# Patient Record
Sex: Female | Born: 1940 | Race: White | Hispanic: No | Smoking: Former smoker
Health system: Southern US, Community
[De-identification: ages and names within clinical notes are randomized; demographics above are authoritative.]

## PROBLEM LIST (undated history)

## (undated) DIAGNOSIS — E039 Hypothyroidism, unspecified: Secondary | ICD-10-CM

## (undated) DIAGNOSIS — C069 Malignant neoplasm of mouth, unspecified: Secondary | ICD-10-CM

## (undated) DIAGNOSIS — J449 Chronic obstructive pulmonary disease, unspecified: Secondary | ICD-10-CM

## (undated) DIAGNOSIS — L409 Psoriasis, unspecified: Secondary | ICD-10-CM

## (undated) DIAGNOSIS — E049 Nontoxic goiter, unspecified: Secondary | ICD-10-CM

## (undated) DIAGNOSIS — I10 Essential (primary) hypertension: Secondary | ICD-10-CM

## (undated) DIAGNOSIS — I350 Nonrheumatic aortic (valve) stenosis: Secondary | ICD-10-CM

## (undated) DIAGNOSIS — C801 Malignant (primary) neoplasm, unspecified: Secondary | ICD-10-CM

## (undated) DIAGNOSIS — M419 Scoliosis, unspecified: Secondary | ICD-10-CM

## (undated) DIAGNOSIS — Z973 Presence of spectacles and contact lenses: Secondary | ICD-10-CM

## (undated) DIAGNOSIS — R413 Other amnesia: Secondary | ICD-10-CM

## (undated) DIAGNOSIS — E079 Disorder of thyroid, unspecified: Secondary | ICD-10-CM

## (undated) HISTORY — DX: Psoriasis, unspecified: L40.9

## (undated) HISTORY — DX: Nontoxic goiter, unspecified: E04.9

## (undated) HISTORY — DX: Chronic obstructive pulmonary disease, unspecified: J44.9

## (undated) HISTORY — DX: Other amnesia: R41.3

## (undated) HISTORY — DX: Hypothyroidism, unspecified: E03.9

## (undated) HISTORY — DX: Disorder of thyroid, unspecified: E07.9

## (undated) HISTORY — DX: Scoliosis, unspecified: M41.9

## (undated) HISTORY — DX: Presence of spectacles and contact lenses: Z97.3

## (undated) HISTORY — PX: OTHER SURGICAL HISTORY: SHX169

## (undated) HISTORY — DX: Nonrheumatic aortic (valve) stenosis: I35.0

## (undated) HISTORY — DX: Malignant (primary) neoplasm, unspecified: C80.1

## (undated) HISTORY — DX: Essential (primary) hypertension: I10

## (undated) HISTORY — DX: Malignant neoplasm of mouth, unspecified: C06.9

---

## 1994-07-31 HISTORY — PX: MOUTH SURGERY: SHX715

## 1997-11-09 ENCOUNTER — Ambulatory Visit (HOSPITAL_COMMUNITY): Admission: RE | Admit: 1997-11-09 | Discharge: 1997-11-09 | Payer: Self-pay | Admitting: Otolaryngology

## 1999-05-18 ENCOUNTER — Other Ambulatory Visit: Admission: RE | Admit: 1999-05-18 | Discharge: 1999-05-18 | Payer: Self-pay | Admitting: Internal Medicine

## 2001-02-25 ENCOUNTER — Encounter (INDEPENDENT_AMBULATORY_CARE_PROVIDER_SITE_OTHER): Payer: Self-pay | Admitting: *Deleted

## 2001-02-25 ENCOUNTER — Ambulatory Visit (HOSPITAL_BASED_OUTPATIENT_CLINIC_OR_DEPARTMENT_OTHER): Admission: RE | Admit: 2001-02-25 | Discharge: 2001-02-25 | Payer: Self-pay | Admitting: Otolaryngology

## 2003-09-28 ENCOUNTER — Other Ambulatory Visit: Admission: RE | Admit: 2003-09-28 | Discharge: 2003-09-28 | Payer: Self-pay | Admitting: Internal Medicine

## 2003-11-30 ENCOUNTER — Ambulatory Visit (HOSPITAL_COMMUNITY): Admission: RE | Admit: 2003-11-30 | Discharge: 2003-11-30 | Payer: Self-pay | Admitting: *Deleted

## 2004-12-28 ENCOUNTER — Emergency Department (HOSPITAL_COMMUNITY): Admission: EM | Admit: 2004-12-28 | Discharge: 2004-12-29 | Payer: Self-pay | Admitting: Emergency Medicine

## 2005-01-01 ENCOUNTER — Inpatient Hospital Stay (HOSPITAL_COMMUNITY): Admission: AD | Admit: 2005-01-01 | Discharge: 2005-01-03 | Payer: Self-pay | Admitting: Gastroenterology

## 2006-11-08 ENCOUNTER — Other Ambulatory Visit: Admission: RE | Admit: 2006-11-08 | Discharge: 2006-11-08 | Payer: Self-pay | Admitting: Internal Medicine

## 2008-04-10 ENCOUNTER — Encounter: Admission: RE | Admit: 2008-04-10 | Discharge: 2008-04-10 | Payer: Self-pay | Admitting: Internal Medicine

## 2008-11-06 ENCOUNTER — Encounter: Admission: RE | Admit: 2008-11-06 | Discharge: 2008-11-06 | Payer: Self-pay | Admitting: Internal Medicine

## 2009-11-03 ENCOUNTER — Encounter: Admission: RE | Admit: 2009-11-03 | Discharge: 2009-11-03 | Payer: Self-pay | Admitting: Internal Medicine

## 2009-12-23 ENCOUNTER — Encounter: Admission: RE | Admit: 2009-12-23 | Discharge: 2009-12-23 | Payer: Self-pay | Admitting: Endocrinology

## 2009-12-23 ENCOUNTER — Other Ambulatory Visit: Admission: RE | Admit: 2009-12-23 | Discharge: 2009-12-23 | Payer: Self-pay | Admitting: Interventional Radiology

## 2010-06-02 ENCOUNTER — Other Ambulatory Visit: Admission: RE | Admit: 2010-06-02 | Discharge: 2010-06-02 | Payer: Self-pay | Admitting: Internal Medicine

## 2010-08-17 ENCOUNTER — Ambulatory Visit
Admission: RE | Admit: 2010-08-17 | Discharge: 2010-08-17 | Payer: Self-pay | Source: Home / Self Care | Attending: Specialist | Admitting: Specialist

## 2010-08-22 LAB — POCT I-STAT 4, (NA,K, GLUC, HGB,HCT)
Glucose, Bld: 105 mg/dL — ABNORMAL HIGH (ref 70–99)
Sodium: 142 mEq/L (ref 135–145)

## 2010-09-22 ENCOUNTER — Other Ambulatory Visit: Payer: Self-pay | Admitting: General Surgery

## 2010-09-22 DIAGNOSIS — E041 Nontoxic single thyroid nodule: Secondary | ICD-10-CM

## 2010-11-16 ENCOUNTER — Ambulatory Visit (HOSPITAL_BASED_OUTPATIENT_CLINIC_OR_DEPARTMENT_OTHER)
Admission: RE | Admit: 2010-11-16 | Discharge: 2010-11-16 | Disposition: A | Discharge status: Home / Self Care | Payer: 59 | Source: Ambulatory Visit | Attending: Specialist | Admitting: Specialist

## 2010-11-16 DIAGNOSIS — H52209 Unspecified astigmatism, unspecified eye: Secondary | ICD-10-CM | POA: Insufficient documentation

## 2010-11-16 DIAGNOSIS — H269 Unspecified cataract: Secondary | ICD-10-CM | POA: Insufficient documentation

## 2010-11-30 ENCOUNTER — Ambulatory Visit
Admission: RE | Admit: 2010-11-30 | Discharge: 2010-11-30 | Disposition: A | Discharge status: Home / Self Care | Payer: 59 | Source: Ambulatory Visit | Attending: General Surgery | Admitting: General Surgery

## 2010-11-30 DIAGNOSIS — E041 Nontoxic single thyroid nodule: Secondary | ICD-10-CM

## 2010-12-16 NOTE — Op Note (Signed)
[place]  Patient:    [name], [name]                          MRN: [id number] Proc. Date: 02/25/01 Attending:  Kristine Garbe. Ezzard Standing, M.D. CC:         Soyla Murphy. Renne Crigler, M.D.   Operative Report  PREOPERATIVE DIAGNOSIS:  Midline neck node.  POSTOPERATIVE DIAGNOSIS:  Midline neck node.  OPERATION PERFORMED:  Excision of lower midline neck node.  SURGEON:  Kristine Garbe. Ezzard Standing, M.D.  ANESTHESIA:  Local 1% Xylocaine with 1:200,000 epinephrine.  COMPLICATIONS:  None.  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  She has recently developed a nodule in the midline neck consistent with probable thyroid lymph node as it was inferior to the thyroid isthmus.  She is taken to the operating room at this time for excisional biopsy of the lower neck node. She has no other significant palpable adenopathy in the neck although she does have a small nodule at the thyroid isthmus.  DESCRIPTION OF PROCEDURE:  The neck was prepped with Betadine solution and the area of incision was injected with 3 cc of 1% Xylocaine with 1:200,000 epinephrine.  A 2 cm incision was made and the lymph node was deep to the platysmus muscle which was transected.  The node was then dissected out.  The blood supply was clamped, ligated with a 4-0 chromic suture.  The lymph node was incised and sent to pathology in saline.  The wound was then closed with 4-0 chromic suture subcutaneously and Steri-Strips on the skin.  The patient tolerated the procedure well.  DISPOSITION:  The patient will be discharged home later this morning and will follow up in my office on Friday to review pathology and recheck wound. DD:  02/25/01 TD:  02/25/01 Job: [id number]

## 2010-12-16 NOTE — Discharge Summary (Signed)
NAMEMINDA, FAAS NO.:  [id number]   MEDICAL RECORD NO.:  [id number]          PATIENT TYPE:  INP   LOCATION:  3008                         FACILITY:  [facility]   PHYSICIAN:  Georgiana Spinner, M.D.    DATE OF BIRTH:  [birth year]   DATE OF ADMISSION:  01/01/2005  DATE OF DISCHARGE:  01/03/2005                                 DISCHARGE SUMMARY   Ms. Sculley is a 70 year old lady admitted January 01, 2005, being discharged January 03, 2005.  Ms. Cocco is a very pleasant 70 year old lady who was admitted  with diarrhea.  The patient had diarrhea for approximately one week prior to  admission.  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  The diarrhea started  after she had eaten a meal out at a [employer].  She developed large-  volume diarrhea without hematochezia or pain.  She went to Urgent Care and  found to be orthostatic.  She was moved to the emergency room at [facility],  hydrated and discharged, and symptoms persisted and then she became  dehydrated once again and was admitted.   FAMILY HISTORY:  Noncontributory.   She no longer uses tobacco.   REVIEW OF SYSTEMS:  Unremarkable.   MEDICATIONS ON ADMISSION:  Synthroid, Azopt, multivitamins and fish oil.   She has no known drug allergies.   Her examination was unremarkable.  Abdomen was flat and soft and nontender  and nondistended.  She was treated with IV hydration.  She had been given  Imodium as an outpatient but not in the hospital but had no further diarrhea  after being admitted and no fever, chills or night sweats were noted, and  she was able to tolerate a regular diet without further diarrhea.  She had  flatus.  Abdomen remained soft, nontender, nondistended, and laboratory  studies showed at the time of discharge that CMP showed potassium was at  3.3, which was only slightly low given all her diarrhea.  SGOT was 51,  normal being up to  37.  Albumin was 2.9.  CBC showed a hematocrit of 37.5,  hemoglobin 12.7, white count 3500, with essentially a normal differential.  Platelet count was normal.  No radiologic studies were done at this time.  The patient is discharged in improved condition to be followed up on a  p.r.n. basis.      GMO/MEDQ  D:  01/03/2005  T:  01/03/2005  Job:  [id number]

## 2010-12-16 NOTE — Op Note (Signed)
NAMECARMELLE, BAMBERG                           ACCOUNT NO.:  [id number]   MEDICAL RECORD NO.:  [id number]                   PATIENT TYPE:  AMB   LOCATION:  ENDO                                 FACILITY:  [facility]   PHYSICIAN:  Georgiana Spinner, M.D.                 DATE OF BIRTH:  April 24, 1941   DATE OF PROCEDURE:  11/30/2003  DATE OF DISCHARGE:                                 OPERATIVE REPORT   PROCEDURE:  Colonoscopy.   INDICATION:  Colon cancer screening.   ANESTHESIA:  Demerol 70, Versed 7 mg.   PROCEDURE:  With the patient mildly sedated in the left lateral decubitus  position, an Olympus video colonoscope was inserted in the rectum, passed  under direct vision through a tortuous colon to the cecum, identified by  ileocecal valve and appendiceal orifice, both of which were photographed.  From this point, the colonoscope was slowly withdrawn, taking  circumferential views of the colonic mucosa, stopping only in the rectum  which appeared normal.  On direct, it showed internal hemorrhoids on  retroflexed view.  The endoscope was straightened and withdrawn.  The  patient's vital signs and pulse oximeter remained stable.  The patient  tolerated the procedure well with no apparent complications.   FINDINGS:  1. Some diverticulosis of sigmoid colon.  2. Internal hemorrhoids.  3. Otherwise unremarkable exam.   PLAN:  Repeat examination in 5-10 years.                                               Georgiana Spinner, M.D.    GMO/MEDQ  D:  11/30/2003  T:  11/30/2003  Job:  [id number]

## 2010-12-16 NOTE — H&P (Signed)
[name], [name]                 ACCOUNT NO.:  [id number]   MEDICAL RECORD NO.:  [id number]          PATIENT TYPE:  INP   LOCATION:  3008                         FACILITY:  [facility]   PHYSICIAN:  Jordan Hawks. Elnoria Howard, MD    DATE OF BIRTH:  03/03/1941   DATE OF ADMISSION:  01/01/2005  DATE OF DISCHARGE:                                HISTORY & PHYSICAL   REASON FOR ADMISSION:  Diarrhea, nausea, vomiting, and dehydration.   HISTORY OF PRESENT ILLNESS:  This is a 70 year old white female with a past  medical h o squamous cell carcinoma status post partial glossal resection,  hypothyroidism, and glaucoma, who was admitted for persistent diarrhea.  The  patient states that the diarrhea started acutely on Tuesday evening after  eating at [employer] with her son.  Incidentally, her son was not sick after  eating with his mother.  Subsequently the patient developed large-volume  diarrhea without hematochezia or pain, although the diarrhea persists even  if she fasts and will awaken her from sleep.  She was evaluated at [facility] on Wednesday and found to be extremely orthostatic with heart  rate of 122.  She was subsequently moved to the emergency room at [facility] and  hydrated.  After hydration, the patient was subsequently discharged home;  however, her symptoms never abated. She was also prescribed Reglan for  unknown reason at that brief visit.   The patient called today and was advised to be admitted, but she was  initially resistant with recommendation; however, despite her symptomatic  relief from the recommended Imodium, she felt weak and tired and felt to  require admission.  She denies any fevers, chills, foreign travel, sick  contacts, or recent antibiotic use.  Her prior colonoscopy by Dr. Virginia Rochester in  2005 was essentially negative per the patient.   PAST MEDICAL HISTORY AND PAST SURGICAL HISTORY:  As stated above.   FAMILY HISTORY:  Noncontributory.   SOCIAL HISTORY:  Prior tobacco  use.   REVIEW OF SYSTEMS:  As per the History of Present Illness.   MEDICATIONS:  Synthroid, Azoptic, multivitamins, and fish oil.   ALLERGIES:  No known drug allergies.   PHYSICAL EXAMINATION:  VITAL SIGNS:  Supine 120/80 with heart rate of 88;  standing after 2 minutes duration, 115/80 with heart rate at 106.  GENERAL:  The patient is in no acute distress, fatigued appearing.  HEENT:  Normocephalic and atraumatic.  Extraocular muscles intact.  Pupils  equal, round, and reactive to light.  NECK:  Supple, no lymphadenopathy.  LUNGS: Clear to auscultation bilaterally.  CARDIOVASCULAR:  Regular rate and rhythm, tachycardic.  Positive for 2/6  systolic ejection murmur.  ABDOMEN:  Flat, soft, nontender, nondistended.  No hepatosplenomegaly.  EXTREMITIES:  No clubbing, cyanosis, or edema.  RECTAL:  Heme negative and no palpable abnormality.   LABORATORY DATA:  Laboratory values are pending at this time   IMPRESSION:  Diarrhea of questionable etiology.  Per the patient's clinical  history, this could possibly be secretory in nature. She needs further  evaluation for possible infectious etiology.  PLAN:  1.  Aggressive IV hydration.  2.  Hold Imodium until stool studies are collected.  3.  Check for C. difficile, fecal leukocytes, stool potassium, stool sodium,      and stool osmolarity.  4.  Flexible sigmoidoscopy versus colonoscopy per Dr. Virginia Rochester for evaluation of      possible microscopic colitis.      PDH/MEDQ  D:  01/01/2005  T:  01/01/2005  Job:  [id number]   cc:   Georgiana Spinner, M.D.  [address]  [place]  [place] [address]  Fax: [phone]   Soyla Murphy. Renne Crigler, M.D.  [address]  [place]  [place] [address]  Fax: [phone]

## 2010-12-30 ENCOUNTER — Encounter (INDEPENDENT_AMBULATORY_CARE_PROVIDER_SITE_OTHER): Payer: Self-pay | Admitting: General Surgery

## 2011-02-24 NOTE — Op Note (Signed)
  NAMEKEYIRA, [name]                 ACCOUNT NO.:  [id number]  MEDICAL RECORD NO.:  [id number]          PATIENT TYPE:  LOCATION:                                 FACILITY:  PHYSICIAN:  Chucky May, M.D.  DATE OF BIRTH:  07-22-1941  DATE OF PROCEDURE:  11/16/2010 DATE OF DISCHARGE:                              OPERATIVE REPORT   PREOPERATIVE DIAGNOSES: 1. Cataract, left eye. 2. Astigmatism, left eye.  POSTOPERATIVE DIAGNOSES: 1. Cataract, left eye. 2. Astigmatism, left eye.  OPERATION PERFORMED:  Cataract extraction with intraocular lens implant with toric intraocular lens and limbal relaxing incision by LenSx laser, left eye.  SURGEON:  Chucky May, M.D.  ANESTHESIA:  MAC.  The outpatient setting is the appropriate setting for the surgical procedure.  DESCRIPTION OF PROCEDURE:  The patient was taken from the preoperative area and moved into position onto the LenSx laser.  Previously the 9 o'clock, 3 o'clock and 6 o'clock positions had been marked with a sterile marking pen from the cornea.  The patient was placed in the supine position.  There was no cyclotorsion noted.  The patient interface was placed over the eye and appropriate applanation obtained. A limbal relaxing incision of the 28 degrees arc was placed at axis 117 degrees.  Side port and primary incisions were additionally made by the laser.  A capsulorrhexis and emulsification of the nucleus of the lens was also performed by laser.  The patient was then taken to the main operating room where she was placed in the supine position.  Anesthesia was obtained by means of topical 4% lidocaine drops of tetracaine.  She was then prepped and draped in the usual manner.  A lid speculum was inserted and the cornea was entered through the previously made side port incision and viscoelastic was instilled to the anterior chamber. The primary incision was then opened and a capsulorrhexis flap made by the laser was  gently removed from the.  The nucleus was mobilized by hydrodissection followed by phacoemulsification of the nucleus and residual cortical material was removed by irrigation-aspiration.  The posterior capsule was polished.  Once the posterior capsule was polished, a posterior chamber lens implant model [id number] of 23.5 diopters was placed in the bag without difficulty.  The lens was then rotated into position so that its axis was aligned at 117 degrees.  Viscoelastic was then removed by irrigation and aspiration and replaced with Balanced Salt Solution.  The wounds were then hydrated with Balanced Salt Solution and checked for fluid leaks and none were noted.  The eye was dressed with topical Vigamox and Pred Forte as well as a Fox shield and the patient was taken to the recovery room in excellent condition where she received written and verbal instructions for postoperative care and was scheduled for followup in 24 hours.          ______________________________ Chucky May, M.D.     DJD/MEDQ  D:  11/16/2010  T:  11/17/2010  Job:  [id number]  Electronically Signed by Nelson Chimes M.D. on 02/24/2011 09:26:49 AM

## 2011-05-02 IMAGING — US US BIOPSY
1 series · 5 of 5 positions shown · non-contrast
Comparison: none

CLINICAL DATA: Patient with history of multiple thyroid nodules and
thyroid ultrasound at [HOSPITAL] on 11/03/2009 which
revealed a dominant nodule in the medial left mid pole measuring
1.6 x 0.6 x 0.7 cm.  A second dominant nodule in the right aspect
of the isthmus measures 1.4 x 0.5 x 1.0 cm. Request is now made for
needle aspirate biopsy of the above-mentioned dominant  thyroid
nodules.

ULTRASOUND-GUIDED NEEDLE ASPIRATE BIOPSY, DOMINANT RIGHT
THYROID/ISTHMUS NODULE
The above procedure was discussed with the patient and written
informed consent was obtained.

[Series 1: us biopsy · 5 acquisitions, 5 frames shown]
[im 1/5]
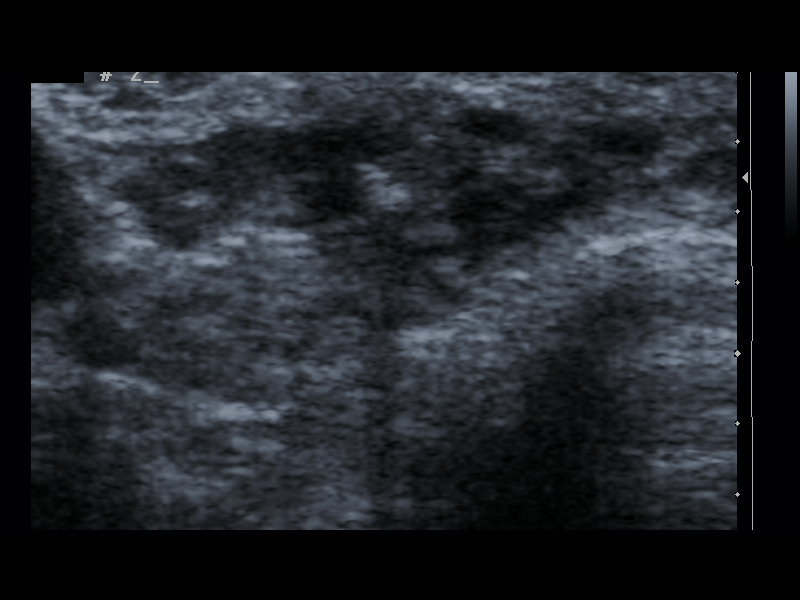
[im 2/5]
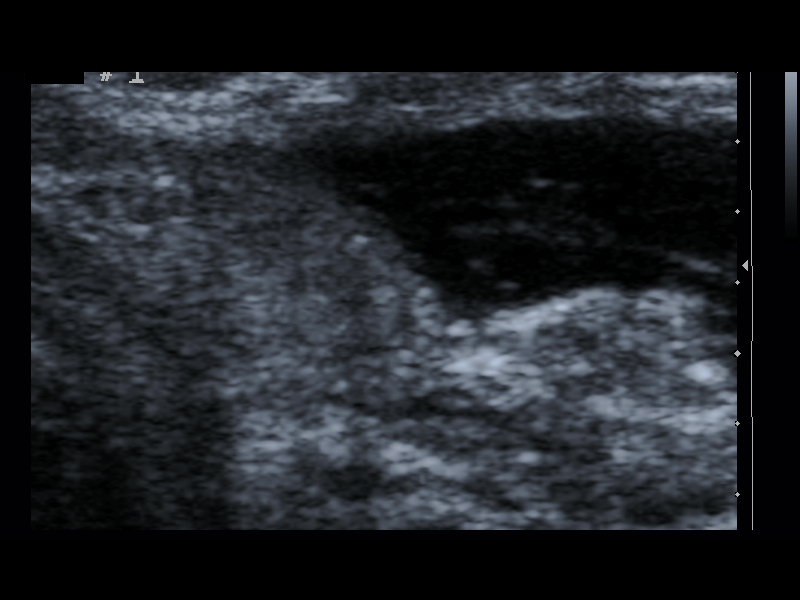
[im 3/5]
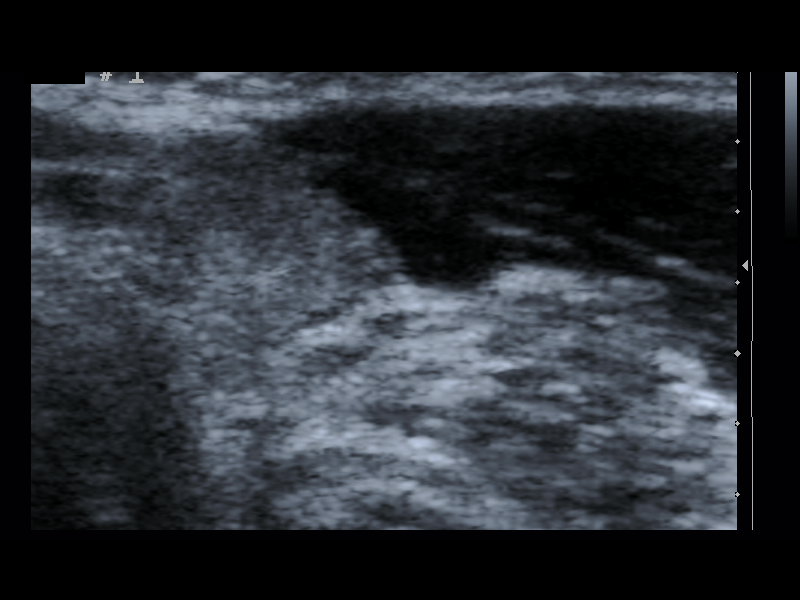
[im 4/5]
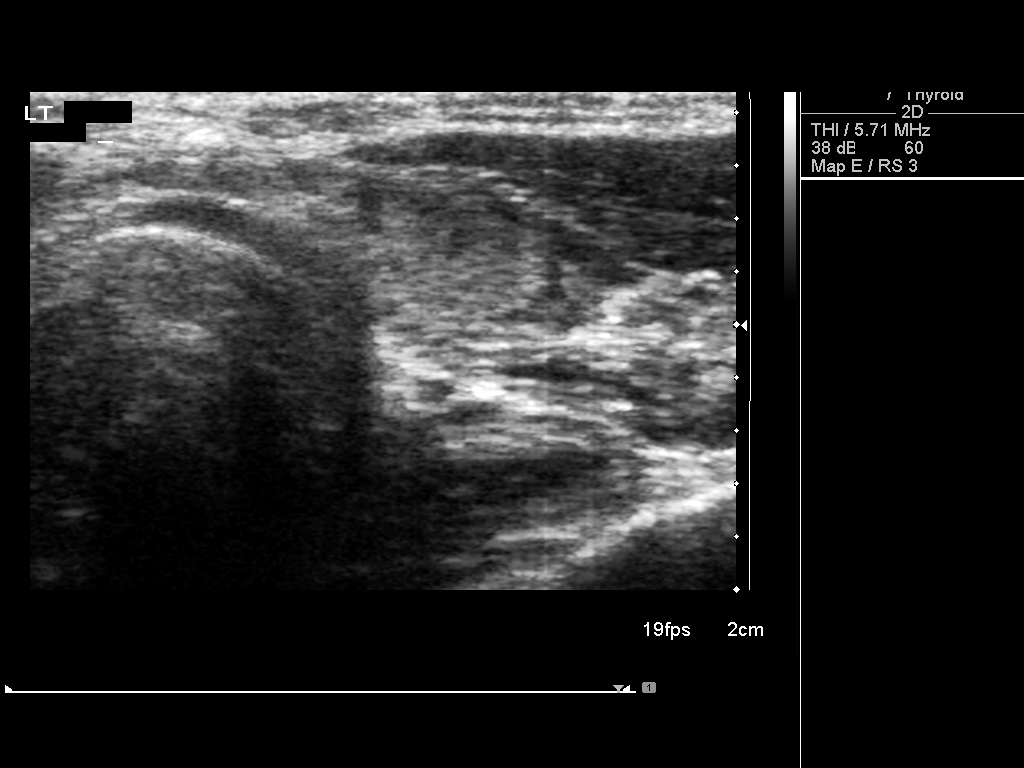
[im 5/5]
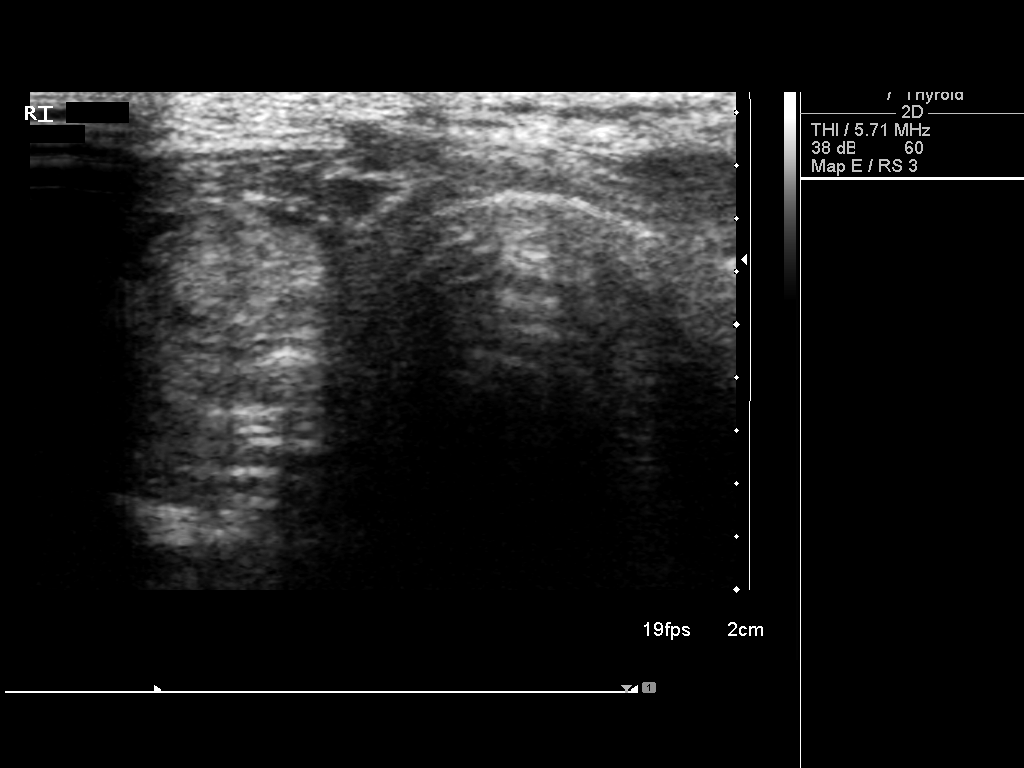

[5 of 5 positions shown; findings below may reference images not displayed]

FINDINGS: Ultrasound was performed to localize and mark an adequate
site for the biopsy.  The patient was then prepped and draped in a
normal sterile fashion.  Local anesthesia was provided with 1%
lidocaine.  Under direct ultrasound guidance, 3 passes were made
using 25 gauge needles into the dominant nodule within the right
lobe of the thyroid.  Ultrasound was used to confirm needle
placements on all occasions.  Specimens were sent to Pathology for
analysis.  Post procedural imaging demonstrated no hematoma or
immediate complication.  The patient tolerated the procedure well.

ULTRASOUND GUIDED NEEDLE ASPIRATE BIOPSY, DOMINANT LEFT MID THYROID
NODULE
FINDINGS: Ultrasound was performed to localize and mark an
adequate site for the biopsy.  The patient was then prepped and
draped in a normal sterile fashion.  Local anesthesia was provided
with 1% lidocaine.  Under direct ultrasound guidance three passes
were made using 25 gauge needles into the dominant nodule within
the left mid pole of the thyroid.  Ultrasound was used to confirm
needle placements on all occasions.  Specimens were sent to
pathology for analysis.  Postprocedural imaging demonstrated no
hematoma or immediate complication.  The patient tolerated
procedure well.
IMPRESSION: Successful ultrasound guided needle aspirate biopsies
of  dominant right and left thyroid nodules.  Final pathology
pending.

Read by: Aujla, Lesya.-DJELJAN

## 2011-11-08 ENCOUNTER — Encounter (INDEPENDENT_AMBULATORY_CARE_PROVIDER_SITE_OTHER): Payer: Self-pay | Admitting: General Surgery

## 2011-12-19 ENCOUNTER — Other Ambulatory Visit: Payer: Self-pay | Admitting: Dermatology

## 2012-01-01 ENCOUNTER — Encounter (INDEPENDENT_AMBULATORY_CARE_PROVIDER_SITE_OTHER): Payer: Self-pay | Admitting: General Surgery

## 2012-01-01 ENCOUNTER — Ambulatory Visit (INDEPENDENT_AMBULATORY_CARE_PROVIDER_SITE_OTHER): Payer: 59 | Admitting: General Surgery

## 2012-01-01 VITALS — BP 150/86 | HR 77 | Temp 99.7°F | Resp 14 | Ht 64.5 in | Wt 115.4 lb

## 2012-01-01 DIAGNOSIS — E042 Nontoxic multinodular goiter: Secondary | ICD-10-CM | POA: Insufficient documentation

## 2012-01-01 NOTE — Progress Notes (Signed)
Patient ID: [name], female   DOB: [birth year], 71 y.o.   MRN: [id number]  Chief Complaint  Patient presents with  . Follow-up    Thyroid needs repeat BX and u/s    HPI [name] is a 71 y.o. female.   HPI She is here for followup of her multiple thyroid nodules. She had biopsy of a nodule 2 years ago which did not show any follicular cells are suspicious lesions. There was some concern for possible thyroiditis. She had ultrasound 13 months ago that demonstrated no change in the size of her multiple thyroid nodules. She has no dysphagia or compressive symptoms  Past Medical History  Diagnosis Date  . Hypertension   . Thyroid disease   . Osteoporosis   . Glaucoma   . Wears glasses   . Cataract   . Cancer     scc of oral cavity    Past Surgical History  Procedure Date  . Mouth surgery 1996  . Cesarean section 1978    TWINS, ONE WAS STILLBORN  . Nodule removed     BENIGN, LOWER NECK    History reviewed. No pertinent family history.  Social History History  Substance Use Topics  . Smoking status: Never Smoker   . Smokeless tobacco: Never Used  . Alcohol Use: No    Allergies  Allergen Reactions  . Naphazoline-Polyethyl Glycol     THAT YOU USE TO DILATE YOUR EYES.Marland KitchenMAKES BP GO UP, CAN ONLY USE HALF STRENGTH    Current Outpatient Prescriptions  Medication Sig Dispense Refill  . Bimatoprost (LUMIGAN) 0.01 % SOLN Apply to eye.        . Calcium Carbonate-Vitamin D (CALTRATE 600+D PO) Take by mouth.        . Cholecalciferol (VITAMIN D-3 PO) Take 800 mg by mouth.        . dorzolamide-timolol (COSOPT) 22.3-6.8 MG/ML ophthalmic solution Place 1 drop into both eyes 2 (two) times daily.        Marland Kitchen levothyroxine (SYNTHROID, LEVOTHROID) 75 MCG tablet Take 75 mcg by mouth daily.        . metoprolol (LOPRESSOR) 50 MG tablet Take 50 mg by mouth 2 (two) times daily.        . metoprolol succinate (TOPROL-XL) 50 MG 24 hr tablet       . Multiple Vitamins-Minerals (CENTRUM SILVER  PO) Take by mouth.          Review of Systems Review of Systems  HENT: Negative for voice change.   Respiratory: Positive for cough.   Cardiovascular: Negative.   Gastrointestinal: Negative.     Blood pressure 150/86, pulse 77, temperature 99.7 F (37.6 C), temperature source Temporal, resp. rate 14, height 5' 4.5" (1.638 m), weight 115 lb 6.4 oz (52.345 kg).  Physical Exam Physical Exam  Constitutional: She appears well-developed and well-nourished. No distress.  HENT:  Head: Normocephalic and atraumatic.  Neck: Neck supple. No thyromegaly present.  Lymphadenopathy:    She has no cervical adenopathy.    Data Reviewed Old notes.  Assessment    Multiple thyroid nodules that are asymptomatic.    Plan    Repeat thyroid ultrasound.  If any of the nodules had changed, will arrange for ultrasound guided biopsy.  If there are no changes, will see her back in 12 months.      Eder Macek J 01/01/2012, 5:18 PM

## 2012-01-01 NOTE — Patient Instructions (Signed)
If you have not heard from [place] a week after your ultrasound, call and ask for [facility].

## 2012-01-02 ENCOUNTER — Other Ambulatory Visit (INDEPENDENT_AMBULATORY_CARE_PROVIDER_SITE_OTHER): Payer: Self-pay | Admitting: General Surgery

## 2012-01-02 ENCOUNTER — Telehealth (INDEPENDENT_AMBULATORY_CARE_PROVIDER_SITE_OTHER): Payer: Self-pay

## 2012-01-02 DIAGNOSIS — E042 Nontoxic multinodular goiter: Secondary | ICD-10-CM

## 2012-01-02 NOTE — Telephone Encounter (Signed)
LMOV pt's [place] scheduled for 01/11/12 at 1:00 pm at [facility].

## 2012-01-11 ENCOUNTER — Telehealth (INDEPENDENT_AMBULATORY_CARE_PROVIDER_SITE_OTHER): Payer: Self-pay

## 2012-01-11 ENCOUNTER — Ambulatory Visit
Admission: RE | Admit: 2012-01-11 | Discharge: 2012-01-11 | Disposition: A | Discharge status: Home / Self Care | Payer: 59 | Source: Ambulatory Visit | Attending: General Surgery | Admitting: General Surgery

## 2012-01-11 DIAGNOSIS — E042 Nontoxic multinodular goiter: Secondary | ICD-10-CM

## 2012-01-11 NOTE — Telephone Encounter (Signed)
LMOV for patient to call our office for her [place] results.

## 2012-01-25 ENCOUNTER — Telehealth (INDEPENDENT_AMBULATORY_CARE_PROVIDER_SITE_OTHER): Payer: Self-pay

## 2012-01-25 NOTE — Telephone Encounter (Signed)
Pt returned Dr. Maris Berger call from yesterday.  I gave her the results from her thyroid US, and said we would contact her in 1 year for follow up.

## 2012-04-24 DIAGNOSIS — M899 Disorder of bone, unspecified: Secondary | ICD-10-CM | POA: Diagnosis not present

## 2012-04-24 DIAGNOSIS — M949 Disorder of cartilage, unspecified: Secondary | ICD-10-CM | POA: Diagnosis not present

## 2012-04-30 DIAGNOSIS — R059 Cough, unspecified: Secondary | ICD-10-CM | POA: Diagnosis not present

## 2012-04-30 DIAGNOSIS — K219 Gastro-esophageal reflux disease without esophagitis: Secondary | ICD-10-CM | POA: Diagnosis not present

## 2012-04-30 DIAGNOSIS — J40 Bronchitis, not specified as acute or chronic: Secondary | ICD-10-CM | POA: Diagnosis not present

## 2012-04-30 DIAGNOSIS — R05 Cough: Secondary | ICD-10-CM | POA: Diagnosis not present

## 2012-04-30 DIAGNOSIS — I10 Essential (primary) hypertension: Secondary | ICD-10-CM | POA: Diagnosis not present

## 2012-05-08 DIAGNOSIS — M810 Age-related osteoporosis without current pathological fracture: Secondary | ICD-10-CM | POA: Diagnosis not present

## 2012-05-15 DIAGNOSIS — R05 Cough: Secondary | ICD-10-CM | POA: Diagnosis not present

## 2012-05-15 DIAGNOSIS — R059 Cough, unspecified: Secondary | ICD-10-CM | POA: Diagnosis not present

## 2012-05-15 DIAGNOSIS — E039 Hypothyroidism, unspecified: Secondary | ICD-10-CM | POA: Diagnosis not present

## 2012-05-15 DIAGNOSIS — Z23 Encounter for immunization: Secondary | ICD-10-CM | POA: Diagnosis not present

## 2012-05-15 DIAGNOSIS — M810 Age-related osteoporosis without current pathological fracture: Secondary | ICD-10-CM | POA: Diagnosis not present

## 2012-05-20 DIAGNOSIS — H4011X0 Primary open-angle glaucoma, stage unspecified: Secondary | ICD-10-CM | POA: Diagnosis not present

## 2012-05-20 DIAGNOSIS — H409 Unspecified glaucoma: Secondary | ICD-10-CM | POA: Diagnosis not present

## 2012-06-10 ENCOUNTER — Encounter: Payer: Self-pay | Admitting: Pulmonary Disease

## 2012-06-10 DIAGNOSIS — H409 Unspecified glaucoma: Secondary | ICD-10-CM | POA: Diagnosis not present

## 2012-06-10 DIAGNOSIS — H4011X0 Primary open-angle glaucoma, stage unspecified: Secondary | ICD-10-CM | POA: Diagnosis not present

## 2012-06-11 ENCOUNTER — Ambulatory Visit (INDEPENDENT_AMBULATORY_CARE_PROVIDER_SITE_OTHER): Payer: 59 | Admitting: Pulmonary Disease

## 2012-06-11 ENCOUNTER — Encounter: Payer: Self-pay | Admitting: Pulmonary Disease

## 2012-06-11 VITALS — BP 142/72 | HR 69 | Temp 98.1°F | Ht 66.0 in | Wt 115.4 lb

## 2012-06-11 DIAGNOSIS — R059 Cough, unspecified: Secondary | ICD-10-CM | POA: Diagnosis not present

## 2012-06-11 DIAGNOSIS — R053 Chronic cough: Secondary | ICD-10-CM

## 2012-06-11 DIAGNOSIS — R05 Cough: Secondary | ICD-10-CM | POA: Diagnosis not present

## 2012-06-11 NOTE — Assessment & Plan Note (Signed)
The patient has a chronic cough since December of last year, and it is difficult to tell, just coming from the upper versus lower airway.  She is at risk for significant reflux disease, and does have a lot of throat clearing.  However, she does produce purulent mucus at times, and has hyperinflation on chest x-ray.  Although she has never smoked, she has worked in an environment with strong solvents for many years, and has a body habitus that may be consistent with bronchiectasis.  At this point, I would like to treat her for postnasal drip and reflux disease, and see her back in 2 weeks with full pulmonary function studies.  I have also asked her to discontinue fish oil and Fosamax until we can evaluate her again.

## 2012-06-11 NOTE — Patient Instructions (Addendum)
Stop fish oil and fosamax until next visit. Would take omeprazole 40mg  one each am for next few weeks. Take chlorpheniramine 4mg   2 at bedtime until next visit. Will see you back in 2 weeks where we will do breathing studies.

## 2012-06-11 NOTE — Progress Notes (Signed)
  Subjective:    Patient ID: [name], female    DOB: Oct 27, 1940, 71 y.o.   MRN: [id number]  HPI The patient is a 71 year old female who had been asked to see for a chronic cough.  This started in December of last year, and has had "episodes" since that time.  These episodes are associated with thick and sometimes discolored mucus off and on, and she has been treated with an antibiotic once that did help.  The patient has a hard time distinguishing whether the mucus is coming from her throat or chest, but denies chest congestion or any shortness of breath issues.  She has been treated for reflux and thought the cough did improve, but even during this time she produced discolored mucus.  She denies any significant postnasal drip or history of recurrent sinus disease.  She does have frequent throat clearing.  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  She has never had pulmonary function studies.  The patient thinks that she is eating well and has not had a significant change in her weight.   Review of Systems  Constitutional: Negative for fever and unexpected weight change.  HENT: Positive for rhinorrhea. Negative for ear pain, nosebleeds, congestion, sore throat, sneezing, trouble swallowing, dental problem, postnasal drip and sinus pressure.   Eyes: Positive for redness and itching.  Respiratory: Positive for cough (  greenish in color ). Negative for chest tightness, shortness of breath and wheezing.   Cardiovascular: Negative for palpitations and leg swelling.  Gastrointestinal: Negative for nausea and vomiting.  Genitourinary: Negative for dysuria.  Musculoskeletal: Negative for joint swelling.  Skin: Negative for rash.  Neurological: Negative for headaches.  Hematological: Does not bruise/bleed easily.  Psychiatric/Behavioral: Negative for dysphoric mood. The patient  is not nervous/anxious.        Objective:   Physical Exam Constitutional:  Thin female, no acute distress  HENT:  Nares patent without discharge  Oropharynx without exudate, palate and uvula are normal  Eyes:  Perrla, eomi, no scleral icterus  Neck:  No JVD, no TMG  Cardiovascular:  Normal rate, regular rhythm, no rubs or gallops.  No murmurs        Intact distal pulses  Pulmonary :  Normal breath sounds, no stridor or respiratory distress   No rales, rhonchi, or wheezing  Abdominal:  Soft, nondistended, bowel sounds present.  No tenderness noted.   Musculoskeletal:  No lower extremity edema noted.  Lymph Nodes:  No cervical lymphadenopathy noted  Skin:  No cyanosis noted  Neurologic:  Alert, appropriate, moves all 4 extremities without obvious deficit.         Assessment & Plan:

## 2012-06-13 DIAGNOSIS — I10 Essential (primary) hypertension: Secondary | ICD-10-CM | POA: Diagnosis not present

## 2012-06-13 DIAGNOSIS — M899 Disorder of bone, unspecified: Secondary | ICD-10-CM | POA: Diagnosis not present

## 2012-06-13 DIAGNOSIS — E039 Hypothyroidism, unspecified: Secondary | ICD-10-CM | POA: Diagnosis not present

## 2012-06-13 DIAGNOSIS — M949 Disorder of cartilage, unspecified: Secondary | ICD-10-CM | POA: Diagnosis not present

## 2012-06-18 DIAGNOSIS — M810 Age-related osteoporosis without current pathological fracture: Secondary | ICD-10-CM | POA: Diagnosis not present

## 2012-06-18 DIAGNOSIS — Z1212 Encounter for screening for malignant neoplasm of rectum: Secondary | ICD-10-CM | POA: Diagnosis not present

## 2012-06-18 DIAGNOSIS — E039 Hypothyroidism, unspecified: Secondary | ICD-10-CM | POA: Diagnosis not present

## 2012-06-18 DIAGNOSIS — I10 Essential (primary) hypertension: Secondary | ICD-10-CM | POA: Diagnosis not present

## 2012-06-18 DIAGNOSIS — Z0000 Encounter for general adult medical examination without abnormal findings: Secondary | ICD-10-CM | POA: Diagnosis not present

## 2012-06-24 DIAGNOSIS — H4011X0 Primary open-angle glaucoma, stage unspecified: Secondary | ICD-10-CM | POA: Diagnosis not present

## 2012-07-05 ENCOUNTER — Other Ambulatory Visit: Payer: 59

## 2012-07-05 ENCOUNTER — Ambulatory Visit (INDEPENDENT_AMBULATORY_CARE_PROVIDER_SITE_OTHER): Payer: Medicare Other | Admitting: Pulmonary Disease

## 2012-07-05 ENCOUNTER — Encounter: Payer: Self-pay | Admitting: Pulmonary Disease

## 2012-07-05 VITALS — BP 122/78 | HR 66 | Temp 98.0°F | Ht 66.0 in | Wt 116.0 lb

## 2012-07-05 DIAGNOSIS — J449 Chronic obstructive pulmonary disease, unspecified: Secondary | ICD-10-CM

## 2012-07-05 DIAGNOSIS — R053 Chronic cough: Secondary | ICD-10-CM

## 2012-07-05 DIAGNOSIS — R059 Cough, unspecified: Secondary | ICD-10-CM | POA: Diagnosis not present

## 2012-07-05 DIAGNOSIS — R05 Cough: Secondary | ICD-10-CM | POA: Diagnosis not present

## 2012-07-05 LAB — PULMONARY FUNCTION TEST

## 2012-07-05 NOTE — Assessment & Plan Note (Addendum)
The patient is been found to have moderate airflow obstruction on her PFTs.  It is unclear if this is the cause of her cough, but we'll treat her for the COPD and see if things improve.  She has very little smoking history, nor does she have a history that would suggest asthma.  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  We'll also check an alpha one antitrypsin for completeness.  If her cough does not improve on an inhaler regimen, we'll check a CT chest to evaluate for bronchiectasis.

## 2012-07-05 NOTE — Progress Notes (Signed)
  Subjective:    Patient ID: [name], female    DOB: [birth year], 71 y.o.   MRN: [id number]  HPI Patient comes in today for followup after her recent PFTs.  At the last visit, she was having a chronic cough, and it was unclear how much was 2 to an upper airway issues versus lower.  She was started on a proton pump inhibitor and antihistamine, and set up for pulmonary function studies.  She was found to have moderate airflow obstruction, with an FEV1 percent of 61.  She had no restriction and a normal diffusion capacity.   Review of Systems  Constitutional: Negative for fever and unexpected weight change.  HENT: Positive for rhinorrhea. Negative for ear pain, nosebleeds, congestion, sore throat, sneezing, trouble swallowing, dental problem, postnasal drip and sinus pressure.   Eyes: Negative for redness and itching.  Respiratory: Positive for cough. Negative for chest tightness, shortness of breath and wheezing.   Cardiovascular: Negative for palpitations and leg swelling.  Gastrointestinal: Negative for nausea and vomiting.  Genitourinary: Negative for dysuria.  Musculoskeletal: Negative for joint swelling.  Skin: Negative for rash.  Neurological: Negative for headaches.  Hematological: Does not bruise/bleed easily.  Psychiatric/Behavioral: Positive for dysphoric mood. The patient is not nervous/anxious.        Objective:   Physical Exam Thin female in no acute distress Nose without purulence or discharge noted Oropharynx clear Chest with mild decrease in breath sounds, no wheezing Cardiac exam is regular rate and rhythm Lower extremities without edema, cyanosis Alert and oriented, moves all 4 extremities.       Assessment & Plan:

## 2012-07-05 NOTE — Patient Instructions (Addendum)
Ok to restart fish oil and fosamax, but let me know if cough worsens. Can take omeprazole and chlorpheniramine as needed at this point. Will start on symbicort 160/4.5  2 inhalations each am and pm everyday.  Rinse mouth very well after using. Will check a blood test today to evaluate for possible inherited form of emphysema.  Will call you with results. If your cough continues, would do scan of chest to evaluate for bronchiectasis Please call me in 4 weeks to give me update on your cough and symptoms.

## 2012-07-05 NOTE — Progress Notes (Signed)
PFT Done. Angelmarie Ponzo, CMA  

## 2012-07-05 NOTE — Assessment & Plan Note (Signed)
The patient's cough is mildly improved with avoidance of upper airway irritants, and also treating empirically with a proton pump inhibitor and antihistamine.  Because she has not seen a significant change, and she was found to have airflow obstruction on PFTs, I will discontinue these medications for now and see how she responds to a bronchodilator regimen.

## 2012-07-10 ENCOUNTER — Encounter: Payer: Self-pay | Admitting: Pulmonary Disease

## 2012-07-15 ENCOUNTER — Telehealth: Payer: Self-pay | Admitting: Pulmonary Disease

## 2012-07-15 NOTE — Telephone Encounter (Signed)
ATC x2.  Line busy.  WCB. 

## 2012-07-15 NOTE — Telephone Encounter (Signed)
KC pls advise on Alpha 1 results from 07/05/12.

## 2012-07-15 NOTE — Telephone Encounter (Signed)
Let her know is totally normal.

## 2012-07-15 NOTE — Telephone Encounter (Signed)
Pt is aware of results and verbalized understanding.

## 2012-07-25 ENCOUNTER — Encounter: Payer: Self-pay | Admitting: Pulmonary Disease

## 2012-07-30 DIAGNOSIS — E039 Hypothyroidism, unspecified: Secondary | ICD-10-CM | POA: Diagnosis not present

## 2012-08-12 ENCOUNTER — Telehealth: Payer: Self-pay | Admitting: Pulmonary Disease

## 2012-08-12 MED ORDER — BUDESONIDE-FORMOTEROL FUMARATE 160-4.5 MCG/ACT IN AERO: 2.0000 | INHALATION_SPRAY | Freq: Two times a day (BID) | RESPIRATORY_TRACT | Status: DC

## 2012-08-12 NOTE — Telephone Encounter (Signed)
Let pt know that I am glad she is better.  Stay on symbicort as well as the other recommendations, and would like to see her in 3-4 mos to check on progress.

## 2012-08-12 NOTE — Telephone Encounter (Signed)
I spoke with pt. She was calling to give update on symbicort. She stated it has helped with the cough (about 50-60% better). She stated she is still having to clear her throat but is not like this all the time. Breathing is doing fine. Taking good deep breathes. Please advise KC thanks  --pt is fine with a call back tomorrow.

## 2012-08-12 NOTE — Telephone Encounter (Signed)
Pt aware. She needed nothing further

## 2012-08-12 NOTE — Telephone Encounter (Signed)
lmomtcb x1 for pt 

## 2012-08-16 DIAGNOSIS — E039 Hypothyroidism, unspecified: Secondary | ICD-10-CM | POA: Diagnosis not present

## 2012-08-16 DIAGNOSIS — E049 Nontoxic goiter, unspecified: Secondary | ICD-10-CM | POA: Diagnosis not present

## 2012-08-19 ENCOUNTER — Other Ambulatory Visit: Payer: Self-pay | Admitting: Endocrinology

## 2012-08-19 DIAGNOSIS — E049 Nontoxic goiter, unspecified: Secondary | ICD-10-CM

## 2012-08-20 ENCOUNTER — Ambulatory Visit
Admission: RE | Admit: 2012-08-20 | Discharge: 2012-08-20 | Disposition: A | Discharge status: Home / Self Care | Payer: Medicare Other | Source: Ambulatory Visit | Attending: Endocrinology | Admitting: Endocrinology

## 2012-08-20 DIAGNOSIS — E049 Nontoxic goiter, unspecified: Secondary | ICD-10-CM

## 2012-08-20 DIAGNOSIS — E042 Nontoxic multinodular goiter: Secondary | ICD-10-CM | POA: Diagnosis not present

## 2012-09-09 ENCOUNTER — Other Ambulatory Visit: Payer: Self-pay | Admitting: Internal Medicine

## 2012-09-09 DIAGNOSIS — E049 Nontoxic goiter, unspecified: Secondary | ICD-10-CM

## 2012-09-19 DIAGNOSIS — H4011X0 Primary open-angle glaucoma, stage unspecified: Secondary | ICD-10-CM | POA: Diagnosis not present

## 2012-09-19 DIAGNOSIS — H409 Unspecified glaucoma: Secondary | ICD-10-CM | POA: Diagnosis not present

## 2012-10-31 ENCOUNTER — Ambulatory Visit (INDEPENDENT_AMBULATORY_CARE_PROVIDER_SITE_OTHER): Payer: 59 | Admitting: Pulmonary Disease

## 2012-10-31 ENCOUNTER — Encounter: Payer: Self-pay | Admitting: Pulmonary Disease

## 2012-10-31 VITALS — BP 132/80 | HR 59 | Temp 97.4°F | Ht 66.0 in | Wt 116.8 lb

## 2012-10-31 DIAGNOSIS — J449 Chronic obstructive pulmonary disease, unspecified: Secondary | ICD-10-CM | POA: Diagnosis not present

## 2012-10-31 NOTE — Assessment & Plan Note (Signed)
The patient is doing much better since being on symbicort, and denies any significant airway symptoms and has seen improvement in her breathing.  She has not had any significant cough since being on the inhaler consistently.  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  I've also asked her to stay as active as possible.

## 2012-10-31 NOTE — Progress Notes (Signed)
  Subjective:    Patient ID: [name], female    DOB: Sep 22, 1940, 72 y.o.   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  She was started on symbicort recently, and has seen significant improvement in her airway symptoms and cough.  She is also seen some improvement in her breathing.  She has had some hoarseness and dysphonia, but believes this is secondary to postnasal drip.  I have reviewed with her again how to rinse after using the symbicort.   Review of Systems  Constitutional: Negative for fever and unexpected weight change.  HENT: Positive for rhinorrhea and voice change. Negative for ear pain, nosebleeds, congestion, sore throat, sneezing, trouble swallowing, dental problem, postnasal drip and sinus pressure.   Eyes: Negative for redness and itching.  Respiratory: Positive for cough. Negative for chest tightness, shortness of breath and wheezing.   Cardiovascular: Negative for palpitations and leg swelling.  Gastrointestinal: Negative for nausea and vomiting.  Genitourinary: Negative for dysuria.  Musculoskeletal: Negative for joint swelling.  Skin: Negative for rash.  Neurological: Negative for headaches.  Hematological: Does not bruise/bleed easily.  Psychiatric/Behavioral: Negative for dysphoric mood. The patient is not nervous/anxious.        Objective:   Physical Exam Thin female in no acute distress Nose without purulent discharge noted Neck without lymphadenopathy or thyromegaly Chest totally clear to auscultation, no wheezing Cardiac exam with regular rate and rhythm Lower extremities without edema, no cyanosis Alert and oriented, moves all 4 extremities.       Assessment & Plan:

## 2012-10-31 NOTE — Patient Instructions (Addendum)
Stay on symbicort twice a day, and rinse well after each use. Can take zyrtec 10mg  each day for postnasal drip/allergy symptoms if having. followup with me one more time in 6mos, then we will spread out to yearly .

## 2012-11-01 ENCOUNTER — Telehealth (INDEPENDENT_AMBULATORY_CARE_PROVIDER_SITE_OTHER): Payer: Self-pay

## 2012-11-01 DIAGNOSIS — Z961 Presence of intraocular lens: Secondary | ICD-10-CM | POA: Diagnosis not present

## 2012-11-01 DIAGNOSIS — H4011X0 Primary open-angle glaucoma, stage unspecified: Secondary | ICD-10-CM | POA: Diagnosis not present

## 2012-11-01 DIAGNOSIS — H40009 Preglaucoma, unspecified, unspecified eye: Secondary | ICD-10-CM | POA: Diagnosis not present

## 2012-11-01 DIAGNOSIS — H409 Unspecified glaucoma: Secondary | ICD-10-CM | POA: Diagnosis not present

## 2012-11-01 NOTE — Telephone Encounter (Signed)
Pt was contacted to schedule her LTFU with Dr. Abbey Chatters.  She is being followed by her PCP, Dr. Renne Crigler.

## 2012-11-25 DIAGNOSIS — Z1231 Encounter for screening mammogram for malignant neoplasm of breast: Secondary | ICD-10-CM | POA: Diagnosis not present

## 2012-11-29 DIAGNOSIS — H402290 Chronic angle-closure glaucoma, unspecified eye, stage unspecified: Secondary | ICD-10-CM | POA: Diagnosis not present

## 2012-11-29 DIAGNOSIS — H40009 Preglaucoma, unspecified, unspecified eye: Secondary | ICD-10-CM | POA: Diagnosis not present

## 2012-11-29 DIAGNOSIS — H4050X0 Glaucoma secondary to other eye disorders, unspecified eye, stage unspecified: Secondary | ICD-10-CM | POA: Insufficient documentation

## 2012-11-29 DIAGNOSIS — H409 Unspecified glaucoma: Secondary | ICD-10-CM | POA: Diagnosis not present

## 2012-11-29 DIAGNOSIS — H21509 Unspecified adhesions of iris and ciliary body, unspecified eye: Secondary | ICD-10-CM | POA: Insufficient documentation

## 2013-02-26 DIAGNOSIS — H4011X0 Primary open-angle glaucoma, stage unspecified: Secondary | ICD-10-CM | POA: Diagnosis not present

## 2013-02-26 DIAGNOSIS — Z961 Presence of intraocular lens: Secondary | ICD-10-CM | POA: Diagnosis not present

## 2013-02-26 DIAGNOSIS — H40009 Preglaucoma, unspecified, unspecified eye: Secondary | ICD-10-CM | POA: Diagnosis not present

## 2013-02-26 DIAGNOSIS — H402290 Chronic angle-closure glaucoma, unspecified eye, stage unspecified: Secondary | ICD-10-CM | POA: Diagnosis not present

## 2013-03-10 ENCOUNTER — Ambulatory Visit
Admission: RE | Admit: 2013-03-10 | Discharge: 2013-03-10 | Disposition: A | Discharge status: Home / Self Care | Payer: Medicare Other | Source: Ambulatory Visit | Attending: Internal Medicine | Admitting: Internal Medicine

## 2013-03-10 DIAGNOSIS — E049 Nontoxic goiter, unspecified: Secondary | ICD-10-CM

## 2013-03-10 DIAGNOSIS — E042 Nontoxic multinodular goiter: Secondary | ICD-10-CM | POA: Diagnosis not present

## 2013-05-02 ENCOUNTER — Encounter: Payer: Self-pay | Admitting: Pulmonary Disease

## 2013-05-02 ENCOUNTER — Ambulatory Visit (INDEPENDENT_AMBULATORY_CARE_PROVIDER_SITE_OTHER): Payer: Medicare Other | Admitting: Pulmonary Disease

## 2013-05-02 VITALS — BP 144/78 | HR 76 | Temp 97.8°F

## 2013-05-02 DIAGNOSIS — J449 Chronic obstructive pulmonary disease, unspecified: Secondary | ICD-10-CM

## 2013-05-02 NOTE — Progress Notes (Signed)
  Subjective:    Patient ID: [name], female    DOB: [birth year], 72 y.o.   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  She has done well on this symbicort since the last visit, and has not had any acute symptoms or flare.  She denies any significant cough, but does have some early in the morning from postnasal drip.  she feels that her breathing has been excellent.   Review of Systems  Constitutional: Negative for fever and unexpected weight change.  HENT: Positive for rhinorrhea and postnasal drip. Negative for ear pain, nosebleeds, congestion, sore throat, sneezing, trouble swallowing, dental problem and sinus pressure.   Eyes: Negative for redness and itching.  Respiratory: Positive for cough. Negative for chest tightness, shortness of breath and wheezing.   Cardiovascular: Negative for palpitations and leg swelling.  Gastrointestinal: Negative for nausea and vomiting.  Genitourinary: Negative for dysuria.  Musculoskeletal: Negative for joint swelling.  Skin: Negative for rash.  Neurological: Negative for headaches.  Hematological: Does not bruise/bleed easily.  Psychiatric/Behavioral: Negative for dysphoric mood. The patient is not nervous/anxious.        Objective:   Physical Exam Thin female in no Acute distress Neck without lymphadenopathy or thyromegaly Nose without purulence or discharge noted Chest totally clear to auscultation, no wheezing Cardiac exam with regular rate and rhythm Lower extremities without edema, no cyanosis Alert and oriented, moves all 4 extremities.       Assessment & Plan:

## 2013-05-02 NOTE — Assessment & Plan Note (Signed)
The patient feels that she is doing well from a breathing standpoint on her current regimen, and I have asked her to continue this.  I have also stressed the importance of an aggressive exercise program to maintain her functional status.  If she is doing well, I will see her back in one year.

## 2013-05-02 NOTE — Patient Instructions (Addendum)
Continue on symbicort, and will give you a rescue inhaler to use in the event of emergencies.  Proair 2 inhalations every 6 hrs if needed for rescue Stay as active as possible. Make sure you get your flu shot with Dr. Renne Crigler. followup with me in one year, but call if doing well.

## 2013-06-17 DIAGNOSIS — I10 Essential (primary) hypertension: Secondary | ICD-10-CM | POA: Diagnosis not present

## 2013-06-17 DIAGNOSIS — E039 Hypothyroidism, unspecified: Secondary | ICD-10-CM | POA: Diagnosis not present

## 2013-06-17 DIAGNOSIS — E049 Nontoxic goiter, unspecified: Secondary | ICD-10-CM | POA: Diagnosis not present

## 2013-06-17 DIAGNOSIS — M810 Age-related osteoporosis without current pathological fracture: Secondary | ICD-10-CM | POA: Diagnosis not present

## 2013-06-17 DIAGNOSIS — Z0000 Encounter for general adult medical examination without abnormal findings: Secondary | ICD-10-CM | POA: Diagnosis not present

## 2013-06-19 ENCOUNTER — Telehealth: Payer: Self-pay | Admitting: Pulmonary Disease

## 2013-06-19 NOTE — Telephone Encounter (Signed)
I spoke with pt. She reports she received a letter from [facility] and was advised the symbicort will not be covered as of 07/31/2013. The alternative is advair. Please advise KC if this is appropriate to change as of January? thanks

## 2013-06-20 NOTE — Telephone Encounter (Signed)
The 100/5 strength, 2 puffs bid

## 2013-06-20 NOTE — Telephone Encounter (Signed)
lmomtcb x1 for pt 

## 2013-06-20 NOTE — Telephone Encounter (Signed)
advair is fine if that is the only option, but also check to see if dulera may be offered at the same tier level.  If it is, would rather her be on this.

## 2013-06-20 NOTE — Telephone Encounter (Signed)
Pt called back and she stated that the dulera was the other alternative.  So the pt will call back before the first of the year so we may send this to her pharmacy.  Will forward back to [facility] to make him aware and to find out which dulera you would like to start her on?  Thanks  Allergies  Allergen Reactions  . Naphazoline-Polyethyl Glycol     THAT YOU USE TO DILATE YOUR EYES.Marland KitchenMAKES BP GO UP, CAN ONLY USE HALF STRENGTH

## 2013-06-23 NOTE — Telephone Encounter (Signed)
LMTCBx2. Randie Bloodgood, CMA  

## 2013-06-23 NOTE — Telephone Encounter (Signed)
I spoke with the pt and she states she does not need any medication at this time. She has some symbicort left that she wants to use up. She states it will be after the new year when she will need an rx for dulera. I advised I will place this med on her med list so when it comes time to send in a rx it will be on her list. I advised her to call when she needs a refill. Carron Curie, CMA

## 2013-06-24 DIAGNOSIS — L570 Actinic keratosis: Secondary | ICD-10-CM | POA: Diagnosis not present

## 2013-06-24 DIAGNOSIS — Z1212 Encounter for screening for malignant neoplasm of rectum: Secondary | ICD-10-CM | POA: Diagnosis not present

## 2013-06-24 DIAGNOSIS — I10 Essential (primary) hypertension: Secondary | ICD-10-CM | POA: Diagnosis not present

## 2013-06-24 DIAGNOSIS — M810 Age-related osteoporosis without current pathological fracture: Secondary | ICD-10-CM | POA: Diagnosis not present

## 2013-06-24 DIAGNOSIS — E039 Hypothyroidism, unspecified: Secondary | ICD-10-CM | POA: Diagnosis not present

## 2013-06-24 DIAGNOSIS — C4492 Squamous cell carcinoma of skin, unspecified: Secondary | ICD-10-CM | POA: Diagnosis not present

## 2013-08-18 DIAGNOSIS — E039 Hypothyroidism, unspecified: Secondary | ICD-10-CM | POA: Diagnosis not present

## 2013-08-18 DIAGNOSIS — E049 Nontoxic goiter, unspecified: Secondary | ICD-10-CM | POA: Diagnosis not present

## 2013-08-19 ENCOUNTER — Other Ambulatory Visit: Payer: Self-pay | Admitting: Endocrinology

## 2013-08-19 DIAGNOSIS — E049 Nontoxic goiter, unspecified: Secondary | ICD-10-CM

## 2013-08-29 DIAGNOSIS — H409 Unspecified glaucoma: Secondary | ICD-10-CM | POA: Diagnosis not present

## 2013-08-29 DIAGNOSIS — H4010X0 Unspecified open-angle glaucoma, stage unspecified: Secondary | ICD-10-CM | POA: Diagnosis not present

## 2013-09-02 DIAGNOSIS — H4010X0 Unspecified open-angle glaucoma, stage unspecified: Secondary | ICD-10-CM | POA: Insufficient documentation

## 2013-09-10 DIAGNOSIS — H4011X0 Primary open-angle glaucoma, stage unspecified: Secondary | ICD-10-CM | POA: Diagnosis not present

## 2013-09-10 DIAGNOSIS — H4010X0 Unspecified open-angle glaucoma, stage unspecified: Secondary | ICD-10-CM | POA: Diagnosis not present

## 2013-09-10 DIAGNOSIS — Z961 Presence of intraocular lens: Secondary | ICD-10-CM | POA: Diagnosis not present

## 2013-09-10 DIAGNOSIS — H40009 Preglaucoma, unspecified, unspecified eye: Secondary | ICD-10-CM | POA: Diagnosis not present

## 2013-10-09 DIAGNOSIS — H4010X0 Unspecified open-angle glaucoma, stage unspecified: Secondary | ICD-10-CM | POA: Diagnosis not present

## 2013-10-09 DIAGNOSIS — H409 Unspecified glaucoma: Secondary | ICD-10-CM | POA: Diagnosis not present

## 2013-11-27 DIAGNOSIS — Z1231 Encounter for screening mammogram for malignant neoplasm of breast: Secondary | ICD-10-CM | POA: Diagnosis not present

## 2013-12-24 DIAGNOSIS — L538 Other specified erythematous conditions: Secondary | ICD-10-CM | POA: Diagnosis not present

## 2013-12-24 DIAGNOSIS — I10 Essential (primary) hypertension: Secondary | ICD-10-CM | POA: Diagnosis not present

## 2013-12-28 IMAGING — US US SOFT TISSUE HEAD/NECK
1 series · 13 of 25 positions shown · non-contrast
Comparison: Ultrasound of the thyroid of 01/11/2012

CLINICAL DATA: Follow up thyroid nodules, the patient is on thyroid
medication

THYROID ULTRASOUND
TECHNIQUE: Ultrasound examination of the thyroid gland and adjacent
soft tissues was performed.

[Series 1: us soft tissue head/neck · 0.06mm/px · 13 of 82 slices shown]
[im 1/82]
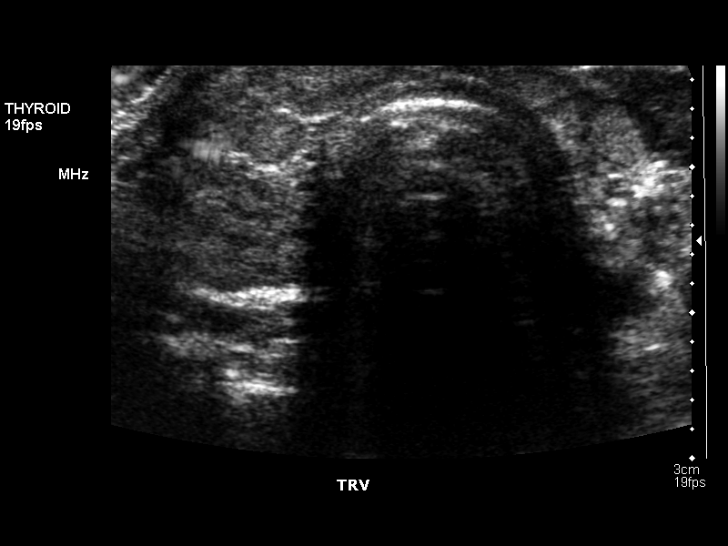
[im 7/82]
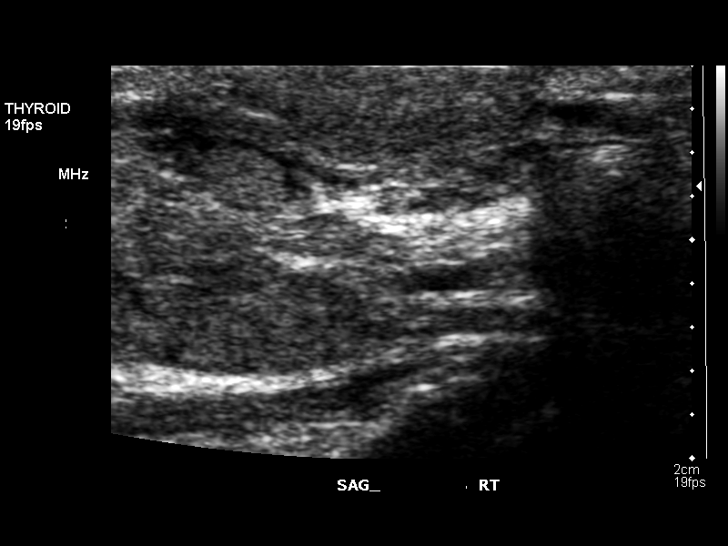
[im 14/82]
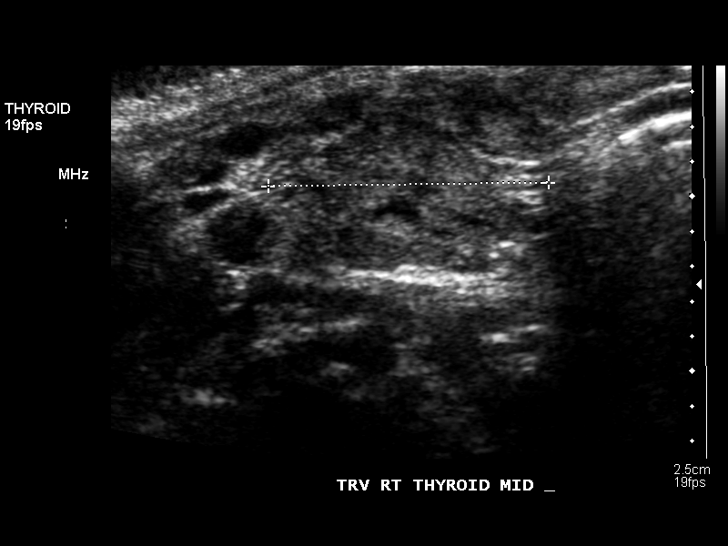
[im 21/82]
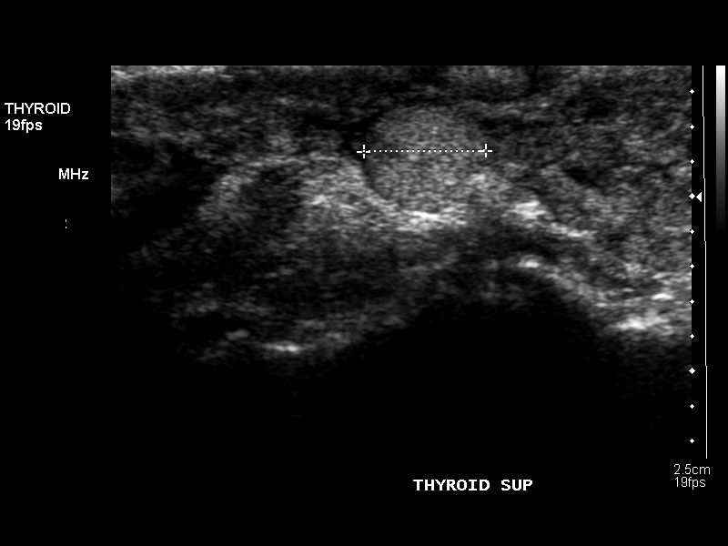
[im 28/82]
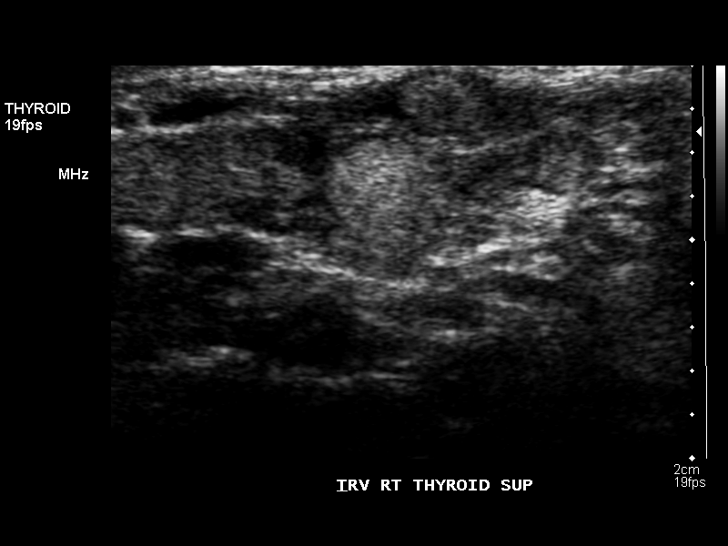
[im 34/82]
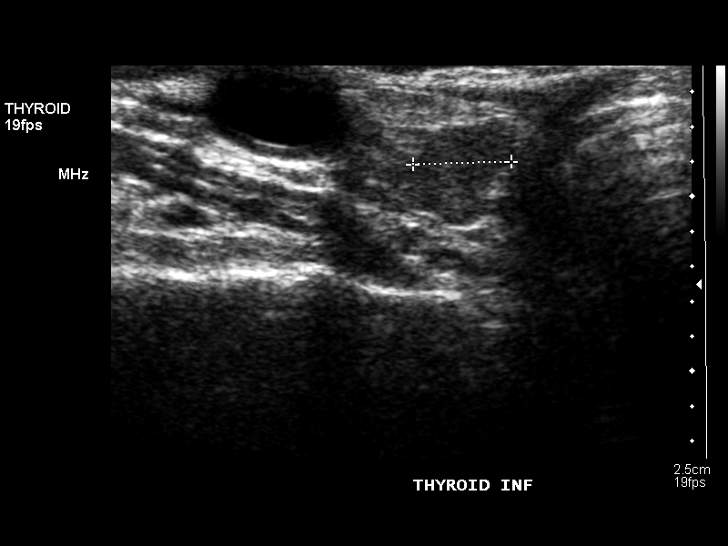
[im 41/82]
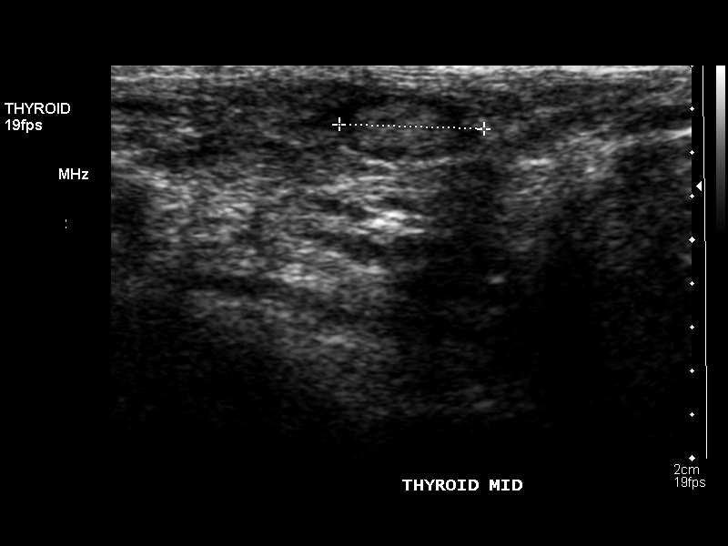
[im 48/82]
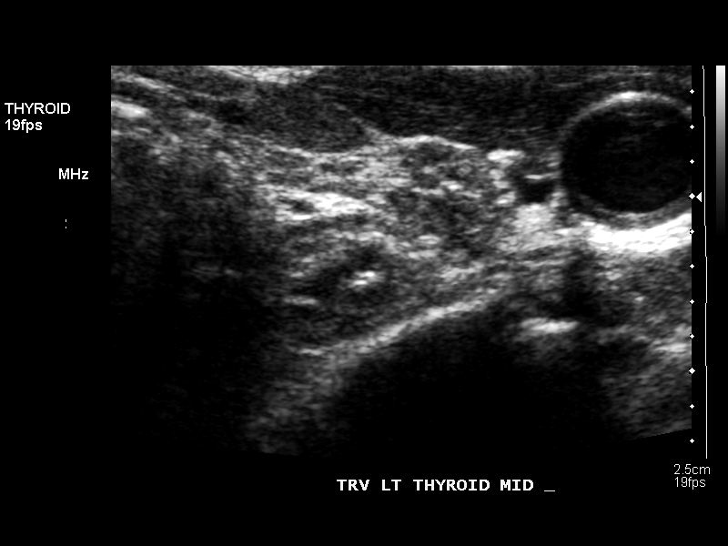
[im 55/82]
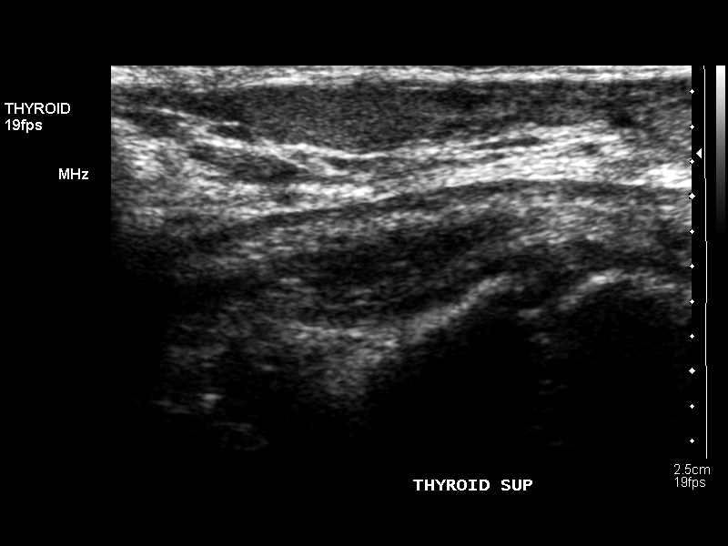
[im 61/82]
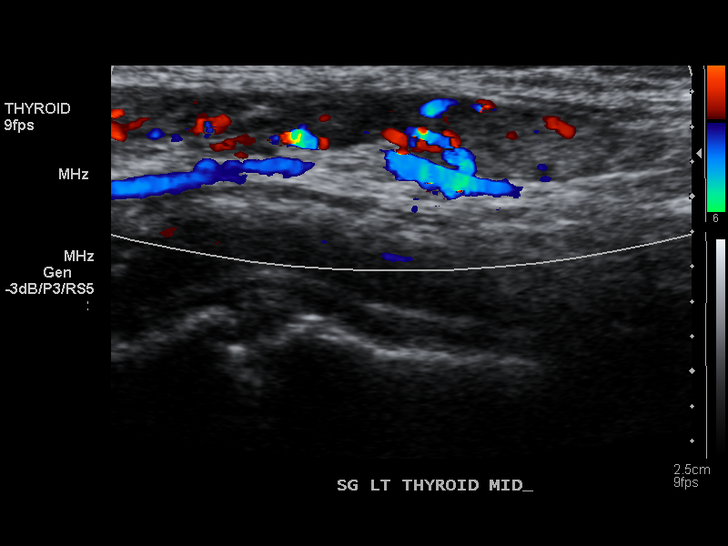
[im 68/82]
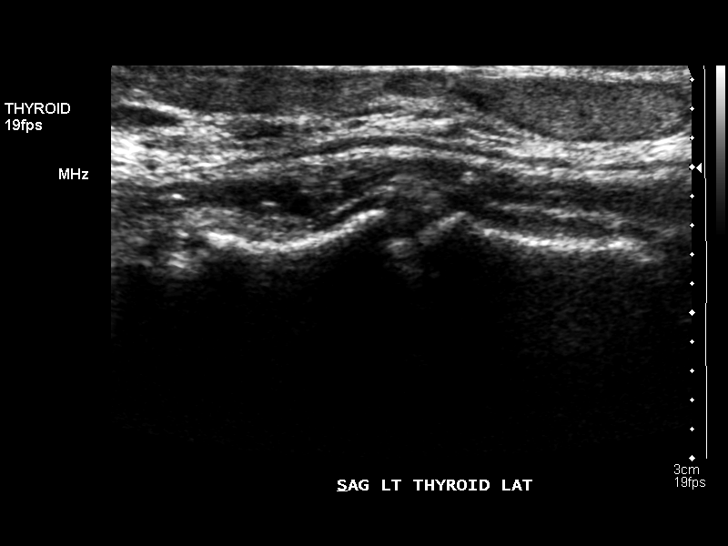
[im 75/82]
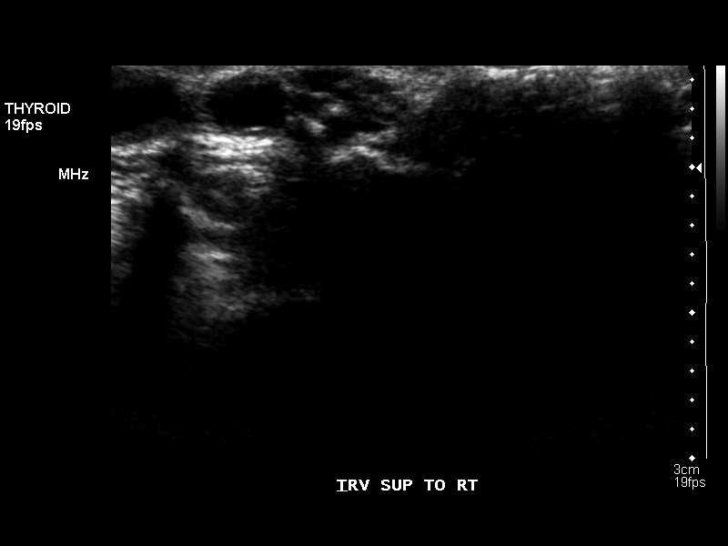
[im 82/82]
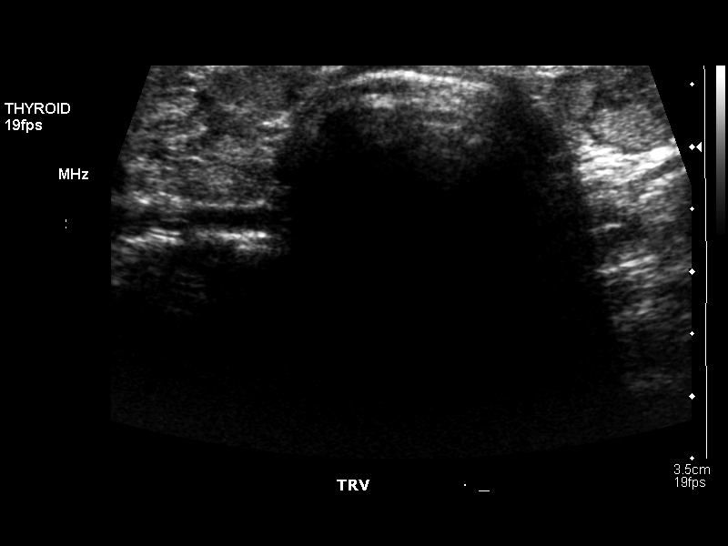

[13 of 25 positions shown; findings below may reference images not displayed]

FINDINGS: Right thyroid lobe:  6.2 x 1.3 x 1.6 cm.  (Previously 5.1 x 1.5 x
1.6 cm).
Left thyroid lobe:  It 2.8 x 0.7 x 1.3 cm.  (Previously 3.6 x 0.8 x
1.0 cm).
Isthmus:  8 mm in thickness.

Focal nodules:   The echogenicity of the thyroid parenchyma is
inhomogeneous.  Multiple nodules are present diffusely.  The
largest nodule emanates from the lower pole of the right lobe with
measurements of 1.7 x 0.6 x 0.6 cm.  This may also represent caudal
extension of lobular thyroid tissue rather than a true nodule,
being somewhat indistinct, and not definitely seen previously.  A
nodule in the right isthmus measures 1.4 x 0.5 x 0.8 cm, with a
nodule at that site previously measuring 1.7 x 0.5 x 1.0 cm.  A
nodule in the medial isthmus to the left of midline measures 1.4 x
0.4 x 0.9 cm measuring 1.0 x 0.3 x 0.6 cm previously.  The
remainder of nodules primarily throughout the left lobe measure no
more than 1.3 cm in diameter.  No definite enlarging nodule is
seen.  The entire gland is rather lobular and inhomogeneous.

Lymphadenopathy:  None visualized.
IMPRESSION: Lobular inhomogeneous thyroid with multiple nodules.  A nodule
described emanating from the lower pole of the right lobe may
represent extension of inhomogeneous thyroid tissue rather than a
true nodule as noted above.  Continued follow-up is recommended.

## 2014-01-14 ENCOUNTER — Other Ambulatory Visit: Payer: Self-pay

## 2014-01-14 DIAGNOSIS — L538 Other specified erythematous conditions: Secondary | ICD-10-CM | POA: Diagnosis not present

## 2014-01-14 DIAGNOSIS — L570 Actinic keratosis: Secondary | ICD-10-CM | POA: Diagnosis not present

## 2014-01-28 DIAGNOSIS — Z961 Presence of intraocular lens: Secondary | ICD-10-CM | POA: Diagnosis not present

## 2014-01-28 DIAGNOSIS — H35379 Puckering of macula, unspecified eye: Secondary | ICD-10-CM | POA: Diagnosis not present

## 2014-01-28 DIAGNOSIS — H02059 Trichiasis without entropian unspecified eye, unspecified eyelid: Secondary | ICD-10-CM | POA: Insufficient documentation

## 2014-01-28 DIAGNOSIS — H4010X0 Unspecified open-angle glaucoma, stage unspecified: Secondary | ICD-10-CM | POA: Diagnosis not present

## 2014-01-28 DIAGNOSIS — H40009 Preglaucoma, unspecified, unspecified eye: Secondary | ICD-10-CM | POA: Diagnosis not present

## 2014-01-28 DIAGNOSIS — H409 Unspecified glaucoma: Secondary | ICD-10-CM | POA: Diagnosis not present

## 2014-05-05 ENCOUNTER — Encounter: Payer: Self-pay | Admitting: Pulmonary Disease

## 2014-05-05 ENCOUNTER — Ambulatory Visit (INDEPENDENT_AMBULATORY_CARE_PROVIDER_SITE_OTHER): Payer: Medicare Other | Admitting: Pulmonary Disease

## 2014-05-05 VITALS — BP 146/82 | HR 74 | Temp 97.2°F | Ht 65.0 in | Wt 125.0 lb

## 2014-05-05 DIAGNOSIS — J449 Chronic obstructive pulmonary disease, unspecified: Secondary | ICD-10-CM | POA: Diagnosis not present

## 2014-05-05 NOTE — Progress Notes (Signed)
   Subjective:    Patient ID: [name], female    DOB: [birth year], 73 y.o.   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 She has not been using her maintenance inhaler, and feels that her breathing is much improved since she has left that work environment. She has not had any acute exacerbations, and almost never uses her rescue inhaler. She feels her exertional tolerance is at baseline.   Review of Systems  Constitutional: Negative for fever and unexpected weight change.  HENT: Positive for postnasal drip. Negative for congestion, dental problem, ear pain, nosebleeds, rhinorrhea, sinus pressure, sneezing, sore throat and trouble swallowing.   Eyes: Negative for redness and itching.  Respiratory: Negative for cough, chest tightness, shortness of breath and wheezing.   Cardiovascular: Negative for palpitations and leg swelling.  Gastrointestinal: Negative for nausea and vomiting.  Genitourinary: Negative for dysuria.  Musculoskeletal: Negative for joint swelling.  Skin: Negative for rash.  Neurological: Negative for headaches.  Hematological: Does not bruise/bleed easily.  Psychiatric/Behavioral: Negative for dysphoric mood. The patient is not nervous/anxious.        Objective:   Physical Exam Thin female in no acute distress Nose without purulence or discharge noted Neck without lymphadenopathy or thyromegaly Chest totally clear to auscultation, no wheezing Cardiac exam with regular rate and rhythm Lower extremities without edema, no cyanosis Alert and oriented, moves all 4 extremities.       Assessment & Plan:

## 2014-05-05 NOTE — Patient Instructions (Signed)
Continue on albuterol as needed, and we will send in prescription to hold in the event you need refills. If increased breathing issues, or if you have a flareup of your breathing, will need to get you back on a daily inhaler. followup with me again in one year.

## 2014-05-05 NOTE — Assessment & Plan Note (Signed)
The patient is doing well on as needed albuterol only, and feels that her exertional tolerance has actually improved since leaving her work environment. I am satisfied with her being off a maintenance regimen as long as she is not having acute exacerbations nor is she having to use her rescue inhaler frequently. I will see her back in one more year for followup, and if she is continuing to do well, I will turn her back over to her primary physician to follow her underlying COPD.

## 2014-07-10 DIAGNOSIS — Z0000 Encounter for general adult medical examination without abnormal findings: Secondary | ICD-10-CM | POA: Diagnosis not present

## 2014-07-10 DIAGNOSIS — I10 Essential (primary) hypertension: Secondary | ICD-10-CM | POA: Diagnosis not present

## 2014-07-10 DIAGNOSIS — M810 Age-related osteoporosis without current pathological fracture: Secondary | ICD-10-CM | POA: Diagnosis not present

## 2014-07-10 DIAGNOSIS — Z23 Encounter for immunization: Secondary | ICD-10-CM | POA: Diagnosis not present

## 2014-07-10 DIAGNOSIS — E039 Hypothyroidism, unspecified: Secondary | ICD-10-CM | POA: Diagnosis not present

## 2014-07-15 DIAGNOSIS — M25512 Pain in left shoulder: Secondary | ICD-10-CM | POA: Diagnosis not present

## 2014-07-15 DIAGNOSIS — I10 Essential (primary) hypertension: Secondary | ICD-10-CM | POA: Diagnosis not present

## 2014-07-15 DIAGNOSIS — Z1212 Encounter for screening for malignant neoplasm of rectum: Secondary | ICD-10-CM | POA: Diagnosis not present

## 2014-07-15 DIAGNOSIS — E049 Nontoxic goiter, unspecified: Secondary | ICD-10-CM | POA: Diagnosis not present

## 2014-07-15 DIAGNOSIS — E039 Hypothyroidism, unspecified: Secondary | ICD-10-CM | POA: Diagnosis not present

## 2014-07-18 IMAGING — US US SOFT TISSUE HEAD/NECK
1 series · 13 of 13 positions shown · non-contrast
Comparison: 08/20/2012

CLINICAL DATA: Goiter, on Synthroid

THYROID ULTRASOUND
TECHNIQUE: Ultrasound examination of the thyroid gland and adjacent
soft tissues was performed.

[Series 1: us soft tissue head/neck · 0.04mm/px · 13 of 13 slices shown]
[im 1/13]
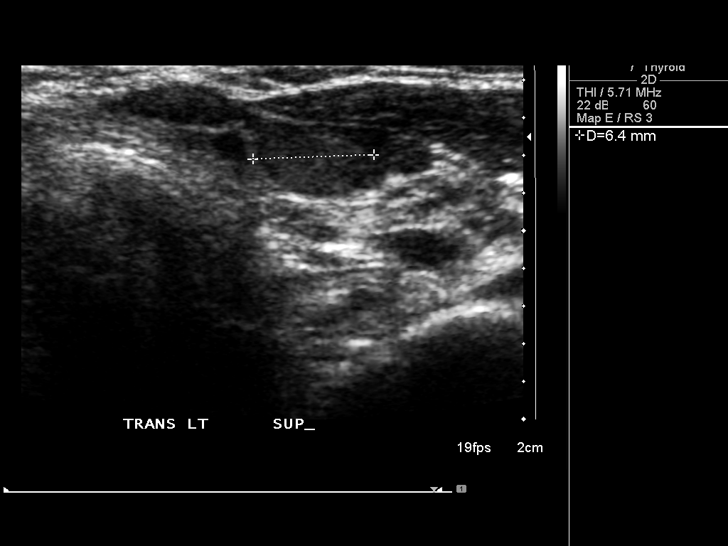
[im 2/13]
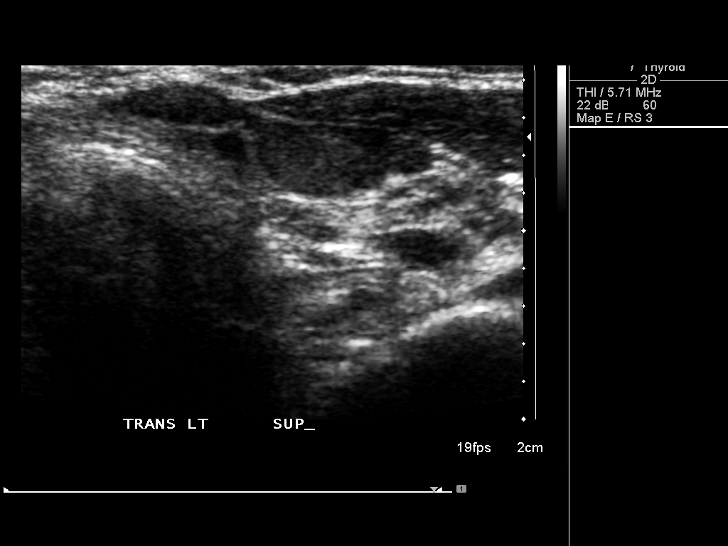
[im 3/13]
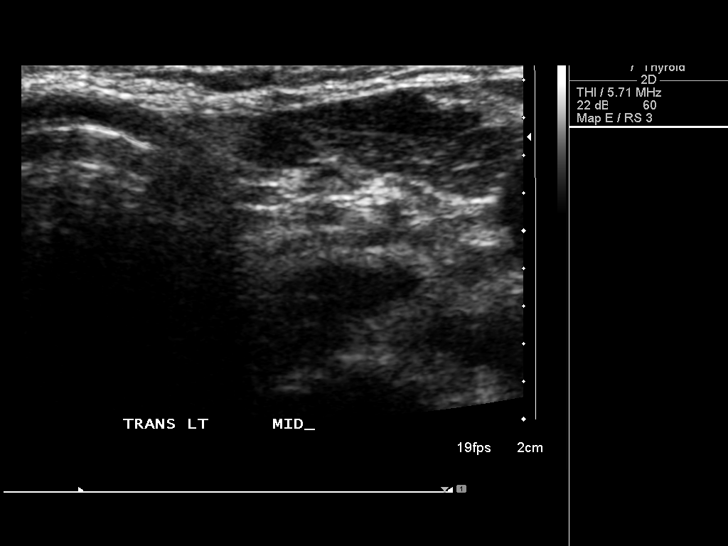
[im 4/13]
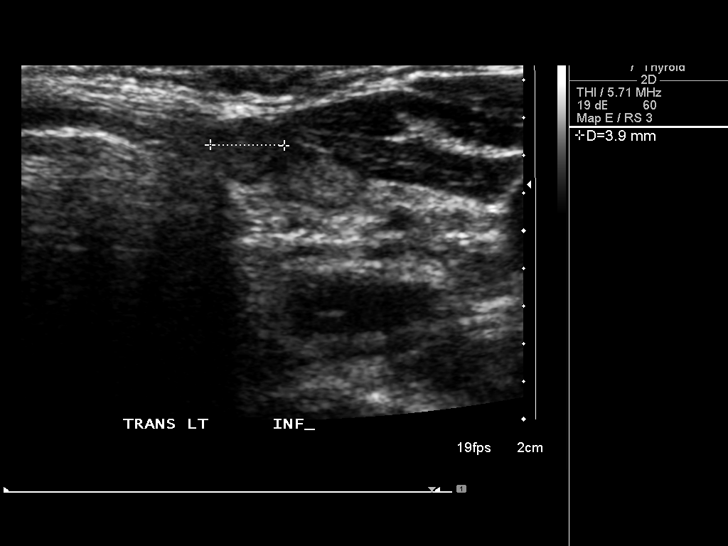
[im 5/13]
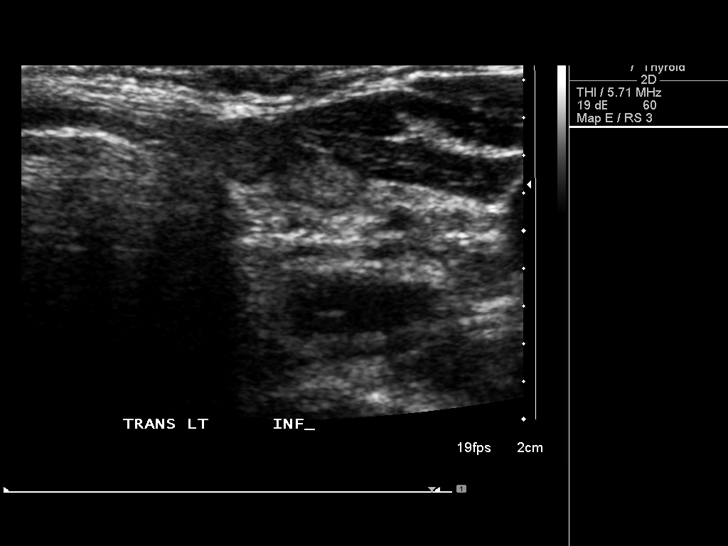
[im 6/13]
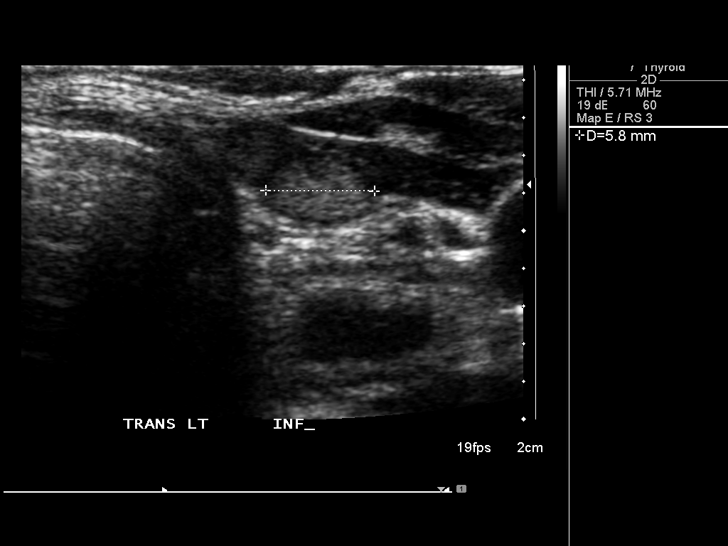
[im 7/13]
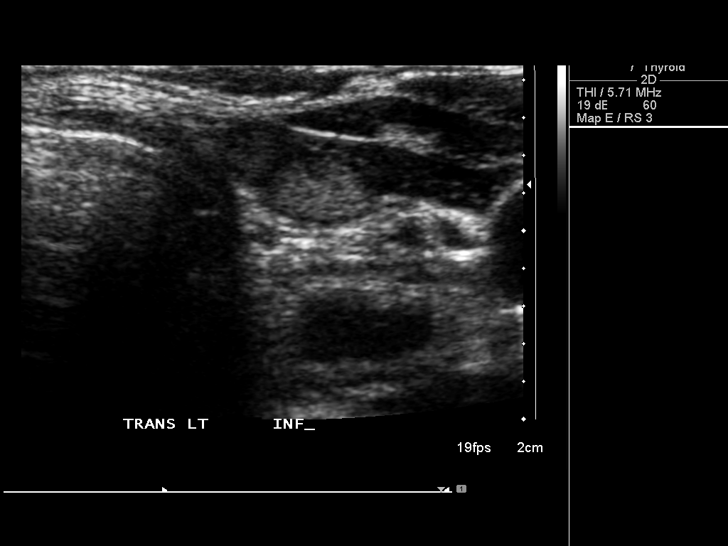
[im 8/13]
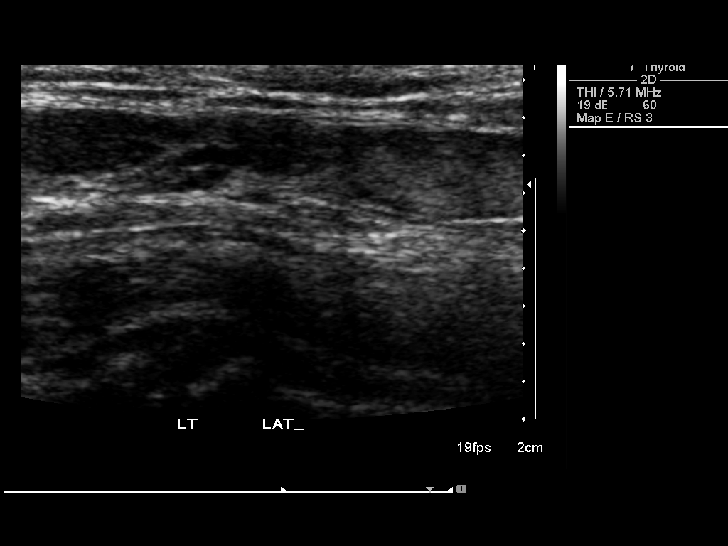
[im 9/13]
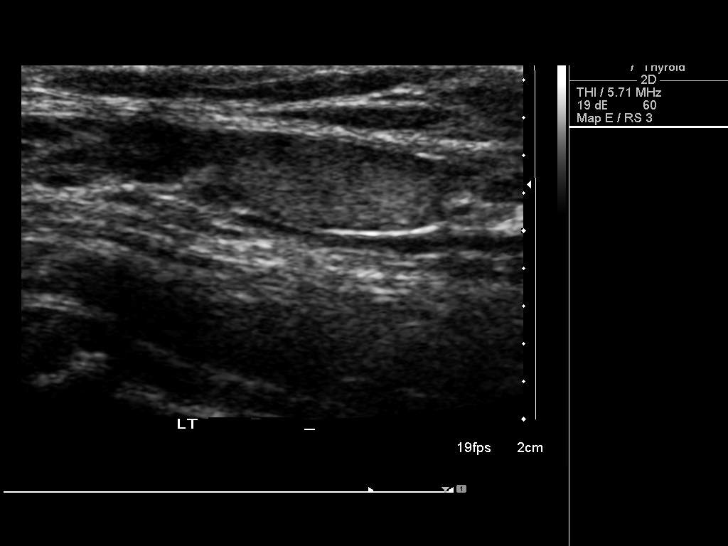
[im 10/13]
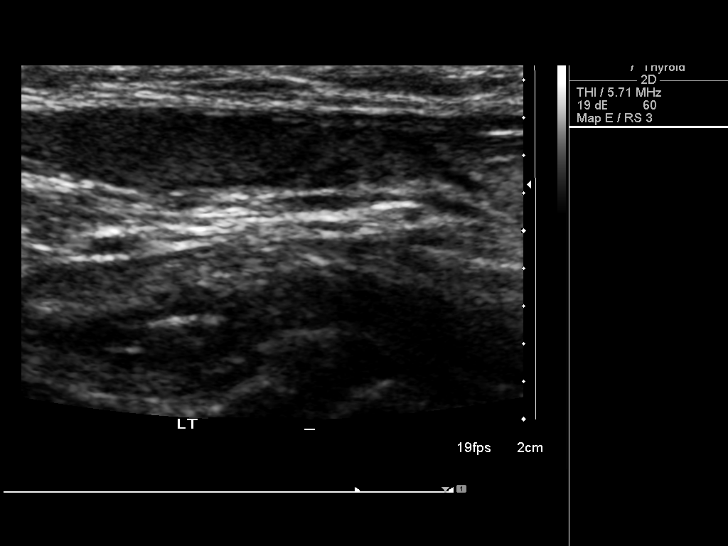
[im 11/13]
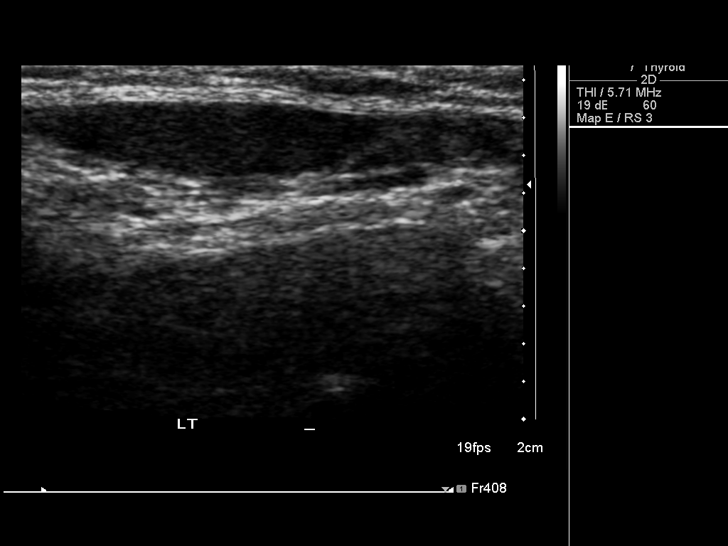
[im 12/13]
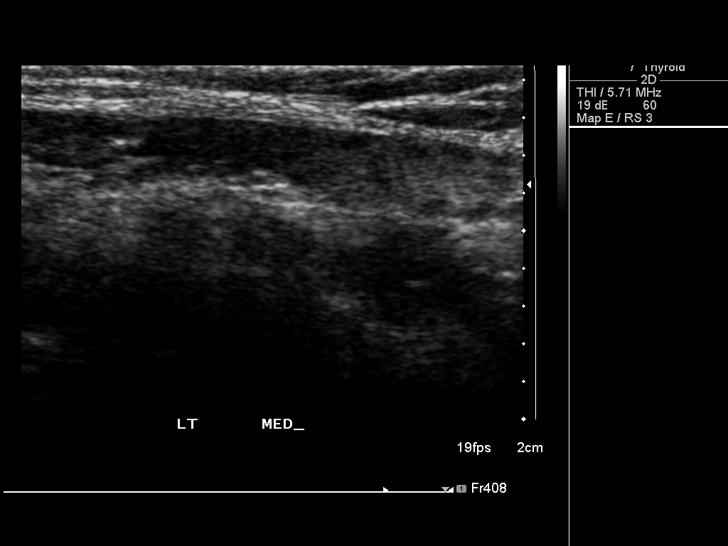
[im 13/13]
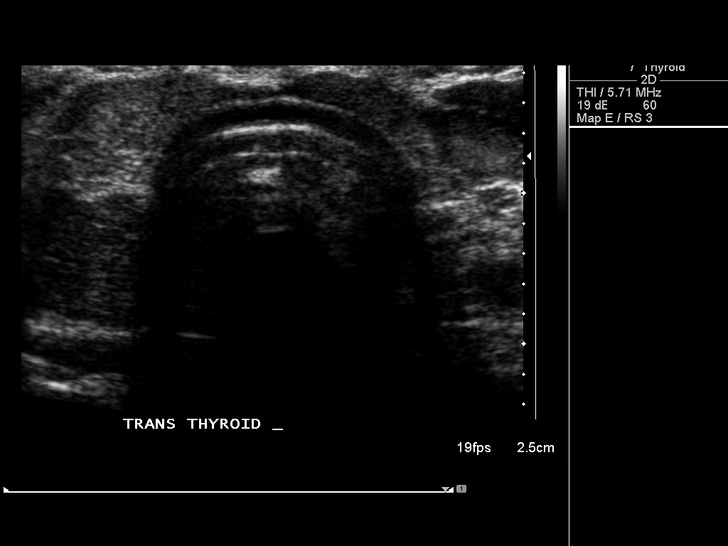

[13 of 13 positions shown; findings below may reference images not displayed]

FINDINGS: Right thyroid lobe:  Measures 6.3 x 1.4 x 1.5 cm, previously 6.2 x
1.3 x 1.5 cm.
Left thyroid lobe:  Measures 4.3 x 0.7 x 0.7 cm, previously 2.8 x
0.7 x 1.3 cm.
Isthmus:  Measures 2 mm in thickness, previously 8 mm.

Focal nodules:  Multiple small bilateral thyroid nodules,
including:
--6 x 8 x 7 mm hyperechoic solid nodule in the right upper gland
--4 mm solid nodule in the right upper gland
--12 x 5 x 12 mm solid nodule in the right isthmus
--8 x 5 x 9 mm solid nodule in the left isthmus
--6 mm solid nodule in the left upper gland
--6 mm hyperechoic solid nodule in the left lower gland

Lymphadenopathy:  None visualized.
IMPRESSION: Multiple bilateral thyroid nodules, as described above, compatible
with multinodular goiter.

## 2014-07-20 DIAGNOSIS — K625 Hemorrhage of anus and rectum: Secondary | ICD-10-CM | POA: Diagnosis not present

## 2014-07-20 DIAGNOSIS — Z1211 Encounter for screening for malignant neoplasm of colon: Secondary | ICD-10-CM | POA: Diagnosis not present

## 2014-07-20 DIAGNOSIS — K219 Gastro-esophageal reflux disease without esophagitis: Secondary | ICD-10-CM | POA: Diagnosis not present

## 2014-08-10 ENCOUNTER — Ambulatory Visit
Admission: RE | Admit: 2014-08-10 | Discharge: 2014-08-10 | Disposition: A | Discharge status: Home / Self Care | Payer: Medicare HMO | Source: Ambulatory Visit | Attending: Endocrinology | Admitting: Endocrinology

## 2014-08-10 DIAGNOSIS — E049 Nontoxic goiter, unspecified: Secondary | ICD-10-CM

## 2015-05-03 DIAGNOSIS — R221 Localized swelling, mass and lump, neck: Secondary | ICD-10-CM | POA: Diagnosis not present

## 2015-05-05 ENCOUNTER — Ambulatory Visit: Payer: Medicare Other | Admitting: Pulmonary Disease

## 2015-05-12 ENCOUNTER — Other Ambulatory Visit: Payer: Self-pay | Admitting: Otolaryngology

## 2015-05-12 DIAGNOSIS — R221 Localized swelling, mass and lump, neck: Secondary | ICD-10-CM

## 2015-05-21 ENCOUNTER — Ambulatory Visit
Admission: RE | Admit: 2015-05-21 | Discharge: 2015-05-21 | Disposition: A | Discharge status: Home / Self Care | Payer: Medicare HMO | Source: Ambulatory Visit | Attending: Otolaryngology | Admitting: Otolaryngology

## 2015-05-21 DIAGNOSIS — Z85810 Personal history of malignant neoplasm of tongue: Secondary | ICD-10-CM | POA: Diagnosis not present

## 2015-05-21 DIAGNOSIS — R221 Localized swelling, mass and lump, neck: Secondary | ICD-10-CM

## 2015-05-21 MED ORDER — IOPAMIDOL (ISOVUE-300) INJECTION 61%
75.0000 mL | Freq: Once | INTRAVENOUS | Status: AC | PRN
  Administered 2015-05-21: 75 mL via INTRAVENOUS

## 2015-05-24 DIAGNOSIS — Z23 Encounter for immunization: Secondary | ICD-10-CM | POA: Diagnosis not present

## 2015-05-28 DIAGNOSIS — H401131 Primary open-angle glaucoma, bilateral, mild stage: Secondary | ICD-10-CM | POA: Diagnosis not present

## 2015-05-28 DIAGNOSIS — Z961 Presence of intraocular lens: Secondary | ICD-10-CM | POA: Diagnosis not present

## 2015-10-04 DIAGNOSIS — H401131 Primary open-angle glaucoma, bilateral, mild stage: Secondary | ICD-10-CM | POA: Diagnosis not present

## 2015-10-04 DIAGNOSIS — H26492 Other secondary cataract, left eye: Secondary | ICD-10-CM | POA: Diagnosis not present

## 2015-10-04 DIAGNOSIS — Z961 Presence of intraocular lens: Secondary | ICD-10-CM | POA: Diagnosis not present

## 2015-10-07 DIAGNOSIS — Z0000 Encounter for general adult medical examination without abnormal findings: Secondary | ICD-10-CM | POA: Diagnosis not present

## 2015-10-07 DIAGNOSIS — E041 Nontoxic single thyroid nodule: Secondary | ICD-10-CM | POA: Diagnosis not present

## 2015-10-07 DIAGNOSIS — M810 Age-related osteoporosis without current pathological fracture: Secondary | ICD-10-CM | POA: Diagnosis not present

## 2015-10-07 DIAGNOSIS — I10 Essential (primary) hypertension: Secondary | ICD-10-CM | POA: Diagnosis not present

## 2015-10-14 DIAGNOSIS — E041 Nontoxic single thyroid nodule: Secondary | ICD-10-CM | POA: Diagnosis not present

## 2015-10-14 DIAGNOSIS — Z1212 Encounter for screening for malignant neoplasm of rectum: Secondary | ICD-10-CM | POA: Diagnosis not present

## 2015-10-14 DIAGNOSIS — I10 Essential (primary) hypertension: Secondary | ICD-10-CM | POA: Diagnosis not present

## 2015-10-14 DIAGNOSIS — M810 Age-related osteoporosis without current pathological fracture: Secondary | ICD-10-CM | POA: Diagnosis not present

## 2015-10-14 DIAGNOSIS — Z0000 Encounter for general adult medical examination without abnormal findings: Secondary | ICD-10-CM | POA: Diagnosis not present

## 2015-12-06 DIAGNOSIS — Z1231 Encounter for screening mammogram for malignant neoplasm of breast: Secondary | ICD-10-CM | POA: Diagnosis not present

## 2015-12-18 IMAGING — US US SOFT TISSUE HEAD/NECK
1 series · 13 of 25 positions shown · non-contrast
Comparison: Prior thyroid ultrasounds 03/10/2013; 08/20/2012 ;
prior thyroid nodule biopsy 12/23/2009

CLINICAL DATA: 73-year-old female with multinodular thyroid gland.
She underwent ultrasound-guided biopsy of the dominant lesions
bilaterally in Saturday November, 2009.

EXAM:
THYROID ULTRASOUND
TECHNIQUE: Ultrasound examination of the thyroid gland and adjacent soft
tissues was performed.

[Series 1: us soft tissue head/neck · 0.05mm/px · 13 of 62 slices shown]
[im 1/62]
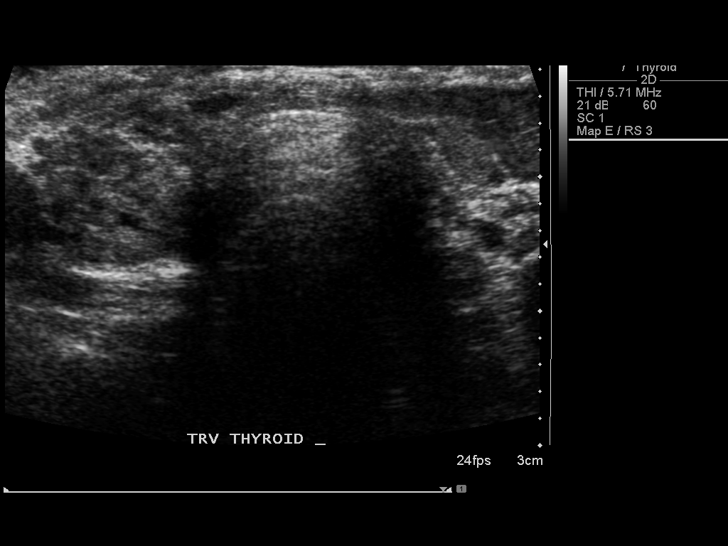
[im 6/62]
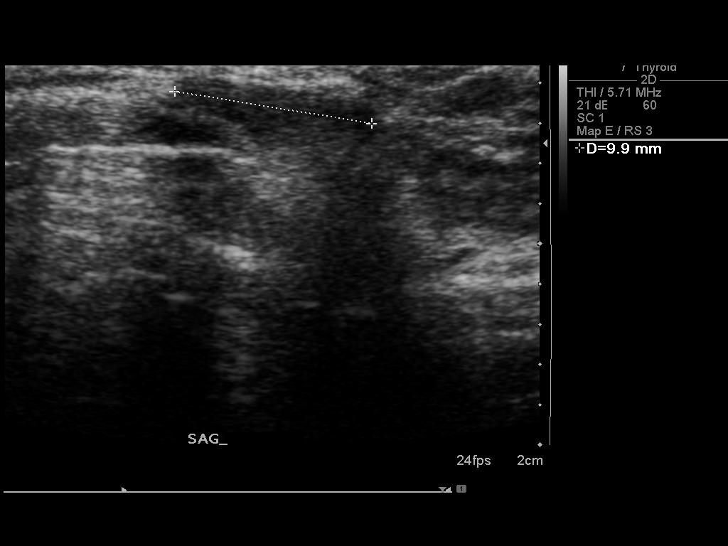
[im 11/62]
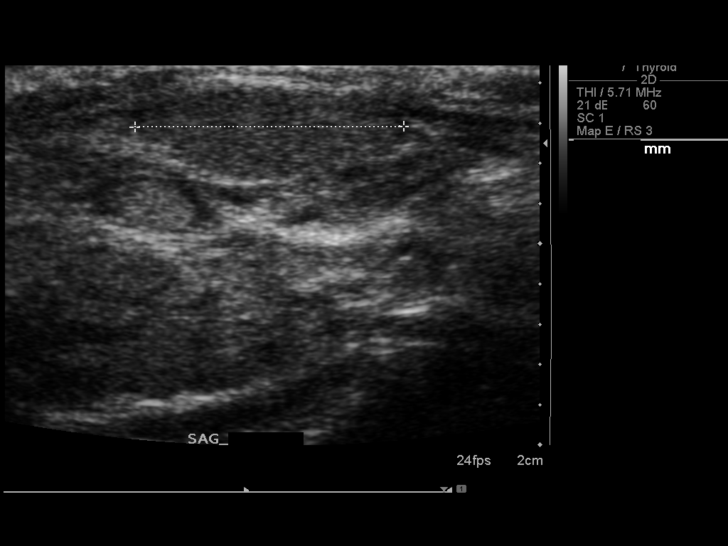
[im 16/62]
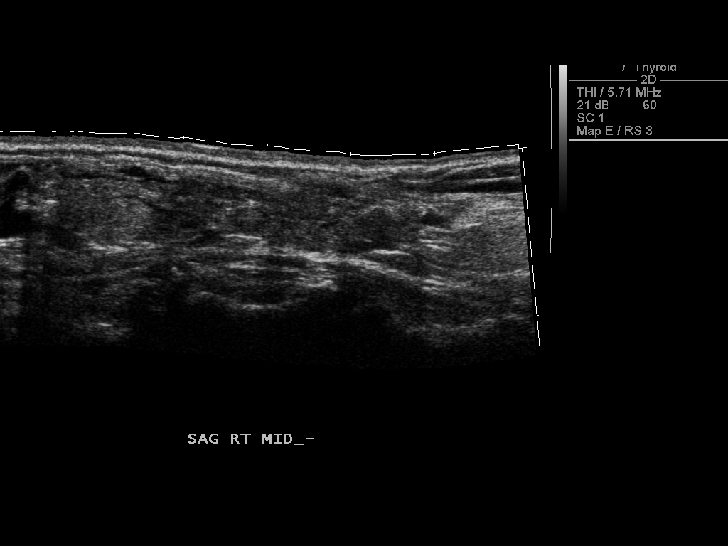
[im 21/62]
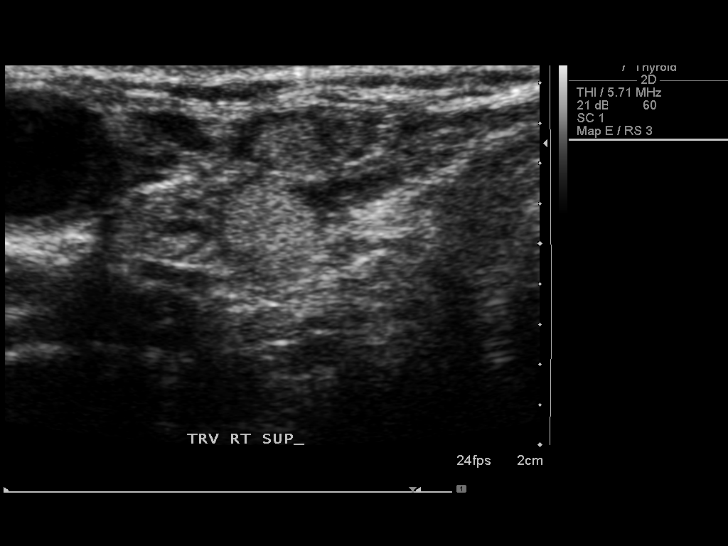
[im 26/62]
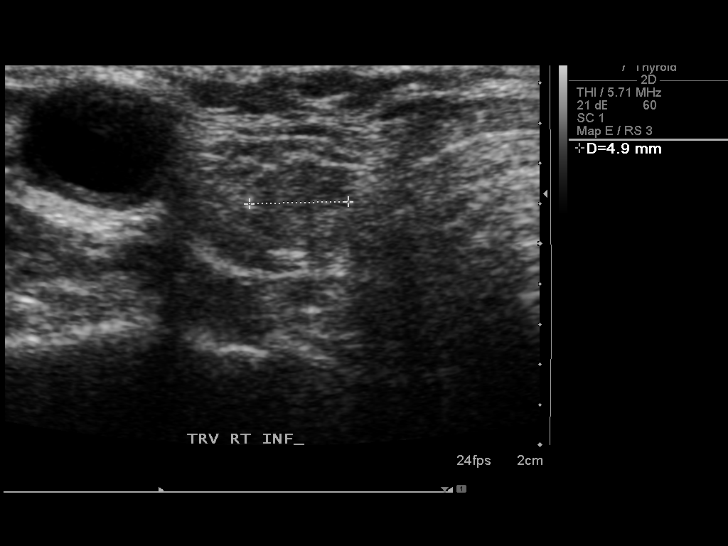
[im 31/62]
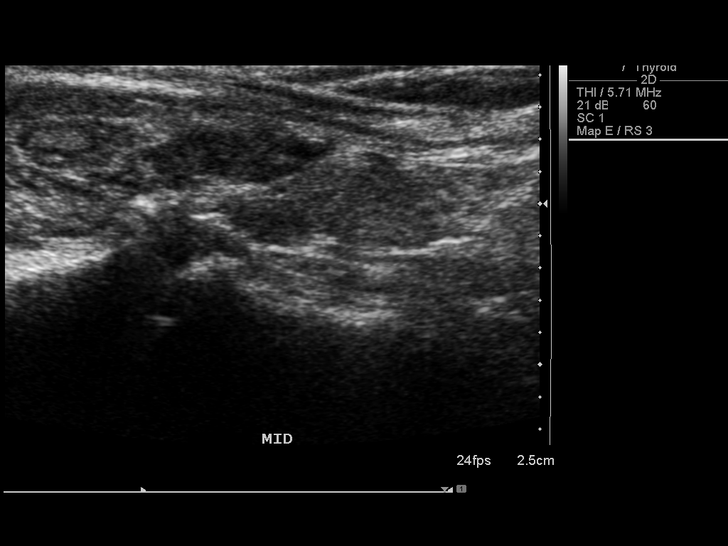
[im 36/62]
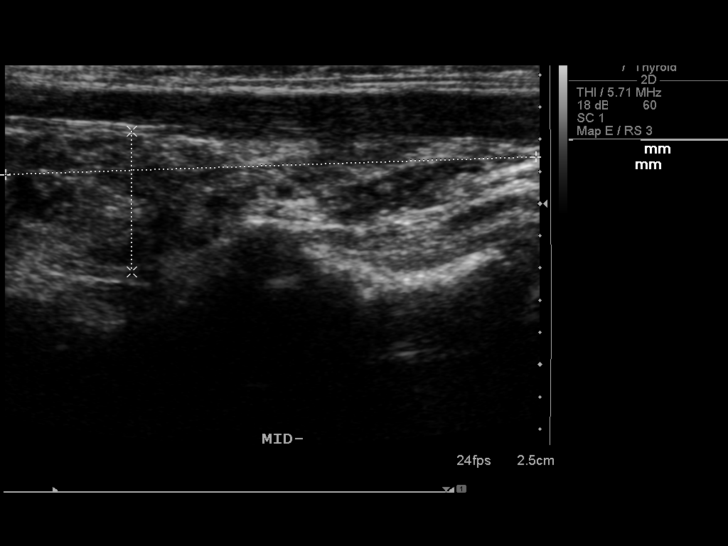
[im 41/62]
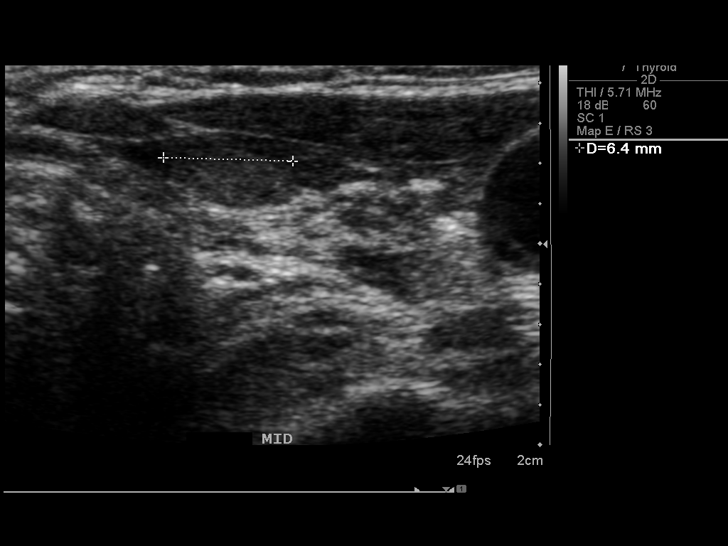
[im 46/62]
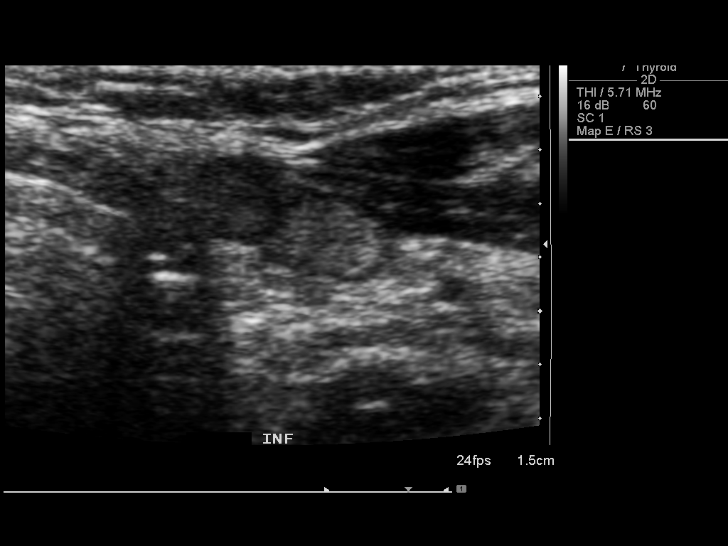
[im 51/62]
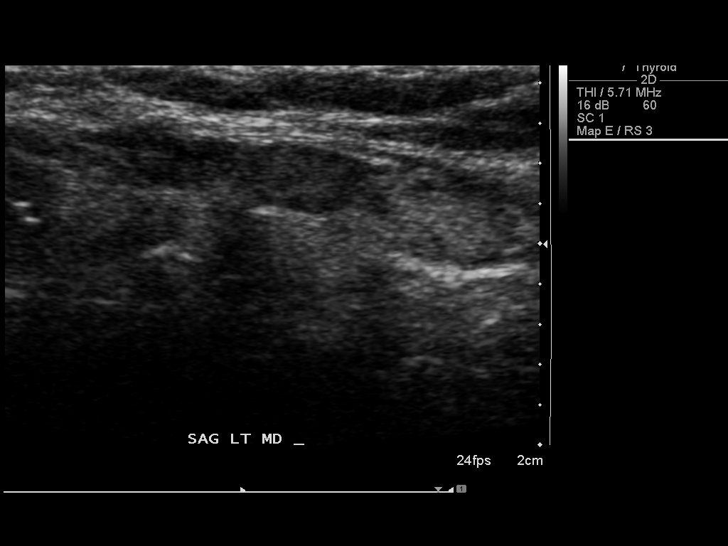
[im 56/62]
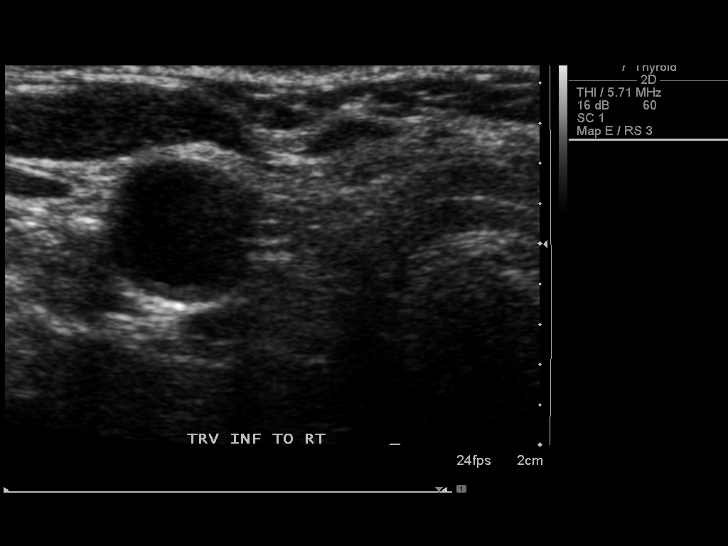
[im 62/62]
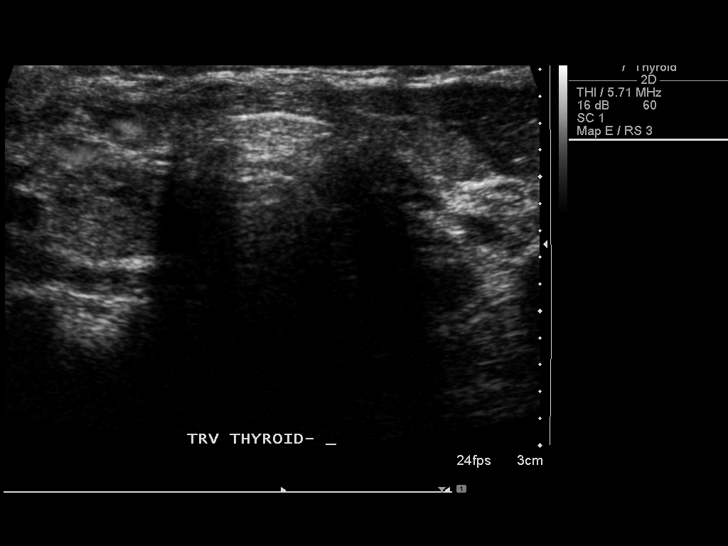

[13 of 25 positions shown; findings below may reference images not displayed]

FINDINGS: Right thyroid lobe

Measurements: 5.9 x 0.9 x 1.5 cm. Diffusely heterogeneous thyroid
parenchyma. Numerous heterogeneous solid nodules ranging from
hypoechoic to echogenic throughout the right thyroid gland. Numerous
nodules measure between 3 and 5 mm. The largest solid nodule at the
lower pole measures 5 x 13 x 5 mm. This nodule was not discretely
imaged previously and may in fact represent a pseudo nodule created
by a lobulation of the underlying thyroid parenchyma.

Left thyroid lobe

Measurements: 3.3 x 0.9 x 1.3 cm. Diffusely heterogeneous thyroid
parenchyma. Numerous nodules ranging from hypoechoic to echogenic
but measuring less than 1 cm throughout the gland. The largest
nodule is ovoid and noted in the lower pole at 5 x 13 x 4 mm. It has
not significantly changed compared to prior imaging.

Isthmus

Thickness: 0.1 cm. Diffusely heterogeneous. Stable 13 mm hypoechoic
ovoid nodule in the right aspect of the isthmus.

Lymphadenopathy

None visualized.
IMPRESSION: Numerous heterogeneous solid thyroid nodules bilaterally with the
largest nodules measuring 13 mm in the isthmus, and both the right
and left lower poles.

Overall, there has been no significant interval change compared to
03/10/2013. Findings do not meet current SRU consensus criteria for
biopsy. Follow-up by clinical exam is recommended. If patient has
known risk factors for thyroid carcinoma, consider follow-up
ultrasound in 12 months. If patient is clinically hyperthyroid,
consider nuclear medicine thyroid uptake and scan.Reference:
Management of Thyroid Nodules Detected at US: Society of
Radiologists in Ultrasound Consensus Conference Statement. Radiology

## 2015-12-28 DIAGNOSIS — Z0000 Encounter for general adult medical examination without abnormal findings: Secondary | ICD-10-CM | POA: Diagnosis not present

## 2016-01-27 DIAGNOSIS — M810 Age-related osteoporosis without current pathological fracture: Secondary | ICD-10-CM | POA: Diagnosis not present

## 2016-02-15 DIAGNOSIS — M810 Age-related osteoporosis without current pathological fracture: Secondary | ICD-10-CM | POA: Diagnosis not present

## 2016-03-06 DIAGNOSIS — H26492 Other secondary cataract, left eye: Secondary | ICD-10-CM | POA: Diagnosis not present

## 2016-03-06 DIAGNOSIS — H401131 Primary open-angle glaucoma, bilateral, mild stage: Secondary | ICD-10-CM | POA: Diagnosis not present

## 2016-03-06 DIAGNOSIS — Z961 Presence of intraocular lens: Secondary | ICD-10-CM | POA: Diagnosis not present

## 2016-05-22 DIAGNOSIS — Z23 Encounter for immunization: Secondary | ICD-10-CM | POA: Diagnosis not present

## 2016-08-21 DIAGNOSIS — H26492 Other secondary cataract, left eye: Secondary | ICD-10-CM | POA: Diagnosis not present

## 2016-08-21 DIAGNOSIS — Z961 Presence of intraocular lens: Secondary | ICD-10-CM | POA: Diagnosis not present

## 2016-08-21 DIAGNOSIS — H401131 Primary open-angle glaucoma, bilateral, mild stage: Secondary | ICD-10-CM | POA: Diagnosis not present

## 2016-09-07 DIAGNOSIS — H401131 Primary open-angle glaucoma, bilateral, mild stage: Secondary | ICD-10-CM | POA: Diagnosis not present

## 2016-10-23 DIAGNOSIS — Z961 Presence of intraocular lens: Secondary | ICD-10-CM | POA: Diagnosis not present

## 2016-10-23 DIAGNOSIS — H401131 Primary open-angle glaucoma, bilateral, mild stage: Secondary | ICD-10-CM | POA: Diagnosis not present

## 2016-10-23 DIAGNOSIS — H26492 Other secondary cataract, left eye: Secondary | ICD-10-CM | POA: Diagnosis not present

## 2016-10-26 DIAGNOSIS — N390 Urinary tract infection, site not specified: Secondary | ICD-10-CM | POA: Diagnosis not present

## 2016-10-26 DIAGNOSIS — Z0000 Encounter for general adult medical examination without abnormal findings: Secondary | ICD-10-CM | POA: Diagnosis not present

## 2016-10-26 DIAGNOSIS — E559 Vitamin D deficiency, unspecified: Secondary | ICD-10-CM | POA: Diagnosis not present

## 2016-10-26 DIAGNOSIS — M810 Age-related osteoporosis without current pathological fracture: Secondary | ICD-10-CM | POA: Diagnosis not present

## 2016-10-26 DIAGNOSIS — E039 Hypothyroidism, unspecified: Secondary | ICD-10-CM | POA: Diagnosis not present

## 2016-10-26 DIAGNOSIS — I10 Essential (primary) hypertension: Secondary | ICD-10-CM | POA: Diagnosis not present

## 2016-11-02 ENCOUNTER — Other Ambulatory Visit: Payer: Self-pay | Admitting: Internal Medicine

## 2016-11-02 DIAGNOSIS — Z0001 Encounter for general adult medical examination with abnormal findings: Secondary | ICD-10-CM | POA: Diagnosis not present

## 2016-11-02 DIAGNOSIS — E041 Nontoxic single thyroid nodule: Secondary | ICD-10-CM

## 2016-11-02 DIAGNOSIS — Z1212 Encounter for screening for malignant neoplasm of rectum: Secondary | ICD-10-CM | POA: Diagnosis not present

## 2016-11-02 DIAGNOSIS — L920 Granuloma annulare: Secondary | ICD-10-CM | POA: Diagnosis not present

## 2016-11-02 DIAGNOSIS — L400 Psoriasis vulgaris: Secondary | ICD-10-CM | POA: Diagnosis not present

## 2016-11-02 DIAGNOSIS — I7090 Unspecified atherosclerosis: Secondary | ICD-10-CM

## 2016-11-02 DIAGNOSIS — E039 Hypothyroidism, unspecified: Secondary | ICD-10-CM | POA: Diagnosis not present

## 2016-11-10 ENCOUNTER — Ambulatory Visit
Admission: RE | Admit: 2016-11-10 | Discharge: 2016-11-10 | Disposition: A | Discharge status: Home / Self Care | Payer: Medicare HMO | Source: Ambulatory Visit | Attending: Internal Medicine | Admitting: Internal Medicine

## 2016-11-10 DIAGNOSIS — I7090 Unspecified atherosclerosis: Secondary | ICD-10-CM

## 2016-11-10 DIAGNOSIS — E041 Nontoxic single thyroid nodule: Secondary | ICD-10-CM

## 2016-11-10 DIAGNOSIS — I6523 Occlusion and stenosis of bilateral carotid arteries: Secondary | ICD-10-CM | POA: Diagnosis not present

## 2016-11-10 DIAGNOSIS — E042 Nontoxic multinodular goiter: Secondary | ICD-10-CM | POA: Diagnosis not present

## 2016-12-05 DIAGNOSIS — Z85810 Personal history of malignant neoplasm of tongue: Secondary | ICD-10-CM | POA: Diagnosis not present

## 2016-12-06 DIAGNOSIS — Z1231 Encounter for screening mammogram for malignant neoplasm of breast: Secondary | ICD-10-CM | POA: Diagnosis not present

## 2017-03-15 DIAGNOSIS — K111 Hypertrophy of salivary gland: Secondary | ICD-10-CM | POA: Diagnosis not present

## 2017-03-15 DIAGNOSIS — I10 Essential (primary) hypertension: Secondary | ICD-10-CM | POA: Diagnosis not present

## 2017-03-21 DIAGNOSIS — K1120 Sialoadenitis, unspecified: Secondary | ICD-10-CM | POA: Diagnosis not present

## 2017-03-22 DIAGNOSIS — I10 Essential (primary) hypertension: Secondary | ICD-10-CM | POA: Diagnosis not present

## 2017-04-05 DIAGNOSIS — K1120 Sialoadenitis, unspecified: Secondary | ICD-10-CM | POA: Diagnosis not present

## 2017-04-09 DIAGNOSIS — H401114 Primary open-angle glaucoma, right eye, indeterminate stage: Secondary | ICD-10-CM | POA: Diagnosis not present

## 2017-04-09 DIAGNOSIS — H401124 Primary open-angle glaucoma, left eye, indeterminate stage: Secondary | ICD-10-CM | POA: Diagnosis not present

## 2017-04-19 DIAGNOSIS — Z23 Encounter for immunization: Secondary | ICD-10-CM | POA: Diagnosis not present

## 2017-04-19 DIAGNOSIS — I6529 Occlusion and stenosis of unspecified carotid artery: Secondary | ICD-10-CM | POA: Diagnosis not present

## 2017-04-19 DIAGNOSIS — I10 Essential (primary) hypertension: Secondary | ICD-10-CM | POA: Diagnosis not present

## 2017-05-04 DIAGNOSIS — H401121 Primary open-angle glaucoma, left eye, mild stage: Secondary | ICD-10-CM | POA: Diagnosis not present

## 2017-05-04 DIAGNOSIS — H401111 Primary open-angle glaucoma, right eye, mild stage: Secondary | ICD-10-CM | POA: Diagnosis not present

## 2017-05-17 DIAGNOSIS — Z0389 Encounter for observation for other suspected diseases and conditions ruled out: Secondary | ICD-10-CM | POA: Diagnosis not present

## 2017-05-17 DIAGNOSIS — I6529 Occlusion and stenosis of unspecified carotid artery: Secondary | ICD-10-CM | POA: Diagnosis not present

## 2017-06-19 DIAGNOSIS — H401114 Primary open-angle glaucoma, right eye, indeterminate stage: Secondary | ICD-10-CM | POA: Diagnosis not present

## 2017-06-19 DIAGNOSIS — H401124 Primary open-angle glaucoma, left eye, indeterminate stage: Secondary | ICD-10-CM | POA: Diagnosis not present

## 2017-07-16 DIAGNOSIS — H401111 Primary open-angle glaucoma, right eye, mild stage: Secondary | ICD-10-CM | POA: Diagnosis not present

## 2017-07-16 DIAGNOSIS — H401121 Primary open-angle glaucoma, left eye, mild stage: Secondary | ICD-10-CM | POA: Diagnosis not present

## 2017-07-16 DIAGNOSIS — H35351 Cystoid macular degeneration, right eye: Secondary | ICD-10-CM | POA: Diagnosis not present

## 2017-08-09 DIAGNOSIS — H35351 Cystoid macular degeneration, right eye: Secondary | ICD-10-CM | POA: Diagnosis not present

## 2017-08-09 DIAGNOSIS — Z961 Presence of intraocular lens: Secondary | ICD-10-CM | POA: Diagnosis not present

## 2017-08-09 DIAGNOSIS — H401111 Primary open-angle glaucoma, right eye, mild stage: Secondary | ICD-10-CM | POA: Diagnosis not present

## 2017-08-09 DIAGNOSIS — H401121 Primary open-angle glaucoma, left eye, mild stage: Secondary | ICD-10-CM | POA: Diagnosis not present

## 2017-09-17 DIAGNOSIS — H401121 Primary open-angle glaucoma, left eye, mild stage: Secondary | ICD-10-CM | POA: Diagnosis not present

## 2017-09-17 DIAGNOSIS — H401111 Primary open-angle glaucoma, right eye, mild stage: Secondary | ICD-10-CM | POA: Diagnosis not present

## 2017-09-20 DIAGNOSIS — H35351 Cystoid macular degeneration, right eye: Secondary | ICD-10-CM | POA: Diagnosis not present

## 2017-09-20 DIAGNOSIS — H401111 Primary open-angle glaucoma, right eye, mild stage: Secondary | ICD-10-CM | POA: Diagnosis not present

## 2017-09-20 DIAGNOSIS — H401121 Primary open-angle glaucoma, left eye, mild stage: Secondary | ICD-10-CM | POA: Diagnosis not present

## 2017-09-20 DIAGNOSIS — Z961 Presence of intraocular lens: Secondary | ICD-10-CM | POA: Diagnosis not present

## 2017-10-18 DIAGNOSIS — Z961 Presence of intraocular lens: Secondary | ICD-10-CM | POA: Diagnosis not present

## 2017-10-18 DIAGNOSIS — H401121 Primary open-angle glaucoma, left eye, mild stage: Secondary | ICD-10-CM | POA: Diagnosis not present

## 2017-10-18 DIAGNOSIS — H35351 Cystoid macular degeneration, right eye: Secondary | ICD-10-CM | POA: Diagnosis not present

## 2017-10-18 DIAGNOSIS — H401111 Primary open-angle glaucoma, right eye, mild stage: Secondary | ICD-10-CM | POA: Diagnosis not present

## 2017-11-12 DIAGNOSIS — E559 Vitamin D deficiency, unspecified: Secondary | ICD-10-CM | POA: Diagnosis not present

## 2017-11-12 DIAGNOSIS — M810 Age-related osteoporosis without current pathological fracture: Secondary | ICD-10-CM | POA: Diagnosis not present

## 2017-11-12 DIAGNOSIS — E039 Hypothyroidism, unspecified: Secondary | ICD-10-CM | POA: Diagnosis not present

## 2017-11-12 DIAGNOSIS — I10 Essential (primary) hypertension: Secondary | ICD-10-CM | POA: Diagnosis not present

## 2017-11-15 DIAGNOSIS — E049 Nontoxic goiter, unspecified: Secondary | ICD-10-CM | POA: Diagnosis not present

## 2017-11-15 DIAGNOSIS — M810 Age-related osteoporosis without current pathological fracture: Secondary | ICD-10-CM | POA: Diagnosis not present

## 2017-11-15 DIAGNOSIS — H35351 Cystoid macular degeneration, right eye: Secondary | ICD-10-CM | POA: Diagnosis not present

## 2017-11-15 DIAGNOSIS — I10 Essential (primary) hypertension: Secondary | ICD-10-CM | POA: Diagnosis not present

## 2017-11-15 DIAGNOSIS — H401111 Primary open-angle glaucoma, right eye, mild stage: Secondary | ICD-10-CM | POA: Diagnosis not present

## 2017-11-15 DIAGNOSIS — Z0001 Encounter for general adult medical examination with abnormal findings: Secondary | ICD-10-CM | POA: Diagnosis not present

## 2017-11-15 DIAGNOSIS — M1990 Unspecified osteoarthritis, unspecified site: Secondary | ICD-10-CM | POA: Diagnosis not present

## 2017-11-15 DIAGNOSIS — I6522 Occlusion and stenosis of left carotid artery: Secondary | ICD-10-CM | POA: Diagnosis not present

## 2017-11-15 DIAGNOSIS — L920 Granuloma annulare: Secondary | ICD-10-CM | POA: Diagnosis not present

## 2017-11-15 DIAGNOSIS — H401121 Primary open-angle glaucoma, left eye, mild stage: Secondary | ICD-10-CM | POA: Diagnosis not present

## 2017-11-15 DIAGNOSIS — J449 Chronic obstructive pulmonary disease, unspecified: Secondary | ICD-10-CM | POA: Diagnosis not present

## 2017-11-15 DIAGNOSIS — E039 Hypothyroidism, unspecified: Secondary | ICD-10-CM | POA: Diagnosis not present

## 2017-11-15 DIAGNOSIS — Z961 Presence of intraocular lens: Secondary | ICD-10-CM | POA: Diagnosis not present

## 2017-11-15 DIAGNOSIS — H409 Unspecified glaucoma: Secondary | ICD-10-CM | POA: Diagnosis not present

## 2017-11-21 DIAGNOSIS — H401121 Primary open-angle glaucoma, left eye, mild stage: Secondary | ICD-10-CM | POA: Diagnosis not present

## 2017-11-21 DIAGNOSIS — H401111 Primary open-angle glaucoma, right eye, mild stage: Secondary | ICD-10-CM | POA: Diagnosis not present

## 2017-12-10 DIAGNOSIS — Z1231 Encounter for screening mammogram for malignant neoplasm of breast: Secondary | ICD-10-CM | POA: Diagnosis not present

## 2018-01-15 DIAGNOSIS — E039 Hypothyroidism, unspecified: Secondary | ICD-10-CM | POA: Diagnosis not present

## 2018-01-17 DIAGNOSIS — H401111 Primary open-angle glaucoma, right eye, mild stage: Secondary | ICD-10-CM | POA: Diagnosis not present

## 2018-01-17 DIAGNOSIS — H35351 Cystoid macular degeneration, right eye: Secondary | ICD-10-CM | POA: Diagnosis not present

## 2018-01-17 DIAGNOSIS — H401121 Primary open-angle glaucoma, left eye, mild stage: Secondary | ICD-10-CM | POA: Diagnosis not present

## 2018-01-17 DIAGNOSIS — Z961 Presence of intraocular lens: Secondary | ICD-10-CM | POA: Diagnosis not present

## 2018-02-07 DIAGNOSIS — M810 Age-related osteoporosis without current pathological fracture: Secondary | ICD-10-CM | POA: Diagnosis not present

## 2018-02-11 DIAGNOSIS — H401121 Primary open-angle glaucoma, left eye, mild stage: Secondary | ICD-10-CM | POA: Diagnosis not present

## 2018-02-11 DIAGNOSIS — H401111 Primary open-angle glaucoma, right eye, mild stage: Secondary | ICD-10-CM | POA: Diagnosis not present

## 2018-02-21 DIAGNOSIS — I10 Essential (primary) hypertension: Secondary | ICD-10-CM

## 2018-02-21 DIAGNOSIS — E039 Hypothyroidism, unspecified: Secondary | ICD-10-CM

## 2018-02-21 HISTORY — DX: Essential (primary) hypertension: I10

## 2018-03-05 DIAGNOSIS — Z79899 Other long term (current) drug therapy: Secondary | ICD-10-CM | POA: Diagnosis not present

## 2018-03-05 DIAGNOSIS — J449 Chronic obstructive pulmonary disease, unspecified: Secondary | ICD-10-CM | POA: Diagnosis not present

## 2018-03-05 DIAGNOSIS — Z87891 Personal history of nicotine dependence: Secondary | ICD-10-CM | POA: Diagnosis not present

## 2018-03-05 DIAGNOSIS — E039 Hypothyroidism, unspecified: Secondary | ICD-10-CM | POA: Diagnosis not present

## 2018-03-05 DIAGNOSIS — E042 Nontoxic multinodular goiter: Secondary | ICD-10-CM | POA: Diagnosis not present

## 2018-03-05 DIAGNOSIS — Z961 Presence of intraocular lens: Secondary | ICD-10-CM | POA: Diagnosis not present

## 2018-03-05 DIAGNOSIS — H401112 Primary open-angle glaucoma, right eye, moderate stage: Secondary | ICD-10-CM | POA: Diagnosis not present

## 2018-03-05 DIAGNOSIS — R0989 Other specified symptoms and signs involving the circulatory and respiratory systems: Secondary | ICD-10-CM | POA: Diagnosis not present

## 2018-03-05 DIAGNOSIS — Z85810 Personal history of malignant neoplasm of tongue: Secondary | ICD-10-CM | POA: Diagnosis not present

## 2018-03-05 DIAGNOSIS — I10 Essential (primary) hypertension: Secondary | ICD-10-CM | POA: Diagnosis not present

## 2018-03-06 DIAGNOSIS — H401131 Primary open-angle glaucoma, bilateral, mild stage: Secondary | ICD-10-CM | POA: Diagnosis not present

## 2018-03-06 DIAGNOSIS — Z48810 Encounter for surgical aftercare following surgery on the sense organs: Secondary | ICD-10-CM | POA: Diagnosis not present

## 2018-04-04 DIAGNOSIS — H401113 Primary open-angle glaucoma, right eye, severe stage: Secondary | ICD-10-CM | POA: Diagnosis not present

## 2018-04-04 DIAGNOSIS — H35351 Cystoid macular degeneration, right eye: Secondary | ICD-10-CM | POA: Diagnosis not present

## 2018-04-04 DIAGNOSIS — H401124 Primary open-angle glaucoma, left eye, indeterminate stage: Secondary | ICD-10-CM | POA: Diagnosis not present

## 2018-04-04 DIAGNOSIS — Z961 Presence of intraocular lens: Secondary | ICD-10-CM | POA: Diagnosis not present

## 2018-05-03 DIAGNOSIS — Z23 Encounter for immunization: Secondary | ICD-10-CM | POA: Diagnosis not present

## 2018-05-15 IMAGING — US US THYROID
1 series · 12 of 25 positions shown · non-contrast
Comparison: 08/10/2014

CLINICAL DATA: Prior ultrasound follow-up.

EXAM:
THYROID ULTRASOUND
TECHNIQUE: Ultrasound examination of the thyroid gland and adjacent soft
tissues was performed.

[Series 1: us thyroid · 0.06mm/px · 12 of 46 slices shown]
[im 2/46]
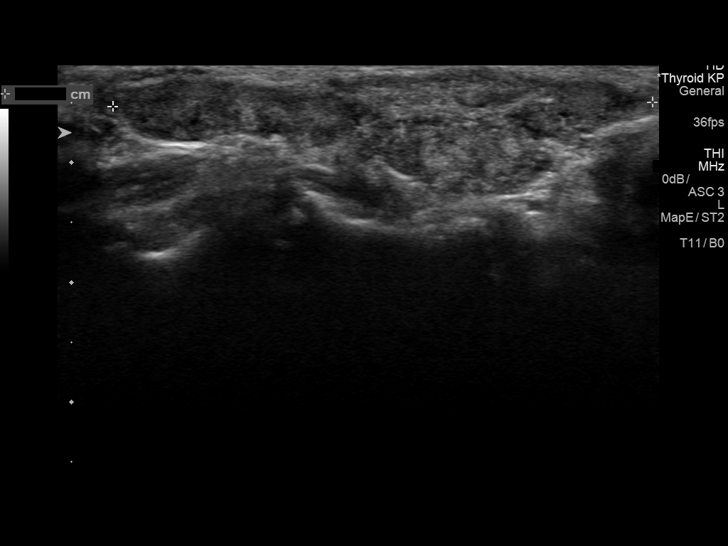
[im 6/46]
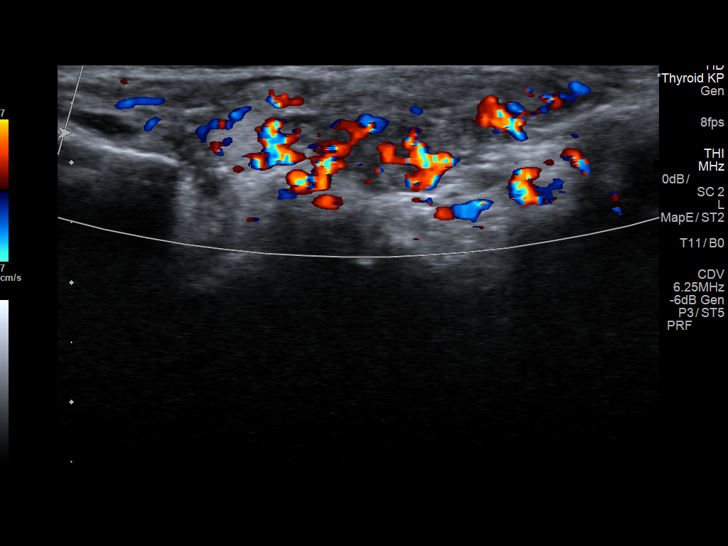
[im 10/46]
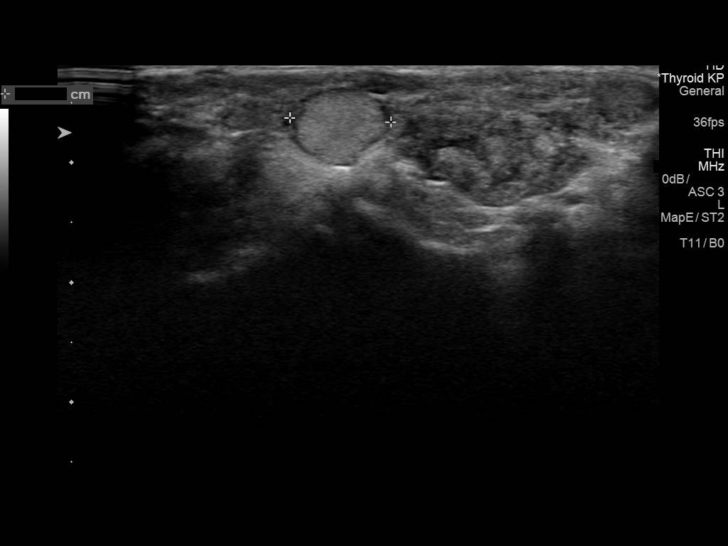
[im 14/46]
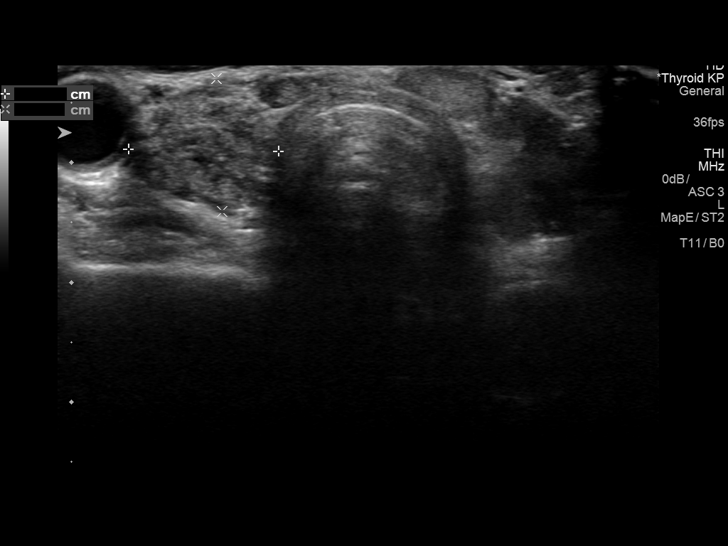
[im 17/46]
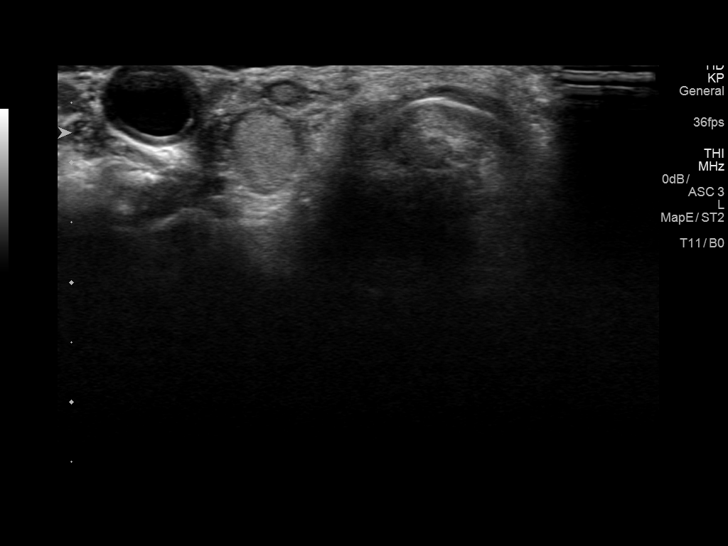
[im 21/46]
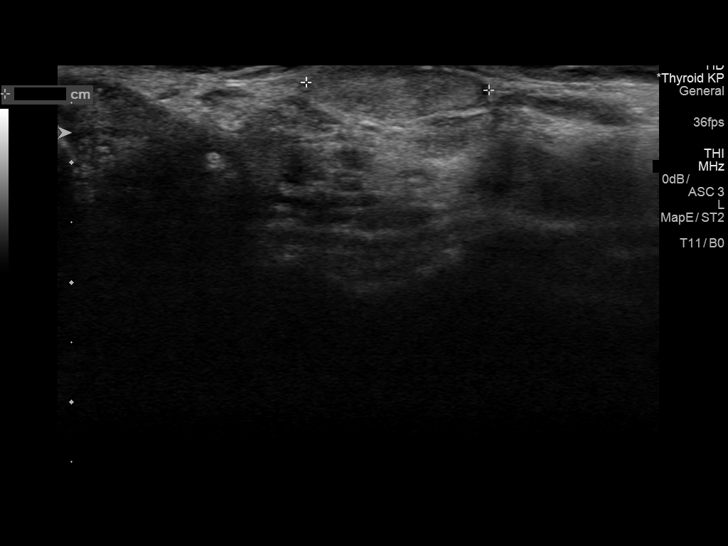
[im 25/46]
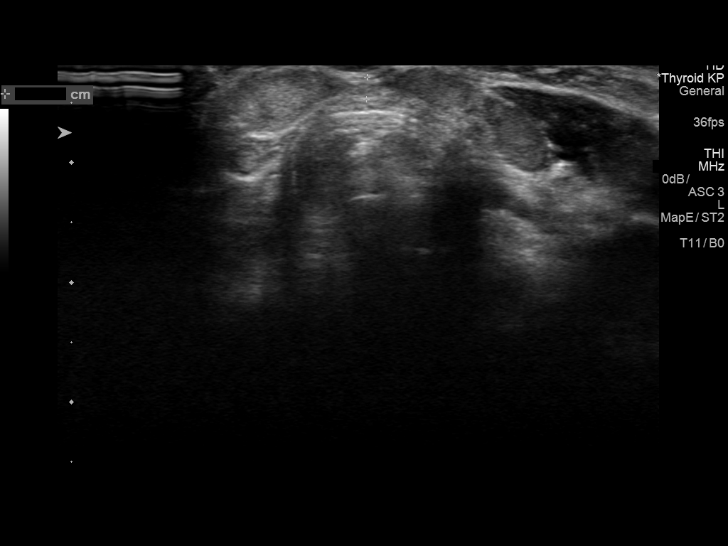
[im 29/46]
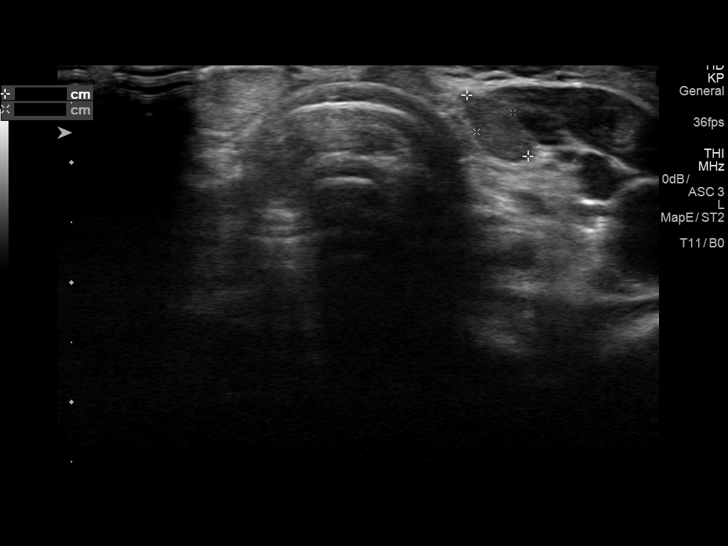
[im 32/46]
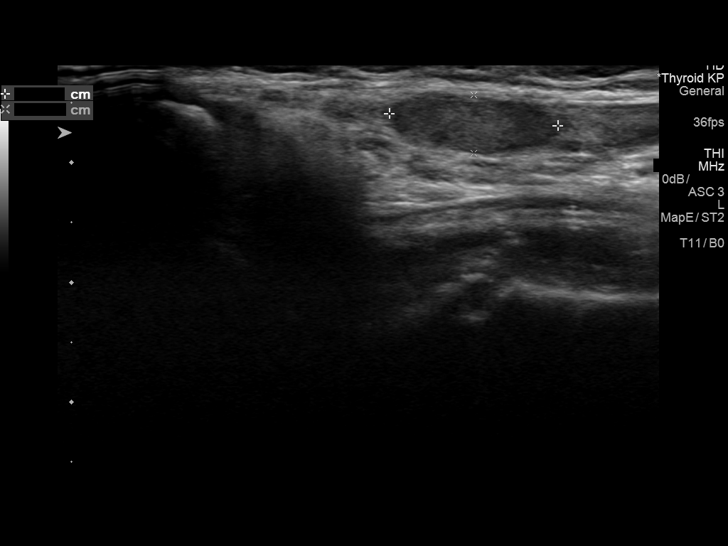
[im 36/46]
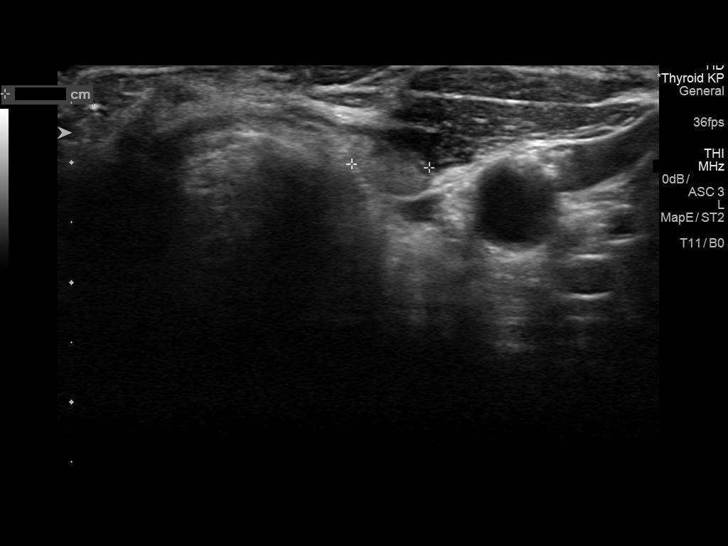
[im 40/46]
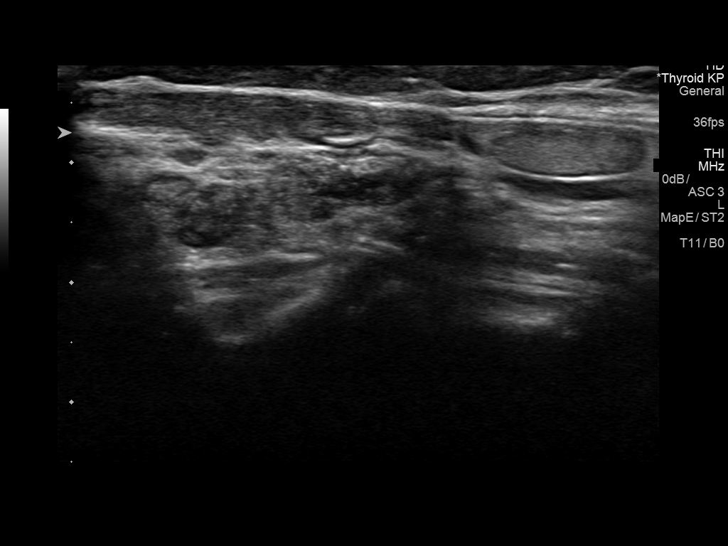
[im 44/46]
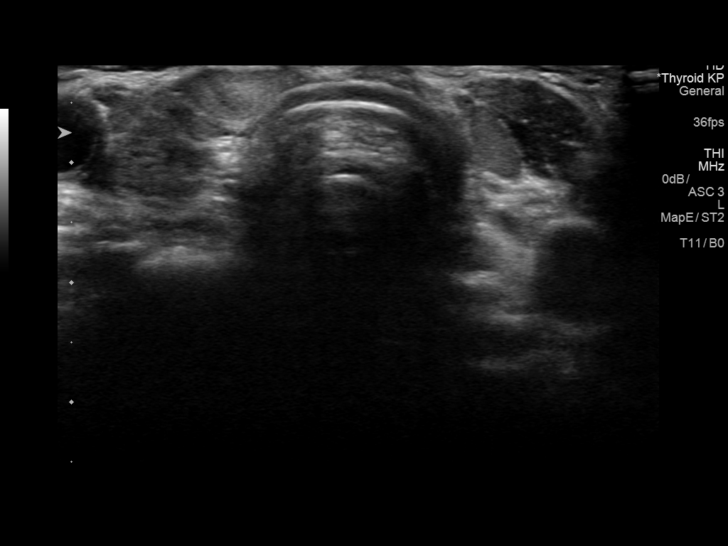

[12 of 25 positions shown; findings below may reference images not displayed]

FINDINGS: Parenchymal Echotexture: Markedly heterogenous

Isthmus: 0.2 cm, previously 0.1 cm

Right lobe: 4.5 x 1.1 x 1.3 cm, previously 5.9 x 0.9 x 1.5 cm

Left lobe: 3.3 x 0.9 x 1.1 cm, previously 3.3 x 0.9 x 1.3 cm

_________________________________________________________

Estimated total number of nodules >/= 1 cm: 3

Number of spongiform nodules >/=  2 cm not described below (TR1): 0

Number of mixed cystic and solid nodules >/= 1.5 cm not described
below (TR2): 0

_________________________________________________________

Nodule # 1:

Location: Right; Mid

Maximum size: 0.8 cm; Other 2 dimensions: 0.7 x 0.6 cm

Composition: solid/almost completely solid (2)

Echogenicity: hyperechoic (1)

Shape: taller-than-wide (3)

Margins: smooth (0)

Echogenic foci: none (0)

ACR TI-RADS total points: 6.

ACR TI-RADS risk category: TR4 (4-6 points).

ACR TI-RADS recommendations:

Given size (<0.9 cm) and appearance, this nodule does NOT meet
TI-RADS criteria for biopsy or dedicated follow-up.

_________________________________________________________

Nodule # 2:

Prior biopsy: No

Location: Isthmus; Mid

Maximum size: 1.5 cm; Other 2 dimensions: 0.4 x 1.0 cm, previously,
1.3 cm

Composition: solid/almost completely solid (2)

Echogenicity: hyperechoic (1)

Shape: not taller-than-wide (0)

Margins: smooth (0)

Echogenic foci: none (0)

ACR TI-RADS total points: 3.

ACR TI-RADS risk category:  TR3 (3 points).

Significant change in size (>/= 20% in two dimensions and minimal
increase of 2 mm): No

Change in features: No

Change in ACR TI-RADS risk category: No

ACR TI-RADS recommendations:

*Given size (>/= 1.5 - 2.4 cm) and appearance, a follow-up
ultrasound in 1 year should be considered based on TI-RADS criteria.
This nodule has not significantly changed.

_________________________________________________________

Nodule # 3:

Location: Isthmus; Mid

Maximum size: 1.4 cm; Other 2 dimensions: 0.5 x 0.7 cm

Composition: solid/almost completely solid (2)

Echogenicity: hypoechoic (2)

Shape: not taller-than-wide (0)

Margins: smooth (0)

Echogenic foci: none (0)

ACR TI-RADS total points: 4.

ACR TI-RADS risk category: TR4 (4-6 points).

ACR TI-RADS recommendations:

*Given size (>/= 1 - 1.4 cm) and appearance, a follow-up ultrasound
in 1 year should be considered based on TI-RADS criteria.

_________________________________________________________

Other nodules are small and have a benign appearance.

_________________________________________________________
IMPRESSION: Nodule 1 is stable.

Nodule 2 is stable and meets criteria for annual follow-up.

Nodule 3 is new and meets criteria for annual follow-up.

Other nodules are small and have a benign appearance.

The above is in keeping with the ACR TI-RADS recommendations - [HOSPITAL] 3773;[DATE].

## 2018-05-16 DIAGNOSIS — H18231 Secondary corneal edema, right eye: Secondary | ICD-10-CM | POA: Diagnosis not present

## 2018-05-16 DIAGNOSIS — Z961 Presence of intraocular lens: Secondary | ICD-10-CM | POA: Diagnosis not present

## 2018-05-16 DIAGNOSIS — H401113 Primary open-angle glaucoma, right eye, severe stage: Secondary | ICD-10-CM | POA: Diagnosis not present

## 2018-05-16 DIAGNOSIS — H401124 Primary open-angle glaucoma, left eye, indeterminate stage: Secondary | ICD-10-CM | POA: Diagnosis not present

## 2018-05-16 DIAGNOSIS — H35351 Cystoid macular degeneration, right eye: Secondary | ICD-10-CM | POA: Diagnosis not present

## 2018-06-12 DIAGNOSIS — H1820 Unspecified corneal edema: Secondary | ICD-10-CM | POA: Diagnosis not present

## 2018-06-12 DIAGNOSIS — Z961 Presence of intraocular lens: Secondary | ICD-10-CM | POA: Diagnosis not present

## 2018-06-12 DIAGNOSIS — H35351 Cystoid macular degeneration, right eye: Secondary | ICD-10-CM | POA: Diagnosis not present

## 2018-06-12 DIAGNOSIS — H18231 Secondary corneal edema, right eye: Secondary | ICD-10-CM | POA: Diagnosis not present

## 2018-06-12 DIAGNOSIS — H401113 Primary open-angle glaucoma, right eye, severe stage: Secondary | ICD-10-CM | POA: Diagnosis not present

## 2018-06-12 DIAGNOSIS — H401124 Primary open-angle glaucoma, left eye, indeterminate stage: Secondary | ICD-10-CM | POA: Diagnosis not present

## 2018-06-17 DIAGNOSIS — H401113 Primary open-angle glaucoma, right eye, severe stage: Secondary | ICD-10-CM | POA: Diagnosis not present

## 2018-06-17 DIAGNOSIS — H401124 Primary open-angle glaucoma, left eye, indeterminate stage: Secondary | ICD-10-CM | POA: Diagnosis not present

## 2018-07-04 DIAGNOSIS — H1820 Unspecified corneal edema: Secondary | ICD-10-CM | POA: Diagnosis not present

## 2018-07-04 DIAGNOSIS — H401113 Primary open-angle glaucoma, right eye, severe stage: Secondary | ICD-10-CM | POA: Diagnosis not present

## 2018-07-04 DIAGNOSIS — H401124 Primary open-angle glaucoma, left eye, indeterminate stage: Secondary | ICD-10-CM | POA: Diagnosis not present

## 2018-07-04 DIAGNOSIS — H35351 Cystoid macular degeneration, right eye: Secondary | ICD-10-CM | POA: Diagnosis not present

## 2018-07-04 DIAGNOSIS — Z961 Presence of intraocular lens: Secondary | ICD-10-CM | POA: Diagnosis not present

## 2018-09-04 DIAGNOSIS — H401124 Primary open-angle glaucoma, left eye, indeterminate stage: Secondary | ICD-10-CM | POA: Diagnosis not present

## 2018-09-04 DIAGNOSIS — H35351 Cystoid macular degeneration, right eye: Secondary | ICD-10-CM | POA: Diagnosis not present

## 2018-09-04 DIAGNOSIS — H1820 Unspecified corneal edema: Secondary | ICD-10-CM | POA: Diagnosis not present

## 2018-09-04 DIAGNOSIS — Z961 Presence of intraocular lens: Secondary | ICD-10-CM | POA: Diagnosis not present

## 2018-09-04 DIAGNOSIS — H401113 Primary open-angle glaucoma, right eye, severe stage: Secondary | ICD-10-CM | POA: Diagnosis not present

## 2018-09-25 DIAGNOSIS — H401124 Primary open-angle glaucoma, left eye, indeterminate stage: Secondary | ICD-10-CM | POA: Diagnosis not present

## 2018-09-25 DIAGNOSIS — H401113 Primary open-angle glaucoma, right eye, severe stage: Secondary | ICD-10-CM | POA: Diagnosis not present

## 2018-10-02 DIAGNOSIS — Z961 Presence of intraocular lens: Secondary | ICD-10-CM | POA: Diagnosis not present

## 2018-10-02 DIAGNOSIS — H35351 Cystoid macular degeneration, right eye: Secondary | ICD-10-CM | POA: Diagnosis not present

## 2018-10-02 DIAGNOSIS — H401124 Primary open-angle glaucoma, left eye, indeterminate stage: Secondary | ICD-10-CM | POA: Diagnosis not present

## 2018-10-02 DIAGNOSIS — H1820 Unspecified corneal edema: Secondary | ICD-10-CM | POA: Diagnosis not present

## 2018-10-02 DIAGNOSIS — H401113 Primary open-angle glaucoma, right eye, severe stage: Secondary | ICD-10-CM | POA: Diagnosis not present

## 2018-10-10 DIAGNOSIS — H35351 Cystoid macular degeneration, right eye: Secondary | ICD-10-CM | POA: Diagnosis not present

## 2018-10-10 DIAGNOSIS — Z961 Presence of intraocular lens: Secondary | ICD-10-CM | POA: Diagnosis not present

## 2018-10-10 DIAGNOSIS — H401124 Primary open-angle glaucoma, left eye, indeterminate stage: Secondary | ICD-10-CM | POA: Diagnosis not present

## 2018-10-10 DIAGNOSIS — H1820 Unspecified corneal edema: Secondary | ICD-10-CM | POA: Diagnosis not present

## 2018-10-10 DIAGNOSIS — H401113 Primary open-angle glaucoma, right eye, severe stage: Secondary | ICD-10-CM | POA: Diagnosis not present

## 2018-11-14 DIAGNOSIS — E041 Nontoxic single thyroid nodule: Secondary | ICD-10-CM | POA: Diagnosis not present

## 2018-11-14 DIAGNOSIS — M810 Age-related osteoporosis without current pathological fracture: Secondary | ICD-10-CM | POA: Diagnosis not present

## 2018-11-14 DIAGNOSIS — E039 Hypothyroidism, unspecified: Secondary | ICD-10-CM | POA: Diagnosis not present

## 2018-11-14 DIAGNOSIS — I10 Essential (primary) hypertension: Secondary | ICD-10-CM | POA: Diagnosis not present

## 2018-11-19 DIAGNOSIS — M810 Age-related osteoporosis without current pathological fracture: Secondary | ICD-10-CM | POA: Diagnosis not present

## 2018-11-19 DIAGNOSIS — Z85810 Personal history of malignant neoplasm of tongue: Secondary | ICD-10-CM | POA: Diagnosis not present

## 2018-11-19 DIAGNOSIS — E039 Hypothyroidism, unspecified: Secondary | ICD-10-CM | POA: Diagnosis not present

## 2018-11-19 DIAGNOSIS — R69 Illness, unspecified: Secondary | ICD-10-CM | POA: Diagnosis not present

## 2018-11-19 DIAGNOSIS — J449 Chronic obstructive pulmonary disease, unspecified: Secondary | ICD-10-CM | POA: Diagnosis not present

## 2018-11-19 DIAGNOSIS — H5702 Anisocoria: Secondary | ICD-10-CM | POA: Diagnosis not present

## 2018-11-19 DIAGNOSIS — I10 Essential (primary) hypertension: Secondary | ICD-10-CM | POA: Diagnosis not present

## 2018-11-19 DIAGNOSIS — E049 Nontoxic goiter, unspecified: Secondary | ICD-10-CM | POA: Diagnosis not present

## 2018-11-19 DIAGNOSIS — Z0000 Encounter for general adult medical examination without abnormal findings: Secondary | ICD-10-CM | POA: Diagnosis not present

## 2018-12-16 DIAGNOSIS — H401113 Primary open-angle glaucoma, right eye, severe stage: Secondary | ICD-10-CM | POA: Diagnosis not present

## 2018-12-16 DIAGNOSIS — H18231 Secondary corneal edema, right eye: Secondary | ICD-10-CM | POA: Diagnosis not present

## 2018-12-16 DIAGNOSIS — H401124 Primary open-angle glaucoma, left eye, indeterminate stage: Secondary | ICD-10-CM | POA: Diagnosis not present

## 2018-12-24 DIAGNOSIS — Z01419 Encounter for gynecological examination (general) (routine) without abnormal findings: Secondary | ICD-10-CM | POA: Diagnosis not present

## 2018-12-24 DIAGNOSIS — Z1212 Encounter for screening for malignant neoplasm of rectum: Secondary | ICD-10-CM | POA: Diagnosis not present

## 2018-12-24 DIAGNOSIS — R69 Illness, unspecified: Secondary | ICD-10-CM | POA: Diagnosis not present

## 2019-01-06 DIAGNOSIS — H524 Presbyopia: Secondary | ICD-10-CM | POA: Diagnosis not present

## 2019-01-16 DIAGNOSIS — H18231 Secondary corneal edema, right eye: Secondary | ICD-10-CM | POA: Diagnosis not present

## 2019-01-16 DIAGNOSIS — H401113 Primary open-angle glaucoma, right eye, severe stage: Secondary | ICD-10-CM | POA: Diagnosis not present

## 2019-01-16 DIAGNOSIS — H401124 Primary open-angle glaucoma, left eye, indeterminate stage: Secondary | ICD-10-CM | POA: Diagnosis not present

## 2019-01-16 DIAGNOSIS — H35351 Cystoid macular degeneration, right eye: Secondary | ICD-10-CM | POA: Diagnosis not present

## 2019-01-16 DIAGNOSIS — Z961 Presence of intraocular lens: Secondary | ICD-10-CM | POA: Diagnosis not present

## 2019-01-22 DIAGNOSIS — I10 Essential (primary) hypertension: Secondary | ICD-10-CM | POA: Diagnosis not present

## 2019-01-22 DIAGNOSIS — R69 Illness, unspecified: Secondary | ICD-10-CM | POA: Diagnosis not present

## 2019-02-05 DIAGNOSIS — H35351 Cystoid macular degeneration, right eye: Secondary | ICD-10-CM | POA: Diagnosis not present

## 2019-02-05 DIAGNOSIS — H401124 Primary open-angle glaucoma, left eye, indeterminate stage: Secondary | ICD-10-CM | POA: Diagnosis not present

## 2019-02-05 DIAGNOSIS — Z961 Presence of intraocular lens: Secondary | ICD-10-CM | POA: Diagnosis not present

## 2019-02-05 DIAGNOSIS — H401113 Primary open-angle glaucoma, right eye, severe stage: Secondary | ICD-10-CM | POA: Diagnosis not present

## 2019-02-05 DIAGNOSIS — H18231 Secondary corneal edema, right eye: Secondary | ICD-10-CM | POA: Diagnosis not present

## 2019-02-18 DIAGNOSIS — N368 Other specified disorders of urethra: Secondary | ICD-10-CM | POA: Diagnosis not present

## 2019-02-18 DIAGNOSIS — I10 Essential (primary) hypertension: Secondary | ICD-10-CM | POA: Diagnosis not present

## 2019-02-18 DIAGNOSIS — R69 Illness, unspecified: Secondary | ICD-10-CM | POA: Diagnosis not present

## 2019-02-24 DIAGNOSIS — N362 Urethral caruncle: Secondary | ICD-10-CM | POA: Diagnosis not present

## 2019-03-03 DIAGNOSIS — I10 Essential (primary) hypertension: Secondary | ICD-10-CM | POA: Diagnosis not present

## 2019-04-01 DIAGNOSIS — I10 Essential (primary) hypertension: Secondary | ICD-10-CM | POA: Diagnosis not present

## 2019-04-01 DIAGNOSIS — Z23 Encounter for immunization: Secondary | ICD-10-CM | POA: Diagnosis not present

## 2019-04-21 DIAGNOSIS — H401124 Primary open-angle glaucoma, left eye, indeterminate stage: Secondary | ICD-10-CM | POA: Diagnosis not present

## 2019-04-21 DIAGNOSIS — H401113 Primary open-angle glaucoma, right eye, severe stage: Secondary | ICD-10-CM | POA: Diagnosis not present

## 2019-04-22 DIAGNOSIS — I10 Essential (primary) hypertension: Secondary | ICD-10-CM | POA: Diagnosis not present

## 2019-04-22 DIAGNOSIS — E039 Hypothyroidism, unspecified: Secondary | ICD-10-CM | POA: Diagnosis not present

## 2019-04-22 DIAGNOSIS — R69 Illness, unspecified: Secondary | ICD-10-CM | POA: Diagnosis not present

## 2019-04-28 DIAGNOSIS — Z961 Presence of intraocular lens: Secondary | ICD-10-CM | POA: Diagnosis not present

## 2019-04-28 DIAGNOSIS — H401124 Primary open-angle glaucoma, left eye, indeterminate stage: Secondary | ICD-10-CM | POA: Diagnosis not present

## 2019-04-28 DIAGNOSIS — H35351 Cystoid macular degeneration, right eye: Secondary | ICD-10-CM | POA: Diagnosis not present

## 2019-04-28 DIAGNOSIS — H1820 Unspecified corneal edema: Secondary | ICD-10-CM | POA: Diagnosis not present

## 2019-04-28 DIAGNOSIS — H401113 Primary open-angle glaucoma, right eye, severe stage: Secondary | ICD-10-CM | POA: Diagnosis not present

## 2019-05-20 ENCOUNTER — Other Ambulatory Visit: Payer: Self-pay

## 2019-05-20 DIAGNOSIS — Z20822 Contact with and (suspected) exposure to covid-19: Secondary | ICD-10-CM

## 2019-05-20 DIAGNOSIS — R69 Illness, unspecified: Secondary | ICD-10-CM | POA: Diagnosis not present

## 2019-05-20 DIAGNOSIS — I10 Essential (primary) hypertension: Secondary | ICD-10-CM | POA: Diagnosis not present

## 2019-05-21 LAB — NOVEL CORONAVIRUS, NAA: SARS-CoV-2, NAA: NOT DETECTED

## 2019-06-30 DIAGNOSIS — N362 Urethral caruncle: Secondary | ICD-10-CM | POA: Diagnosis not present

## 2019-07-11 DIAGNOSIS — H26492 Other secondary cataract, left eye: Secondary | ICD-10-CM | POA: Diagnosis not present

## 2019-07-17 DIAGNOSIS — Z961 Presence of intraocular lens: Secondary | ICD-10-CM | POA: Diagnosis not present

## 2019-07-17 DIAGNOSIS — H401121 Primary open-angle glaucoma, left eye, mild stage: Secondary | ICD-10-CM | POA: Diagnosis not present

## 2019-07-17 DIAGNOSIS — H401113 Primary open-angle glaucoma, right eye, severe stage: Secondary | ICD-10-CM | POA: Diagnosis not present

## 2019-07-17 DIAGNOSIS — H35351 Cystoid macular degeneration, right eye: Secondary | ICD-10-CM | POA: Diagnosis not present

## 2020-05-25 ENCOUNTER — Emergency Department (HOSPITAL_COMMUNITY): Payer: Medicare HMO

## 2020-05-25 ENCOUNTER — Encounter (HOSPITAL_COMMUNITY): Payer: Self-pay | Admitting: Emergency Medicine

## 2020-05-25 ENCOUNTER — Other Ambulatory Visit: Payer: Self-pay

## 2020-05-25 ENCOUNTER — Inpatient Hospital Stay (HOSPITAL_COMMUNITY)
Admission: EM | Admit: 2020-05-25 | Discharge: 2020-05-28 | DRG: 177 | Disposition: A | Discharge status: Home Health | Payer: Medicare HMO | Attending: Internal Medicine | Admitting: Internal Medicine

## 2020-05-25 DIAGNOSIS — Z7983 Long term (current) use of bisphosphonates: Secondary | ICD-10-CM

## 2020-05-25 DIAGNOSIS — G3184 Mild cognitive impairment, so stated: Secondary | ICD-10-CM | POA: Diagnosis present

## 2020-05-25 DIAGNOSIS — U071 COVID-19: Principal | ICD-10-CM | POA: Diagnosis present

## 2020-05-25 DIAGNOSIS — J449 Chronic obstructive pulmonary disease, unspecified: Secondary | ICD-10-CM | POA: Diagnosis present

## 2020-05-25 DIAGNOSIS — R4189 Other symptoms and signs involving cognitive functions and awareness: Secondary | ICD-10-CM | POA: Diagnosis present

## 2020-05-25 DIAGNOSIS — J1282 Pneumonia due to coronavirus disease 2019: Secondary | ICD-10-CM | POA: Diagnosis present

## 2020-05-25 DIAGNOSIS — Z79899 Other long term (current) drug therapy: Secondary | ICD-10-CM

## 2020-05-25 DIAGNOSIS — E785 Hyperlipidemia, unspecified: Secondary | ICD-10-CM | POA: Diagnosis present

## 2020-05-25 DIAGNOSIS — Z79890 Hormone replacement therapy (postmenopausal): Secondary | ICD-10-CM

## 2020-05-25 DIAGNOSIS — I10 Essential (primary) hypertension: Secondary | ICD-10-CM | POA: Diagnosis present

## 2020-05-25 DIAGNOSIS — Z973 Presence of spectacles and contact lenses: Secondary | ICD-10-CM

## 2020-05-25 DIAGNOSIS — M810 Age-related osteoporosis without current pathological fracture: Secondary | ICD-10-CM | POA: Diagnosis present

## 2020-05-25 DIAGNOSIS — H4010X0 Unspecified open-angle glaucoma, stage unspecified: Secondary | ICD-10-CM | POA: Diagnosis present

## 2020-05-25 DIAGNOSIS — Z85819 Personal history of malignant neoplasm of unspecified site of lip, oral cavity, and pharynx: Secondary | ICD-10-CM

## 2020-05-25 DIAGNOSIS — E039 Hypothyroidism, unspecified: Secondary | ICD-10-CM | POA: Diagnosis present

## 2020-05-25 DIAGNOSIS — F0390 Unspecified dementia without behavioral disturbance: Secondary | ICD-10-CM | POA: Diagnosis present

## 2020-05-25 DIAGNOSIS — Z8349 Family history of other endocrine, nutritional and metabolic diseases: Secondary | ICD-10-CM

## 2020-05-25 DIAGNOSIS — Z87891 Personal history of nicotine dependence: Secondary | ICD-10-CM

## 2020-05-25 DIAGNOSIS — Z888 Allergy status to other drugs, medicaments and biological substances status: Secondary | ICD-10-CM

## 2020-05-25 LAB — CBC WITH DIFFERENTIAL/PLATELET
Abs Immature Granulocytes: 0.01 10*3/uL (ref 0.00–0.07)
Basophils Absolute: 0 10*3/uL (ref 0.0–0.1)
Basophils Relative: 0 %
Eosinophils Absolute: 0 10*3/uL (ref 0.0–0.5)
Eosinophils Relative: 0 %
HCT: 39.1 % (ref 36.0–46.0)
Hemoglobin: 12.7 g/dL (ref 12.0–15.0)
Immature Granulocytes: 0 %
Lymphocytes Relative: 11 %
Lymphs Abs: 0.8 10*3/uL (ref 0.7–4.0)
MCH: 29.7 pg (ref 26.0–34.0)
MCHC: 32.5 g/dL (ref 30.0–36.0)
MCV: 91.6 fL (ref 80.0–100.0)
Monocytes Absolute: 1.1 10*3/uL — ABNORMAL HIGH (ref 0.1–1.0)
Monocytes Relative: 17 %
Neutro Abs: 4.9 10*3/uL (ref 1.7–7.7)
Neutrophils Relative %: 72 %
Platelets: 266 10*3/uL (ref 150–400)
RBC: 4.27 MIL/uL (ref 3.87–5.11)
RDW: 13.5 % (ref 11.5–15.5)
WBC: 6.8 10*3/uL (ref 4.0–10.5)
nRBC: 0 % (ref 0.0–0.2)

## 2020-05-25 LAB — COMPREHENSIVE METABOLIC PANEL
ALT: 44 U/L (ref 0–44)
AST: 64 U/L — ABNORMAL HIGH (ref 15–41)
Albumin: 3.9 g/dL (ref 3.5–5.0)
Alkaline Phosphatase: 72 U/L (ref 38–126)
Anion gap: 11 (ref 5–15)
BUN: 18 mg/dL (ref 8–23)
CO2: 23 mmol/L (ref 22–32)
Calcium: 9 mg/dL (ref 8.9–10.3)
Chloride: 102 mmol/L (ref 98–111)
Creatinine, Ser: 1.05 mg/dL — ABNORMAL HIGH (ref 0.44–1.00)
GFR, Estimated: 54 mL/min — ABNORMAL LOW (ref >60)
Glucose, Bld: 101 mg/dL — ABNORMAL HIGH (ref 70–99)
Potassium: 3.6 mmol/L (ref 3.5–5.1)
Sodium: 136 mmol/L (ref 135–145)
Total Bilirubin: 0.5 mg/dL (ref 0.3–1.2)
Total Protein: 7.5 g/dL (ref 6.5–8.1)

## 2020-05-25 LAB — RESPIRATORY PANEL BY RT PCR (FLU A&B, COVID)
Influenza A by PCR: NEGATIVE
Influenza B by PCR: NEGATIVE
SARS Coronavirus 2 by RT PCR: POSITIVE — AB

## 2020-05-25 LAB — FIBRINOGEN: Fibrinogen: 436 mg/dL (ref 210–475)

## 2020-05-25 LAB — D-DIMER, QUANTITATIVE: D-Dimer, Quant: 0.98 ug/mL-FEU — ABNORMAL HIGH (ref 0.00–0.50)

## 2020-05-25 LAB — FERRITIN: Ferritin: 80 ng/mL (ref 11–307)

## 2020-05-25 LAB — PROCALCITONIN: Procalcitonin: <0.1 ng/mL

## 2020-05-25 LAB — LACTATE DEHYDROGENASE: LDH: 164 U/L (ref 98–192)

## 2020-05-25 LAB — LACTIC ACID, PLASMA: Lactic Acid, Venous: 2.3 mmol/L (ref 0.5–1.9)

## 2020-05-25 LAB — C-REACTIVE PROTEIN: CRP: 0.7 mg/dL (ref <1.0)

## 2020-05-25 LAB — TRIGLYCERIDES: Triglycerides: 53 mg/dL (ref <150)

## 2020-05-25 MED ORDER — METHYLPREDNISOLONE SODIUM SUCC 125 MG IJ SOLR: 125.0000 mg | Freq: Once | INTRAMUSCULAR | Status: DC | PRN

## 2020-05-25 MED ORDER — DIPHENHYDRAMINE HCL 50 MG/ML IJ SOLN: 50.0000 mg | Freq: Once | INTRAMUSCULAR | Status: DC | PRN

## 2020-05-25 MED ORDER — SODIUM CHLORIDE 0.9 % IV SOLN
Freq: Once | INTRAVENOUS | Status: AC
  Filled 2020-05-25: qty 5

## 2020-05-25 MED ORDER — ALBUTEROL SULFATE HFA 108 (90 BASE) MCG/ACT IN AERS: 2.0000 | INHALATION_SPRAY | Freq: Once | RESPIRATORY_TRACT | Status: DC | PRN

## 2020-05-25 MED ORDER — SODIUM CHLORIDE 0.9 % IV BOLUS
500.0000 mL | Freq: Once | INTRAVENOUS | Status: AC
  Administered 2020-05-25: 500 mL via INTRAVENOUS

## 2020-05-25 MED ORDER — FAMOTIDINE IN NACL 20-0.9 MG/50ML-% IV SOLN: 20.0000 mg | Freq: Once | INTRAVENOUS | Status: DC | PRN

## 2020-05-25 MED ORDER — SODIUM CHLORIDE 0.9 % IV SOLN: INTRAVENOUS | Status: DC | PRN

## 2020-05-25 MED ORDER — EPINEPHRINE 0.3 MG/0.3ML IJ SOAJ
0.3000 mg | Freq: Once | INTRAMUSCULAR | Status: DC | PRN
  Filled 2020-05-25: qty 0.6

## 2020-05-25 NOTE — Consult Note (Addendum)
Medical Consultation   AARADHYA Grant  QVZ:[id number]  DOB: 1941-02-22  DOA: 05/25/2020  PCP: Deland Pretty, MD    Requesting physician: Dr. Jeanell Sparrow  Reason for consultation: COVID-19   History of Present Illness: [name] is an 79 y.o. female with h/o MCI, COPD.  Lives at home with son who is care giver most of the time.  Son is out of state at this time, daughter had come this weekend to visit patient.  Unfortunately daughter was diagnosed with COVID today.  Patient began having some coughing and fever today.  Family friend came over and checked her temperature and it was 101.  EMS called.  EMS noted fever of 10.9.  O2 sat of 94% on RA.  Currently pt has no complaints in the ED satting mid 90s on RA.  Denies headache, dyspnea, chest pain, abd pain, N/V/D, UTI symptoms.  Work up in the ED: COVID positive, CXR showing few subtle patchy opacities in R and L infrahilar lung.  Procalcitonin neg, WBC nl, Lactate 2.3, CRP 0.7.  Pt given 500cc bolus.  EDP is concerned because pt currently has no one at home to help care for her or watch her if her COVID illness were to become worse.    Review of Systems:  ROS As per HPI otherwise 10 point review of systems negative.     Past Medical History: Past Medical History:  Diagnosis Date  . Cancer (HCC)    scc of oral cavity  . Cataract   . Glaucoma   . Hypertension   . Osteoporosis   . Thyroid disease   . Wears glasses     Past Surgical History: Past Surgical History:  Procedure Laterality Date  . CESAREAN SECTION  1978   TWINS, ONE WAS STILLBORN  . MOUTH SURGERY  1996  . NODULE REMOVED     BENIGN, LOWER NECK     Allergies:   Allergies  Allergen Reactions  . Brimonidine Itching    Red eyes  . Naphazoline-Polyethyl Glycol     THAT YOU USE TO DILATE YOUR EYES.Marland KitchenMAKES BP GO UP, CAN ONLY USE HALF STRENGTH  . Tropicamide Other (See Comments)    Elevates IOP OK to use 0.5%     Social History:   reports that she quit smoking about 25 years ago. Her smoking use included cigarettes. She has a 0.40 pack-year smoking history. She has never used smokeless tobacco. She reports that she does not drink alcohol and does not use drugs.   Family History: Family History  Problem Relation Age of Onset  . Thyroid disease Mother   . Thyroid disease Sister      Physical Exam: Vitals:   05/25/20 2045 05/25/20 2100 05/25/20 2227 05/25/20 2230  BP: (!) 145/83 (!) 154/79 140/89 138/63  Pulse: 98 91 91 87  Resp: 13 (!) 29 (!) 22 (!) 24  Temp:      TempSrc:      SpO2: 93% 98% 96% 91%  Weight:      Height:        Constitutional:  Alert and awake, not in any acute distress. Eyes: PERLA, EOMI, irises appear normal, anicteric sclera,  ENMT: external ears and nose appear normal,            Lips appears normal, oropharynx mucosa, tongue, posterior pharynx appear normal  Neck: neck appears normal, no masses, normal ROM, no thyromegaly, no  JVD  CVS: S1-S2 clear, no murmur rubs or gallops, no LE edema, normal pedal pulses  Respiratory:  clear to auscultation bilaterally, no wheezing, rales or rhonchi. Respiratory effort normal. No accessory muscle use.  Abdomen: soft nontender, nondistended, normal bowel sounds, no hepatosplenomegaly, no hernias  Musculoskeletal: : no cyanosis, clubbing or edema noted bilaterally Neuro: Cranial nerves II-XII intact, strength, sensation, reflexes Psych: Oriented to self, situation, not time. Skin: no rashes or lesions or ulcers, no induration or nodules   Data reviewed:  I have personally reviewed following labs and imaging studies Labs:  CBC: Recent Labs  Lab 05/25/20 1953  WBC 6.8  NEUTROABS 4.9  HGB 12.7  HCT 39.1  MCV 91.6  PLT 024    Basic Metabolic Panel: Recent Labs  Lab 05/25/20 1953  NA 136  K 3.6  CL 102  CO2 23  GLUCOSE 101*  BUN 18  CREATININE 1.05*  CALCIUM 9.0   GFR Estimated Creatinine Clearance: 36.7 mL/min (A) (by C-G  formula based on SCr of 1.05 mg/dL (H)). Liver Function Tests: Recent Labs  Lab 05/25/20 1953  AST 64*  ALT 44  ALKPHOS 72  BILITOT 0.5  PROT 7.5  ALBUMIN 3.9   No results for input(s): LIPASE, AMYLASE in the last 168 hours. No results for input(s): AMMONIA in the last 168 hours. Coagulation profile No results for input(s): INR, PROTIME in the last 168 hours.  Cardiac Enzymes: No results for input(s): CKTOTAL, CKMB, CKMBINDEX, TROPONINI in the last 168 hours. BNP: Invalid input(s): POCBNP CBG: No results for input(s): GLUCAP in the last 168 hours. D-Dimer Recent Labs    05/25/20 1953  DDIMER 0.98*   Hgb A1c No results for input(s): HGBA1C in the last 72 hours. Lipid Profile Recent Labs    05/25/20 1953  TRIG 53   Thyroid function studies No results for input(s): TSH, T4TOTAL, T3FREE, THYROIDAB in the last 72 hours.  Invalid input(s): FREET3 Anemia work up [employer]    05/25/20 1953  FERRITIN 80   Urinalysis No results found for: COLORURINE, APPEARANCEUR, LABSPEC, [place], [place], [place], [place], KETONESUR, [place], UROBILINOGEN, NITRITE, [place]   Microbiology Recent Results (from the past 240 hour(s))  Respiratory Panel by RT PCR (Flu A&B, Covid) - Nasopharyngeal Swab     Status: Abnormal   Collection Time: 05/25/20  7:53 PM   Specimen: Nasopharyngeal Swab  Result Value Ref Range Status   SARS Coronavirus 2 by RT PCR POSITIVE (A) NEGATIVE Final    Comment: RESULT CALLED TO, READ BACK BY AND VERIFIED WITH: RAY D. 10.26.21 @ 0973 BY MECIAL J. (NOTE) SARS-CoV-2 target nucleic acids are DETECTED.  SARS-CoV-2 RNA is generally detectable in upper respiratory specimens  during the acute phase of infection. Positive results are indicative of the presence of the identified virus, but do not rule out bacterial infection or co-infection with other pathogens not detected by the test. Clinical correlation with patient history and other diagnostic  information is necessary to determine patient infection status. The expected result is Negative.  Fact Sheet for Patients:  PinkCheek.be  Fact Sheet for Healthcare Providers: GravelBags.it  This test is not yet approved or cleared by the [place] FDA and  has been authorized for detection and/or diagnosis of SARS-CoV-2 by FDA under an Emergency Use Authorization (EUA).  This EUA will remain in effect (meaning this test can be  used) for the duration of  the COVID-19 declaration under Section 564(b)(1) of the Act, 21 U.S.C. section 360bbb-3(b)(1), unless the  authorization is terminated or revoked sooner.      Influenza A by PCR NEGATIVE NEGATIVE Final   Influenza B by PCR NEGATIVE NEGATIVE Final    Comment: (NOTE) The Xpert Xpress SARS-CoV-2/FLU/RSV assay is intended as an aid in  the diagnosis of influenza from Nasopharyngeal swab specimens and  should not be used as a sole basis for treatment. Nasal washings and  aspirates are unacceptable for Xpert Xpress SARS-CoV-2/FLU/RSV  testing.  Fact Sheet for Patients: PinkCheek.be  Fact Sheet for Healthcare Providers: GravelBags.it  This test is not yet approved or cleared by the [place] FDA and  has been authorized for detection and/or diagnosis of SARS-CoV-2 by  FDA under an Emergency Use Authorization (EUA). This EUA will remain  in effect (meaning this test can be used) for the duration of the  Covid-19 declaration under Section 564(b)(1) of the Act, 21  U.S.C. section 360bbb-3(b)(1), unless the authorization is  terminated or revoked. Performed at [facility], [place] [address] [place], [place] [address]        Inpatient Medications:   Scheduled Meds: Continuous Infusions: . sodium chloride    . [START ON 05/26/2020] casirivimab-imdevimab IV    . famotidine (PEPCID) IV        Radiological Exams on Admission: DG Chest Port 1 View  Result Date: 05/25/2020 CLINICAL DATA:  Known COVID exposure, increasing lethargy EXAM: PORTABLE CHEST 1 VIEW COMPARISON:  None. FINDINGS: Hyperinflation of the lungs. Some coarsened interstitial features are present as well. 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 No pneumothorax or visible effusion. The aorta is calcified. The remaining cardiomediastinal contours are unremarkable. No acute osseous or soft tissue abnormality. Telemetry leads overlie the chest. IMPRESSION: Few subtle patchy opacities in the right and left infrahilar lung the left lung opacity may be accentuated by overlying breast tissue. Appearance suggesting prior right mastectomy, correlate with surgical history. Aortic Atherosclerosis (ICD10-I70.0). Electronically Signed   By: Lovena Le M.D.   On: 05/25/2020 19:28    Impression/Recommendations Principal Problem:   COVID-19 virus infection Active Problems:   COPD (chronic obstructive pulmonary disease) (HCC)   Open-angle glaucoma   MCI (mild cognitive impairment)  1. COVID-19 - 1. Pt with mild COVID-19 at this time. 2. Feel she would normally be a candidate for MAB therapy and discharge except for home social issues (Son just happens to be out of state right now), I do NOT want to change therapy recommendations or withhold potentially life saving therapy based solely on social issues. 3. Cant get a-hold of son or daughter despite multiple attempts. 4. Discussed MAB therapy with patient. 5. Therefore treating with MAB therapy now. 6. EDP trying to get a-hold of family to make sure she has someone at home to take care of her given risk for worsening COVID, acute illness, etc. 7. If unable to get a-hold of anyone at home, may need to keep  patient for the moment as observation status. 2. Glaucoma - 1. Cont home eye drops as per optho notes in Care everywhere. 3. HTN - 1. Cont home BP meds when med rec completed 4. MCI - 1. Chronic, stable  Time spent: 70 min.   Quintavia Rogstad M. D.O. [facility] 05/25/2020, 11:50 PM

## 2020-05-25 NOTE — ED Notes (Signed)
Date and time results received: 05/25/20 2105 (use smartphrase ".now" to insert current time)  Test: Lactic Acid  Critical Value: 2.3  Name of Provider Notified: Ray

## 2020-05-25 NOTE — ED Provider Notes (Signed)
[facility] DEPT Provider Note   CSN: [id number] Arrival date & time: 05/25/20  1826     History Chief Complaint  Patient presents with  . Fatigue    [name] is a 79 y.o. female.  HPI    79 year old female history of hypertension, early dementia, Covid exposure, presents today via EMS for cough and fever.  EMS reports that patient spent for the weekend with her daughter who was diagnosed with Covid today.  Patient began having some coughing and fever.  A family friend came over and checked her temperature and it was 101.  She called EMS.  EMS noted her fever to be 100.9, O2 sats 94% and otherwise patient hemodynamically stable.  Patient currently has no complaints.  She denies headache, dyspnea, chest pain, abdominal pain, nausea, vomiting, diarrhea, or UTI symptoms. Past Medical History:  Diagnosis Date  . Cancer (HCC)    scc of oral cavity  . Cataract   . Glaucoma   . Hypertension   . Osteoporosis   . Thyroid disease   . Wears glasses     Patient Active Problem List   Diagnosis Date Noted  . 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  . 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 COPD (chronic obstructive pulmonary disease) ([place]) 07/05/2012  . Multiple thyroid nodules 01/01/2012    Past Surgical History:  Procedure Laterality Date  . CESAREAN SECTION  1978   TWINS, ONE WAS STILLBORN  . MOUTH SURGERY  1996  . NODULE REMOVED     BENIGN, LOWER NECK     OB History   No obstetric history on file.     Family History  Problem Relation Age of Onset  . Thyroid disease Mother   . Thyroid disease Sister     Social History   Tobacco Use  . Smoking status: Former Smoker    Packs/day: 0.20    Years: 2.00    Pack years: 0.40    Types: Cigarettes    Quit date: 07/31/1994    Years since quitting: 25.8  . Smokeless tobacco:  Never Used  . Tobacco comment: 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  Substance Use Topics  . Alcohol use: No  . Drug use: No    Home Medications Prior to Admission medications   Medication Sig Start Date End Date Taking? Authorizing Provider  alendronate (FOSAMAX) 70 MG tablet Take 70 mg by mouth every 7 (seven) days. Take with a full glass of water on an empty stomach.    [provider]  Calcium Carbonate-Vitamin D (CALTRATE 600+D PO) Take 600 mg by mouth daily.     [provider]  Cholecalciferol (VITAMIN D-3 PO) Take 800 mg by mouth daily.     [provider]  dorzolamide-timolol (COSOPT) 22.3-6.8 MG/ML ophthalmic solution Place 1 drop into both eyes 2 (two) times daily.      [provider]  fish oil-omega-3 fatty acids 1000 MG capsule Take 1-2 g by mouth daily.     [provider]  levothyroxine (SYNTHROID) 88 MCG tablet Take 88 mcg by mouth daily.    [provider]  metoprolol succinate (TOPROL-XL) 50 MG 24 hr tablet Take 1 tablet by mouth daily. 05/05/12   [provider]  Multiple Vitamins-Minerals (CENTRUM SILVER PO) Take by mouth.      [provider]  PILOCARPINE HCL OP Place 1 drop into the right  eye 4 (four) times daily.    [provider]  prednisoLONE acetate (PRED FORTE) 1 % ophthalmic suspension Insert one drop into the surgical eye twice a day for 7 days, then stop. 04/08/15   [provider]  TRAVATAN Z 0.004 % SOLN ophthalmic solution Place 1 drop into both eyes At bedtime. 06/24/12   [provider]    Allergies    Brimonidine, Naphazoline-polyethyl glycol, and Tropicamide  Review of Systems   Review of Systems  All other systems reviewed and are negative.   Physical Exam Updated Vital Signs There were no vitals taken for this visit.  Physical Exam Vitals and nursing note reviewed.  Constitutional:      General: She is not in acute distress.    Appearance: Normal  appearance. She is not ill-appearing.  HENT:     Head: Normocephalic.     Right Ear: External ear normal.     Left Ear: External ear normal.     Nose: Nose normal.     Mouth/Throat:     Mouth: Mucous membranes are moist.  Eyes:     Extraocular Movements: Extraocular movements intact.     Pupils: Pupils are equal, round, and reactive to light.  Cardiovascular:     Rate and Rhythm: Normal rate and regular rhythm.     Pulses: Normal pulses.     Heart sounds: Normal heart sounds.  Pulmonary:     Effort: Pulmonary effort is normal.     Breath sounds: Normal breath sounds.  Abdominal:     General: Abdomen is flat. Bowel sounds are normal.     Palpations: Abdomen is soft.  Musculoskeletal:        General: Normal range of motion.     Cervical back: Normal range of motion.  Skin:    General: Skin is dry.  Neurological:     General: No focal deficit present.     Mental Status: She is alert.  Psychiatric:        Mood and Affect: Mood normal.     ED Results / Procedures / Treatments   Labs (all labs ordered are listed, but only abnormal results are displayed) Labs Reviewed  RESPIRATORY PANEL BY RT PCR (FLU A&B, COVID)  CULTURE, BLOOD (ROUTINE X 2)  CULTURE, BLOOD (ROUTINE X 2)  LACTIC ACID, PLASMA  LACTIC ACID, PLASMA  CBC WITH DIFFERENTIAL/PLATELET  COMPREHENSIVE METABOLIC PANEL  D-DIMER, QUANTITATIVE (NOT AT [facility])  PROCALCITONIN  LACTATE DEHYDROGENASE  FERRITIN  TRIGLYCERIDES  FIBRINOGEN  C-REACTIVE PROTEIN    EKG EKG Interpretation  Date/Time:  Tuesday May 25 2020 18:55:14 EDT Ventricular Rate:  91 PR Interval:    QRS Duration: 94 QT Interval:  356 QTC Calculation: 438 R Axis:   75 Text Interpretation: Sinus rhythm Anteroseptal infarct, age indeterminate No old tracing to compare Confirmed by Pattricia Boss [phone]) on 05/25/2020 7:55:45 PM   Radiology DG Chest Port 1 View  Result Date: 05/25/2020 CLINICAL DATA:  Known COVID exposure, increasing  lethargy EXAM: PORTABLE CHEST 1 VIEW COMPARISON:  None. FINDINGS: Hyperinflation of the lungs. Some coarsened interstitial features are present as well. 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 No pneumothorax or visible effusion. The aorta is calcified. The remaining cardiomediastinal contours are unremarkable. No acute osseous or soft tissue abnormality. Telemetry leads overlie the chest. IMPRESSION: Few subtle patchy  opacities in the right and left infrahilar lung the left lung opacity may be accentuated by overlying breast tissue. Appearance suggesting prior right mastectomy, correlate with surgical history. Aortic Atherosclerosis (ICD10-I70.0). Electronically Signed   By: Lovena Le M.D.   On: 05/25/2020 19:28    Procedures Procedures (including critical care time)  Medications Ordered in ED Medications - No data to display  ED Course  I have reviewed the triage vital signs and the nursing notes.  Pertinent labs & imaging results that were available during my care of the patient were reviewed by me and considered in my medical decision making (see chart for details).    MDM Rules/Calculators/A&P                          79 yo female with early dementia, presents today with cough, dyspnea, and covid exposure. Patient with multifocal infiltrates on cxr, decreased sats, and covid positive.  Mildly elevated lactic at 2.3 without other indications of sepsis, bp remains normal and normal hr.   Discussed with Dr. Alcario Drought who will see in consult  Discussed with Dr. Alcario Drought and attempted to contact daughter Caryl Pina at [phone] no answer.  Attempted to call son Lanny Hurst [phone]. Plan discharge-discussed with Dr. Dayna Barker and he will discharge after  mab infused. Final Clinical Impression(s) / ED Diagnoses Final diagnoses:    [id number]    Rx / DC Orders ED Discharge Orders    None       Pattricia Boss, MD 05/25/20 2338

## 2020-05-25 NOTE — ED Triage Notes (Signed)
Pt bib ems from home following known exposure to covid over the weekend. Pt endorses increased lethargy. Denies N/V/D, resp. Sx. A/Ox4. Early onset dementia.   EMS vitals:  128/69 HR 102 RR CBG109 100.20f

## 2020-05-26 DIAGNOSIS — H4010X0 Unspecified open-angle glaucoma, stage unspecified: Secondary | ICD-10-CM | POA: Diagnosis not present

## 2020-05-26 DIAGNOSIS — G3184 Mild cognitive impairment, so stated: Secondary | ICD-10-CM | POA: Diagnosis not present

## 2020-05-26 DIAGNOSIS — U071 COVID-19: Secondary | ICD-10-CM | POA: Diagnosis not present

## 2020-05-26 DIAGNOSIS — J449 Chronic obstructive pulmonary disease, unspecified: Secondary | ICD-10-CM

## 2020-05-26 MED ORDER — ACETAMINOPHEN 325 MG PO TABS: 650.0000 mg | ORAL_TABLET | Freq: Four times a day (QID) | ORAL | Status: DC | PRN

## 2020-05-26 MED ORDER — PREDNISOLONE ACETATE 1 % OP SUSP: 1.0000 [drp] | Freq: Two times a day (BID) | OPHTHALMIC | Status: DC

## 2020-05-26 MED ORDER — ONDANSETRON HCL 4 MG/2ML IJ SOLN: 4.0000 mg | Freq: Four times a day (QID) | INTRAMUSCULAR | Status: DC | PRN

## 2020-05-26 MED ORDER — DORZOLAMIDE HCL-TIMOLOL MAL 2-0.5 % OP SOLN: 1.0000 [drp] | Freq: Two times a day (BID) | OPHTHALMIC | Status: DC

## 2020-05-26 MED ORDER — ENOXAPARIN SODIUM 40 MG/0.4ML ~~LOC~~ SOLN
40.0000 mg | SUBCUTANEOUS | Status: DC
  Administered 2020-05-26 – 2020-05-28 (×3): 40 mg via SUBCUTANEOUS
  Filled 2020-05-26 (×4): qty 0.4

## 2020-05-26 MED ORDER — KETOROLAC TROMETHAMINE 0.5 % OP SOLN
1.0000 [drp] | Freq: Two times a day (BID) | OPHTHALMIC | Status: DC
  Administered 2020-05-26 – 2020-05-28 (×5): 1 [drp] via OPHTHALMIC
  Filled 2020-05-26: qty 5

## 2020-05-26 MED ORDER — ONDANSETRON HCL 4 MG PO TABS: 4.0000 mg | ORAL_TABLET | Freq: Four times a day (QID) | ORAL | Status: DC | PRN

## 2020-05-26 MED ORDER — LATANOPROST 0.005 % OP SOLN
1.0000 [drp] | Freq: Every day | OPHTHALMIC | Status: DC
  Administered 2020-05-26 – 2020-05-27 (×2): 1 [drp] via OPHTHALMIC
  Filled 2020-05-26: qty 2.5

## 2020-05-26 NOTE — ED Notes (Signed)
Attempted to call report to 5W. RN unable to take report at this time.

## 2020-05-26 NOTE — ED Notes (Signed)
Awaiting verification of eyedrops from pharmacy

## 2020-05-26 NOTE — ED Notes (Signed)
Eating breakfast and s/b provider.

## 2020-05-26 NOTE — ED Notes (Signed)
Attempt to call patient son. Voicemail left.

## 2020-05-26 NOTE — Plan of Care (Signed)
  Problem: Clinical Measurements: Goal: Ability to maintain clinical measurements within normal limits will improve Outcome: Progressing   

## 2020-05-26 NOTE — ED Notes (Signed)
Some 10 am meds given a/o. Awaiting remaining 10am meds from pharmacy.

## 2020-05-26 NOTE — ED Notes (Signed)
Pharmacy stated Combigan or steroid eye drop not yet verified b/c pt hasn't had them filled recently and med hx hasn't been updated. This Probation officer was told when pharmacist verifies them we will change the start time.

## 2020-05-26 NOTE — ED Notes (Signed)
Pt asleep and resting comfortably at this time.  

## 2020-05-26 NOTE — Progress Notes (Signed)
PROGRESS NOTE  [name]  GUR:[id number] DOB: [birth year] DOA: 05/25/2020 PCP: Deland Pretty, MD   Brief Narrative: ANGENETTE Grant is a 79 y.o. female with a history of early dementia, HTN, glaucoma who presented to the ED 10/26 with cough and fever after spending some time with her daughter who was later diagnosed with covid-19. CXR demonstrated mild opacities and SARS-CoV-2 PCR was positive. CRP, WBC, PCT were negative. Lactic acid 2.3. Monoclonal antibody was given and discharge was planned. Unfortunately, the patient is not felt to be safe at home alone and no family could be reached, so she was kept in the ED and admitted.   Assessment & Plan: Principal Problem:   COVID-19 virus infection Active Problems:   COPD (chronic obstructive pulmonary disease) (HCC)   Open-angle glaucoma   MCI (mild cognitive impairment)  Covid-19 pneumonia: SARS-CoV-2 PCR positive on 10/26. At high risk of hospitalization/death so was given monoclonal antibody 10/26. - Does not currently require steroids due to no hypoxemia. Evidence for remdesivir is lacking, and patient has no requirement for other authorized therapies.   - Continue airborne, contact precautions for 10 days from positive testing. - Enoxaparin prophylactic dose while admitted.  Early dementia/MCI:  - Delirium precautions. Continue attempts to reach out to family.   Glaucoma: Stable.  - Continue gtt's  DVT prophylaxis: Lovenox Code Status: Full Family Communication: None at bedside, we were unable to contact family listed in chart again today. Disposition Plan:  Status is: Observation  The patient will require care spanning > 2 midnights and should be moved to inpatient because: Unsafe d/c plan  Dispo: The patient is from: Home              Anticipated d/c is to: Home              Anticipated d/c date is: 1 day              Patient currently is medically stable to d/c.  Consultants:   None  Procedures:    None  Antimicrobials:  None   Subjective: Still with cough and diffuse weakness but states she was able to get up to bathroom. Feels mentally foggy. No other complaints.   Objective: Vitals:   05/26/20 1430 05/26/20 1431 05/26/20 1504 05/26/20 1600  BP: 121/74  135/79 132/70  Pulse: 95  88 80  Resp:   16   Temp:   99.6 F (37.6 C)   TempSrc:   Oral   SpO2: (!) 87% 91% 94% 92%  Weight:      Height:        Intake/Output Summary (Last 24 hours) at 05/26/2020 1638 Last data filed at 05/26/2020 0308 Gross per 24 hour  Intake 600 ml  Output --  Net 600 ml   Filed Weights   05/25/20 1844  Weight: 52.6 kg    [name]: 79 y.o. female in no distress Pulm: Non-labored breathing room air, 91-05%. Clear to auscultation bilaterally.  CV: Regular rate and rhythm. No murmur, rub, or gallop. No JVD, no pedal edema. GI: Abdomen soft, non-tender, non-distended, with normoactive bowel sounds. No organomegaly or masses felt. Ext: Warm, no deformities Skin: No rashes, lesions or ulcers Neuro: Alert and incompletely oriented. No focal neurological deficits. Psych: Judgement and insight appear fair-to slightly impaired. Mood & affect appropriate.   Data Reviewed: I have personally reviewed following labs and imaging studies  CBC: Recent Labs  Lab 05/25/20 1953  WBC 6.8  NEUTROABS 4.9  HGB  12.7  HCT 39.1  MCV 91.6  PLT 749   Basic Metabolic Panel: Recent Labs  Lab 05/25/20 1953  NA 136  K 3.6  CL 102  CO2 23  GLUCOSE 101*  BUN 18  CREATININE 1.05*  CALCIUM 9.0   GFR: Estimated Creatinine Clearance: 36.7 mL/min (A) (by C-G formula based on SCr of 1.05 mg/dL (H)). Liver Function Tests: Recent Labs  Lab 05/25/20 1953  AST 64*  ALT 44  ALKPHOS 72  BILITOT 0.5  PROT 7.5  ALBUMIN 3.9   No results for input(s): LIPASE, AMYLASE in the last 168 hours. No results for input(s): AMMONIA in the last 168 hours. Coagulation Profile: No results for input(s): INR, PROTIME  in the last 168 hours. Cardiac Enzymes: No results for input(s): CKTOTAL, CKMB, CKMBINDEX, TROPONINI in the last 168 hours. BNP (last 3 results) No results for input(s): PROBNP in the last 8760 hours. HbA1C: No results for input(s): HGBA1C in the last 72 hours. CBG: No results for input(s): GLUCAP in the last 168 hours. Lipid Profile: Recent Labs    05/25/20 1953  TRIG 53   Thyroid Function Tests: No results for input(s): TSH, T4TOTAL, FREET4, T3FREE, THYROIDAB in the last 72 hours. Anemia Panel: Recent Labs    05/25/20 1953  FERRITIN 80   Urine analysis: No results found for: COLORURINE, APPEARANCEUR, LABSPEC, PHURINE, GLUCOSEU, HGBUR, BILIRUBINUR, KETONESUR, PROTEINUR, UROBILINOGEN, NITRITE, LEUKOCYTESUR Recent Results (from the past 240 hour(s))  Respiratory Panel by RT PCR (Flu A&B, Covid) - Nasopharyngeal Swab     Status: Abnormal   Collection Time: 05/25/20  7:53 PM   Specimen: Nasopharyngeal Swab  Result Value Ref Range Status   SARS Coronavirus 2 by RT PCR POSITIVE (A) NEGATIVE Final    Comment: RESULT CALLED TO, READ BACK BY AND VERIFIED WITH: RAY D. 10.26.21 @ 4496 BY MECIAL J. (NOTE) SARS-CoV-2 target nucleic acids are DETECTED.  SARS-CoV-2 RNA is generally detectable in upper respiratory specimens  during the acute phase of infection. Positive results are indicative of the presence of the identified virus, but do not rule out bacterial infection or co-infection with other pathogens not detected by the test. Clinical correlation with patient history and other diagnostic information is necessary to determine patient infection status. The expected result is Negative.  Fact Sheet for Patients:  PinkCheek.be  Fact Sheet for Healthcare Providers: GravelBags.it  This test is not yet approved or cleared by the [place] FDA and  has been authorized for detection and/or diagnosis of SARS-CoV-2 by FDA  under an Emergency Use Authorization (EUA).  This EUA will remain in effect (meaning this test can be  used) for the duration of  the COVID-19 declaration under Section 564(b)(1) of the Act, 21 U.S.C. section 360bbb-3(b)(1), unless the authorization is terminated or revoked sooner.      Influenza A by PCR NEGATIVE NEGATIVE Final   Influenza B by PCR NEGATIVE NEGATIVE Final    Comment: (NOTE) The Xpert Xpress SARS-CoV-2/FLU/RSV assay is intended as an aid in  the diagnosis of influenza from Nasopharyngeal swab specimens and  should not be used as a sole basis for treatment. Nasal washings and  aspirates are unacceptable for Xpert Xpress SARS-CoV-2/FLU/RSV  testing.  Fact Sheet for Patients: PinkCheek.be  Fact Sheet for Healthcare Providers: GravelBags.it  This test is not yet approved or cleared by the [place] FDA and  has been authorized for detection and/or diagnosis of SARS-CoV-2 by  FDA under an Emergency Use Authorization (EUA). This EUA  will remain  in effect (meaning this test can be used) for the duration of the  Covid-19 declaration under Section 564(b)(1) of the Act, 21  U.S.C. section 360bbb-3(b)(1), unless the authorization is  terminated or revoked. Performed at [facility], [place] [address] [place], [place] [address]   Blood Culture (routine x 2)     Status: None (Preliminary result)   Collection Time: 05/25/20  7:53 PM   Specimen: BLOOD  Result Value Ref Range Status   Specimen Description   Final    BLOOD LEFT WRIST Performed at [facility] [address], [place], [place] [address]    Special Requests   Final    BOTTLES DRAWN AEROBIC AND ANAEROBIC Blood Culture adequate volume Performed at [facility] [address], [place], [place] [address]    Culture   Final    NO GROWTH < 12 HOURS Performed at [facility]  [address] [place], [place] [address]    Report Status PENDING  Incomplete  Blood Culture (routine x 2)     Status: None (Preliminary result)   Collection Time: 05/25/20  7:53 PM   Specimen: BLOOD  Result Value Ref Range Status   Specimen Description   Final    BLOOD RIGHT ANTECUBITAL Performed at [facility] [address], [place], [place] [address]    Special Requests   Final    BOTTLES DRAWN AEROBIC AND ANAEROBIC Blood Culture results may not be optimal due to an excessive volume of blood received in culture bottles Performed at [facility] [address], [place], [place] [address]    Culture   Final    NO GROWTH < 12 HOURS Performed at [facility] [address] [place], [place] [address]    Report Status PENDING  Incomplete      Radiology Studies: DG Chest Port 1 View  Result Date: 05/25/2020 CLINICAL DATA:  Known COVID exposure, increasing lethargy EXAM: PORTABLE CHEST 1 VIEW COMPARISON:  None. FINDINGS: Hyperinflation of the lungs. Some coarsened interstitial features are present as well. 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 No pneumothorax or visible effusion. The aorta is calcified. The remaining cardiomediastinal contours are unremarkable. No acute osseous or soft tissue abnormality. Telemetry leads overlie the chest. IMPRESSION: Few subtle patchy opacities in the right and left infrahilar lung the left lung opacity may be accentuated by overlying breast tissue. Appearance suggesting prior right mastectomy, correlate with surgical history. Aortic Atherosclerosis (ICD10-I70.0). Electronically Signed   By: Lovena Le M.D.   On: 05/25/2020 19:28    Scheduled Meds:  dorzolamide-timolol  1 drop Left Eye BID   enoxaparin (LOVENOX) injection  40 mg Subcutaneous  Q24H   ketorolac  1 drop Right Eye BID   latanoprost  1 drop Left Eye QHS   prednisoLONE acetate  1 drop Right Eye BID   Continuous Infusions:  sodium chloride     famotidine (PEPCID) IV       LOS: 0 days   Time spent: 25 minutes.  Patrecia Pour, MD [facility] [email or web address] 05/26/2020, 4:38 PM

## 2020-05-26 NOTE — Progress Notes (Addendum)
EDP still unable to get a-hold of family members to come up with a plan to care for pt at home.  Therefore will put pt in under obs status for the moment.  Please see consult note done earlier as the patients admission H+P.

## 2020-05-27 DIAGNOSIS — Z85819 Personal history of malignant neoplasm of unspecified site of lip, oral cavity, and pharynx: Secondary | ICD-10-CM | POA: Diagnosis not present

## 2020-05-27 DIAGNOSIS — Z8349 Family history of other endocrine, nutritional and metabolic diseases: Secondary | ICD-10-CM | POA: Diagnosis not present

## 2020-05-27 DIAGNOSIS — U071 COVID-19: Secondary | ICD-10-CM | POA: Diagnosis present

## 2020-05-27 DIAGNOSIS — M810 Age-related osteoporosis without current pathological fracture: Secondary | ICD-10-CM | POA: Diagnosis present

## 2020-05-27 DIAGNOSIS — Z7983 Long term (current) use of bisphosphonates: Secondary | ICD-10-CM | POA: Diagnosis not present

## 2020-05-27 DIAGNOSIS — Z888 Allergy status to other drugs, medicaments and biological substances status: Secondary | ICD-10-CM | POA: Diagnosis not present

## 2020-05-27 DIAGNOSIS — Z973 Presence of spectacles and contact lenses: Secondary | ICD-10-CM | POA: Diagnosis not present

## 2020-05-27 DIAGNOSIS — F0390 Unspecified dementia without behavioral disturbance: Secondary | ICD-10-CM | POA: Diagnosis present

## 2020-05-27 DIAGNOSIS — Z87891 Personal history of nicotine dependence: Secondary | ICD-10-CM | POA: Diagnosis not present

## 2020-05-27 DIAGNOSIS — G3184 Mild cognitive impairment, so stated: Secondary | ICD-10-CM | POA: Diagnosis not present

## 2020-05-27 DIAGNOSIS — E039 Hypothyroidism, unspecified: Secondary | ICD-10-CM | POA: Diagnosis present

## 2020-05-27 DIAGNOSIS — Z79899 Other long term (current) drug therapy: Secondary | ICD-10-CM | POA: Diagnosis not present

## 2020-05-27 DIAGNOSIS — J449 Chronic obstructive pulmonary disease, unspecified: Secondary | ICD-10-CM | POA: Diagnosis present

## 2020-05-27 DIAGNOSIS — Z79890 Hormone replacement therapy (postmenopausal): Secondary | ICD-10-CM | POA: Diagnosis not present

## 2020-05-27 DIAGNOSIS — J1282 Pneumonia due to coronavirus disease 2019: Secondary | ICD-10-CM | POA: Diagnosis present

## 2020-05-27 DIAGNOSIS — E785 Hyperlipidemia, unspecified: Secondary | ICD-10-CM | POA: Diagnosis present

## 2020-05-27 DIAGNOSIS — I10 Essential (primary) hypertension: Secondary | ICD-10-CM | POA: Diagnosis present

## 2020-05-27 DIAGNOSIS — H4010X0 Unspecified open-angle glaucoma, stage unspecified: Secondary | ICD-10-CM | POA: Diagnosis present

## 2020-05-27 LAB — COMPREHENSIVE METABOLIC PANEL
ALT: 46 U/L — ABNORMAL HIGH (ref 0–44)
AST: 84 U/L — ABNORMAL HIGH (ref 15–41)
Albumin: 3.4 g/dL — ABNORMAL LOW (ref 3.5–5.0)
Alkaline Phosphatase: 56 U/L (ref 38–126)
Anion gap: 12 (ref 5–15)
BUN: 22 mg/dL (ref 8–23)
CO2: 22 mmol/L (ref 22–32)
Calcium: 8.6 mg/dL — ABNORMAL LOW (ref 8.9–10.3)
Chloride: 103 mmol/L (ref 98–111)
Creatinine, Ser: 0.96 mg/dL (ref 0.44–1.00)
GFR, Estimated: >60 mL/min (ref >60)
Glucose, Bld: 90 mg/dL (ref 70–99)
Potassium: 3.7 mmol/L (ref 3.5–5.1)
Sodium: 137 mmol/L (ref 135–145)
Total Bilirubin: 0.8 mg/dL (ref 0.3–1.2)
Total Protein: 6.4 g/dL — ABNORMAL LOW (ref 6.5–8.1)

## 2020-05-27 LAB — CBC WITH DIFFERENTIAL/PLATELET
Abs Immature Granulocytes: 0.01 10*3/uL (ref 0.00–0.07)
Basophils Absolute: 0 10*3/uL (ref 0.0–0.1)
Basophils Relative: 0 %
Eosinophils Absolute: 0 10*3/uL (ref 0.0–0.5)
Eosinophils Relative: 0 %
HCT: 38.2 % (ref 36.0–46.0)
Hemoglobin: 12.4 g/dL (ref 12.0–15.0)
Immature Granulocytes: 0 %
Lymphocytes Relative: 40 %
Lymphs Abs: 2.1 10*3/uL (ref 0.7–4.0)
MCH: 29.5 pg (ref 26.0–34.0)
MCHC: 32.5 g/dL (ref 30.0–36.0)
MCV: 91 fL (ref 80.0–100.0)
Monocytes Absolute: 0.7 10*3/uL (ref 0.1–1.0)
Monocytes Relative: 13 %
Neutro Abs: 2.5 10*3/uL (ref 1.7–7.7)
Neutrophils Relative %: 47 %
Platelets: 196 10*3/uL (ref 150–400)
RBC: 4.2 MIL/uL (ref 3.87–5.11)
RDW: 13.9 % (ref 11.5–15.5)
WBC: 5.3 10*3/uL (ref 4.0–10.5)
nRBC: 0 % (ref 0.0–0.2)

## 2020-05-27 LAB — D-DIMER, QUANTITATIVE: D-Dimer, Quant: 1.93 ug/mL-FEU — ABNORMAL HIGH (ref 0.00–0.50)

## 2020-05-27 MED ORDER — DORZOLAMIDE HCL-TIMOLOL MAL 2-0.5 % OP SOLN
1.0000 [drp] | Freq: Two times a day (BID) | OPHTHALMIC | Status: DC
  Administered 2020-05-27 – 2020-05-28 (×3): 1 [drp] via OPHTHALMIC
  Filled 2020-05-27: qty 10

## 2020-05-27 NOTE — Progress Notes (Signed)
PROGRESS NOTE  TERSA FOTOPOULOS  LPF:[id number] DOB: 07/18/1941 DOA: 05/25/2020 PCP: Deland Pretty, MD   Brief Narrative: [name] is a 79 y.o. female with a history of early dementia, HTN, glaucoma who presented to the ED 10/26 with cough and fever after spending some time with her daughter who was later diagnosed with covid-19. CXR demonstrated mild opacities and SARS-CoV-2 PCR was positive. CRP, WBC, PCT were negative. Lactic acid 2.3. Monoclonal antibody was given and discharge was planned. Unfortunately, the patient is not felt to be safe at home alone and she has continued to be tachypneic with altered mental status/confusion.   Assessment & Plan: Principal Problem:   COVID-19 virus infection Active Problems:   COPD (chronic obstructive pulmonary disease) (HCC)   Open-angle glaucoma   MCI (mild cognitive impairment)  Covid-19 pneumonia: SARS-CoV-2 PCR positive on 10/26. At high risk of hospitalization/death so was given monoclonal antibody 10/26. - Monitor SpO2 closely. Does not currently meet criteria for steroids - Evidence for remdesivir is lacking, and patient has no requirement for other authorized therapies.   - Continue airborne, contact precautions for 10 days from positive testing. - Enoxaparin prophylactic dose while admitted.  Early dementia/MCI: With possible acute delirium related to hospitalization and/or covid-19 infection. - Delirium precautions. Discussed with family by phone today.   Glaucoma: Stable.  - Continue gtt's - All ordered drops were recommended to be continued by the patient's ophthalmologist.   DVT prophylaxis: Lovenox Code Status: Full Family Communication: Discussed with patient's son, Elta [place], at length this morning who will keep his brother and sister updated.  Disposition Plan:  Status is: Inpatient  The patient will require care spanning > 2 midnights and should be moved to inpatient because: Unsafe d/c plan. Pt's son, Lanny Hurst, is driving from  [place] to supervise the patient's recovery and assist at home. He will be here 10/29.  Dispo: The patient is from: Home              Anticipated d/c is to: Home              Anticipated d/c date is: 1 day              Patient currently is not medically stable to d/c.  Consultants:   None  Procedures:   None  Antimicrobials:  None   Subjective: Having intermittent cough and some exertional shortness of breath. Feels very mentally foggy, having trouble remembering names and planning her return home.  Objective: Vitals:   05/26/20 2129 05/27/20 0617 05/27/20 0800 05/27/20 1313  BP: 136/70 138/80  129/79  Pulse: 75 93  98  Resp: 17 18  18   Temp: 97.9 F (36.6 C) 98.2 F (36.8 C)  98.2 F (36.8 C)  TempSrc:    Oral  SpO2: 95% 94% 95% 96%  Weight:      Height:        Intake/Output Summary (Last 24 hours) at 05/27/2020 1634 Last data filed at 05/27/2020 1334 Gross per 24 hour  Intake 590 ml  Output --  Net 590 ml   Filed Weights   05/25/20 1844  Weight: 52.6 kg   Gen: 79 y.o. female in no distress Pulm: Nonlabored but tachypneic at rest, clear bilaterally. CV: Regular rate and rhythm. No murmur, rub, or gallop. No JVD, no dependent edema. GI: Abdomen soft, non-tender, non-distended, with normoactive bowel sounds.  Ext: Warm, no deformities Skin: No rashes, lesions or ulcers on visualized skin. Neuro: Alert and oriented  to person and place and situation, follows commands and has no focal deficits. Movements are bradykinetic. Psych: Judgement and insight appear fair, though cognition is slowed. Mood euthymic & affect congruent.    Data Reviewed: I have personally reviewed following labs and imaging studies  CBC: Recent Labs  Lab 05/25/20 1953 05/27/20 0318  WBC 6.8 5.3  NEUTROABS 4.9 2.5  HGB 12.7 12.4  HCT 39.1 38.2  MCV 91.6 91.0  PLT 266 294   Basic Metabolic Panel: Recent Labs  Lab 05/25/20 1953 05/27/20 0318  NA 136 137  K 3.6 3.7  CL 102  103  CO2 23 22  GLUCOSE 101* 90  BUN 18 22  CREATININE 1.05* 0.96  CALCIUM 9.0 8.6*   GFR: Estimated Creatinine Clearance: 40.1 mL/min (by C-G formula based on SCr of 0.96 mg/dL). Liver Function Tests: Recent Labs  Lab 05/25/20 1953 05/27/20 0318  AST 64* 84*  ALT 44 46*  ALKPHOS 72 56  BILITOT 0.5 0.8  PROT 7.5 6.4*  ALBUMIN 3.9 3.4*   No results for input(s): LIPASE, AMYLASE in the last 168 hours. No results for input(s): AMMONIA in the last 168 hours. Coagulation Profile: No results for input(s): INR, PROTIME in the last 168 hours. Cardiac Enzymes: No results for input(s): CKTOTAL, CKMB, CKMBINDEX, TROPONINI in the last 168 hours. BNP (last 3 results) No results for input(s): PROBNP in the last 8760 hours. HbA1C: No results for input(s): HGBA1C in the last 72 hours. CBG: No results for input(s): GLUCAP in the last 168 hours. Lipid Profile: Recent Labs    05/25/20 1953  TRIG 53   Thyroid Function Tests: No results for input(s): TSH, T4TOTAL, FREET4, T3FREE, THYROIDAB in the last 72 hours. Anemia Panel: Recent Labs    05/25/20 1953  FERRITIN 80   Urine analysis: No results found for: COLORURINE, APPEARANCEUR, LABSPEC, PHURINE, GLUCOSEU, HGBUR, BILIRUBINUR, KETONESUR, PROTEINUR, UROBILINOGEN, NITRITE, LEUKOCYTESUR Recent Results (from the past 240 hour(s))  Respiratory Panel by RT PCR (Flu A&B, Covid) - Nasopharyngeal Swab     Status: Abnormal   Collection Time: 05/25/20  7:53 PM   Specimen: Nasopharyngeal Swab  Result Value Ref Range Status   SARS Coronavirus 2 by RT PCR POSITIVE (A) NEGATIVE Final    Comment: RESULT CALLED TO, READ BACK BY AND VERIFIED WITH: RAY D. 10.26.21 @ 7654 BY MECIAL J. (NOTE) SARS-CoV-2 target nucleic acids are DETECTED.  SARS-CoV-2 RNA is generally detectable in upper respiratory specimens  during the acute phase of infection. Positive results are indicative of the presence of the identified virus, but do not rule  out bacterial infection or co-infection with other pathogens not detected by the test. Clinical correlation with patient history and other diagnostic information is necessary to determine patient infection status. The expected result is Negative.  Fact Sheet for Patients:  PinkCheek.be  Fact Sheet for Healthcare Providers: GravelBags.it  This test is not yet approved or cleared by the [place] FDA and  has been authorized for detection and/or diagnosis of SARS-CoV-2 by FDA under an Emergency Use Authorization (EUA).  This EUA will remain in effect (meaning this test can be  used) for the duration of  the COVID-19 declaration under Section 564(b)(1) of the Act, 21 U.S.C. section 360bbb-3(b)(1), unless the authorization is terminated or revoked sooner.      Influenza A by PCR NEGATIVE NEGATIVE Final   Influenza B by PCR NEGATIVE NEGATIVE Final    Comment: (NOTE) The Xpert Xpress SARS-CoV-2/FLU/RSV assay is intended as an  aid in  the diagnosis of influenza from Nasopharyngeal swab specimens and  should not be used as a sole basis for treatment. Nasal washings and  aspirates are unacceptable for Xpert Xpress SARS-CoV-2/FLU/RSV  testing.  Fact Sheet for Patients: PinkCheek.be  Fact Sheet for Healthcare Providers: GravelBags.it  This test is not yet approved or cleared by the [place] FDA and  has been authorized for detection and/or diagnosis of SARS-CoV-2 by  FDA under an Emergency Use Authorization (EUA). This EUA will remain  in effect (meaning this test can be used) for the duration of the  Covid-19 declaration under Section 564(b)(1) of the Act, 21  U.S.C. section 360bbb-3(b)(1), unless the authorization is  terminated or revoked. Performed at [facility], [place] [address], [place], [place] [address]   Blood Culture (routine x 2)      Status: None (Preliminary result)   Collection Time: 05/25/20  7:53 PM   Specimen: BLOOD  Result Value Ref Range Status   Specimen Description   Final    BLOOD LEFT WRIST Performed at [facility] [address], [place], [place] [address]    Special Requests   Final    BOTTLES DRAWN AEROBIC AND ANAEROBIC Blood Culture adequate volume Performed at [facility] [address] [place], [place] [address]    Culture   Final    NO GROWTH 2 DAYS Performed at [facility] [address], [place], [place] [address]    Report Status PENDING  Incomplete  Blood Culture (routine x 2)     Status: None (Preliminary result)   Collection Time: 05/25/20  7:53 PM   Specimen: BLOOD  Result Value Ref Range Status   Specimen Description   Final    BLOOD RIGHT ANTECUBITAL Performed at [facility] [address], [place], [place] [address]    Special Requests   Final    BOTTLES DRAWN AEROBIC AND ANAEROBIC Blood Culture results may not be optimal due to an excessive volume of blood received in culture bottles Performed at [facility] [address], [place], [place] [address]    Culture   Final    NO GROWTH 2 DAYS Performed at [facility] [address], [place], [place] [address]    Report Status PENDING  Incomplete      Radiology Studies: DG Chest Port 1 View  Result Date: 05/25/2020 CLINICAL DATA:  Known COVID exposure, increasing lethargy EXAM: PORTABLE CHEST 1 VIEW COMPARISON:  None. FINDINGS: Hyperinflation of the lungs. Some coarsened interstitial features are present as well. 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 No pneumothorax or visible effusion. The aorta is calcified. The remaining cardiomediastinal contours are  unremarkable. No acute osseous or soft tissue abnormality. Telemetry leads overlie the chest. IMPRESSION: Few subtle patchy opacities in the right and left infrahilar lung the left lung opacity may be accentuated by overlying breast tissue. Appearance suggesting prior right mastectomy, correlate with surgical history. Aortic Atherosclerosis (ICD10-I70.0). Electronically Signed   By: Lovena Le M.D.   On: 05/25/2020 19:28    Scheduled Meds: . dorzolamide-timolol  1 drop Left Eye BID  . enoxaparin (LOVENOX) injection  40 mg Subcutaneous Q24H  . ketorolac  1 drop Right Eye BID  . latanoprost  1 drop Left Eye QHS  Continuous Infusions: . sodium chloride    . famotidine (PEPCID) IV       LOS: 0 days   Time spent: 25 minutes.  Patrecia Pour, MD [facility] [email or web address] 05/27/2020, 4:34 PM

## 2020-05-27 NOTE — Progress Notes (Signed)
This nurse spoke to [place], patient's son, this afternoon. He is on his way to pick up the patient, but his estimated time of arrival is to be determined. Son states that he is on his way but arrival could be anywhere from 10/29 in the evening to 10/30 in the evening. This nurse spoke to social worker to update regarding this.

## 2020-05-28 LAB — CBC WITH DIFFERENTIAL/PLATELET
Abs Immature Granulocytes: 0.01 10*3/uL (ref 0.00–0.07)
Basophils Absolute: 0 10*3/uL (ref 0.0–0.1)
Basophils Relative: 0 %
Eosinophils Absolute: 0 10*3/uL (ref 0.0–0.5)
Eosinophils Relative: 0 %
HCT: 37.7 % (ref 36.0–46.0)
Hemoglobin: 12.4 g/dL (ref 12.0–15.0)
Immature Granulocytes: 0 %
Lymphocytes Relative: 41 %
Lymphs Abs: 1.8 10*3/uL (ref 0.7–4.0)
MCH: 30 pg (ref 26.0–34.0)
MCHC: 32.9 g/dL (ref 30.0–36.0)
MCV: 91.1 fL (ref 80.0–100.0)
Monocytes Absolute: 0.6 10*3/uL (ref 0.1–1.0)
Monocytes Relative: 14 %
Neutro Abs: 1.9 10*3/uL (ref 1.7–7.7)
Neutrophils Relative %: 45 %
Platelets: 193 10*3/uL (ref 150–400)
RBC: 4.14 MIL/uL (ref 3.87–5.11)
RDW: 13.6 % (ref 11.5–15.5)
WBC: 4.4 10*3/uL (ref 4.0–10.5)
nRBC: 0 % (ref 0.0–0.2)

## 2020-05-28 LAB — COMPREHENSIVE METABOLIC PANEL
ALT: 38 U/L (ref 0–44)
AST: 68 U/L — ABNORMAL HIGH (ref 15–41)
Albumin: 3.1 g/dL — ABNORMAL LOW (ref 3.5–5.0)
Alkaline Phosphatase: 51 U/L (ref 38–126)
Anion gap: 9 (ref 5–15)
BUN: 18 mg/dL (ref 8–23)
CO2: 22 mmol/L (ref 22–32)
Calcium: 8.4 mg/dL — ABNORMAL LOW (ref 8.9–10.3)
Chloride: 106 mmol/L (ref 98–111)
Creatinine, Ser: 0.86 mg/dL (ref 0.44–1.00)
GFR, Estimated: >60 mL/min (ref >60)
Glucose, Bld: 72 mg/dL (ref 70–99)
Potassium: 3.6 mmol/L (ref 3.5–5.1)
Sodium: 137 mmol/L (ref 135–145)
Total Bilirubin: 0.5 mg/dL (ref 0.3–1.2)
Total Protein: 6.1 g/dL — ABNORMAL LOW (ref 6.5–8.1)

## 2020-05-28 LAB — D-DIMER, QUANTITATIVE: D-Dimer, Quant: 0.72 ug/mL-FEU — ABNORMAL HIGH (ref 0.00–0.50)

## 2020-05-28 NOTE — Discharge Summary (Addendum)
Physician Discharge Summary  ARLENNE KIMBLEY WYO:[id number] DOB: 1941-03-13 DOA: 05/25/2020  PCP: Deland Pretty, MD  Admit date: 05/25/2020 Discharge date: 05/28/2020  Admitted From: Home  Disposition:   Home   Recommendations for Outpatient Follow-up and new medication changes:  1. Follow up with Dr. Shelia Media in 2 weeks.  2. Continue self quarantine for 2 weeks, use a mask in public and maintain physical distancing. It is recommended to get COVID 19 vaccine after recovery per primary care provider.   I spoke over the phone with the patient's daughter about patient's  condition, plan of care, prognosis and all questions were addressed.  Home Health: yes   Equipment/Devices: no    Discharge Condition: stable  CODE STATUS: full  Diet recommendation:  Regular.   Brief/Interim Summary: Mrs. Mahn was admitted to the hospital with a working diagnosis of positive SARS COVID-19 infection.  79 year old female with past medical history for COPD and dementia who lives at home by herself who presented with cough and fever.  Positive sick contact at home.  A friend found her febrile and called EMS.  At the time of her initial physical examination her temperature was 100.9 F, oxygen saturation 94% on room air, blood pressure 145/83, heart rate 98, respiratory rate 29.  Her lungs are clear to auscultation bilaterally, heart S1-S2, present rhythm, soft abdomen, no lower extremity edema. Sodium 136, potassium 3.7, chloride 102, bicarb 23, glucose 101, BUN 18, creatinine 1.0, AST 64, ALT 44, white count 6.8, hemoglobin 12.7, hematocrit 39.1, platelets 266. SARS COVID-19 positive. Chest radiograph with bibasilar atelectasis. EKG 91 bpm, normal axis, normal intervals, sinus rhythm, poor R wave progression, no ST segment or T wave changes.  With clinical emergency depression, advanced age, she received monoclonal antibodies on 10/26.  She has remained hemodynamically stable, PT has recommended home health  services.  1. Acute SARS Covid 19 viral infection.  Patient remained stable, she has been afebrile, no increased oxygen requirements. Inflammatory markers trending down.  COVID-19 Labs  Recent Labs    05/25/20 1953 05/27/20 0318 05/28/20 0332  DDIMER 0.98* 1.93* 0.72*  FERRITIN 80  --   --   LDH 164  --   --   CRP 0.7  --   --     Lab Results  Component Value Date   SARSCOV2NAA POSITIVE (A) 05/25/2020   [place] Not Detected 05/20/2019   Patient will follow up within 2 weeks with Dr. Shelia Media.  2.  Mild dementia.  No confusion or agitation.  She will be discharged under the care of her family.  3.  Glaucoma.  Stable, continue her eyedrops.  4.  COPD.  No signs of acute exacerbation.  5. Dyslipidemia HTN.  Continue with lovastatin. Blood pressure control with metoprolol, valsartan and hctz.   6. Hypothyroid. Continue levothyroxine.   Discharge Diagnoses:  Principal Problem:   COVID-19 virus infection Active Problems:   COPD (chronic obstructive pulmonary disease) (HCC)   Open-angle glaucoma   MCI (mild cognitive impairment)    Discharge Instructions   Allergies as of 05/28/2020      Reactions   Brimonidine Itching   Red eyes   Naphazoline-polyethyl Glycol    THAT YOU USE TO DILATE YOUR EYES.Marland KitchenMAKES BP GO UP, CAN ONLY USE HALF STRENGTH   Tropicamide Other (See Comments)   Elevates IOP OK to use 0.5%      Medication List    TAKE these medications   Calcium Carbonate-Vitamin D 600-400 MG-UNIT tablet Take 1 tablet  by mouth daily.   CENTRUM SILVER PO Take 1 tablet by mouth daily.   Cosopt 22.3-6.8 MG/ML ophthalmic solution Generic drug: dorzolamide-timolol Place 2 drops into both eyes 2 (two) times daily.   Fish Oil 500 MG Caps Take 500 mg by mouth daily.   IMODIUM PO Take 1 tablet by mouth daily as needed (loose stool).   ketorolac 0.5 % ophthalmic solution Commonly known as: ACULAR Place 1 drop into the right eye 4 (four) times daily.    lovastatin 40 MG tablet Commonly known as: MEVACOR Take 40 mg by mouth at bedtime.   Lumigan 0.01 % Soln Generic drug: bimatoprost Place 1 drop into both eyes at bedtime.   metoprolol succinate 100 MG 24 hr tablet Commonly known as: TOPROL-XL Take 100 mg by mouth daily.   sodium chloride 5 % ophthalmic solution Commonly known as: MURO 128 Place 1 drop into both eyes every 4 (four) hours as needed for eye irritation.   Synthroid 88 MCG tablet Generic drug: levothyroxine Take 88 mcg by mouth daily.   valsartan-hydrochlorothiazide 160-12.5 MG tablet Commonly known as: DIOVAN-HCT Take 1 tablet by mouth daily.   VITAMIN D-3 PO Take 800 mg by mouth daily.       Allergies  Allergen Reactions  . Brimonidine Itching    Red eyes  . Naphazoline-Polyethyl Glycol     THAT YOU USE TO DILATE YOUR EYES.Marland KitchenMAKES BP GO UP, CAN ONLY USE HALF STRENGTH  . Tropicamide Other (See Comments)    Elevates IOP OK to use 0.5%      Procedures/Studies: DG Chest Port 1 View  Result Date: 05/25/2020 CLINICAL DATA:  Known COVID exposure, increasing lethargy EXAM: PORTABLE CHEST 1 VIEW COMPARISON:  None. FINDINGS: Hyperinflation of the lungs. Some coarsened interstitial features are present as well. 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 No pneumothorax or visible effusion. The aorta is calcified. The remaining cardiomediastinal contours are unremarkable. No acute osseous or soft tissue abnormality. Telemetry leads overlie the chest. IMPRESSION: Few subtle patchy opacities in the right and left infrahilar lung the left lung opacity may be accentuated by overlying breast tissue. Appearance suggesting prior right mastectomy, correlate with surgical history. Aortic Atherosclerosis (ICD10-I70.0). Electronically Signed   By: Lovena Le  M.D.   On: 05/25/2020 19:28       Subjective: Patient is feeling better, no nausea or vomiting, no dyspnea or chest pain.   Discharge Exam: Vitals:   05/27/20 1943 05/28/20 0416  BP: 131/80 (!) 149/75  Pulse: 98 71  Resp: 17 17  Temp: 97.8 F (36.6 C) 98.2 F (36.8 C)  SpO2: 95% 95%   Vitals:   05/27/20 0800 05/27/20 1313 05/27/20 1943 05/28/20 0416  BP:  129/79 131/80 (!) 149/75  Pulse:  98 98 71  Resp:  18 17 17   Temp:  98.2 F (36.8 C) 97.8 F (36.6 C) 98.2 F (36.8 C)  TempSrc:  Oral Oral Oral  SpO2: 95% 96% 95% 95%  Weight:      Height:        General: Not in pain or dyspnea.  Neurology: Awake and alert, non focal  E ENT: mild pallor, no icterus, oral mucosa moist Cardiovascular: No JVD. S1-S2 present, rhythmic, no gallops, rubs, or murmurs. No lower extremity edema. Pulmonary: positive breath sounds bilaterally, with no wheezing, rhonchi or rales. Gastrointestinal.  Abdomen soft and non tender.  Skin. No rashes Musculoskeletal: no joint deformities   The results of significant diagnostics from this hospitalization (including imaging, microbiology, ancillary and laboratory) are listed below for reference.     Microbiology: Recent Results (from the past 240 hour(s))  Respiratory Panel by RT PCR (Flu A&B, Covid) - Nasopharyngeal Swab     Status: Abnormal   Collection Time: 05/25/20  7:53 PM   Specimen: Nasopharyngeal Swab  Result Value Ref Range Status   SARS Coronavirus 2 by RT PCR POSITIVE (A) NEGATIVE Final    Comment: RESULT CALLED TO, READ BACK BY AND VERIFIED WITH: RAY D. 10.26.21 @ 2246 BY MECIAL J. (NOTE) SARS-CoV-2 target nucleic acids are DETECTED.  SARS-CoV-2 RNA is generally detectable in upper respiratory specimens  during the acute phase of infection. Positive results are indicative of the presence of the identified virus, but do not rule out bacterial infection or co-infection with other pathogens not detected by the test. Clinical  correlation with patient history and other diagnostic information is necessary to determine patient infection status. The expected result is Negative.  Fact Sheet for Patients:  PinkCheek.be  Fact Sheet for Healthcare Providers: GravelBags.it  This test is not yet approved or cleared by the [place] FDA and  has been authorized for detection and/or diagnosis of SARS-CoV-2 by FDA under an Emergency Use Authorization (EUA).  This EUA will remain in effect (meaning this test can be  used) for the duration of  the COVID-19 declaration under Section 564(b)(1) of the Act, 21 U.S.C. section 360bbb-3(b)(1), unless the authorization is terminated or revoked sooner.      Influenza A by PCR NEGATIVE NEGATIVE Final   Influenza B by PCR NEGATIVE NEGATIVE Final    Comment: (NOTE) The Xpert Xpress SARS-CoV-2/FLU/RSV assay is intended as an aid in  the diagnosis of influenza from Nasopharyngeal swab specimens and  should not be used as a sole basis for treatment. Nasal washings and  aspirates are unacceptable for Xpert Xpress SARS-CoV-2/FLU/RSV  testing.  Fact Sheet for Patients: PinkCheek.be  Fact Sheet for Healthcare Providers: GravelBags.it  This test is not yet approved or cleared by the [place] FDA and  has been authorized for detection and/or diagnosis of SARS-CoV-2 by  FDA under an Emergency Use Authorization (EUA). This EUA will remain  in effect (meaning this test can be used) for the duration of the  Covid-19 declaration under Section 564(b)(1) of the Act, 21  U.S.C. section 360bbb-3(b)(1), unless the authorization is  terminated or revoked. Performed at [facility], [place] [address], [place], [place] [address]   Blood Culture (routine x 2)     Status: None (Preliminary result)   Collection Time: 05/25/20  7:53 PM   Specimen: BLOOD   Result Value Ref Range Status   Specimen Description   Final    BLOOD LEFT WRIST Performed at [facility] [address], [place], [place] [address]    Special Requests   Final    BOTTLES DRAWN AEROBIC AND ANAEROBIC Blood Culture adequate volume Performed at [facility] [address], [place], [place] [address]    Culture   Final    NO GROWTH 2 DAYS Performed at [facility] [address] [place], [place] [address]    Report Status PENDING  Incomplete  Blood Culture (routine x 2)     Status: None (Preliminary result)   Collection Time: 05/25/20  7:53 PM  Specimen: BLOOD  Result Value Ref Range Status   Specimen Description   Final    BLOOD RIGHT ANTECUBITAL Performed at [facility] [address] [place], [place] [address]    Special Requests   Final    BOTTLES DRAWN AEROBIC AND ANAEROBIC Blood Culture results may not be optimal due to an excessive volume of blood received in culture bottles Performed at [facility] [address] [place], [place] [address]    Culture   Final    NO GROWTH 2 DAYS Performed at [facility] [address], [place], [place] [address]    Report Status PENDING  Incomplete     Labs: BNP (last 3 results) No results for input(s): BNP in the last 8760 hours. Basic Metabolic Panel: Recent Labs  Lab 05/25/20 1953 05/27/20 0318 05/28/20 0332  NA 136 137 137  K 3.6 3.7 3.6  CL 102 103 106  CO2 23 22 22   GLUCOSE 101* 90 72  BUN 18 22 18   CREATININE 1.05* 0.96 0.86  CALCIUM 9.0 8.6* 8.4*   Liver Function Tests: Recent Labs  Lab 05/25/20 1953 05/27/20 0318 05/28/20 0332  AST 64* 84* 68*  ALT 44 46* 38  ALKPHOS 72 56 51  BILITOT 0.5 0.8 0.5  PROT 7.5 6.4* 6.1*  ALBUMIN 3.9 3.4* 3.1*   No results for input(s): LIPASE, AMYLASE in the last 168 hours. No results for input(s): AMMONIA in the last 168 hours. CBC: Recent Labs  Lab  05/25/20 1953 05/27/20 0318 05/28/20 0332  WBC 6.8 5.3 4.4  NEUTROABS 4.9 2.5 1.9  HGB 12.7 12.4 12.4  HCT 39.1 38.2 37.7  MCV 91.6 91.0 91.1  PLT 266 196 193   Cardiac Enzymes: No results for input(s): CKTOTAL, CKMB, CKMBINDEX, TROPONINI in the last 168 hours. BNP: Invalid input(s): POCBNP CBG: No results for input(s): GLUCAP in the last 168 hours. D-Dimer Recent Labs    05/27/20 0318 05/28/20 0332  DDIMER 1.93* 0.72*   Hgb A1c No results for input(s): HGBA1C in the last 72 hours. Lipid Profile Recent Labs    05/25/20 1953  TRIG 53   Thyroid function studies No results for input(s): TSH, T4TOTAL, T3FREE, THYROIDAB in the last 72 hours.  Invalid input(s): FREET3 Anemia work up [employer]    05/25/20 [place] 80   Urinalysis No results found for: COLORURINE, APPEARANCEUR, [place], [place], GLUCOSEU, [place], [place], [place], PROTEINUR, UROBILINOGEN, NITRITE, LEUKOCYTESUR Sepsis Labs Invalid input(s): PROCALCITONIN,  WBC,  LACTICIDVEN Microbiology Recent Results (from the past 240 hour(s))  Respiratory Panel by RT PCR (Flu A&B, Covid) - Nasopharyngeal Swab     Status: Abnormal   Collection Time: 05/25/20  7:53 PM   Specimen: Nasopharyngeal Swab  Result Value Ref Range Status   SARS Coronavirus 2 by RT PCR POSITIVE (A) NEGATIVE Final    Comment: RESULT CALLED TO, READ BACK BY AND VERIFIED WITH: RAY D. 10.26.21 @ 9147 BY MECIAL J. (NOTE) SARS-CoV-2 target nucleic acids are DETECTED.  SARS-CoV-2 RNA is generally detectable in upper respiratory specimens  during the acute phase of infection. Positive results are indicative of the presence of the identified virus, but do not rule out bacterial infection or co-infection with other pathogens not detected by the test. Clinical correlation with patient history and other diagnostic information is necessary to determine patient infection status. The expected result is Negative.  Fact Sheet for  Patients:  PinkCheek.be  Fact Sheet for Healthcare Providers: GravelBags.it  This test is not yet approved or cleared by the [place] and  has been authorized for detection and/or diagnosis of SARS-CoV-2 by FDA under an Emergency Use Authorization (EUA).  This EUA will remain in effect (meaning this test can be  used) for the duration of  the COVID-19 declaration under Section 564(b)(1) of the Act, 21 U.S.C. section 360bbb-3(b)(1), unless the authorization is terminated or revoked sooner.      Influenza A by PCR NEGATIVE NEGATIVE Final   Influenza B by PCR NEGATIVE NEGATIVE Final    Comment: (NOTE) The Xpert Xpress SARS-CoV-2/FLU/RSV assay is intended as an aid in  the diagnosis of influenza from Nasopharyngeal swab specimens and  should not be used as a sole basis for treatment. Nasal washings and  aspirates are unacceptable for Xpert Xpress SARS-CoV-2/FLU/RSV  testing.  Fact Sheet for Patients: PinkCheek.be  Fact Sheet for Healthcare Providers: GravelBags.it  This test is not yet approved or cleared by the [place] FDA and  has been authorized for detection and/or diagnosis of SARS-CoV-2 by  FDA under an Emergency Use Authorization (EUA). This EUA will remain  in effect (meaning this test can be used) for the duration of the  Covid-19 declaration under Section 564(b)(1) of the Act, 21  U.S.C. section 360bbb-3(b)(1), unless the authorization is  terminated or revoked. Performed at [facility], [place] [address] [place], [place] [address]   Blood Culture (routine x 2)     Status: None (Preliminary result)   Collection Time: 05/25/20  7:53 PM   Specimen: BLOOD  Result Value Ref Range Status   Specimen Description   Final    BLOOD LEFT WRIST Performed at [facility] [address], [place], [place]  [address]    Special Requests   Final    BOTTLES DRAWN AEROBIC AND ANAEROBIC Blood Culture adequate volume Performed at [facility] [address], [place], [place] [address]    Culture   Final    NO GROWTH 2 DAYS Performed at [facility] [address], [place], [place] [address]    Report Status PENDING  Incomplete  Blood Culture (routine x 2)     Status: None (Preliminary result)   Collection Time: 05/25/20  7:53 PM   Specimen: BLOOD  Result Value Ref Range Status   Specimen Description   Final    BLOOD RIGHT ANTECUBITAL Performed at [facility] [address], [place], [place] [address]    Special Requests   Final    BOTTLES DRAWN AEROBIC AND ANAEROBIC Blood Culture results may not be optimal due to an excessive volume of blood received in culture bottles Performed at [facility] [address], [place], [place] [address]    Culture   Final    NO GROWTH 2 DAYS Performed at [facility] [address], [place], [place] [address]    Report Status PENDING  Incomplete     Time coordinating discharge: 45 minutes  SIGNED:   Tawni Millers, MD  [facility] 05/28/2020, 12:46 PM

## 2020-05-28 NOTE — TOC Transition Note (Signed)
Transition of Care [facility]) - CM/SW Discharge Note   Patient Details  Name: [name] MRN: [id number] Date of Birth: Jun 30, 1941  [facility] CM/SW Contact:  Trish Mage, LCSW Phone Number: 05/28/2020, 3:11 PM   Clinical Narrative:   Patient who is scheduled for discharge today is in need of [place] with [facility] is willing to provide services. No further needs identified.  TOC sign off.    Final next level of care: [place] Barriers to Discharge: No Barriers Identified   Patient Goals and CMS Choice        Discharge Placement                       Discharge Plan and Services                                     Social Determinants of Health (SDOH) Interventions     Readmission Risk Interventions No flowsheet data found.

## 2020-05-28 NOTE — Evaluation (Signed)
Physical Therapy Evaluation Patient Details Name: [name] MRN: [id number] DOB: [birth year] Today's Date: 05/28/2020   History of Present Illness  [name] is a 79 y.o. female with a history of early dementia, HTN, glaucoma who presented to the ED 10/26 with cough and fever after spending some time with her daughter who was later diagnosed with covid-19. CXR demonstrated mild opacities and SARS-CoV-2 PCR was positive. The patient is not felt to be safe at home alone and she has continued to be tachypneic with altered mental status/confusion.  Clinical Impression  The patient presents with decreased strength and balance during ambulation, relied on at least 1 UE support of "Cruising " on objects or hand hold  From PT. Patient reports  That daughter will be staying with her(also + covid). Patient does demonstrate decreased memory and problem solving so 24/7 is recommended. Also rec. HHPT. Pt admitted with above diagnosis.   Pt currently with functional limitations due to the deficits listed below (see PT Problem List). Pt will benefit from skilled PT to increase their independence and safety with mobility to allow discharge to the venue listed below.    SPO2 >93% on Ra for ambulation x 100'.    Follow Up Recommendations Home health PT;Supervision/Assistance - 24 hour    Equipment Recommendations  None recommended by PT    Recommendations for Other Services       Precautions / Restrictions Precautions Precautions: Fall Restrictions Weight Bearing Restrictions: No      Mobility  Bed Mobility Overal bed mobility: Modified Independent                  Transfers Overall transfer level: Needs assistance Equipment used: 1 person hand held assist Transfers: Sit to/from Stand Sit to Stand: Min assist         General transfer comment: first standing up was very unsteady, ,steady assistance required to stabilize from bed, used rail from  toilet  Ambulation/Gait Ambulation/Gait assistance: Min assist Gait Distance (Feet): 100 Feet (around the room) Assistive device: 1 person hand held assist Gait Pattern/deviations: Step-through pattern;Staggering right;Staggering left Gait velocity: decr   General Gait Details: steady assistance to stabilize. patient reaching for objects to stabilize or HHA of therapist, patient required some UE support for balance  Stairs            Wheelchair Mobility    Modified Rankin (Stroke Patients Only)       Balance Overall balance assessment: Needs assistance Sitting-balance support: No upper extremity supported;Feet supported Sitting balance-Leahy Scale: Good     Standing balance support: During functional activity;No upper extremity supported Standing balance-Leahy Scale: Poor Standing balance comment: reliant on 1 UE support to ambulate                             Pertinent Vitals/Pain Pain Assessment: No/denies pain    Home Living Family/patient expects to be discharged to:: Private residence Living Arrangements: Alone Available Help at Discharge: Family;Available 24 hours/day Type of Home: House       Home Layout: One level Home Equipment: None      Prior Function Level of Independence: Independent         Comments: was still driving     Hand Dominance   Dominant Hand: Right    Extremity/Trunk Assessment   Upper Extremity Assessment Upper Extremity Assessment: Generalized weakness    Lower Extremity Assessment Lower Extremity Assessment: Generalized weakness  Cervical / Trunk Assessment Cervical / Trunk Assessment: Normal  Communication   Communication: No difficulties  Cognition Arousal/Alertness: Awake/alert Behavior During Therapy: WFL for tasks assessed/performed Overall Cognitive Status: Impaired/Different from baseline Area of Impairment: Orientation                 Orientation Level: Time              General Comments: at times slow to process and decreased problem solve      General Comments      Exercises     Assessment/Plan    PT Assessment Patient needs continued PT services  PT Problem List Decreased strength;Decreased cognition;Decreased activity tolerance;Decreased safety awareness;Decreased balance;Decreased knowledge of precautions;Decreased mobility       PT Treatment Interventions Gait training;Functional mobility training;Therapeutic activities;Therapeutic exercise;Patient/family education    PT Goals (Current goals can be found in the Care Plan section)  Acute Rehab PT Goals Patient Stated Goal: to go home PT Goal Formulation: With patient Time For Goal Achievement: 06/11/20 Potential to Achieve Goals: Good    Frequency Min 3X/week   Barriers to discharge        Co-evaluation               AM-PAC PT "6 Clicks" Mobility  Outcome Measure Help needed turning from your back to your side while in a flat bed without using bedrails?: None Help needed moving from lying on your back to sitting on the side of a flat bed without using bedrails?: None Help needed moving to and from a bed to a chair (including a wheelchair)?: A Little Help needed standing up from a chair using your arms (e.g., wheelchair or bedside chair)?: A Little Help needed to walk in hospital room?: A Little Help needed climbing 3-5 steps with a railing? : A Lot 6 Click Score: 19    End of Session   Activity Tolerance: Patient tolerated treatment well Patient left: in chair;with call bell/phone within reach;with chair alarm set Nurse Communication: Mobility status PT Visit Diagnosis: Unsteadiness on feet (R26.81)    Time: 1130-1210 PT Time Calculation (min) (ACUTE ONLY): 40 min   Charges:   PT Evaluation $PT Eval Low Complexity: 1 Low PT Treatments $Gait Training: 8-22 mins $Self Care/Home Management: [place] Pager  [phone] Office [phone]   Claretha Cooper 05/28/2020, 12:44 PM

## 2020-05-30 LAB — CULTURE, BLOOD (ROUTINE X 2)
Culture: NO GROWTH
Culture: NO GROWTH
Special Requests: ADEQUATE

## 2021-02-11 ENCOUNTER — Other Ambulatory Visit: Payer: Self-pay

## 2021-02-11 ENCOUNTER — Encounter (HOSPITAL_BASED_OUTPATIENT_CLINIC_OR_DEPARTMENT_OTHER): Payer: Self-pay | Admitting: Emergency Medicine

## 2021-02-11 DIAGNOSIS — J449 Chronic obstructive pulmonary disease, unspecified: Secondary | ICD-10-CM | POA: Insufficient documentation

## 2021-02-11 DIAGNOSIS — Z87891 Personal history of nicotine dependence: Secondary | ICD-10-CM | POA: Insufficient documentation

## 2021-02-11 DIAGNOSIS — H5789 Other specified disorders of eye and adnexa: Secondary | ICD-10-CM | POA: Insufficient documentation

## 2021-02-11 DIAGNOSIS — I10 Essential (primary) hypertension: Secondary | ICD-10-CM | POA: Insufficient documentation

## 2021-02-11 DIAGNOSIS — Z8616 Personal history of COVID-19: Secondary | ICD-10-CM | POA: Insufficient documentation

## 2021-02-11 DIAGNOSIS — Z79899 Other long term (current) drug therapy: Secondary | ICD-10-CM | POA: Diagnosis not present

## 2021-02-11 NOTE — ED Triage Notes (Signed)
Pt via pov from home with swollen and draining right eye. Pt was seen yesterday by her eye doctor and had some sort of treatment, and was fine when she left there. She began to have increased swelling and drainage during the night. Pt has glaucoma; a&o x 4; nad noted.

## 2021-02-12 ENCOUNTER — Emergency Department (HOSPITAL_BASED_OUTPATIENT_CLINIC_OR_DEPARTMENT_OTHER)
Admission: EM | Admit: 2021-02-12 | Discharge: 2021-02-12 | Disposition: A | Discharge status: Home / Self Care | Payer: Medicare HMO | Attending: Emergency Medicine | Admitting: Emergency Medicine

## 2021-02-12 DIAGNOSIS — H1031 Unspecified acute conjunctivitis, right eye: Secondary | ICD-10-CM

## 2021-02-12 DIAGNOSIS — S0501XA Injury of conjunctiva and corneal abrasion without foreign body, right eye, initial encounter: Secondary | ICD-10-CM

## 2021-02-12 MED ORDER — ERYTHROMYCIN 5 MG/GM OP OINT
TOPICAL_OINTMENT | Freq: Once | OPHTHALMIC | Status: AC
  Administered 2021-02-12: 1 via OPHTHALMIC
  Filled 2021-02-12: qty 3.5

## 2021-02-12 NOTE — Discharge Instructions (Addendum)
Follow-up tomorrow with Dr. Katy Fitch.  His cell number is [phone].  Call this number at 8 AM to make arrangements for this.

## 2021-02-12 NOTE — Consult Note (Addendum)
Ophthalmology Initial Consult Note  Kenise, Barraco, 80 y.o. female Date of Service:  02/12/2021, 1130AM   Requesting physician: No att. providers found  Information Obtained from: Patient, daughter, EMR Chief Complaint:  Redness, lid tenderness OD  HPI/Discussion:  JAMONICA SCHOFF is a 80 y.o. female with complex ocular history including CE/IOL OU, tube shunt with obturator OD, CME OD, band keratopathy OD, possible PBK OD who presents with new onset redness and tenderness on the right 2 days after seeing Dr. Gerarda Fraction. Her daughter notes worsening redness of the conjunctiva OD and mild redness around the lids. She reports no new changes in her vision. She was seen overnight by an outlying ER and started on erythromycin ophthalmic ointment. She reports no changes (neither better nor worse) since being seen late last night.  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   Cataract    Glaucoma    Hypertension    Osteoporosis    Thyroid disease    Wears glasses    Past Surgical History:  Procedure Laterality Date   CESAREAN SECTION  1978   TWINS, ONE WAS STILLBORN   MOUTH SURGERY  1996   NODULE REMOVED     BENIGN, LOWER NECK    Prior to Admission Meds: (Not in a hospital admission)   Inpatient Meds: Not applicable  Allergies  Allergen Reactions   Brimonidine Itching    Red eyes   Naphazoline-Polyethyl Glycol     THAT YOU USE TO DILATE YOUR EYES.Marland KitchenMAKES BP GO UP, CAN ONLY USE HALF STRENGTH   Tropicamide Other (See Comments)    Elevates IOP OK to use 0.5%   Social History   Tobacco Use   Smoking status: Former    Packs/day: 0.20    Years: 2.00    Pack years: 0.40    Types: Cigarettes    Quit date: 07/31/1994    Years since quitting: 26.5   Smokeless tobacco: Never   Tobacco comments:    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   Substance Use Topics   Alcohol use: No   Family History  Problem Relation Age of Onset   Thyroid disease Mother    Thyroid disease Sister     ROS: Other than ROS in the HPI, all other systems were negative.  Exam: Temp: 98.3 F (36.8 C) Pulse Rate: 72 BP: (!) 146/82 Resp: 20 SpO2: 96 %  Visual Acuity:  [place]   OD HM   OS 20/25      OD OS  Confr Vis Fields Constricted/depressed Full  EOM (Primary) Full Full  Lids/Lashes Mild erythema, allergic shiner, no edema, no induration Mild erythema, allergic shiner  Conjunctiva - Bulbar Catherine White, quiet  Conjunctiva - Palpebral               SCH, obturator temporally White, quiet  Adnexa  Normal Normal  Pupils Grossly round, but obscured inferiorly, +rAPD (by reverse) Round, reactive, no rAPD  Cornea  75% central abrasion, focal thinning about 25% remaining temporally (delle?), band keratopathy, stomal haze, no suppurative material or obvious infiltrate; IK touch inferiorly Clear  Anterior Chamber Formed, grossly quiet (view best superiorly), tube patent superiorly Deep, quiet  Lens:  IOL IOL  IOP 10 (TP) 14 (TP)  Fundus - Dilated? NO    Neuro:  Oriented to person, place, and  time:  Yes Psychiatric:  Mood and Affect Appropriate:  Yes  Labs/imaging:   A/P:  80 y.o. female with complex ocular history including:  1) Corneal abrasion OD - Continue erythromycin ointment TID.  2) Dellen OD, temporally - Appears to have good lid closure, but could have trace lagophthalmos when sleeping - Erythromycin as above.  3) Band keratopathy OD - It is difficult to determine what is acute vs what is chronic. The abrasion is obviously acute. - Defer to [facility] - saw Dr. Susa Simmonds at some point.  4) Pseudophakia OU 5) CME OD - per Dr. Cordelia Pen  Complicated ocular history. Had tried [place] at some point, but she felt it did not help. I do not think she is melting. Rather, I think the focal thinning temporally is a dellen, and the only new  condition is the large abrasion. However, it is very difficult to determine what course she will take. Discussing with on-call resident(s) at [facility]. Will continue erythromycin ointment TID and follow up with them either on Monday OR via ER triage - to be determined. I can be reached by cell at [phone].  *Seen at my office.  R Wyatt Portela, MD       R Wyatt Portela, MD 02/12/2021, 12:15 PM

## 2021-02-12 NOTE — ED Provider Notes (Signed)
[facility] DEPT Provider Note   CSN: [id number] Arrival date & time: 02/11/21  1859     History Chief Complaint  Patient presents with   Eye Drainage    [name] is a 80 y.o. female.  Patient is a 80 year old female with past medical history of open-angle glaucoma, macular edema.  Patient presenting today for evaluation of eye redness, swelling, and drainage.  This began during the night.  Patient was seen yesterday by ophthalmology and had a normal eye exam.  Since then, she has had increased swelling, drainage, redness, and scratchiness to the eye.  Her vision is blurry and the lower half of her cornea appears gray.  The history is provided by the patient.      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     Family History  Problem Relation Age of Onset   Thyroid disease Mother    Thyroid disease Sister     Social History   Tobacco Use   Smoking status: Former    Packs/day: 0.20    Years: 2.00    Pack years: 0.40    Types: Cigarettes    Quit date: 07/31/1994    Years since quitting: 26.5   Smokeless tobacco: Never   Tobacco comments:    pt quit due to oral  cancer  Vaping Use   Vaping Use: Never used  Substance Use Topics   Alcohol use: No   Drug use: No    Home Medications Prior to Admission medications   Medication Sig Start Date End Date Taking? Authorizing Provider  Calcium Carbonate-Vitamin D 600-400 MG-UNIT tablet Take 1 tablet by mouth daily.    [provider]  Cholecalciferol (VITAMIN D-3 PO) Take 800 mg by mouth daily.     [provider]  dorzolamide-timolol (COSOPT) 22.3-6.8 MG/ML ophthalmic solution Place 2 drops into both eyes 2 (two) times daily.     [provider]  ketorolac (ACULAR) 0.5 % ophthalmic solution Place 1 drop into the right eye 4 (four) times daily.  04/28/20   [provider]  levothyroxine (SYNTHROID) 88 MCG tablet Take 88 mcg by mouth daily.    [provider]  Loperamide HCl (IMODIUM PO) Take 1 tablet by mouth daily as needed (loose stool).    [provider]  lovastatin (MEVACOR) 40 MG tablet Take 40 mg by mouth at bedtime.    [provider]  LUMIGAN 0.01 % SOLN Place 1 drop into both eyes at bedtime.  04/13/20   [provider]  metoprolol succinate (TOPROL-XL) 100 MG 24 hr tablet Take  100 mg by mouth daily. 03/20/20   [provider]  Multiple Vitamins-Minerals (CENTRUM SILVER PO) Take 1 tablet by mouth daily.     [provider]  Omega-3 Fatty Acids (FISH OIL) 500 MG CAPS Take 500 mg by mouth daily.    [provider]  sodium chloride (MURO 128) 5 % ophthalmic solution Place 1 drop into both eyes every 4 (four) hours as needed for eye irritation.    [provider]  valsartan-hydrochlorothiazide (DIOVAN-HCT) 160-12.5 MG tablet Take 1 tablet by mouth daily. 03/22/20   [provider]    Allergies    Brimonidine, Naphazoline-polyethyl glycol, and Tropicamide  Review of Systems   Review of Systems  All other systems reviewed and are negative.  Physical Exam Updated Vital Signs BP (!)  146/82 (BP Location: Right Arm)   Pulse 72   Temp 98.3 F (36.8 C) (Oral)   Resp 20   Ht 5\' 6"  (1.676 m)   Wt 52.6 kg   SpO2 96%   BMI 18.72 kg/m   Physical Exam Vitals and nursing note reviewed.  Constitutional:      General: She is not in acute distress.    Appearance: Normal appearance. She is not ill-appearing, toxic-appearing or diaphoretic.  HENT:     Head: Normocephalic and atraumatic.  Eyes:     Comments: The right conjunctiva is injected with yellowish drainage matted in the eyelashes.  The cornea itself appears hazy with the lower half having a grayish appearance.  See photo below.  Pulmonary:     Effort: Pulmonary effort is normal.  Skin:    General: Skin is warm and dry.  Neurological:     Mental Status: She is alert.        ED Results / Procedures / Treatments   Labs (all labs ordered are listed, but only abnormal results are displayed) Labs Reviewed - No data to display  EKG None  Radiology No results found.  Procedures Procedures   Medications Ordered in ED Medications - No data to display  ED Course  I have reviewed the triage vital signs and the nursing notes.  Pertinent labs & imaging results that were available during my care of the patient were reviewed by me and considered in my medical decision making (see chart for details).    MDM Rules/Calculators/A&P  Patient is a 80 year old female presenting with eye complaints as described in the HPI.  Patient has an injected cornea with yellowish discharge.  She also has a hazy appearance to the cornea.  This was photographed and care was discussed with Dr. Katy Fitch from ophthalmology.  He is recommending erythromycin ointment and will see the patient in the morning.  They will call to make these arrangements.  Final Clinical Impression(s) / ED Diagnoses Final diagnoses:  None    Rx / DC Orders ED Discharge Orders     None        Veryl Speak, MD 02/12/21 [phone]

## 2021-12-28 ENCOUNTER — Encounter: Payer: Self-pay | Admitting: *Deleted

## 2022-01-03 ENCOUNTER — Ambulatory Visit (INDEPENDENT_AMBULATORY_CARE_PROVIDER_SITE_OTHER): Payer: Medicare HMO | Admitting: Psychiatry

## 2022-01-03 ENCOUNTER — Encounter: Payer: Self-pay | Admitting: Psychiatry

## 2022-01-03 VITALS — BP 130/63 | HR 63 | Ht 67.0 in | Wt 125.0 lb

## 2022-01-03 DIAGNOSIS — G3184 Mild cognitive impairment, so stated: Secondary | ICD-10-CM

## 2022-01-03 DIAGNOSIS — R413 Other amnesia: Secondary | ICD-10-CM

## 2022-01-03 NOTE — Patient Instructions (Addendum)
MRI  A person with mild cognitive impairment (MCI) experiences memory problems greater than normally expected with aging, but not serious enough to interfere with daily activities.  The patient with MCI complains of difficulty with memory. Typically, the complaints include trouble remembering the names of people they met recently, trouble remembering the flow of a conversation, and an increased tendency to misplace things, or similar problems. In many cases, the individual will be aware of these difficulties and will compensate with increased reliance on notes and calendars.   Although there is an increased chances of going on to develop dementia, it is not possible currently to predict with certainty which patients with MCI will or will not go on to develop dementia. There is currently no specific treatment for MCI. People leading sedentary lifestyles are at greater risk for developing dementia. Increased physical activity and brain exercise can help with maintaining brain function.  Tasks to improve attention/working memory 1. Good sleep hygiene (7-8 hrs of sleep) 2. Learning a new skill (Painting, Carpentry, Pottery, new language, Knitting). 3.Cognitive exercises (keep a daily journal, Puzzles) 4. Physical exercise and training  (30 min/day X 4 days week) 5. Being on Antidepressant if needed 6.Yoga, Meditation, Tai Chi 7. Decrease alcohol intake 8.Have a clear schedule and structure in daily routine

## 2022-01-03 NOTE — Progress Notes (Signed)
[facility]  PATIENT: [name] DOB: [birth year]  REFERRING CLINICIAN: Deland Pretty, MD HISTORY FROM: self, daughter REASON FOR VISIT: memory loss   HISTORICAL  CHIEF COMPLAINT:  Chief Complaint  Patient presents with   New Patient (Initial Visit)    Rm 1 with daughter here for consult on worsening memory. Has tried namenda in the past but did not tolerate well.     HISTORY OF PRESENT ILLNESS:  The patient presents for evaluation of memory loss which has been present over the past year. This worsened after she had COVID in October 2022. She will forget conversations she had 20-30 minutes prior and repeat questions frequently. Will think she has been places before when she has never actually been there. Lives with her daughter and is independent with her ADLs, though there have been some recent concerns about whether she is paying her bills.  She tried Memantine in December 2022, but this made her feel "off" so she stopped taking it.  TBI:  No past history of TBI Stroke:  no past history of stroke Seizures:  no past history of seizures Sleep: Sleeps well at night Mood: She feels her mood is relatively well controlled. Sometimes will feel down  Functional status:  Patient lives with her daughter Cooking: makes her own breakfast Cleaning: does her own laundry Shopping: Does her own grocery shopping Driving: Drives to the grocery store and [facility] which are both close by Bills: Has had some issues with paying bills recently. She has been keep junk mail and writing notes on it, which family has had to force her to throw away Medications: manages her own medications, uses a pill box Ever left the stove on by accident?: no Getting lost going to familiar places?: no Forgetting loved ones names?: no Word finding difficulty? no  OTHER MEDICAL CONDITIONS: HTN, COPD, hypothyroidism   REVIEW OF SYSTEMS: Full 14 system review of systems performed and negative  with exception of: memory loss  ALLERGIES: Allergies  Allergen Reactions   Brimonidine Itching    Red eyes   Naphazoline-Polyethyl Glycol     THAT YOU USE TO DILATE YOUR EYES.Marland KitchenMAKES BP GO UP, CAN ONLY USE HALF STRENGTH   Tropicamide Other (See Comments)    Elevates IOP OK to use 0.5%    HOME MEDICATIONS: Outpatient Medications Prior to Visit  Medication Sig Dispense Refill   Calcium Carbonate-Vitamin D 600-400 MG-UNIT tablet Take 1 tablet by mouth daily.     Cholecalciferol (VITAMIN D-3 PO) Take 800 mg by mouth daily.      dorzolamide-timolol (COSOPT) 22.3-6.8 MG/ML ophthalmic solution Place 2 drops into both eyes 2 (two) times daily.      ketorolac (ACULAR) 0.5 % ophthalmic solution Place 1 drop into the right eye 4 (four) times daily.      levothyroxine (SYNTHROID) 88 MCG tablet Take 88 mcg by mouth daily.     lovastatin (MEVACOR) 40 MG tablet Take 40 mg by mouth at bedtime.     LUMIGAN 0.01 % SOLN Place 1 drop into both eyes at bedtime.      metoprolol succinate (TOPROL-XL) 100 MG 24 hr tablet Take 100 mg by mouth daily.     Multiple Vitamins-Minerals (CENTRUM SILVER PO) Take 1 tablet by mouth daily.      Omega-3 Fatty Acids (FISH OIL) 500 MG CAPS Take 500 mg by mouth daily.     valsartan-hydrochlorothiazide (DIOVAN-HCT) 160-12.5 MG tablet Take 1 tablet by mouth daily.     Loperamide  HCl (IMODIUM PO) Take 1 tablet by mouth daily as needed (loose stool).     sodium chloride (MURO 128) 5 % ophthalmic solution Place 1 drop into both eyes every 4 (four) hours as needed for eye irritation.     No facility-administered medications prior to visit.    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   Cataract    COPD (chronic obstructive pulmonary disease) ([facility])    Glaucoma    Goiter    Hypertension    Hypothyroid    Memory loss    Osteoporosis    Psoriasis    Scoliosis    Thyroid disease    Wears glasses     PAST  SURGICAL HISTORY: Past Surgical History:  Procedure Laterality Date   CESAREAN SECTION  07/31/1976   TWINS, ONE WAS STILLBORN   MOUTH SURGERY  07/31/1994   tongue resection   NODULE REMOVED     BENIGN, LOWER NECK    FAMILY HISTORY: Family History  Problem Relation Age of Onset   Thyroid disease Mother    Thyroid disease Sister     SOCIAL HISTORY: Social History   Socioeconomic History   Marital status: Widowed    Spouse name: Not on file   Number of children: 3   Years of education: Not on file   Highest education level: Not on file  Occupational History   Not on file  Tobacco Use   Smoking status: Former    Packs/day: 0.20    Years: 2.00    Pack years: 0.40    Types: Cigarettes    Quit date: 07/31/1994    Years since quitting: 27.4   Smokeless tobacco: Never   Tobacco comments:    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  Vaping Use   Vaping Use: Never used  Substance and Sexual Activity   Alcohol use: No   Drug use: No   Sexual activity: Not on file  Other Topics Concern   Not on file  Social History Narrative   12/28/21 lives alone   Social Determinants of Health   Financial Resource Strain: Not on file  Food Insecurity: Not on file  Transportation Needs: Not on file  Physical Activity: Not on file  Stress: Not on file  Social Connections: Not on file  Intimate Partner Violence: Not on file     PHYSICAL EXAM  GENERAL EXAM/CONSTITUTIONAL: Vitals:  Vitals:   01/03/22 1453  BP: 130/63  Pulse: 63  Weight: 125 lb (56.7 kg)  Height: '5\' 7"'$  (1.702 m)   Body mass index is 19.58 kg/m. Wt Readings from Last 3 Encounters:  01/03/22 125 lb (56.7 kg)  02/11/21 115 lb 15.4 oz (52.6 kg)  05/25/20 116 lb (52.6 kg)   Patient is in no distress; well developed, nourished and groomed; neck is supple   NEUROLOGIC:    01/03/2022    3:15 PM  Montreal Cognitive Assessment   Visuospatial/ Executive (0/5) 3  Naming (0/3) 3  Attention: Read list of digits (0/2) 2   Attention: Read list of letters (0/1) 1  Attention: Serial 7 subtraction starting at 100 (0/3) 3  Language: Repeat phrase (0/2) 2  Language : Fluency (0/1) 1  Abstraction (0/2) 2  Delayed Recall (0/5) 0  Orientation (0/6) 6  Total 23    CRANIAL NERVE:  2nd, 3rd, 4th, 6th - pupils equal and reactive to light, visual fields full to confrontation,  extraocular muscles intact, no nystagmus 5th - facial sensation symmetric 7th - facial strength symmetric 8th - hearing intact 9th - palate elevates symmetrically, uvula midline 11th - shoulder shrug symmetric 12th - tongue protrusion midline  MOTOR:  normal bulk and tone, no cogwheeling, full strength in the BUE, BLE  SENSORY:  normal and symmetric to light touch all 4 extremities  COORDINATION:  finger-nose-finger, fine finger movements normal, no tremor  REFLEXES:  deep tendon reflexes present and symmetric  GAIT/STATION:  normal     DIAGNOSTIC DATA (LABS, IMAGING, TESTING) - I reviewed patient records, labs, notes, testing and imaging myself where available.  Lab Results  Component Value Date   WBC 4.4 05/28/2020   HGB 12.4 05/28/2020   HCT 37.7 05/28/2020   MCV 91.1 05/28/2020   PLT 193 05/28/2020      Component Value Date/Time   NA 137 05/28/2020 0332   K 3.6 05/28/2020 0332   CL 106 05/28/2020 0332   CO2 22 05/28/2020 0332   GLUCOSE 72 05/28/2020 0332   BUN 18 05/28/2020 0332   CREATININE 0.86 05/28/2020 0332   CALCIUM 8.4 (L) 05/28/2020 0332   PROT 6.1 (L) 05/28/2020 0332   ALBUMIN 3.1 (L) 05/28/2020 0332   AST 68 (H) 05/28/2020 0332   ALT 38 05/28/2020 0332   ALKPHOS 51 05/28/2020 0332   BILITOT 0.5 05/28/2020 0332   GFRNONAA >60 05/28/2020 0332   Lab Results  Component Value Date   TRIG 53 05/25/2020   12/01/21 TSH 0.38, B12 785   ASSESSMENT AND PLAN  81 y.o. year old female with a history of HTN, COPD, hypothyroidism who presents for evaluation of memory loss over the past year. Her MOCA  score today is 23/30, consistent with mild cognitive impairment. Will order MRI brain to assess for signs of neurodegeneration and/or significant vascular disease. Discussed diagnosis of MCI and increased risk of developing dementia. Discussed general brain health measures including regular cognitive and physical exercise.   1. Memory loss       PLAN: - MRI brain  - Discussed safety measures including limiting driving to familiar places at short distances - Follow up in 6 months  Orders Placed This Encounter  Procedures   MR BRAIN WO CONTRAST    No orders of the defined types were placed in this encounter.   Return in about 6 months (around 07/05/2022).  I spent an average of 50 chart reviewing and counseling the patient, with at least 50% of the time face to face with the patient. General brain health measures discussed, including the importance of regular aerobic exercise. Reviewed safety measures including driving safety.   Genia Harold, MD 01/03/22 4:31 PM  [facility] [address], [place], [place] [address] [phone]

## 2022-01-04 ENCOUNTER — Telehealth: Payer: Self-pay | Admitting: Psychiatry

## 2022-01-04 NOTE — Telephone Encounter (Signed)
[employer] medicare sent to [facility] they obtain auth and call patient to schedule

## 2022-01-15 ENCOUNTER — Ambulatory Visit
Admission: RE | Admit: 2022-01-15 | Discharge: 2022-01-15 | Disposition: A | Discharge status: Home / Self Care | Payer: Medicare HMO | Source: Ambulatory Visit | Attending: Psychiatry | Admitting: Psychiatry

## 2022-01-15 DIAGNOSIS — R413 Other amnesia: Secondary | ICD-10-CM

## 2022-01-17 ENCOUNTER — Other Ambulatory Visit: Payer: Self-pay | Admitting: Psychiatry

## 2022-01-17 DIAGNOSIS — R413 Other amnesia: Secondary | ICD-10-CM

## 2022-01-18 ENCOUNTER — Telehealth: Payer: Self-pay | Admitting: Psychiatry

## 2022-01-18 NOTE — Telephone Encounter (Signed)
Referral for Neuropsychology sent to [facility] [phone]. 

## 2022-07-12 ENCOUNTER — Encounter: Payer: Self-pay | Admitting: Psychiatry

## 2022-07-12 ENCOUNTER — Ambulatory Visit (INDEPENDENT_AMBULATORY_CARE_PROVIDER_SITE_OTHER): Payer: Medicare HMO | Admitting: Psychiatry

## 2022-07-12 VITALS — BP 125/58 | HR 62 | Ht 66.0 in | Wt 124.0 lb

## 2022-07-12 DIAGNOSIS — G309 Alzheimer's disease, unspecified: Secondary | ICD-10-CM | POA: Diagnosis not present

## 2022-07-12 DIAGNOSIS — F02A0 Dementia in other diseases classified elsewhere, mild, without behavioral disturbance, psychotic disturbance, mood disturbance, and anxiety: Secondary | ICD-10-CM

## 2022-07-12 MED ORDER — DONEPEZIL HCL 5 MG PO TABS: 5.0000 mg | ORAL_TABLET | Freq: Every day | ORAL | 0 refills | Status: DC

## 2022-07-12 MED ORDER — DONEPEZIL HCL 10 MG PO TABS: 10.0000 mg | ORAL_TABLET | Freq: Every day | ORAL | 6 refills | Status: DC

## 2022-07-12 NOTE — Progress Notes (Signed)
   CC:  memory loss  Follow-up Visit  Last visit: 01/03/22  Brief HPI: 81 year old female with a history of HTN, COPD, hypothyroidism who follows in clinic for memory loss.  At her last visit, brain MRI was ordered.  Interval History: Brain MRI 01/15/22 showed mild generalized atrophy which was more prominent in the medial temporal lobes, R>L. It also showed advanced chronic microvascular ischemic changes.  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  Her memory has been slowly worsening over time. She cannot keep track of the days of the week or the month. Continues to repeat questions multiple times. Son has taken over her finances.   Physical Exam:   Vital Signs: BP (!) 125/58   Pulse 62   Ht '5\' 6"'$  (1.676 m)   Wt 124 lb (56.2 kg)   BMI 20.01 kg/m  GENERAL:  well appearing, in no acute distress, alert  SKIN:  Color, texture, turgor normal. No rashes or lesions HEAD:  Normocephalic/atraumatic. RESP: normal respiratory effort MSK:  No gross joint deformities.   NEUROLOGICAL: Mental Status:     07/12/2022    4:22 PM 01/03/2022    3:15 PM  Montreal Cognitive Assessment   Visuospatial/ Executive (0/5) 4 3  Naming (0/3) 2 3  Attention: Read list of digits (0/2) 2 2  Attention: Read list of letters (0/1) 1 1  Attention: Serial 7 subtraction starting at 100 (0/3) 3 3  Language: Repeat phrase (0/2) 1 2  Language : Fluency (0/1) 1 1  Abstraction (0/2) 1 2  Delayed Recall (0/5) 0 0  Orientation (0/6) 3 6  Total 18 23   Cranial Nerves: PERRL, face symmetric, no dysarthria, hearing grossly intact Motor: moves all extremities equally Gait: normal-based.  IMPRESSION: 81 year old female with a history of HTN, COPD, hypothyroidism who presents for follow up of memory loss. MRI of the brain showed atrophy more prominent in the medial temporal lobes as well as advanced  chronic microvascular changes. 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 MOCA score today is 18/30, which is a significant decline from her prior score 6 months ago. Discussed diagnosis of dementia and treatment options. Will start donepezil for her memory.  PLAN: -Start donepezil 5 mg daily x 4 weeks, then increase to 10 mg daily -Counseled to avoid driving -Dementia resource packet for [facility] provided   Follow-up: 6 months  I spent a total of 36 minutes on the date of the service. Discussed medication side effects, adverse reactions and drug interactions. Written educational materials and patient instructions outlining all of the above were given.  Genia Harold, MD 07/12/22 4:34 PM

## 2022-07-12 NOTE — Patient Instructions (Addendum)
Donepezil We recommended that you take Donepezil (Aricept) '5mg'$  tab once a day for the first 4 weeks, then increase the dose to '10mg'$  once per day. It is better to take Donepezil with breakfast or lunch. Aricept is well tolerated, although some people may experience nausea or diarrhea. These side effects were usually mild and temporary. If symptoms continue or become severe, you should stop the medication and contact us.          DEMENTIA OVERVIEW "Dementia" is a general term for when a person has developed difficulties with reasoning, judgment, and memory. People who have dementia usually have some memory loss as well as difficulty in at least one other area, such as: ?Speaking or writing coherently (or understanding what is said or written) ?Recognizing familiar surroundings ?Planning and carrying out complex or multi-step tasks In order to be considered dementia these issues must be severe enough to interfere with a person's independence and daily activities. Dementia can be caused by several diseases that affect the brain. The most common cause is Alzheimer disease. Alzheimer disease is present in approximately 48 to 78 percent of all cases of dementia; other degenerative and/or vascular diseases may be present as well, particularly as a person gets older.   DEMENTIA RISK FACTORS There is no way to predict with certainty who will develop dementia. Each form of dementia has its own risk factors, but most forms have several risk factors in common. Age -- The biggest risk factor for dementia is age: dementia is rare in people younger than 66 years and becomes very common in people older than 48. For example, dementia affects approximately one in six people between 65 and 68 years old, one in three above 45 years, and almost half of people over age 3.  Family history -- Some forms of dementia have a genetic component, meaning that they tend to run in families. Having a close family member with  Alzheimer disease increases your chances of developing it. People with a first-degree relative (parent or sibling) with Alzheimer disease have a greater chance of developing the disorder. The risk is probably highest if the family member developed Alzheimer disease at a younger age (less than 61 years old) and is lower if the family member did not get Alzheimer disease until late in life. However, families that have a very strong genetic tendency toward Alzheimer disease are uncommon.  Other factors -- Studies indicate that high blood pressure, smoking, and diabetes may be risk factors for dementia. Experts are still not sure how treatment for these problems might influence your risk of developing dementia beyond their benefit of reducing stroke risk. Lifestyle factors have also been implicated in dementia. For instance, people who remain physically active, socially connected, and mentally engaged seem less likely to develop dementia than people who do not. These activities may produce more cognitive (mental) reserve or resilience, delaying the emergence of symptoms until an older age.  DEMENTIA SYMPTOMS Each form of dementia can cause difficulty with memory, language, reasoning, and judgment, but the symptoms are often very different from person to person. Symptoms also change over time. The differences between one form of dementia and another may only be recognizable to skilled health care providers who have experience working with people with dementia. Sometimes family members notice changes but mistakenly attribute them to aging.  Is memory loss normal? -- Many people worry that memory problems are related to early Alzheimer disease. However, some problems are normal and just related to aging, and  do not signify a progressive dementia. Normal age-related changes often cause minor difficulties with immediate memory, for example, remembering a phone number or a set of directions for a short time.  Temporary difficulty recalling proper names, even very familiar ones, is also common with aging. As people age normally, it is common to complain of less efficient and slower processing and learning of new information. Memory changes due to normal aging are usually mild and do not worsen greatly over time, nor should they interfere with a person's day-to-day functioning.  Early changes -- The earliest symptoms of Alzheimer disease are gradual and often subtle. Many people and their families first notice difficulty recalling recent events or information. This often emerges as a tendency to repeat stories or questions or to request or require repetition of material to be able to remember. If you find yourself telling an older family member or friend "I told you that earlier" or "You have told me that more than once," you might begin to suspect Alzheimer disease. Other changes can include one or more of the following: ?Difficulties with language (eg, not being able to find the right words for things) ?Difficulty with concentration and reasoning ?Problems with complex tasks like paying bills, cooking, or balancing a checkbook ?Getting lost in a familiar place  Late changes -- As Alzheimer disease progresses, a person's ability to think clearly continues to decline, and any or all of the changes listed above may be more disruptive. In addition, personality and behavioral symptoms can become quite troublesome. These can include: ?Increased anger or hostility, sometimes aggressive behavior; alternatively, some people become depressed or exhibit little interest in their surroundings (called "apathy") ?Sleep problems ?Hallucinations and/or delusions ?Disorientation ?Needing help with basic tasks (such as eating, bathing, and dressing) ?Incontinence (difficulty controlling the bladder and/or bowels)  The number of symptoms, the functions that are impaired, and the speed with which symptoms progress can vary  widely from one person to the next. In some people, severe dementia occurs within five years of the diagnosis; for others, the progression can take more than 10 years. Most people with Alzheimer disease do not die from the disease itself, but rather from a secondary illness such as pneumonia, bladder infection, or complications of a fall.  DEMENTIA DIAGNOSIS To diagnose dementia and identify the type of dementia, health care providers typically rely on the information they can gather by interacting with the person and speaking with their family members. The provider will typically perform memory and other cognitive (thinking) tests to assess the person's degree of difficulty with different types of problems. The results of these tests can then be monitored over time to observe whether functioning stays the same or declines.  Blood tests are usually done to find out if a chemical or hormonal imbalance or vitamin deficiency is contributing to the person's difficulties. Brain scans (usually MRI) are often performed in people with dementia to look for other problems. Sometimes the MRI can also help health care providers identify the type of dementia, since different types can have characteristic brain changes.  SAFETY AND LIFESTYLE ISSUES FOR PEOPLE WITH DEMENTIA A major issue for caregivers is making sure the person with dementia stays safe. Because many people with dementia do not realize that their mental functioning is impaired, they try to continue their day-to-day activities as usual. This can lead to physical danger, and caregivers must help to avoid situations that can threaten the safety of the person or others. The following information applies specifically  to people with Alzheimer disease, but much of it is also relevant to people with other forms of dementia.  Medications -- People with Alzheimer disease often have trouble remembering to take medications they are prescribed for other conditions,  or they become confused about which medications to take. They are also at increased risk for potentially dangerous side effects from certain medications. Sedatives and certain other drugs (such as some antihistamines and antidepressants) may carry risk of increasing cognitive impairment. It is important to develop a plan for medication monitoring and safety. People with dementia often need help taking their medications. It's a good idea to throw away old pill bottles and other medications that are no longer needed.  Driving -- Driving is often one of the first safety issues that arises in people with Alzheimer disease. In people with Alzheimer disease, the risk of having a car accident is significantly increased, especially as the disease progresses. It is best to discuss the issue of driving early, before the symptoms become advanced. Over time, everyone living long enough with dementia will reach a point where driving is too dangerous. Losing the ability to drive can be hard to accept because it represents independence for many people. It can also be challenging if the person does not completely appreciate their impairments in mental functioning or reaction time. In particular, driving at night may carry extra risk. Many people with mild but worrisome impairments will insist that they can safely drive locally or in the daytime. That may be true for the present time; however, people may forget that they have agreed to limitations. In addition, their ability to drive safely, even with restrictions, will deteriorate over time. A roadside driving test is often recommended if there is disagreement or uncertainty about a person's ability to drive. However, if a person with newly diagnosed, mild Alzheimer disease is deemed still able to drive, they will need to be reassessed every six months, with the understanding that driving will eventually no longer be possible. There may be important insurance implications of  continued driving when medical records document advice to stop driving.  Cooking -- Cooking is another area that can lead to serious safety concerns and may require help or supervision. Symptoms such as distractibility, forgetfulness, and difficulty following directions can lead to burns, fires, or other injuries. The use of gas cooking appliance raises a particular concern. A family member may have to ask the utility company to disconnect gas stoves if there is potential for accident or injury. Newer induction electric stoves do not change color when on, and may carry an inadvertent burn risk if a person forgets what they are doing.  Wandering -- As dementia progresses, some people with Alzheimer disease begin to wander. Because restlessness, distractibility, and memory problems are common, a person who wanders may easily become lost. Identification bracelets can help ensure that a lost wanderer gets home. The [employer] provides a "Wandering Support" program that provides ID tags and 24-hour assistance to patients who are preregistered for this program. There are many "locator" applications that allow the person with dementia to wear or carry a GPS device that a family member can track with their cell phone. Regular exercise may decrease the restlessness that can lead to wandering. Exercise is also just good practice to maintain strength, good sleep, and overall health. If wandering continues, wearable alarm systems are available that alert caretakers when the person leaves the home.  Falls -- Falls with secondary injuries are one of the  most important causes of additional disability in people with all types of dementia, including Alzheimer disease. Commonly used medications can increase risk of falls and injuries. Hip fracture is a particular concern in older people, as it can lead to serious complications and sometimes even death. To reduce the risk of falls, potential tripping hazards such  as loose electrical cords, slippery rugs, and clutter should be removed. Inadvertent hoarding may develop and pose a safety risk. If the person lives alone, family members or elder services should perform safety inspections of the living space periodically. Medication lists should also be reviewed with your doctor to identify those that might increase the risk of falling. Regular exercise, especially early in the course of dementia, and use of assistive devices like canes can also help with balance.  DEMENTIA TREATMENT Medications for Alzheimer disease -- Many people with Alzheimer disease will have the option of trying a medication. A trial of medication is usually begun for a period of a few to several weeks while the person is monitored for side effects and response. A health care provider should periodically review all medications to see if they are providing any benefit. It is important to have realistic expectations about the potential benefits of medication therapy in Alzheimer disease. None of these medications cure the disease, and the reality is that over time the person will continue to worsen. When medication does have an effect, the goal is not to stop progression of the disease, but to improve quality of life for the person and their family to the extent possible.  Treatment to slow or delay progression -- There is currently no cure for Alzheimer disease. However, experts are studying treatments in the hope of finding a way to slow the progression of the mental and functional decline, along with scientific efforts to prevent or delay onset.  Treatment of memory problems -- There are several medicines currently available for treating the memory problems associated with Alzheimer disease; they are also used in people with other forms of dementia.  ?Donepezil (brand name: Aricept) This medications allow more of a chemical called acetylcholine to be active in the brain, making up for drops in  acetylcholine levels that happen in Alzheimer disease. It can cause side effects such as nausea, vomiting, and diarrhea in some people. It also may cause weight loss in many people. When taken at bedtime, cholinesterase inhibitors can cause very vivid dreams. If there is no improvement in symptoms or side effects are bothersome, the medication should be stopped. Sometimes the person's symptoms will worsen after treatment is stopped; if this happens, the medication may be started again.  Treatment of behavioral symptoms -- The behavioral symptoms of Alzheimer disease are often more troubling than the cognitive (mental) symptoms. Even in mild cases, agitation, anxiety, and irritability can occur, and generally worsen as Alzheimer disease advances. This can be stressful for the person as well as for their family and caregivers. A combination of medications and behavioral therapy may be helpful. Non-medication therapies are preferred, as virtually all medications used for behavioral symptoms can increase confusion and many are associated with serious side effects and even an increased risk of death.  Depression -- Depression is common, especially in the early phases of dementia. It may be treated with behavioral therapy and/or with medications. The key is to recognize that depression may be playing a role in the person's symptoms. If depression is causing distress, it is worth treating. Potentially helpful medicines include a group of medicines  known as selective serotonin reuptake inhibitors, or SSRIs, which are usually preferred over other choices in patients with dementia. Widely used SSRIs include fluoxetine (brand name: Prozac), sertraline (brand name: Zoloft), paroxetine (sample brand names: Brisdelle, Paxil), citalopram (brand name: Celexa), and escitalopram (brand names: Lexapro, Cipralex).   A variety of behavioral therapies are often helpful, do not have the side effects often seen with medications, and  may be recommended for depression. Behavioral therapy involves changing the person's environment (eg, regular exercise, avoiding triggers that cause sadness, socializing with others, engaging in pleasant activities that a person enjoys).  Agitation and aggression -- One of the most difficult issues for caregivers and people with Alzheimer disease is aggressive behavior. Fortunately, this behavior is not common. However, many family members are reluctant to report aggressive behavior. In some cases, the behavior becomes physically abusive as dementia progresses. Agitation and aggression can be caused by a number of factors, including: ?Confusion, misunderstanding, or disorientation (doctors use the term "delirium" as a general term to describe a state of confusion in which a person does not think or behave normally) ?Frightening or paranoid delusions or hallucinations ?Depression or anxiety ?Sleep disorders, such as reduced sleep or altered sleep/wake cycles ?Certain medical conditions that can cause delirium, such as urinary tract infection or pneumonia ?Being in physical pain or discomfort ?Side effects of certain medications Delusions (ie, believing something that is not real or true) are common in patients with dementia, occurring in up to 30 percent of those with advanced disease. Paranoid delusions are particularly distressing to both the patients and the caregivers: these often include beliefs that someone has invaded the house, that family members have been replaced by impostors, that spouses have been unfaithful, or that personal possessions have been stolen.   Family members should discuss any concerns about aggressive behavior with a health care provider and arrange for help if necessary. The best treatment for these symptoms depends upon what triggers them. As an example, a person who becomes aggressive during periods of confusion might best be treated by talking through the problem, while  someone who becomes aggressive during delusions might require medication. Often, behavior improves once an underlying medical condition is treated. Caregivers can learn strategies to help lessen the number of triggers and confrontations.   Sleep problems -- Sleep disorders can be treated with either medicine or behavior changes or both: for example, limiting daytime naps, increasing physical activity, avoiding caffeine and alcohol in the evening. In some people, medication to help with sleep may be recommended, although these medications almost always have side effects (eg, worsened confusion and increased risk of falls). Maintaining daily rhythms, using artificial lighting when needed during the day, and avoiding bright light exposures during the night may help maintain normal wake-sleep cycles.   COPING WITH DEMENTIA Being diagnosed with any form of dementia can be distressing and overwhelming for the person affected as well as their loved ones. For people with dementia -- It is important for people with early dementia to care for their physical and mental health. This means getting regular checkups, taking medicines if needed, eating a healthy diet, exercising regularly, getting enough sleep, and avoiding activities that may be risky. It is often helpful to talk to others through support groups or a counselor or social worker to discuss any feelings of anxiety, frustration, anger, loneliness, or depression. All of these feelings are normal, and dealing with these feelings can help you to feel more in control of your life and health.  It can also help to talk to other people who are going through a similar experience. Another issue to consider is how to tell your family and friends about your diagnosis. Explaining the disease can help others to understand what to expect and how they can help, now and in the future. This can be especially helpful for children and grandchildren, who may not be familiar with  the condition. While many people are able to live alone in the early stages of dementia, you may need help with tasks such as housekeeping, cooking, transportation, and paying bills. If possible, ask a friend or family member for help making plans to deal with these and other issues as dementia progresses. Occupational therapists, and sometimes speech pathologists, can help to set up your home to minimize confusion and keep you independent for as long as possible. It's also important to establish Power of Attorney and Health Care Proxy statuses early, before a financial or health care crisis happens. This involves completing paperwork to determine who can make decisions on your behalf if needed. In addition, you should discuss your preferences regarding issues that are likely to become important as your dementia worsens, including: ?Is health insurance available, and what does it cover? ?Where will I live? ?Who will make health care and end-of-life decisions if I can't make them for myself? ?Who will pay for care? A number of resources are available to assist in this type of planning.   For caregivers -- Dementia can also impose an enormous burden on families and other caregivers. People with dementia become less able to care for themselves as the condition progresses. If you are caring for someone with dementia, the following may help: ?Make a daily plan and prepare to be flexible if needed. ?Try to be patient when responding to repetitive questions, behaviors, or statements. ?Try not to argue or confront the person with dementia when they express mistaken ideas or facts. Change the subject or gently remind the person of an inaccuracy. Arguing or trying to convince a person of "the truth" is a natural reaction but it can be frustrating to all and can trigger unwanted behavior and feelings. ?Use memory aids such as writing out a list of daily activities, phone numbers, and instructions for usual tasks  (ie, the telephone, microwave, etc). It may help if these are posted and easily visible so that the person need not remember to look for the aids. ?Establish calm and consistent nighttime routines to manage behavioral problems, which are often worst at night. Leave a night light on in the person's bedroom. ?Avoid major changes to the home environment (for example, rearranging furniture). ?Employ safety measures in the home, such as putting locks on medicine cabinets, keeping furniture in the same place to prevent falls, reducing clutter, removing electrical appliances from the bathroom, installing grab bars in the bathroom, and setting the water heater below 120F. ?Help the person with personal care tasks as needed. It is not necessary to bathe every day, although a health care provider should be notified if the person develops sores in the mouth or genitals related to hygiene problems (eg, ill-fitting dentures, urine leakage). ?Speak slowly, present only one idea at a time, and be patient when waiting for responses. ?Encourage physical activity and exercise. Even a daily walk can help prevent physical decline and improve behavioral problems. ?Consider respite care. Respite care can provide a needed break and give you a chance to recharge. This is offered in many communities in the form  of in-home care or adult day care. Caregiving can be an all-consuming experience, and it's essential to take time for yourself, take care of your own medical problems, and arrange for breaks when you need them. ?See if your area has a support group for people caring for loved ones with dementia. It can help to talk with other people who understand what you are going through.

## 2022-07-26 ENCOUNTER — Other Ambulatory Visit: Payer: Self-pay | Admitting: Psychiatry

## 2022-08-18 ENCOUNTER — Other Ambulatory Visit: Payer: Self-pay | Admitting: Psychiatry

## 2022-09-09 ENCOUNTER — Other Ambulatory Visit: Payer: Self-pay | Admitting: Psychiatry

## 2022-09-11 NOTE — Telephone Encounter (Signed)
Follow up scheduled on 02/05/23.  Per office note 07/12/22 "Start donepezil 5 mg daily x 4 weeks, then increase to 10 mg daily :

## 2022-09-29 ENCOUNTER — Other Ambulatory Visit: Payer: Self-pay | Admitting: Psychiatry

## 2022-10-02 NOTE — Telephone Encounter (Signed)
Follow up schedule on 03/01/23 per note "Start donepezil 5 mg daily x 4 weeks, then increase to 10 mg daily "

## 2023-02-05 ENCOUNTER — Ambulatory Visit: Payer: Medicare HMO | Admitting: Adult Health

## 2023-03-01 ENCOUNTER — Ambulatory Visit: Payer: Medicare HMO | Admitting: Adult Health

## 2023-05-23 ENCOUNTER — Ambulatory Visit (INDEPENDENT_AMBULATORY_CARE_PROVIDER_SITE_OTHER): Payer: Medicare HMO | Admitting: Adult Health

## 2023-05-23 ENCOUNTER — Encounter: Payer: Self-pay | Admitting: Adult Health

## 2023-05-23 VITALS — BP 106/58 | HR 57 | Ht 66.0 in | Wt 125.4 lb

## 2023-05-23 DIAGNOSIS — G309 Alzheimer's disease, unspecified: Secondary | ICD-10-CM

## 2023-05-23 DIAGNOSIS — F02A0 Dementia in other diseases classified elsewhere, mild, without behavioral disturbance, psychotic disturbance, mood disturbance, and anxiety: Secondary | ICD-10-CM | POA: Diagnosis not present

## 2023-05-23 MED ORDER — MEMANTINE HCL 5 MG PO TABS: 5.0000 mg | ORAL_TABLET | Freq: Every day | ORAL | 11 refills | Status: DC

## 2023-05-23 NOTE — Patient Instructions (Signed)
Your Plan:   Retry Namenda 5 mg at bedtime      Thank you for coming to see [place] at [facility]. I hope we have been able to provide you high quality care today.  You may receive a patient satisfaction survey over the next few weeks. We would appreciate your feedback and comments so that we may continue to improve ourselves and the health of our patients.

## 2023-05-23 NOTE — Progress Notes (Signed)
PATIENT: ALEKSI Grant DOB: 01-02-1941  REASON FOR VISIT: follow up HISTORY FROM: patient PRIMARY NEUROLOGIST: previously Dr. Delena Bali  Chief Complaint  Patient presents with   Follow-up    Pt in 19 with daughter Pt here for memory f/u Daughter states slight decline in short term memory  Daughter states pt stopped taking Aricept      HISTORY OF PRESENT ILLNESS: Today 05/23/23  [name] is a 82 y.o. female who has been followed in this office for dementia. Returns today for follow-up.  She is here today with her daughter.  Her daughter feels that her memory may be worse.  She lives at home with her daughter.  She is able to stay there during the day by herself but her daughter is there with her at night.  Her daughter helps manage her medications and appointments.  Daughter reports that her brother is in charge of the finances and his power of attorney.  Patient reports that she sleeps well.  Does not operate a motor vehicle.  HISTORY ( Copied from Dr. Quentin Mulling note):  82 year old female with a history of HTN, COPD, hypothyroidism who follows in clinic for memory loss.   At her last visit, brain MRI was ordered.   Interval History: Brain MRI 01/15/22 showed mild generalized atrophy which was more prominent in the medial temporal lobes, R>L. It also showed advanced chronic microvascular ischemic changes.  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   Her memory has been slowly worsening over time. She cannot keep track of the days of the week or the month. Continues to repeat questions multiple times. Son has taken over her finances.  REVIEW OF SYSTEMS: Out of a complete 14 system review of symptoms, the patient complains only of the following symptoms, and all other reviewed systems are negative.  ALLERGIES: Allergies  Allergen Reactions   Brimonidine Itching     Red eyes   Naphazoline-Polyethyl Glycol     THAT YOU USE TO DILATE YOUR EYES.Marland KitchenMAKES BP GO UP, CAN ONLY USE HALF STRENGTH   Tropicamide Other (See Comments)    Elevates IOP OK to use 0.5%    HOME MEDICATIONS: Outpatient Medications Prior to Visit  Medication Sig Dispense Refill   Calcium Carbonate-Vitamin D 600-400 MG-UNIT tablet Take 1 tablet by mouth daily.     Cholecalciferol (VITAMIN D-3 PO) Take 800 mg by mouth daily.      dorzolamide-timolol (COSOPT) 22.3-6.8 MG/ML ophthalmic solution Place 2 drops into both eyes 2 (two) times daily.      ketorolac (ACULAR) 0.5 % ophthalmic solution Place 1 drop into the right eye 4 (four) times daily.     levothyroxine (SYNTHROID) 88 MCG tablet Take 88 mcg by mouth daily.     lovastatin (MEVACOR) 40 MG tablet Take 40 mg by mouth at bedtime.     LUMIGAN 0.01 % SOLN Place 1 drop into both eyes at bedtime.      metoprolol succinate (TOPROL-XL) 100 MG 24 hr tablet Take 100 mg by mouth daily.     Multiple Vitamins-Minerals (CENTRUM SILVER PO) Take 1 tablet by mouth daily.      Omega-3 Fatty Acids (FISH OIL) 500 MG CAPS Take 500 mg by mouth daily.     valsartan-hydrochlorothiazide (DIOVAN-HCT) 160-12.5 MG tablet Take 1 tablet by mouth daily.     donepezil (ARICEPT) 10 MG tablet Take 1 tablet (10 mg  total) by mouth daily. (Patient not taking: Reported on 05/23/2023) 30 tablet 6   donepezil (ARICEPT) 5 MG tablet Take 1 tablet (5 mg total) by mouth daily. Take for 4 weeks, then increase to 10 mg daily (Patient not taking: Reported on 05/23/2023) 30 tablet 0   No facility-administered medications prior to visit.    PAST MEDICAL HISTORY: Past Medical History:  Diagnosis Date   Cancer (HCC)    scc of oral cavity   Cataract    COPD (chronic obstructive pulmonary disease) (HCC)    Glaucoma    Goiter    Hypertension    Hypothyroid    Memory loss    Osteoporosis    Psoriasis    Scoliosis    Thyroid disease    Wears glasses     PAST SURGICAL  HISTORY: Past Surgical History:  Procedure Laterality Date   CESAREAN SECTION  07/31/1976   TWINS, ONE WAS STILLBORN   MOUTH SURGERY  07/31/1994   tongue resection   NODULE REMOVED     BENIGN, LOWER NECK    FAMILY HISTORY: Family History  Problem Relation Age of Onset   Thyroid disease Mother    Dementia Mother    Thyroid disease Sister     SOCIAL HISTORY: Social History   Socioeconomic History   Marital status: Widowed    Spouse name: Not on file   Number of children: 3   Years of education: Not on file   Highest education level: Not on file  Occupational History   Not on file  Tobacco Use   Smoking status: Former    Current packs/day: 0.00    Average packs/day: 0.2 packs/day for 2.0 years (0.4 ttl pk-yrs)    Types: Cigarettes    Start date: 07/31/1992    Quit date: 07/31/1994    Years since quitting: 28.8   Smokeless tobacco: Never   Tobacco comments:    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  Vaping Use   Vaping status: Never Used  Substance and Sexual Activity   Alcohol use: No   Drug use: No   Sexual activity: Not on file  Other Topics Concern   Not on file  Social History Narrative   12/28/21 lives alone   Social Determinants of Health   Financial Resource Strain: Not on file  Food Insecurity: Not on file  Transportation Needs: Not on file  Physical Activity: Not on file  Stress: Not on file  Social Connections: Not on file  Intimate Partner Violence: Not on file      PHYSICAL EXAM  Vitals:   05/23/23 1451  BP: (!) 106/58  Pulse: (!) 57  Weight: 125 lb 6.4 oz (56.9 kg)  Height: 5\' 6"  (1.676 m)   Body mass index is 20.24 kg/m.      05/23/2023    2:54 PM 07/12/2022    4:22 PM 01/03/2022    3:15 PM  Montreal Cognitive Assessment   Visuospatial/ Executive (0/5) 2 4 3   Naming (0/3) 3 2 3   Attention: Read list of digits (0/2) 2 2 2   Attention: Read list of letters (0/1) 1 1 1   Attention: Serial 7 subtraction starting at 100 (0/3) 3 3 3   Language:  Repeat phrase (0/2) 2 1 2   Language : Fluency (0/1) 1 1 1   Abstraction (0/2) 1 1 2   Delayed Recall (0/5) 0 0 0  Orientation (0/6) 2 3 6   Total 17 18 23        Generalized: Well developed,  in no acute distress   Neurological examination  Mentation: Alert oriented to time, place, history taking. Follows all commands speech and language fluent Cranial nerve II-XII: Pupils were equal round reactive to light. Extraocular movements were full, visual field were full on confrontational test. Facial sensation and strength were normal.  Head turning and shoulder shrug  were normal and symmetric. Motor: The motor testing reveals 5 over 5 strength of all 4 extremities. Good symmetric motor tone is noted throughout.  Sensory: Sensory testing is intact to soft touch on all 4 extremities. No evidence of extinction is noted.  Coordination: Cerebellar testing reveals good finger-nose-finger and heel-to-shin bilaterally.  Gait and station: Gait is normal.   DIAGNOSTIC DATA (LABS, IMAGING, TESTING) - I reviewed patient records, labs, notes, testing and imaging myself where available.  Lab Results  Component Value Date   WBC 4.4 05/28/2020   HGB 12.4 05/28/2020   HCT 37.7 05/28/2020   MCV 91.1 05/28/2020   PLT 193 05/28/2020      Component Value Date/Time   NA 137 05/28/2020 0332   K 3.6 05/28/2020 0332   CL 106 05/28/2020 0332   CO2 22 05/28/2020 0332   GLUCOSE 72 05/28/2020 0332   BUN 18 05/28/2020 0332   CREATININE 0.86 05/28/2020 0332   CALCIUM 8.4 (L) 05/28/2020 0332   PROT 6.1 (L) 05/28/2020 0332   ALBUMIN 3.1 (L) 05/28/2020 0332   AST 68 (H) 05/28/2020 0332   ALT 38 05/28/2020 0332   ALKPHOS 51 05/28/2020 0332   BILITOT 0.5 05/28/2020 0332   GFRNONAA >60 05/28/2020 0332   Lab Results  Component Value Date   TRIG 53 05/25/2020      ASSESSMENT AND PLAN 82 y.o. year old female  has a past medical history of Cancer (HCC), Cataract, COPD (chronic obstructive pulmonary disease)  (HCC), Glaucoma, Goiter, Hypertension, Hypothyroid, Memory loss, Osteoporosis, Psoriasis, Scoliosis, Thyroid disease, and Wears glasses. here with:  Dementia  -Memory score is stable MoCA 17/30 previously 18 out of 30 -Patient is willing to retry Namenda we will start low-dose 5 mg at bedtime.  Provided the patient and her daughter with information on her after visit summary -Did advise the daughter that if she needed information in regards to additional resources in the home to let us know she verbalized understanding. -Follow-up in 6 to 7 months or sooner if needed    Butch Penny, MSN, NP-C 05/23/2023, 3:14 PM [facility] [address], [address] [place], [place] [address] [phone]

## 2023-06-14 ENCOUNTER — Other Ambulatory Visit: Payer: Self-pay | Admitting: Adult Health

## 2023-06-14 NOTE — Telephone Encounter (Signed)
Rx refilled for 90 day supply, appropriate per last office visit note.

## 2023-08-09 NOTE — Progress Notes (Signed)
 Chief Complaint  Patient presents with   New Patient (Initial Visit)    Cardiac murmur   History of Present Illness: 83 yo female with history of oral cancer, COPD, HTN, HLD, hypothyroidism, memory loss and psoriasis here today as a new consult, referred by Dr. Clarice, for the evaluation of aortic stenosis. Murmur was found in primary care. Echo was done in primary care and read by outside cardiology group. This was read as severe AS with normal LV function. I cannot see the images. She feels well overall. No denies chest pain, dizziness, near syncope or syncope. She does have some dyspnea on exertion and occasional ankle edema. She is here today with her daughter.   Primary Care Physician: Clarice Nottingham, MD   Past Medical History:  Diagnosis Date   Cancer [facility])    scc of oral cavity   Cataract    COPD (chronic obstructive pulmonary disease) (HCC)    Glaucoma    Goiter    Hypertension    Hypothyroid    Memory loss    Osteoporosis    Psoriasis    Scoliosis    Thyroid  disease    Wears glasses     Past Surgical History:  Procedure Laterality Date   CESAREAN SECTION  07/31/1976   TWINS, ONE WAS STILLBORN   MOUTH SURGERY  07/31/1994   tongue resection   NODULE REMOVED     BENIGN, LOWER NECK    Current Outpatient Medications  Medication Sig Dispense Refill   Calcium  Carbonate-Vitamin D  600-400 MG-UNIT tablet Take 1 tablet by mouth daily.     dorzolamide -timolol  (COSOPT ) 22.3-6.8 MG/ML ophthalmic solution Place 2 drops into both eyes 2 (two) times daily.      levothyroxine  (SYNTHROID ) 88 MCG tablet Take 88 mcg by mouth daily.     LUMIGAN 0.01 % SOLN Place 1 drop into both eyes at bedtime.      metoprolol  succinate (TOPROL -XL) 100 MG 24 hr tablet Take 100 mg by mouth daily.     Multiple Vitamins-Minerals (CENTRUM SILVER PO) Take 1 tablet by mouth daily.      rosuvastatin  (CRESTOR ) 5 MG tablet Take 5 mg by mouth daily.     Cholecalciferol (VITAMIN D -3 PO) Take 800 mg by  mouth daily.  (Patient not taking: Reported on 08/10/2023)     donepezil  (ARICEPT ) 10 MG tablet Take 1 tablet (10 mg total) by mouth daily. (Patient not taking: Reported on 08/10/2023) 30 tablet 6   donepezil  (ARICEPT ) 5 MG tablet Take 1 tablet (5 mg total) by mouth daily. Take for 4 weeks, then increase to 10 mg daily (Patient not taking: Reported on 08/10/2023) 30 tablet 0   ketorolac  (ACULAR ) 0.5 % ophthalmic solution Place 1 drop into the right eye 4 (four) times daily. (Patient not taking: Reported on 08/10/2023)     lovastatin (MEVACOR) 40 MG tablet Take 40 mg by mouth at bedtime. (Patient not taking: Reported on 08/10/2023)     memantine  (NAMENDA ) 5 MG tablet TAKE 1 TABLET BY MOUTH EVERYDAY AT BEDTIME (Patient not taking: Reported on 08/10/2023) 90 tablet 4   Omega-3 Fatty Acids (FISH OIL) 500 MG CAPS Take 500 mg by mouth daily. (Patient not taking: Reported on 08/10/2023)     valsartan-hydrochlorothiazide (DIOVAN-HCT) 160-12.5 MG tablet Take 1 tablet by mouth daily. (Patient not taking: Reported on 08/10/2023)     No current facility-administered medications for this visit.    Allergies  Allergen Reactions   Brimonidine Itching    Red  eyes   Naphazoline-Polyethyl Glycol     THAT YOU USE TO DILATE YOUR EYES.SABRAMAKES BP GO UP, CAN ONLY USE HALF STRENGTH   Tropicamide Other (See Comments)    Elevates IOP OK to use 0.5%    Social History   Socioeconomic History   Marital status: Widowed    Spouse name: Not on file   Number of children: 3   Years of education: Not on file   Highest education level: Not on file  Occupational History   Not on file  Tobacco Use   Smoking status: Former    Current packs/day: 0.00    Average packs/day: 0.2 packs/day for 2.0 years (0.4 ttl pk-yrs)    Types: Cigarettes    Start date: 07/31/1992    Quit date: 07/31/1994    Years since quitting: 29.0   Smokeless tobacco: Never   Tobacco comments:    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  Vaping Use   Vaping status:  Never Used  Substance and Sexual Activity   Alcohol  use: No   Drug use: No   Sexual activity: Not on file  Other Topics Concern   Not on file  Social History Narrative   12/28/21 lives alone   Social Drivers of Health   Financial Resource Strain: Not on file  Food Insecurity: Not on file  Transportation Needs: Not on file  Physical Activity: Not on file  Stress: Not on file  Social Connections: Not on file  Intimate Partner Violence: Not on file    Family History  Problem Relation Age of Onset   Thyroid  disease Mother    Dementia Mother    Thyroid  disease Sister     Review of Systems:  As stated in the HPI and otherwise negative.   BP 100/64 (BP Location: Left Arm, Patient Position: Sitting, Cuff Size: Normal)   Pulse 64   Ht 5' 6 (1.676 m)   Wt 58.8 kg   SpO2 97%   BMI 20.92 kg/m   Physical Examination: General: Well developed, well nourished, NAD  HEENT: OP clear, mucus membranes moist  SKIN: warm, dry. No rashes. Neuro: No focal deficits  Musculoskeletal: Muscle strength 5/5 all ext  Psychiatric: Mood and affect normal  Neck: No JVD, no carotid bruits, no thyromegaly, no lymphadenopathy.  Lungs:Clear bilaterally, no wheezes, rhonci, crackles Cardiovascular: Regular rate and rhythm. Harsh systolic murmur.  Abdomen:Soft. Bowel sounds present. Non-tender.  Extremities: No lower extremity edema. Pulses are 2 + in the bilateral DP/PT.  EKG:  EKG is ordered today. The ekg ordered today demonstrates  EKG Interpretation Date/Time:  Friday August 10 2023 09:13:03 EST Ventricular Rate:  64 PR Interval:  172 QRS Duration:  84 QT Interval:  398 QTC Calculation: 410 R Axis:   82  Text Interpretation: Normal sinus rhythm with sinus arrhythmia Confirmed by Verlin Bruckner [id number]) on 08/10/2023 9:14:32 AM    Recent Labs: No results found for requested labs within last 365 days.   Lipid Panel    Component Value Date/Time   TRIG 53 05/25/2020 1953      Wt Readings from Last 3 Encounters:  08/10/23 58.8 kg  05/23/23 56.9 kg  07/12/22 56.2 kg    Assessment and Plan:   1. Severe aortic stenosis: Outside echo suggests severe AS. I cannot see these echo images. Will repeat echo here in our office. I will see her back in several weeks to review treatment options including TAVR. I have briefly discussed TAVR today but will go  into detail at the next visit.   Labs/ tests ordered today include:   Orders Placed This Encounter  Procedures   EKG 12-Lead   ECHOCARDIOGRAM COMPLETE   Disposition:   F/U with me in several weeks.    Signed, Lonni Cash, MD, [facility] 08/10/2023 10:06 AM    [facility] [address] [place], [place], [place]  [address] Phone: [phone]; Fax: [phone]

## 2023-08-10 ENCOUNTER — Ambulatory Visit: Payer: Medicare HMO | Attending: Cardiovascular Disease | Admitting: Cardiovascular Disease

## 2023-08-10 ENCOUNTER — Encounter: Payer: Self-pay | Admitting: Cardiovascular Disease

## 2023-08-10 VITALS — BP 100/64 | HR 64 | Ht 66.0 in | Wt 129.6 lb

## 2023-08-10 DIAGNOSIS — I350 Nonrheumatic aortic (valve) stenosis: Secondary | ICD-10-CM | POA: Diagnosis not present

## 2023-08-10 NOTE — Patient Instructions (Addendum)
 Medication Instructions:  No changes *If you need a refill on your cardiac medications before your next appointment, please call your pharmacy*   Lab Work: none If you have labs (blood work) drawn today and your tests are completely normal, you will receive your results only by: MyChart Message (if you have MyChart) OR A paper copy in the mail If you have any lab test that is abnormal or we need to change your treatment, we will call you to review the results.   Testing/Procedures: Your physician has requested that you have an echocardiogram. Echocardiography is a painless test that uses sound waves to create images of your heart. It provides your doctor with information about the size and shape of your heart and how well your heart's chambers and valves are working. This procedure takes approximately one hour. There are no restrictions for this procedure. Please do NOT wear cologne, perfume, aftershave, or lotions (deodorant is allowed). Please arrive 15 minutes prior to your appointment time.  Please note: We ask at that you not bring children with you during ultrasound (echo/ vascular) testing. Due to room size and safety concerns, children are not allowed in the ultrasound rooms during exams. Our front office staff cannot provide observation of children in our lobby area while testing is being conducted. An adult accompanying a patient to their appointment will only be allowed in the ultrasound room at the discretion of the ultrasound technician under special circumstances. We apologize for any inconvenience.    Follow-Up: As scheduled - see below    1st Floor: - Lobby - Registration  - Pharmacy  - Lab - Cafe  2nd Floor: - PV Lab - Diagnostic Testing (echo, CT, nuclear med)  3rd Floor: - Vacant  4th Floor: - TCTS (cardiothoracic surgery) - AFib Clinic - Structural Heart Clinic - Vascular Surgery  - Vascular Ultrasound  5th Floor: - HeartCare Cardiology (general  and EP) - Clinical Pharmacy for coumadin, hypertension, lipid, weight-loss medications, and med management appointments    Valet parking services will be available as well.

## 2023-08-29 ENCOUNTER — Ambulatory Visit (HOSPITAL_COMMUNITY): Payer: Medicare HMO | Attending: Cardiovascular Disease

## 2023-08-29 DIAGNOSIS — I350 Nonrheumatic aortic (valve) stenosis: Secondary | ICD-10-CM | POA: Diagnosis present

## 2023-08-29 LAB — ECHOCARDIOGRAM COMPLETE
AR max vel: 0.66 cm2
AV Area VTI: 0.64 cm2
AV Area mean vel: 0.65 cm2
AV Mean grad: 46 mm[Hg]
AV Peak grad: 75.4 mm[Hg]
Ao pk vel: 4.34 m/s
Area-P 1/2: 2.72 cm2
S' Lateral: 2.2 cm

## 2023-09-03 ENCOUNTER — Institutional Professional Consult (permissible substitution): Payer: Medicare HMO | Admitting: Cardiovascular Disease

## 2023-09-07 ENCOUNTER — Other Ambulatory Visit: Payer: Self-pay

## 2023-09-07 ENCOUNTER — Encounter: Payer: Self-pay | Admitting: Cardiovascular Disease

## 2023-09-07 ENCOUNTER — Ambulatory Visit: Payer: Medicare HMO | Attending: Cardiovascular Disease | Admitting: Cardiovascular Disease

## 2023-09-07 VITALS — BP 120/62 | HR 69 | Ht 66.0 in | Wt 131.2 lb

## 2023-09-07 DIAGNOSIS — I350 Nonrheumatic aortic (valve) stenosis: Secondary | ICD-10-CM | POA: Diagnosis not present

## 2023-09-07 DIAGNOSIS — Z01812 Encounter for preprocedural laboratory examination: Secondary | ICD-10-CM | POA: Diagnosis not present

## 2023-09-07 NOTE — Addendum Note (Signed)
 Addended by: Antoinette Batman D on: 09/07/2023 03:46 PM   Modules accepted: Orders

## 2023-09-07 NOTE — Progress Notes (Signed)
 [facility] Note  Chief Complaint  Patient presents with   Follow-up    Aortic stenosis   History of Present Illness: 83 yo female with history of oral cancer, COPD, HTN, HLD, hypothyroidism, memory loss, psoriasis and severe aortic stenosis who is here today for follow up. I met her as a new consult in January 2025 for the  evaluation of aortic stenosis. Murmur was found in primary care. Echo was done in primary care and read by outside cardiology group. This was read as severe AS with normal LV function. I could not see the images. She denied chest pain, dizziness, near syncope or syncope but did endorse some dyspnea on exertion and occasional ankle edema. Echo in our office 08/29/23 with LVEF=70-75%. Severe aortic stenosis with mean gradient of 46 mmHg, AVA 0.64 cm2, DI 0.25.   She tells me today that she continues to have dyspnea with exertion. No chest pain or dizziness. Rare LE edema. She has mild short term memory issues. She lives in [place] with daughter. She has no active dental issues. She goes to the dentist regularly.   Primary Care Physician: Clarice Nottingham, MD   Past Medical History:  Diagnosis Date   Aortic stenosis    Cancer (HCC)    scc of oral cavity   Cataract    COPD (chronic obstructive pulmonary disease) (HCC)    Glaucoma    Goiter    Hypertension    Hypothyroid    Memory loss    Osteoporosis    Psoriasis    Scoliosis    Thyroid  disease    Wears glasses     Past Surgical History:  Procedure Laterality Date   CESAREAN SECTION  07/31/1976   TWINS, ONE WAS STILLBORN   MOUTH SURGERY  07/31/1994   tongue resection   NODULE REMOVED     BENIGN, LOWER NECK    Current Outpatient Medications  Medication Sig Dispense Refill   Calcium  Carbonate-Vitamin D  600-400 MG-UNIT tablet Take 1 tablet by mouth daily.     donepezil  (ARICEPT ) 5 MG tablet Take 1 tablet (5 mg total) by mouth daily. Take for 4 weeks, then increase to 10 mg daily 30 tablet 0    dorzolamide -timolol  (COSOPT ) 22.3-6.8 MG/ML ophthalmic solution Place 2 drops into both eyes 2 (two) times daily.      levothyroxine  (SYNTHROID ) 88 MCG tablet Take 88 mcg by mouth daily.     LUMIGAN 0.01 % SOLN Place 1 drop into both eyes at bedtime.      memantine  (NAMENDA ) 5 MG tablet TAKE 1 TABLET BY MOUTH EVERYDAY AT BEDTIME 90 tablet 4   metoprolol  succinate (TOPROL -XL) 100 MG 24 hr tablet Take 100 mg by mouth daily.     Multiple Vitamins-Minerals (CENTRUM SILVER PO) Take 1 tablet by mouth daily.      Omega-3 Fatty Acids (FISH OIL) 500 MG CAPS Take 500 mg by mouth daily.     rosuvastatin  (CRESTOR ) 5 MG tablet Take 5 mg by mouth daily.     valsartan-hydrochlorothiazide (DIOVAN-HCT) 160-12.5 MG tablet Take 1 tablet by mouth daily.     Cholecalciferol (VITAMIN D -3 PO) Take 800 mg by mouth daily. (Patient not taking: Reported on 09/07/2023)     donepezil  (ARICEPT ) 10 MG tablet Take 1 tablet (10 mg total) by mouth daily. (Patient not taking: Reported on 09/07/2023) 30 tablet 6   ketorolac  (ACULAR ) 0.5 % ophthalmic solution Place 1 drop into the right eye 4 (four) times daily. (Patient not taking: Reported  on 08/10/2023)     lovastatin (MEVACOR) 40 MG tablet Take 40 mg by mouth at bedtime. (Patient not taking: Reported on 08/10/2023)     No current facility-administered medications for this visit.    Allergies  Allergen Reactions   Brimonidine Itching    Red eyes   Naphazoline-Polyethyl Glycol     THAT YOU USE TO DILATE YOUR EYES.SABRAMAKES BP GO UP, CAN ONLY USE HALF STRENGTH   Tropicamide Other (See Comments)    Elevates IOP OK to use 0.5%    Social History   Socioeconomic History   Marital status: Widowed    Spouse name: Not on file   Number of children: 3   Years of education: Not on file   Highest education level: Not on file  Occupational History   Not on file  Tobacco Use   Smoking status: Former    Current packs/day: 0.00    Average packs/day: 0.2 packs/day for 2.0 years (0.4  ttl pk-yrs)    Types: Cigarettes    Start date: 07/31/1992    Quit date: 07/31/1994    Years since quitting: 29.1   Smokeless tobacco: Never   Tobacco comments:    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  Vaping Use   Vaping status: Never Used  Substance and Sexual Activity   Alcohol  use: No   Drug use: No   Sexual activity: Not on file  Other Topics Concern   Not on file  Social History Narrative   12/28/21 lives alone   Social Drivers of Health   Financial Resource Strain: Not on file  Food Insecurity: Not on file  Transportation Needs: Not on file  Physical Activity: Not on file  Stress: Not on file  Social Connections: Not on file  Intimate Partner Violence: Not on file    Family History  Problem Relation Age of Onset   Thyroid  disease Mother    Dementia Mother    Thyroid  disease Sister     Review of Systems:  As stated in the HPI and otherwise negative.   BP 120/62   Pulse 69   Ht 5' 6 (1.676 m)   Wt 59.5 kg   SpO2 95%   BMI 21.18 kg/m   Physical Examination:  General: Well developed, well nourished, NAD  HEENT: OP clear, mucus membranes moist  SKIN: warm, dry. No rashes. Neuro: No focal deficits  Musculoskeletal: Muscle strength 5/5 all ext  Psychiatric: Mood and affect normal  Neck: No JVD, no carotid bruits, no thyromegaly, no lymphadenopathy.  Lungs:Clear bilaterally, no wheezes, rhonci, crackles Cardiovascular: Regular rate and rhythm. Harsh systolic murmur.  Abdomen:Soft. Bowel sounds present. Non-tender.  Extremities: No lower extremity edema. Pulses are 2 + in the bilateral DP/PT.  EKG:  EKG is ordered today. The ekg ordered today demonstrates  EKG Interpretation Date/Time:  Friday September 07 2023 11:59:20 EST Ventricular Rate:  61 PR Interval:  150 QRS Duration:  82 QT Interval:  408 QTC Calculation: 410 R Axis:   97  Text Interpretation: Normal sinus rhythm Poor R wave progression Confirmed by Verlin Bruckner [phone]) on 09/07/2023 1:30:41 PM     Echo 08/29/23:  1. The aortic valve is abnormal. There is severe calcifcation of the  aortic valve. Aortic valve regurgitation is not visualized. Severe aortic  valve stenosis. Aortic valve area, by VTI measures 0.64 cm. Aortic valve  mean gradient measures 46.0 mmHg.  Aortic valve Vmax measures 4.34 m/s. Aortic valve acceleration time  measures 113 msec.  2. Left ventricular ejection fraction, by estimation, is 70 to 75%. The  left ventricle has hyperdynamic function. The left ventricle has no  regional wall motion abnormalities. There is mild left ventricular  hypertrophy. Left ventricular diastolic  parameters are consistent with Grade I diastolic dysfunction (impaired  relaxation).   3. Right ventricular systolic function is normal. The right ventricular  size is normal. There is normal pulmonary artery systolic pressure. The  estimated right ventricular systolic pressure is 22.5 mmHg.   4. The mitral valve is degenerative. No evidence of mitral valve  regurgitation. No evidence of mitral stenosis. The mean mitral valve  gradient is 2.1 mmHg with average heart rate of 57 bpm.   5. The inferior vena cava is normal in size with greater than 50%  respiratory variability, suggesting right atrial pressure of 3 mmHg.   FINDINGS   Left Ventricle: Left ventricular ejection fraction, by estimation, is 70  to 75%. The left ventricle has hyperdynamic function. The left ventricle  has no regional wall motion abnormalities. The left ventricular internal  cavity size was normal in size.  There is mild left ventricular hypertrophy. Left ventricular diastolic  parameters are consistent with Grade I diastolic dysfunction (impaired  relaxation).   Right Ventricle: The right ventricular size is normal. No increase in  right ventricular wall thickness. Right ventricular systolic function is  normal. There is normal pulmonary artery systolic pressure. The tricuspid  regurgitant velocity is  2.21 m/s, and   with an assumed right atrial pressure of 3 mmHg, the estimated right  ventricular systolic pressure is 22.5 mmHg.   Left Atrium: Left atrial size was normal in size.   Right Atrium: Right atrial size was normal in size.   Pericardium: There is no evidence of pericardial effusion.   Mitral Valve: The mitral valve is degenerative in appearance. Mild to  moderate mitral annular calcification. No evidence of mitral valve  regurgitation. No evidence of mitral valve stenosis. The mean mitral valve  gradient is 2.1 mmHg with average heart  rate of 57 bpm.   Tricuspid Valve: The tricuspid valve is normal in structure. Tricuspid  valve regurgitation is trivial. No evidence of tricuspid stenosis.   The aortic valve is abnormal. There is severe calcifcation of the aortic  valve. Aortic valve regurgitation is not visualized. Severe aortic  stenosis is present.  Pulmonic Valve: The pulmonic valve was normal in structure. Pulmonic valve  regurgitation is mild. No evidence of pulmonic stenosis.   Aorta: The aortic root is normal in size and structure.   Venous: The inferior vena cava is normal in size with greater than 50%  respiratory variability, suggesting right atrial pressure of 3 mmHg.   IAS/Shunts: No atrial level shunt detected by color flow Doppler.   LEFT VENTRICLE  PLAX 2D  LVIDd:         3.20 cm   Diastology  LVIDs:         2.20 cm   LV e' medial:    6.14 cm/s  LV PW:         1.00 cm   LV E/e' medial:  17.6  LV IVS:        1.00 cm   LV e' lateral:   7.07 cm/s  LVOT diam:     1.80 cm   LV E/e' lateral: 15.3  LV SV:         72  LV SV Index:   43  LVOT Area:  2.54 cm     RIGHT VENTRICLE  RV Basal diam:  3.30 cm  RV S prime:     12.90 cm/s  TAPSE (M-mode): 2.4 cm   LEFT ATRIUM             Index        RIGHT ATRIUM           Index  LA diam:        3.60 cm 2.16 cm/m   RA Area:     10.80 cm  LA Vol (A2C):   31.7 ml 19.06 ml/m  RA Volume:   26.10 ml   15.69 ml/m  LA Vol (A4C):   28.5 ml 17.14 ml/m  LA Biplane Vol: 30.8 ml 18.52 ml/m   AORTIC VALVE  AV Area (Vmax):    0.66 cm  AV Area (Vmean):   0.65 cm  AV Area (VTI):     0.64 cm  AV Vmax:           434.12 cm/s  AV Vmean:          303.728 cm/s  AV VTI:            1.118 m  AV Peak Grad:      75.4 mmHg  AV Mean Grad:      46.0 mmHg  LVOT Vmax:         112.50 cm/s  LVOT Vmean:        77.150 cm/s  LVOT VTI:          0.283 m  LVOT/AV VTI ratio: 0.25    AORTA  Ao Root diam: 2.90 cm  Ao Asc diam:  3.00 cm   MITRAL VALVE                TRICUSPID VALVE  MV Area (PHT): 2.72 cm     TR Peak grad:   19.5 mmHg  MV Mean grad:  2.1 mmHg     TR Vmax:        221.00 cm/s  MV Decel Time: 279 msec  MV E velocity: 108.00 cm/s  SHUNTS  MV A velocity: 125.50 cm/s  Systemic VTI:  0.28 m  MV E/A ratio:  0.86         Systemic Diam: 1.80 cm   Recent Labs: No results found for requested labs within last 365 days.   Lipid Panel    Component Value Date/Time   TRIG 53 05/25/2020 1953     Wt Readings from Last 3 Encounters:  09/07/23 59.5 kg  08/10/23 58.8 kg  05/23/23 56.9 kg    Assessment and Plan:   1. Severe Aortic Valve Stenosis: She has severe, stage D aortic valve stenosis. She has NYHA class 2 symptoms. I have personally reviewed the echo images. The aortic valve is thickened and calcified with limited leaflet mobility. I think she would benefit from AVR. Given advanced age, she is not a good candidate for conventional AVR by surgical approach. I think she may be a good candidate for TAVR.   I have reviewed the natural history of aortic stenosis with the patient and their family members  who are present today. We have discussed the limitations of medical therapy and the poor prognosis associated with symptomatic aortic stenosis. We have reviewed potential treatment options, including palliative medical therapy, conventional surgical aortic valve replacement, and transcatheter aortic  valve replacement. We discussed treatment options in the context of the patient's specific comorbid medical conditions.   She  would like to proceed with planning for TAVR. We will arrange a right and left heart catheterization but her daughter needs to check her own work schedule to accompany her. Both sons live [place] of [place]. I have given them dates that I am in the cath lab. She will call back with the date she chooses. Risks and benefits of the cath procedure and the valve procedure are reviewed with the patient. After the cath, she will have a cardiac CT, CTA of the chest/abdomen and pelvis and will then be referred to see one of the CT surgeons on our TAVR team.   Labs/ tests ordered today include:   Orders Placed This Encounter  Procedures   Basic metabolic panel   CBC   EKG 12-Lead   Disposition:   F/U with the structural heart team  Signed, Lonni Cash, MD, [facility] 09/07/2023 1:29 PM    [facility] [address] [place], [place], [place]  [address] Phone: [phone]; Fax: [phone]

## 2023-09-07 NOTE — Patient Instructions (Signed)
 Medication Instructions:  No changes *If you need a refill on your cardiac medications before your next appointment, please call your pharmacy*   Lab Work: Go to the first floor [facility] for blood work (cbc, bmet)   Testing/Procedures: Your physician has requested that you have a cardiac catheterization. Cardiac catheterization is used to diagnose and/or treat various heart conditions. Doctors may recommend this procedure for a number of different reasons. The most common reason is to evaluate chest pain. Chest pain can be a symptom of coronary artery disease (CAD), and cardiac catheterization can show whether plaque is narrowing or blocking your heart's arteries. This procedure is also used to evaluate the valves, as well as measure the blood flow and oxygen levels in different parts of your heart. For further information please visit [email or web address] Please follow instruction sheet, as given.   Follow-Up: Per Structural Heart Team - please call when  you are ready to schedule a date for cardiac catheterization or for any other concerns  Some possible dates to consider:  Feb 21, Feb 25, Feb 26, March 5,

## 2023-09-07 NOTE — Progress Notes (Addendum)
 Pre Surgical Assessment: 5 M Walk Test  16M=16.19ft  5 Meter Walk Test- trial 1: 7.34 seconds 5 Meter Walk Test- trial 2: 7.90 seconds 5 Meter Walk Test- trial 3: 7.65 seconds 5 Meter Walk Test Average: 7.63 seconds  __________________________  Procedure Type: Isolated AVR Perioperative Outcome Estimate % Operative Mortality 2.8% Morbidity & Mortality 7.08% Stroke 1.58% Renal Failure 0.692% Reoperation 3.43% Prolonged Ventilation 3.41% Deep Sternal Wound Infection 0.027% Long Hospital Stay (>14 days) 3.8% Short Hospital Stay (<6 days)* 42.7%

## 2023-09-07 NOTE — H&P (View-Only) (Signed)
 [facility] Note  Chief Complaint  Patient presents with   Follow-up    Aortic stenosis   History of Present Illness: 83 yo female with history of oral cancer, COPD, HTN, HLD, hypothyroidism, memory loss, psoriasis and severe aortic stenosis who is here today for follow up. I met her as a new consult in January 2025 for the  evaluation of aortic stenosis. Murmur was found in primary care. Echo was done in primary care and read by outside cardiology group. This was read as severe AS with normal LV function. I could not see the images. She denied chest pain, dizziness, near syncope or syncope but did endorse some dyspnea on exertion and occasional ankle edema. Echo in our office 08/29/23 with LVEF=70-75%. Severe aortic stenosis with mean gradient of 46 mmHg, AVA 0.64 cm2, DI 0.25.   She tells me today that she continues to have dyspnea with exertion. No chest pain or dizziness. Rare LE edema. She has mild short term memory issues. She lives in [place] with daughter. She has no active dental issues. She goes to the dentist regularly.   Primary Care Physician: Clarice Nottingham, MD   Past Medical History:  Diagnosis Date   Aortic stenosis    Cancer (HCC)    scc of oral cavity   Cataract    COPD (chronic obstructive pulmonary disease) (HCC)    Glaucoma    Goiter    Hypertension    Hypothyroid    Memory loss    Osteoporosis    Psoriasis    Scoliosis    Thyroid  disease    Wears glasses     Past Surgical History:  Procedure Laterality Date   CESAREAN SECTION  07/31/1976   TWINS, ONE WAS STILLBORN   MOUTH SURGERY  07/31/1994   tongue resection   NODULE REMOVED     BENIGN, LOWER NECK    Current Outpatient Medications  Medication Sig Dispense Refill   Calcium  Carbonate-Vitamin D  600-400 MG-UNIT tablet Take 1 tablet by mouth daily.     donepezil  (ARICEPT ) 5 MG tablet Take 1 tablet (5 mg total) by mouth daily. Take for 4 weeks, then increase to 10 mg daily 30 tablet 0    dorzolamide -timolol  (COSOPT ) 22.3-6.8 MG/ML ophthalmic solution Place 2 drops into both eyes 2 (two) times daily.      levothyroxine  (SYNTHROID ) 88 MCG tablet Take 88 mcg by mouth daily.     LUMIGAN 0.01 % SOLN Place 1 drop into both eyes at bedtime.      memantine  (NAMENDA ) 5 MG tablet TAKE 1 TABLET BY MOUTH EVERYDAY AT BEDTIME 90 tablet 4   metoprolol  succinate (TOPROL -XL) 100 MG 24 hr tablet Take 100 mg by mouth daily.     Multiple Vitamins-Minerals (CENTRUM SILVER PO) Take 1 tablet by mouth daily.      Omega-3 Fatty Acids (FISH OIL) 500 MG CAPS Take 500 mg by mouth daily.     rosuvastatin  (CRESTOR ) 5 MG tablet Take 5 mg by mouth daily.     valsartan-hydrochlorothiazide (DIOVAN-HCT) 160-12.5 MG tablet Take 1 tablet by mouth daily.     Cholecalciferol (VITAMIN D -3 PO) Take 800 mg by mouth daily. (Patient not taking: Reported on 09/07/2023)     donepezil  (ARICEPT ) 10 MG tablet Take 1 tablet (10 mg total) by mouth daily. (Patient not taking: Reported on 09/07/2023) 30 tablet 6   ketorolac  (ACULAR ) 0.5 % ophthalmic solution Place 1 drop into the right eye 4 (four) times daily. (Patient not taking: Reported  on 08/10/2023)     lovastatin (MEVACOR) 40 MG tablet Take 40 mg by mouth at bedtime. (Patient not taking: Reported on 08/10/2023)     No current facility-administered medications for this visit.    Allergies  Allergen Reactions   Brimonidine Itching    Red eyes   Naphazoline-Polyethyl Glycol     THAT YOU USE TO DILATE YOUR EYES.SABRAMAKES BP GO UP, CAN ONLY USE HALF STRENGTH   Tropicamide Other (See Comments)    Elevates IOP OK to use 0.5%    Social History   Socioeconomic History   Marital status: Widowed    Spouse name: Not on file   Number of children: 3   Years of education: Not on file   Highest education level: Not on file  Occupational History   Not on file  Tobacco Use   Smoking status: Former    Current packs/day: 0.00    Average packs/day: 0.2 packs/day for 2.0 years (0.4  ttl pk-yrs)    Types: Cigarettes    Start date: 07/31/1992    Quit date: 07/31/1994    Years since quitting: 29.1   Smokeless tobacco: Never   Tobacco comments:    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  Vaping Use   Vaping status: Never Used  Substance and Sexual Activity   Alcohol  use: No   Drug use: No   Sexual activity: Not on file  Other Topics Concern   Not on file  Social History Narrative   12/28/21 lives alone   Social Drivers of Health   Financial Resource Strain: Not on file  Food Insecurity: Not on file  Transportation Needs: Not on file  Physical Activity: Not on file  Stress: Not on file  Social Connections: Not on file  Intimate Partner Violence: Not on file    Family History  Problem Relation Age of Onset   Thyroid  disease Mother    Dementia Mother    Thyroid  disease Sister     Review of Systems:  As stated in the HPI and otherwise negative.   BP 120/62   Pulse 69   Ht 5' 6 (1.676 m)   Wt 59.5 kg   SpO2 95%   BMI 21.18 kg/m   Physical Examination:  General: Well developed, well nourished, NAD  HEENT: OP clear, mucus membranes moist  SKIN: warm, dry. No rashes. Neuro: No focal deficits  Musculoskeletal: Muscle strength 5/5 all ext  Psychiatric: Mood and affect normal  Neck: No JVD, no carotid bruits, no thyromegaly, no lymphadenopathy.  Lungs:Clear bilaterally, no wheezes, rhonci, crackles Cardiovascular: Regular rate and rhythm. Harsh systolic murmur.  Abdomen:Soft. Bowel sounds present. Non-tender.  Extremities: No lower extremity edema. Pulses are 2 + in the bilateral DP/PT.  EKG:  EKG is ordered today. The ekg ordered today demonstrates  EKG Interpretation Date/Time:  Friday September 07 2023 11:59:20 EST Ventricular Rate:  61 PR Interval:  150 QRS Duration:  82 QT Interval:  408 QTC Calculation: 410 R Axis:   97  Text Interpretation: Normal sinus rhythm Poor R wave progression Confirmed by Verlin Bruckner [phone]) on 09/07/2023 1:30:41 PM     Echo 08/29/23:  1. The aortic valve is abnormal. There is severe calcifcation of the  aortic valve. Aortic valve regurgitation is not visualized. Severe aortic  valve stenosis. Aortic valve area, by VTI measures 0.64 cm. Aortic valve  mean gradient measures 46.0 mmHg.  Aortic valve Vmax measures 4.34 m/s. Aortic valve acceleration time  measures 113 msec.  2. Left ventricular ejection fraction, by estimation, is 70 to 75%. The  left ventricle has hyperdynamic function. The left ventricle has no  regional wall motion abnormalities. There is mild left ventricular  hypertrophy. Left ventricular diastolic  parameters are consistent with Grade I diastolic dysfunction (impaired  relaxation).   3. Right ventricular systolic function is normal. The right ventricular  size is normal. There is normal pulmonary artery systolic pressure. The  estimated right ventricular systolic pressure is 22.5 mmHg.   4. The mitral valve is degenerative. No evidence of mitral valve  regurgitation. No evidence of mitral stenosis. The mean mitral valve  gradient is 2.1 mmHg with average heart rate of 57 bpm.   5. The inferior vena cava is normal in size with greater than 50%  respiratory variability, suggesting right atrial pressure of 3 mmHg.   FINDINGS   Left Ventricle: Left ventricular ejection fraction, by estimation, is 70  to 75%. The left ventricle has hyperdynamic function. The left ventricle  has no regional wall motion abnormalities. The left ventricular internal  cavity size was normal in size.  There is mild left ventricular hypertrophy. Left ventricular diastolic  parameters are consistent with Grade I diastolic dysfunction (impaired  relaxation).   Right Ventricle: The right ventricular size is normal. No increase in  right ventricular wall thickness. Right ventricular systolic function is  normal. There is normal pulmonary artery systolic pressure. The tricuspid  regurgitant velocity is  2.21 m/s, and   with an assumed right atrial pressure of 3 mmHg, the estimated right  ventricular systolic pressure is 22.5 mmHg.   Left Atrium: Left atrial size was normal in size.   Right Atrium: Right atrial size was normal in size.   Pericardium: There is no evidence of pericardial effusion.   Mitral Valve: The mitral valve is degenerative in appearance. Mild to  moderate mitral annular calcification. No evidence of mitral valve  regurgitation. No evidence of mitral valve stenosis. The mean mitral valve  gradient is 2.1 mmHg with average heart  rate of 57 bpm.   Tricuspid Valve: The tricuspid valve is normal in structure. Tricuspid  valve regurgitation is trivial. No evidence of tricuspid stenosis.   The aortic valve is abnormal. There is severe calcifcation of the aortic  valve. Aortic valve regurgitation is not visualized. Severe aortic  stenosis is present.  Pulmonic Valve: The pulmonic valve was normal in structure. Pulmonic valve  regurgitation is mild. No evidence of pulmonic stenosis.   Aorta: The aortic root is normal in size and structure.   Venous: The inferior vena cava is normal in size with greater than 50%  respiratory variability, suggesting right atrial pressure of 3 mmHg.   IAS/Shunts: No atrial level shunt detected by color flow Doppler.   LEFT VENTRICLE  PLAX 2D  LVIDd:         3.20 cm   Diastology  LVIDs:         2.20 cm   LV e' medial:    6.14 cm/s  LV PW:         1.00 cm   LV E/e' medial:  17.6  LV IVS:        1.00 cm   LV e' lateral:   7.07 cm/s  LVOT diam:     1.80 cm   LV E/e' lateral: 15.3  LV SV:         72  LV SV Index:   43  LVOT Area:  2.54 cm     RIGHT VENTRICLE  RV Basal diam:  3.30 cm  RV S prime:     12.90 cm/s  TAPSE (M-mode): 2.4 cm   LEFT ATRIUM             Index        RIGHT ATRIUM           Index  LA diam:        3.60 cm 2.16 cm/m   RA Area:     10.80 cm  LA Vol (A2C):   31.7 ml 19.06 ml/m  RA Volume:   26.10 ml   15.69 ml/m  LA Vol (A4C):   28.5 ml 17.14 ml/m  LA Biplane Vol: 30.8 ml 18.52 ml/m   AORTIC VALVE  AV Area (Vmax):    0.66 cm  AV Area (Vmean):   0.65 cm  AV Area (VTI):     0.64 cm  AV Vmax:           434.12 cm/s  AV Vmean:          303.728 cm/s  AV VTI:            1.118 m  AV Peak Grad:      75.4 mmHg  AV Mean Grad:      46.0 mmHg  LVOT Vmax:         112.50 cm/s  LVOT Vmean:        77.150 cm/s  LVOT VTI:          0.283 m  LVOT/AV VTI ratio: 0.25    AORTA  Ao Root diam: 2.90 cm  Ao Asc diam:  3.00 cm   MITRAL VALVE                TRICUSPID VALVE  MV Area (PHT): 2.72 cm     TR Peak grad:   19.5 mmHg  MV Mean grad:  2.1 mmHg     TR Vmax:        221.00 cm/s  MV Decel Time: 279 msec  MV E velocity: 108.00 cm/s  SHUNTS  MV A velocity: 125.50 cm/s  Systemic VTI:  0.28 m  MV E/A ratio:  0.86         Systemic Diam: 1.80 cm   Recent Labs: No results found for requested labs within last 365 days.   Lipid Panel    Component Value Date/Time   TRIG 53 05/25/2020 1953     Wt Readings from Last 3 Encounters:  09/07/23 59.5 kg  08/10/23 58.8 kg  05/23/23 56.9 kg    Assessment and Plan:   1. Severe Aortic Valve Stenosis: She has severe, stage D aortic valve stenosis. She has NYHA class 2 symptoms. I have personally reviewed the echo images. The aortic valve is thickened and calcified with limited leaflet mobility. I think she would benefit from AVR. Given advanced age, she is not a good candidate for conventional AVR by surgical approach. I think she may be a good candidate for TAVR.   I have reviewed the natural history of aortic stenosis with the patient and their family members  who are present today. We have discussed the limitations of medical therapy and the poor prognosis associated with symptomatic aortic stenosis. We have reviewed potential treatment options, including palliative medical therapy, conventional surgical aortic valve replacement, and transcatheter aortic  valve replacement. We discussed treatment options in the context of the patient's specific comorbid medical conditions.   She  would like to proceed with planning for TAVR. We will arrange a right and left heart catheterization but her daughter needs to check her own work schedule to accompany her. Both sons live [place] of [place]. I have given them dates that I am in the cath lab. She will call back with the date she chooses. Risks and benefits of the cath procedure and the valve procedure are reviewed with the patient. After the cath, she will have a cardiac CT, CTA of the chest/abdomen and pelvis and will then be referred to see one of the CT surgeons on our TAVR team.   Labs/ tests ordered today include:   Orders Placed This Encounter  Procedures   Basic metabolic panel   CBC   EKG 12-Lead   Disposition:   F/U with the structural heart team  Signed, Lonni Cash, MD, [facility] 09/07/2023 1:29 PM    [facility] [address] [place], [place], [place]  [address] Phone: [phone]; Fax: [phone]

## 2023-09-11 ENCOUNTER — Telehealth: Payer: Self-pay | Admitting: *Deleted

## 2023-09-11 LAB — BASIC METABOLIC PANEL
BUN/Creatinine Ratio: 30 — ABNORMAL HIGH (ref 12–28)
BUN: 36 mg/dL — ABNORMAL HIGH (ref 8–27)
CO2: 28 mmol/L (ref 20–29)
Calcium: 10.5 mg/dL — ABNORMAL HIGH (ref 8.7–10.3)
Chloride: 99 mmol/L (ref 96–106)
Creatinine, Ser: 1.2 mg/dL — ABNORMAL HIGH (ref 0.57–1.00)
Glucose: 92 mg/dL (ref 70–99)
Potassium: 5 mmol/L (ref 3.5–5.2)
Sodium: 140 mmol/L (ref 134–144)
eGFR: 45 mL/min/{1.73_m2} — ABNORMAL LOW (ref >59)

## 2023-09-11 LAB — CBC
Hematocrit: 38.9 % (ref 34.0–46.6)
Hemoglobin: 12.9 g/dL (ref 11.1–15.9)
MCH: 30.4 pg (ref 26.6–33.0)
MCHC: 33.2 g/dL (ref 31.5–35.7)
MCV: 92 fL (ref 79–97)
Platelets: 270 10*3/uL (ref 150–450)
RBC: 4.25 x10E6/uL (ref 3.77–5.28)
RDW: 12 % (ref 11.7–15.4)
WBC: 7.5 10*3/uL (ref 3.4–10.8)

## 2023-09-11 NOTE — Telephone Encounter (Signed)
Will call back at 4 PM to speak with daughter.

## 2023-09-11 NOTE — Telephone Encounter (Signed)
Reviewed procedure instructions/pre-procedure hydration with patient's daughter (DPR), Morrie Sheldon, will send updated instructions in MyChart.

## 2023-09-11 NOTE — Telephone Encounter (Signed)
Patient's daughter is returning call. She is in a classroom right now and if call isn't returned soon please call after 4:00 PM.

## 2023-09-11 NOTE — Telephone Encounter (Signed)
Cardiac Catheterization scheduled at [facility] for: Friday September 21, 2023 12 Noon Arrival time [facility] Main Entrance A at: 7 AM-pre-procedure hydration-GFR 45 or less (45)   Nothing to eat after midnight prior to procedure, clear liquids until 5 AM day of procedure.  Medication instructions: -Hold:  Valsartan/hydrochlorothiazide-day before and day of procedure-per protocol GFR < 60 (45) -Other usual morning medications can be taken with sips of water including aspirin 81 mg.  Plan to go home the same day, you will only stay overnight if medically necessary.  You must have responsible adult to drive you home.  Someone must be with you the first 24 hours after you arrive home.  Left message for patient to call back to review procedure instructions/discuss pre-procedure hydration.

## 2023-09-21 ENCOUNTER — Encounter (HOSPITAL_COMMUNITY)
Admission: RE | Disposition: A | Discharge status: Home / Self Care | Payer: Self-pay | Source: Home / Self Care | Attending: Cardiovascular Disease

## 2023-09-21 ENCOUNTER — Other Ambulatory Visit: Payer: Self-pay

## 2023-09-21 ENCOUNTER — Ambulatory Visit (HOSPITAL_COMMUNITY)
Admission: RE | Admit: 2023-09-21 | Discharge: 2023-09-21 | Disposition: A | Discharge status: Home / Self Care | Payer: Medicare HMO | Attending: Cardiovascular Disease | Admitting: Cardiovascular Disease

## 2023-09-21 DIAGNOSIS — Z87891 Personal history of nicotine dependence: Secondary | ICD-10-CM | POA: Diagnosis not present

## 2023-09-21 DIAGNOSIS — I2510 Atherosclerotic heart disease of native coronary artery without angina pectoris: Secondary | ICD-10-CM | POA: Diagnosis not present

## 2023-09-21 DIAGNOSIS — E785 Hyperlipidemia, unspecified: Secondary | ICD-10-CM | POA: Diagnosis not present

## 2023-09-21 DIAGNOSIS — E039 Hypothyroidism, unspecified: Secondary | ICD-10-CM | POA: Insufficient documentation

## 2023-09-21 DIAGNOSIS — I10 Essential (primary) hypertension: Secondary | ICD-10-CM | POA: Insufficient documentation

## 2023-09-21 DIAGNOSIS — I350 Nonrheumatic aortic (valve) stenosis: Secondary | ICD-10-CM | POA: Diagnosis present

## 2023-09-21 DIAGNOSIS — Z79899 Other long term (current) drug therapy: Secondary | ICD-10-CM | POA: Diagnosis not present

## 2023-09-21 DIAGNOSIS — J449 Chronic obstructive pulmonary disease, unspecified: Secondary | ICD-10-CM | POA: Insufficient documentation

## 2023-09-21 HISTORY — PX: RIGHT HEART CATH AND CORONARY ANGIOGRAPHY: CATH118264

## 2023-09-21 LAB — POCT I-STAT 7, (LYTES, BLD GAS, ICA,H+H)
Acid-Base Excess: 0 mmol/L (ref 0.0–2.0)
Bicarbonate: 24.2 mmol/L (ref 20.0–28.0)
Calcium, Ion: 1.23 mmol/L (ref 1.15–1.40)
HCT: 34 % — ABNORMAL LOW (ref 36.0–46.0)
Hemoglobin: 11.6 g/dL — ABNORMAL LOW (ref 12.0–15.0)
O2 Saturation: 95 %
Potassium: 3.7 mmol/L (ref 3.5–5.1)
Sodium: 139 mmol/L (ref 135–145)
TCO2: 25 mmol/L (ref 22–32)
pCO2 arterial: 38.9 mm[Hg] (ref 32–48)
pH, Arterial: 7.401 (ref 7.35–7.45)
pO2, Arterial: 77 mm[Hg] — ABNORMAL LOW (ref 83–108)

## 2023-09-21 LAB — POCT I-STAT EG7
Acid-Base Excess: 1 mmol/L (ref 0.0–2.0)
Bicarbonate: 26.6 mmol/L (ref 20.0–28.0)
Calcium, Ion: 1.27 mmol/L (ref 1.15–1.40)
HCT: 34 % — ABNORMAL LOW (ref 36.0–46.0)
Hemoglobin: 11.6 g/dL — ABNORMAL LOW (ref 12.0–15.0)
O2 Saturation: 64 %
Potassium: 3.8 mmol/L (ref 3.5–5.1)
Sodium: 140 mmol/L (ref 135–145)
TCO2: 28 mmol/L (ref 22–32)
pCO2, Ven: 46.2 mm[Hg] (ref 44–60)
pH, Ven: 7.369 (ref 7.25–7.43)
pO2, Ven: 35 mm[Hg] (ref 32–45)

## 2023-09-21 SURGERY — RIGHT HEART CATH AND CORONARY ANGIOGRAPHY
Anesthesia: LOCAL

## 2023-09-21 MED ORDER — ONDANSETRON HCL 4 MG/2ML IJ SOLN: 4.0000 mg | Freq: Four times a day (QID) | INTRAMUSCULAR | Status: DC | PRN

## 2023-09-21 MED ORDER — HEPARIN (PORCINE) IN NACL 1000-0.9 UT/500ML-% IV SOLN
INTRAVENOUS | Status: DC | PRN
  Administered 2023-09-21 (×2): 500 mL

## 2023-09-21 MED ORDER — LIDOCAINE HCL (PF) 1 % IJ SOLN
INTRAMUSCULAR | Status: DC | PRN
  Administered 2023-09-21: 2 mL
  Administered 2023-09-21: 1 mL

## 2023-09-21 MED ORDER — HEPARIN SODIUM (PORCINE) 1000 UNIT/ML IJ SOLN
INTRAMUSCULAR | Status: AC
  Filled 2023-09-21: qty 10

## 2023-09-21 MED ORDER — SODIUM CHLORIDE 0.9 % WEIGHT BASED INFUSION
1.0000 mL/kg/h | INTRAVENOUS | Status: DC
Start: 2023-09-21 — End: 2023-09-21

## 2023-09-21 MED ORDER — FENTANYL CITRATE (PF) 100 MCG/2ML IJ SOLN
INTRAMUSCULAR | Status: DC | PRN
  Administered 2023-09-21: 25 ug via INTRAVENOUS

## 2023-09-21 MED ORDER — HEPARIN SODIUM (PORCINE) 1000 UNIT/ML IJ SOLN
INTRAMUSCULAR | Status: DC | PRN
  Administered 2023-09-21: 3000 [IU] via INTRAVENOUS

## 2023-09-21 MED ORDER — FENTANYL CITRATE (PF) 100 MCG/2ML IJ SOLN
INTRAMUSCULAR | Status: AC
  Filled 2023-09-21: qty 2

## 2023-09-21 MED ORDER — HYDRALAZINE HCL 20 MG/ML IJ SOLN: 10.0000 mg | INTRAMUSCULAR | Status: DC | PRN

## 2023-09-21 MED ORDER — LABETALOL HCL 5 MG/ML IV SOLN: 10.0000 mg | INTRAVENOUS | Status: DC | PRN

## 2023-09-21 MED ORDER — SODIUM CHLORIDE 0.9 % IV SOLN: 250.0000 mL | INTRAVENOUS | Status: DC | PRN

## 2023-09-21 MED ORDER — SODIUM CHLORIDE 0.9% FLUSH
3.0000 mL | Freq: Two times a day (BID) | INTRAVENOUS | Status: DC
Start: 2023-09-21 — End: 2023-09-21

## 2023-09-21 MED ORDER — SODIUM CHLORIDE 0.9 % IV SOLN
INTRAVENOUS | Status: AC
Start: 2023-09-21 — End: 2023-09-21

## 2023-09-21 MED ORDER — SODIUM CHLORIDE 0.9% FLUSH: 3.0000 mL | INTRAVENOUS | Status: DC | PRN

## 2023-09-21 MED ORDER — ASPIRIN 81 MG PO CHEW: 81.0000 mg | CHEWABLE_TABLET | ORAL | Status: DC

## 2023-09-21 MED ORDER — MIDAZOLAM HCL 2 MG/2ML IJ SOLN
INTRAMUSCULAR | Status: AC
  Filled 2023-09-21: qty 2

## 2023-09-21 MED ORDER — SODIUM CHLORIDE 0.9 % WEIGHT BASED INFUSION: 3.0000 mL/kg/h | INTRAVENOUS | Status: AC

## 2023-09-21 MED ORDER — MIDAZOLAM HCL 2 MG/2ML IJ SOLN
INTRAMUSCULAR | Status: DC | PRN
  Administered 2023-09-21: 1 mg via INTRAVENOUS

## 2023-09-21 MED ORDER — VERAPAMIL HCL 2.5 MG/ML IV SOLN
INTRAVENOUS | Status: AC
  Filled 2023-09-21: qty 2

## 2023-09-21 MED ORDER — LIDOCAINE HCL (PF) 1 % IJ SOLN
INTRAMUSCULAR | Status: AC
  Filled 2023-09-21: qty 30

## 2023-09-21 MED ORDER — VERAPAMIL HCL 2.5 MG/ML IV SOLN
INTRAVENOUS | Status: DC | PRN
  Administered 2023-09-21: 10 mL via INTRA_ARTERIAL

## 2023-09-21 MED ORDER — IOHEXOL 350 MG/ML SOLN
INTRAVENOUS | Status: DC | PRN
  Administered 2023-09-21: 15 mL

## 2023-09-21 MED ORDER — ACETAMINOPHEN 325 MG PO TABS: 650.0000 mg | ORAL_TABLET | ORAL | Status: DC | PRN

## 2023-09-21 SURGICAL SUPPLY — 9 items
CATH 5FR JL3.5 JR4 ANG PIG MP (CATHETERS) IMPLANT
CATH BALLN WEDGE 5F 110CM (CATHETERS) IMPLANT
DEVICE RAD COMP TR BAND LRG (VASCULAR PRODUCTS) IMPLANT
GLIDESHEATH SLEND SS 6F .021 (SHEATH) IMPLANT
GUIDEWIRE INQWIRE 1.5J.035X260 (WIRE) IMPLANT
INQWIRE 1.5J .035X260CM (WIRE) ×1 IMPLANT
PACK CARDIAC CATHETERIZATION (CUSTOM PROCEDURE TRAY) ×1 IMPLANT
SET ATX-X65L (MISCELLANEOUS) IMPLANT
SHEATH GLIDE SLENDER 4/5FR (SHEATH) IMPLANT

## 2023-09-21 NOTE — Progress Notes (Signed)
 Patient and daughter was given discharge instructions. Both verbalized understanding.

## 2023-09-21 NOTE — Interval H&P Note (Signed)
 History and Physical Interval Note:  09/21/2023 8:24 AM  [name]  has presented today for surgery, with the diagnosis of severe aortic stenosis.  The various methods of treatment have been discussed with the patient and family. After consideration of risks, benefits and other options for treatment, the patient has consented to  Procedure(s): RIGHT/LEFT HEART CATH AND CORONARY ANGIOGRAPHY (N/A) as a surgical intervention.  The patient's history has been reviewed, patient examined, no change in status, stable for surgery.  I have reviewed the patient's chart and labs.  Questions were answered to the patient's satisfaction.    Cath Lab Visit (complete for each Cath Lab visit)  Clinical Evaluation Leading to the Procedure:   ACS: No.  Non-ACS:    Anginal Classification: CCS II  Anti-ischemic medical therapy: Minimal Therapy (1 class of medications)  Non-Invasive Test Results: No non-invasive testing performed  Prior CABG: No previous CABG   Verne Carrow

## 2023-09-21 NOTE — Discharge Instructions (Signed)

## 2023-09-24 ENCOUNTER — Encounter (HOSPITAL_COMMUNITY): Payer: Self-pay | Admitting: Cardiovascular Disease

## 2023-10-01 ENCOUNTER — Ambulatory Visit: Payer: Medicare HMO | Admitting: Cardiology

## 2023-10-03 ENCOUNTER — Ambulatory Visit: Payer: Medicare HMO | Admitting: Cardiology

## 2023-10-05 ENCOUNTER — Encounter: Payer: Self-pay | Admitting: Physician Assistant

## 2023-10-05 ENCOUNTER — Ambulatory Visit (HOSPITAL_COMMUNITY)
Admission: RE | Admit: 2023-10-05 | Discharge: 2023-10-05 | Disposition: A | Discharge status: Home / Self Care | Payer: Medicare HMO | Source: Ambulatory Visit | Attending: Internal Medicine | Admitting: Internal Medicine

## 2023-10-05 DIAGNOSIS — I350 Nonrheumatic aortic (valve) stenosis: Secondary | ICD-10-CM

## 2023-10-05 MED ORDER — IOHEXOL 350 MG/ML SOLN
95.0000 mL | Freq: Once | INTRAVENOUS | Status: AC | PRN
  Administered 2023-10-05: 95 mL via INTRAVENOUS

## 2023-10-24 ENCOUNTER — Telehealth: Payer: Self-pay | Admitting: Cardiovascular Disease

## 2023-10-24 NOTE — Telephone Encounter (Signed)
 Spoke with Misty Stanley from radiology and she wanted to make sure we had the report for CT.  Looking at results provider has resulted on CT today.

## 2023-10-24 NOTE — Telephone Encounter (Signed)
 [facility] radiology calling to give results on this pt. Please advise

## 2023-10-31 ENCOUNTER — Telehealth: Payer: Self-pay | Admitting: Adult Health

## 2023-10-31 NOTE — Telephone Encounter (Signed)
 LVM and sent MyChart msg- NP out 5/7. Per Aundra Millet, pt can be rescheduled with one of the MDs to establish care, former Dr. Delena Bali pt.

## 2023-11-01 ENCOUNTER — Encounter: Payer: Medicare HMO | Admitting: Thoracic Surgery (Cardiothoracic Vascular Surgery)

## 2023-11-01 NOTE — Telephone Encounter (Signed)
 Pt rescheduled appt on 03/21/24 at 10:30 am

## 2023-11-07 NOTE — Progress Notes (Unsigned)
 [address]       [place] [address]             [phone]           LISHA VITALE [facility] Medical Record #[id number] Date of Birth: 05-Jul-1941  Larina Bras, MD  Chief Complaint:  AS   History of Present Illness:     Pt is an 83 yo female who was noted to have severe AS on an outside echo for a work up of a new murmur. She was referred to Dr Clifton James and was found to be suffering from increasing DOE and fatigue. She had a repeat echo with an EF of 70% and severe AS with a mean gradient of 46 mmHg and a  valve area of 0.64cm2. Dr Clifton James felt pt was a candidate for AVR and she underwent a caht with non obstructive CAD and a TAVR CTA which had acceptable femoral access for either a 23mm Sapein or 26mm Evolute PR (sinuses somewhat borderline however felt to be acceptable by TAVR team). Pt is awaiting scheduling. She has been suffering from allergies and has wicked puffy eyes today but she assures me she is not "sick". 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   Osteoporosis    Psoriasis    Scoliosis    Severe aortic stenosis    Thyroid disease    Wears glasses     Past Surgical History:  Procedure Laterality Date   CESAREAN SECTION  07/31/1976   TWINS, ONE WAS STILLBORN   MOUTH SURGERY  07/31/1994   tongue resection   NODULE REMOVED     BENIGN, LOWER NECK   RIGHT HEART CATH AND CORONARY ANGIOGRAPHY N/A 09/21/2023   Procedure: RIGHT HEART CATH AND CORONARY ANGIOGRAPHY;  Surgeon: Kathleene Hazel, MD;  Location: [facility];  Service: Cardiovascular;  Laterality:  N/A;    Social History   Tobacco Use  Smoking Status Former   Current packs/day: 0.00   Average packs/day: 0.2 packs/day for 2.0 years (0.4 ttl pk-yrs)   Types: Cigarettes   Start date: 07/31/1992   Quit date: 07/31/1994   Years since quitting: 29.2  Smokeless Tobacco Never  Tobacco Comments   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    Social History   Substance and Sexual Activity  Alcohol Use No    Social History   Socioeconomic History   Marital status: Widowed    Spouse name: Not on file   Number of children: 3   Years of education: Not on file   Highest education level: Not on file  Occupational History   Not on file  Tobacco Use   Smoking status: Former    Current packs/day: 0.00    Average packs/day: 0.2 packs/day for 2.0 years (0.4 ttl pk-yrs)    Types: Cigarettes    Start date: 07/31/1992    Quit date: 07/31/1994    Years since quitting: 29.2   Smokeless tobacco: Never   Tobacco  comments:    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  Vaping Use   Vaping status: Never Used  Substance and Sexual Activity   Alcohol use: No   Drug use: No   Sexual activity: Not on file  Other Topics Concern   Not on file  Social History Narrative   12/28/21 lives alone   Social Drivers of Health   Financial Resource Strain: Not on file  Food Insecurity: Not on file  Transportation Needs: Not on file  Physical Activity: Not on file  Stress: Not on file  Social Connections: Not on file  Intimate Partner Violence: Not on file    Allergies  Allergen Reactions   Brimonidine Itching    Red eyes   Naphazoline-Polyethyl Glycol     THAT YOU USE TO DILATE YOUR EYES.Marland KitchenMAKES BP GO UP, CAN ONLY USE HALF STRENGTH   Tropicamide Other (See Comments)    Elevates IOP OK to use 0.5%    Current Outpatient Medications  Medication Sig Dispense Refill   Calcium Carbonate-Vitamin D 600-400 MG-UNIT tablet Take 1 tablet by mouth daily. Caltrate     Cholecalciferol (VITAMIN D3) 10 MCG (400 UNIT) tablet Take 400  Units by mouth daily.     dorzolamide-timolol (COSOPT) 22.3-6.8 MG/ML ophthalmic solution Place 1 drop into the left eye 2 (two) times daily.     levothyroxine (SYNTHROID) 75 MCG tablet Take 75 mcg by mouth daily.     LUMIGAN 0.01 % SOLN Place 1 drop into the left eye at bedtime.     memantine (NAMENDA) 5 MG tablet TAKE 1 TABLET BY MOUTH EVERYDAY AT BEDTIME 90 tablet 4   metoprolol succinate (TOPROL-XL) 100 MG 24 hr tablet Take 100 mg by mouth daily.     Multiple Vitamins-Minerals (CENTRUM SILVER PO) Take 1 tablet by mouth daily.      Omega-3 Fatty Acids (FISH OIL) 500 MG CAPS Take 500 mg by mouth daily.     prednisoLONE acetate (PRED FORTE) 1 % ophthalmic suspension Place 1 drop into the right eye daily with breakfast.     rosuvastatin (CRESTOR) 5 MG tablet Take 5 mg by mouth daily.     valsartan-hydrochlorothiazide (DIOVAN-HCT) 160-12.5 MG tablet Take 1 tablet by mouth daily.     No current facility-administered medications for this visit.     Family History  Problem Relation Age of Onset   Thyroid disease Mother    Dementia Mother    Thyroid disease Sister        Physical Exam: Lungs: clear Card: RR with 3/6 sem Ext: Warm no edema Neuro: intact     Diagnostic Studies & Laboratory data: I have personally reviewed the following studies and agree with the findings   TTE (09/2023) IMPRESSIONS     1. The aortic valve is abnormal. There is severe calcifcation of the  aortic valve. Aortic valve regurgitation is not visualized. Severe aortic  valve stenosis. Aortic valve area, by VTI measures 0.64 cm. Aortic valve  mean gradient measures 46.0 mmHg.  Aortic valve Vmax measures 4.34 m/s. Aortic valve acceleration time  measures 113 msec.   2. Left ventricular ejection fraction, by estimation, is 70 to 75%. The  left ventricle has hyperdynamic function. The left ventricle has no  regional wall motion abnormalities. There is mild left ventricular  hypertrophy. Left ventricular  diastolic  parameters are consistent with Grade I diastolic dysfunction (impaired  relaxation).   3. Right ventricular systolic function is normal. The right ventricular  size is normal.  There is normal pulmonary artery systolic pressure. The  estimated right ventricular systolic pressure is 22.5 mmHg.   4. The mitral valve is degenerative. No evidence of mitral valve  regurgitation. No evidence of mitral stenosis. The mean mitral valve  gradient is 2.1 mmHg with average heart rate of 57 bpm.   5. The inferior vena cava is normal in size with greater than 50%  respiratory variability, suggesting right atrial pressure of 3 mmHg.   FINDINGS   Left Ventricle: Left ventricular ejection fraction, by estimation, is 70  to 75%. The left ventricle has hyperdynamic function. The left ventricle  has no regional wall motion abnormalities. The left ventricular internal  cavity size was normal in size.  There is mild left ventricular hypertrophy. Left ventricular diastolic  parameters are consistent with Grade I diastolic dysfunction (impaired  relaxation).   Right Ventricle: The right ventricular size is normal. No increase in  right ventricular wall thickness. Right ventricular systolic function is  normal. There is normal pulmonary artery systolic pressure. The tricuspid  regurgitant velocity is 2.21 m/s, and   with an assumed right atrial pressure of 3 mmHg, the estimated right  ventricular systolic pressure is 22.5 mmHg.   Left Atrium: Left atrial size was normal in size.   Right Atrium: Right atrial size was normal in size.   Pericardium: There is no evidence of pericardial effusion.   Mitral Valve: The mitral valve is degenerative in appearance. Mild to  moderate mitral annular calcification. No evidence of mitral valve  regurgitation. No evidence of mitral valve stenosis. The mean mitral valve  gradient is 2.1 mmHg with average heart  rate of 57 bpm.   Tricuspid Valve: The  tricuspid valve is normal in structure. Tricuspid  valve regurgitation is trivial. No evidence of tricuspid stenosis.   The aortic valve is abnormal. There is severe calcifcation of the aortic  valve. Aortic valve regurgitation is not visualized. Severe aortic  stenosis is present.  Pulmonic Valve: The pulmonic valve was normal in structure. Pulmonic valve  regurgitation is mild. No evidence of pulmonic stenosis.   Aorta: The aortic root is normal in size and structure.   Venous: The inferior vena cava is normal in size with greater than 50%  respiratory variability, suggesting right atrial pressure of 3 mmHg.   IAS/Shunts: No atrial level shunt detected by color flow Doppler.   LEFT VENTRICLE  PLAX 2D  LVIDd:         3.20 cm   Diastology  LVIDs:         2.20 cm   LV e' medial:    6.14 cm/s  LV PW:         1.00 cm   LV E/e' medial:  17.6  LV IVS:        1.00 cm   LV e' lateral:   7.07 cm/s  LVOT diam:     1.80 cm   LV E/e' lateral: 15.3  LV SV:         72  LV SV Index:   43  LVOT Area:     2.54 cm     RIGHT VENTRICLE  RV Basal diam:  3.30 cm  RV S prime:     12.90 cm/s  TAPSE (M-mode): 2.4 cm   LEFT ATRIUM             Index        RIGHT ATRIUM  Index  LA diam:        3.60 cm 2.16 cm/m   RA Area:     10.80 cm  LA Vol (A2C):   31.7 ml 19.06 ml/m  RA Volume:   26.10 ml  15.69 ml/m  LA Vol (A4C):   28.5 ml 17.14 ml/m  LA Biplane Vol: 30.8 ml 18.52 ml/m   AORTIC VALVE  AV Area (Vmax):    0.66 cm  AV Area (Vmean):   0.65 cm  AV Area (VTI):     0.64 cm  AV Vmax:           434.12 cm/s  AV Vmean:          303.728 cm/s  AV VTI:            1.118 m  AV Peak Grad:      75.4 mmHg  AV Mean Grad:      46.0 mmHg  LVOT Vmax:         112.50 cm/s  LVOT Vmean:        77.150 cm/s  LVOT VTI:          0.283 m  LVOT/AV VTI ratio: 0.25    AORTA  Ao Root diam: 2.90 cm  Ao Asc diam:  3.00 cm   MITRAL VALVE                TRICUSPID VALVE  MV Area (PHT): 2.72 cm     TR  Peak grad:   19.5 mmHg  MV Mean grad:  2.1 mmHg     TR Vmax:        221.00 cm/s  MV Decel Time: 279 msec  MV E velocity: 108.00 cm/s  SHUNTS  MV A velocity: 125.50 cm/s  Systemic VTI:  0.28 m  MV E/A ratio:  0.86         Systemic Diam: 1.80 cm   CATH (09/2023) Conclusion      Prox LAD to Mid LAD lesion is 20% stenosed.   Mild non-obstructive CAD Normal right heart pressures    Recent Radiology Findings:   CTA (09/2023) FINDINGS: Image quality: Excellent.   Noise artifact is: Limited.   Valve Morphology: Tricuspid aortic valve. Severely calcified leaflets with severely restricted leaflet motion in systole.   Aortic Valve Calcium score: 1982   Aortic annular dimension:   Phase assessed: 25%   Annular area: 317 mm2   Annular perimeter: 64.6 mm   Max diameter: 22.2 mm   Min diameter: 18.6 mm   Annular and subannular calcification: Minimal annular calcium under the RCC. 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 The calcification extends into the LVOT.   Membranous septum length: 6.9 mm   Optimal coplanar projection: LAO 36 CAU 5   Coronary Artery Height above Annulus:   Left Main: 14.0 mm   Right Coronary: 15.5 mm   Sinus of Valsalva Measurements:   Non-coronary: 26.8 mm   Right-coronary: 26.6 mm   Left-coronary: 28.2 mm   Sinus of Valsalva Height:   Non-coronary: 20.6 mm   Right-coronary: 21.1 mm   Left-coronary: 20.5 mm   Sinotubular Junction: 24 mm   Ascending Thoracic Aorta: 28 mm   Coronary Arteries: Normal coronary origin. Right dominance. The study was performed without use of NTG and is insufficient for plaque evaluation. Please refer to recent cardiac catheterization for coronary assessment.   Cardiac Morphology:   Right Atrium: Right atrial size  is within normal limits.   Right Ventricle: The right ventricular cavity is within normal limits.   Left Atrium: Left atrial size is  normal in size with no left atrial appendage filling defect.   Left Ventricle: The ventricular cavity size is within normal limits.   Pulmonary arteries: Normal in size without proximal filling defect.   Pulmonary veins: Normal pulmonary venous drainage.   Pericardium: Normal thickness with no significant effusion or calcium present.   Mitral Valve: The mitral valve is degenerative with mild annular calcium.   Extra-cardiac findings: See attached radiology report for non-cardiac structures.   IMPRESSION: 1. Tricuspid aortic valve with severe calcifications.   2. 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 No other evidence of endocarditis.   3. Small annular measurements noted. Measurements support a 20 mm S3. Perimeter supports a 26 mm Evolut Pro, but sinus of Valsalva measurements are borderline.   4. Sufficient coronary to annulus distance.   5. Optimal Fluoroscopic Angle for Delivery: LAO 36 CAU 5      Recent Lab Findings: Lab Results  Component Value Date   WBC 7.5 09/10/2023   HGB 11.6 (L) 09/21/2023   HCT 34.0 (L) 09/21/2023   PLT 270 09/10/2023   GLUCOSE 92 09/10/2023   TRIG 53 05/25/2020   ALT 38 05/28/2020   AST 68 (H) 05/28/2020   NA 140 09/21/2023   K 3.8 09/21/2023   CL 99 09/10/2023   CREATININE 1.20 (H) 09/10/2023   BUN 36 (H) 09/10/2023   CO2 28 09/10/2023      Assessment / Plan:     83 yo female with NYHA class 2 symptoms of severe AS with normal LV function and no CAD. She is a candidate for AVR and secondary to her age would best be served with TAVR. She understands all the risks and goals of surgery and recovery. She would probably benefit from the medtronic valve due to a better valve area. She would be a high risk  bail out candidate but even after a discussion on how I felt she would do poorly with open heart surgery, the daughter wants to discuss this with her brothers to make final  decision if they want mom to be a bail out or not and so I will have her set up for bail out but will get final decision on day of procedure   I have spent 60 min in review of the records, viewing studies and in face to face with patient and in coordination of future care    Eugenio Hoes 11/07/2023 4:06 PM

## 2023-11-08 ENCOUNTER — Institutional Professional Consult (permissible substitution) (INDEPENDENT_AMBULATORY_CARE_PROVIDER_SITE_OTHER): Admitting: Thoracic Surgery (Cardiothoracic Vascular Surgery)

## 2023-11-08 ENCOUNTER — Encounter: Payer: Self-pay | Admitting: Thoracic Surgery (Cardiothoracic Vascular Surgery)

## 2023-11-08 VITALS — BP 129/67 | HR 69 | Resp 20 | Ht 66.0 in | Wt 135.0 lb

## 2023-11-08 DIAGNOSIS — I350 Nonrheumatic aortic (valve) stenosis: Secondary | ICD-10-CM | POA: Insufficient documentation

## 2023-11-08 NOTE — Patient Instructions (Signed)
 TAVR

## 2023-11-12 ENCOUNTER — Other Ambulatory Visit: Payer: Self-pay

## 2023-11-12 DIAGNOSIS — I350 Nonrheumatic aortic (valve) stenosis: Secondary | ICD-10-CM

## 2023-11-16 ENCOUNTER — Other Ambulatory Visit: Payer: Self-pay

## 2023-11-16 ENCOUNTER — Other Ambulatory Visit (HOSPITAL_COMMUNITY)

## 2023-11-16 ENCOUNTER — Encounter (HOSPITAL_COMMUNITY)
Admission: RE | Admit: 2023-11-16 | Discharge: 2023-11-16 | Disposition: A | Discharge status: Home / Self Care | Source: Ambulatory Visit | Attending: Cardiovascular Disease | Admitting: Cardiovascular Disease

## 2023-11-16 ENCOUNTER — Ambulatory Visit (HOSPITAL_COMMUNITY)
Admission: RE | Admit: 2023-11-16 | Discharge: 2023-11-16 | Disposition: A | Discharge status: Home / Self Care | Source: Ambulatory Visit | Attending: Cardiovascular Disease | Admitting: Cardiovascular Disease

## 2023-11-16 DIAGNOSIS — Z01818 Encounter for other preprocedural examination: Secondary | ICD-10-CM | POA: Diagnosis present

## 2023-11-16 DIAGNOSIS — I350 Nonrheumatic aortic (valve) stenosis: Secondary | ICD-10-CM | POA: Insufficient documentation

## 2023-11-16 LAB — COMPREHENSIVE METABOLIC PANEL WITH GFR
ALT: 26 U/L (ref 0–44)
AST: 34 U/L (ref 15–41)
Albumin: 3.6 g/dL (ref 3.5–5.0)
Alkaline Phosphatase: 67 U/L (ref 38–126)
Anion gap: 9 (ref 5–15)
BUN: 24 mg/dL — ABNORMAL HIGH (ref 8–23)
CO2: 28 mmol/L (ref 22–32)
Calcium: 9.7 mg/dL (ref 8.9–10.3)
Chloride: 98 mmol/L (ref 98–111)
Creatinine, Ser: 1.27 mg/dL — ABNORMAL HIGH (ref 0.44–1.00)
GFR, Estimated: 42 mL/min — ABNORMAL LOW (ref >60)
Glucose, Bld: 93 mg/dL (ref 70–99)
Potassium: 4.2 mmol/L (ref 3.5–5.1)
Sodium: 135 mmol/L (ref 135–145)
Total Bilirubin: 0.9 mg/dL (ref 0.0–1.2)
Total Protein: 7.1 g/dL (ref 6.5–8.1)

## 2023-11-16 LAB — CBC
HCT: 39.1 % (ref 36.0–46.0)
Hemoglobin: 12.8 g/dL (ref 12.0–15.0)
MCH: 30.4 pg (ref 26.0–34.0)
MCHC: 32.7 g/dL (ref 30.0–36.0)
MCV: 92.9 fL (ref 80.0–100.0)
Platelets: 246 10*3/uL (ref 150–400)
RBC: 4.21 MIL/uL (ref 3.87–5.11)
RDW: 13 % (ref 11.5–15.5)
WBC: 7 10*3/uL (ref 4.0–10.5)
nRBC: 0 % (ref 0.0–0.2)

## 2023-11-16 LAB — SURGICAL PCR SCREEN
MRSA, PCR: NEGATIVE
Staphylococcus aureus: NEGATIVE

## 2023-11-16 LAB — PROTIME-INR
INR: 1 (ref 0.8–1.2)
Prothrombin Time: 13.3 s (ref 11.4–15.2)

## 2023-11-16 LAB — TYPE AND SCREEN
ABO/RH(D): O POS
Antibody Screen: NEGATIVE

## 2023-11-16 NOTE — Progress Notes (Signed)
 Patient signed all consents at PAT lab appointment. CHG soap and instructions were given to patient. CHG surgical prep reviewed with patient and all questions answered.  Patients chart send to anesthesia for review. Pt denies any respiratory illness/infection in the last two months.

## 2023-11-16 NOTE — Progress Notes (Signed)
 Pt unable to provide urine sample at PAT. Lauren, RN notified. Okay to get urine sample on DOS

## 2023-11-19 MED ORDER — HEPARIN 30,000 UNITS/1000 ML (OHS) CELLSAVER SOLUTION
Status: DC
  Filled 2023-11-19 (×2): qty 1000

## 2023-11-19 MED ORDER — MAGNESIUM SULFATE 50 % IJ SOLN
40.0000 meq | INTRAMUSCULAR | Status: DC
Start: 2023-11-20 — End: 2023-11-21
  Filled 2023-11-19 (×2): qty 9.85

## 2023-11-19 MED ORDER — DEXMEDETOMIDINE HCL IN NACL 400 MCG/100ML IV SOLN
0.1000 ug/kg/h | INTRAVENOUS | Status: AC
  Administered 2023-11-20: 1 ug/kg/h via INTRAVENOUS
  Administered 2023-11-20 (×2): 30.6 ug via INTRAVENOUS
  Filled 2023-11-19: qty 100

## 2023-11-19 MED ORDER — POTASSIUM CHLORIDE 2 MEQ/ML IV SOLN
80.0000 meq | INTRAVENOUS | Status: DC
  Filled 2023-11-19 (×2): qty 40

## 2023-11-19 MED ORDER — NOREPINEPHRINE 4 MG/250ML-% IV SOLN
0.0000 ug/min | INTRAVENOUS | Status: DC
  Filled 2023-11-19: qty 250

## 2023-11-19 MED ORDER — CEFAZOLIN SODIUM-DEXTROSE 2-4 GM/100ML-% IV SOLN
2.0000 g | INTRAVENOUS | Status: AC
  Administered 2023-11-20: 2 g via INTRAVENOUS
  Filled 2023-11-19: qty 100

## 2023-11-19 NOTE — H&P (Signed)
 [address]       [place] [address]             [phone]                                   [name] [facility] Medical Record #[id number] Date of Birth: [birth year]   Emory Harps, MD   Chief Complaint:  AS    History of Present Illness:     Pt is an 83 yo female who was noted to have severe AS on an outside echo for a work up of a new murmur. She was referred to Dr Abel Hoe and was found to be suffering from increasing DOE and fatigue. She had a repeat echo with an EF of 70% and severe AS with a mean gradient of 46 mmHg and a  valve area of 0.64cm2. Dr Abel Hoe felt pt was a candidate for AVR and she underwent a caht with non obstructive CAD and a TAVR CTA which had acceptable femoral access for either a 23mm Sapein or 26mm Evolute PR (sinuses somewhat borderline however felt to be acceptable by TAVR team). Pt is awaiting scheduling. She has been suffering from allergies and has wicked puffy eyes today but she assures me she is not "sick". 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   Osteoporosis     Psoriasis     Scoliosis     Severe aortic stenosis     Thyroid  disease     Wears glasses                 Past Surgical History:  Procedure Laterality Date   CESAREAN SECTION   07/31/1976    TWINS, ONE WAS STILLBORN   MOUTH SURGERY   07/31/1994    tongue resection   NODULE REMOVED        BENIGN, LOWER NECK   RIGHT HEART CATH AND CORONARY ANGIOGRAPHY N/A 09/21/2023    Procedure: RIGHT HEART CATH AND CORONARY ANGIOGRAPHY;  Surgeon: Odie Benne,  MD;  Location: [facility] INVAS[facility];  Service: Cardiovascular;  Laterality: N/A;          Tobacco Use History  Social History        Tobacco Use  Smoking Status Former   Current packs/day: 0.00   Average packs/day: 0.2 packs/day for 2.0 years (0.4 ttl pk-yrs)   Types: Cigarettes   Start date: 07/31/1992   Quit date: 07/31/1994   Years since quitting: 29.2  Smokeless Tobacco Never  Tobacco Comments    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      Social History       Substance and Sexual Activity  Alcohol Use No      Social History         Socioeconomic History  Marital status: Widowed      Spouse name: Not on file   Number of children: 3   Years of education: Not on file   Highest education level: Not on file  Occupational History   Not on file  Tobacco Use   Smoking status: Former      Current packs/day: 0.00      Average packs/day: 0.2 packs/day for 2.0 years (0.4 ttl pk-yrs)      Types: Cigarettes      Start date: 07/31/1992      Quit date: 07/31/1994      Years since quitting: 29.2   Smokeless tobacco: Never   Tobacco comments:      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  Vaping Use   Vaping status: Never Used  Substance and Sexual Activity   Alcohol use: No   Drug use: No   Sexual activity: Not on file  Other Topics Concern   Not on file  Social History Narrative    12/28/21 lives alone    Social Drivers of Health    Financial Resource Strain: Not on file  Food Insecurity: Not on file  Transportation Needs: Not on file  Physical Activity: Not on file  Stress: Not on file  Social Connections: Not on file  Intimate Partner Violence: Not on file      Allergies       Allergies  Allergen Reactions   Brimonidine Itching      Red eyes   Naphazoline-Polyethyl Glycol        THAT YOU USE TO DILATE YOUR EYES.Brandy AasMAKES BP GO UP, CAN ONLY USE HALF STRENGTH   Tropicamide Other (See Comments)      Elevates IOP OK to use 0.5%              Current Outpatient Medications   Medication Sig Dispense Refill   Calcium  Carbonate-Vitamin D 600-400 MG-UNIT tablet Take 1 tablet by mouth daily. Caltrate       Cholecalciferol (VITAMIN D3) 10 MCG (400 UNIT) tablet Take 400 Units by mouth daily.       dorzolamide -timolol  (COSOPT ) 22.3-6.8 MG/ML ophthalmic solution Place 1 drop into the left eye 2 (two) times daily.       levothyroxine  (SYNTHROID ) 75 MCG tablet Take 75 mcg by mouth daily.       LUMIGAN 0.01 % SOLN Place 1 drop into the left eye at bedtime.       memantine  (NAMENDA ) 5 MG tablet TAKE 1 TABLET BY MOUTH EVERYDAY AT BEDTIME 90 tablet 4   metoprolol succinate (TOPROL-XL) 100 MG 24 hr tablet Take 100 mg by mouth daily.       Multiple Vitamins-Minerals (CENTRUM SILVER PO) Take 1 tablet by mouth daily.        Omega-3 Fatty Acids (FISH OIL) 500 MG CAPS Take 500 mg by mouth daily.       prednisoLONE  acetate (PRED FORTE ) 1 % ophthalmic suspension Place 1 drop into the right eye daily with breakfast.       rosuvastatin  (CRESTOR ) 5 MG tablet Take 5 mg by mouth daily.       valsartan-hydrochlorothiazide (DIOVAN-HCT) 160-12.5 MG tablet Take 1 tablet by mouth daily.          No current facility-administered medications for this visit.               Family History  Problem Relation Age of Onset   Thyroid  disease Mother     Dementia Mother  Thyroid  disease Sister                  Physical Exam: Lungs: clear Card: RR with 3/6 sem Ext: Warm no edema Neuro: intact         Diagnostic Studies & Laboratory data: I have personally reviewed the following studies and agree with the findings   TTE (09/2023) IMPRESSIONS     1. The aortic valve is abnormal. There is severe calcifcation of the  aortic valve. Aortic valve regurgitation is not visualized. Severe aortic  valve stenosis. Aortic valve area, by VTI measures 0.64 cm. Aortic valve  mean gradient measures 46.0 mmHg.  Aortic valve Vmax measures 4.34 m/s. Aortic valve acceleration time  measures 113  msec.   2. Left ventricular ejection fraction, by estimation, is 70 to 75%. The  left ventricle has hyperdynamic function. The left ventricle has no  regional wall motion abnormalities. There is mild left ventricular  hypertrophy. Left ventricular diastolic  parameters are consistent with Grade I diastolic dysfunction (impaired  relaxation).   3. Right ventricular systolic function is normal. The right ventricular  size is normal. There is normal pulmonary artery systolic pressure. The  estimated right ventricular systolic pressure is 22.5 mmHg.   4. The mitral valve is degenerative. No evidence of mitral valve  regurgitation. No evidence of mitral stenosis. The mean mitral valve  gradient is 2.1 mmHg with average heart rate of 57 bpm.   5. The inferior vena cava is normal in size with greater than 50%  respiratory variability, suggesting right atrial pressure of 3 mmHg.   FINDINGS   Left Ventricle: Left ventricular ejection fraction, by estimation, is 70  to 75%. The left ventricle has hyperdynamic function. The left ventricle  has no regional wall motion abnormalities. The left ventricular internal  cavity size was normal in size.  There is mild left ventricular hypertrophy. Left ventricular diastolic  parameters are consistent with Grade I diastolic dysfunction (impaired  relaxation).   Right Ventricle: The right ventricular size is normal. No increase in  right ventricular wall thickness. Right ventricular systolic function is  normal. There is normal pulmonary artery systolic pressure. The tricuspid  regurgitant velocity is 2.21 m/s, and   with an assumed right atrial pressure of 3 mmHg, the estimated right  ventricular systolic pressure is 22.5 mmHg.   Left Atrium: Left atrial size was normal in size.   Right Atrium: Right atrial size was normal in size.   Pericardium: There is no evidence of pericardial effusion.   Mitral Valve: The mitral valve is degenerative in  appearance. Mild to  moderate mitral annular calcification. No evidence of mitral valve  regurgitation. No evidence of mitral valve stenosis. The mean mitral valve  gradient is 2.1 mmHg with average heart  rate of 57 bpm.   Tricuspid Valve: The tricuspid valve is normal in structure. Tricuspid  valve regurgitation is trivial. No evidence of tricuspid stenosis.   The aortic valve is abnormal. There is severe calcifcation of the aortic  valve. Aortic valve regurgitation is not visualized. Severe aortic  stenosis is present.  Pulmonic Valve: The pulmonic valve was normal in structure. Pulmonic valve  regurgitation is mild. No evidence of pulmonic stenosis.   Aorta: The aortic root is normal in size and structure.   Venous: The inferior vena cava is normal in size with greater than 50%  respiratory variability, suggesting right atrial pressure of 3 mmHg.   IAS/Shunts: No atrial level shunt detected by  color flow Doppler.   LEFT VENTRICLE  PLAX 2D  LVIDd:         3.20 cm   Diastology  LVIDs:         2.20 cm   LV e' medial:    6.14 cm/s  LV PW:         1.00 cm   LV E/e' medial:  17.6  LV IVS:        1.00 cm   LV e' lateral:   7.07 cm/s  LVOT diam:     1.80 cm   LV E/e' lateral: 15.3  LV SV:         72  LV SV Index:   43  LVOT Area:     2.54 cm     RIGHT VENTRICLE  RV Basal diam:  3.30 cm  RV S prime:     12.90 cm/s  TAPSE (M-mode): 2.4 cm   LEFT ATRIUM             Index        RIGHT ATRIUM           Index  LA diam:        3.60 cm 2.16 cm/m   RA Area:     10.80 cm  LA Vol (A2C):   31.7 ml 19.06 ml/m  RA Volume:   26.10 ml  15.69 ml/m  LA Vol (A4C):   28.5 ml 17.14 ml/m  LA Biplane Vol: 30.8 ml 18.52 ml/m   AORTIC VALVE  AV Area (Vmax):    0.66 cm  AV Area (Vmean):   0.65 cm  AV Area (VTI):     0.64 cm  AV Vmax:           434.12 cm/s  AV Vmean:          303.728 cm/s  AV VTI:            1.118 m  AV Peak Grad:      75.4 mmHg  AV Mean Grad:      46.0 mmHg  LVOT  Vmax:         112.50 cm/s  LVOT Vmean:        77.150 cm/s  LVOT VTI:          0.283 m  LVOT/AV VTI ratio: 0.25    AORTA  Ao Root diam: 2.90 cm  Ao Asc diam:  3.00 cm   MITRAL VALVE                TRICUSPID VALVE  MV Area (PHT): 2.72 cm     TR Peak grad:   19.5 mmHg  MV Mean grad:  2.1 mmHg     TR Vmax:        221.00 cm/s  MV Decel Time: 279 msec  MV E velocity: 108.00 cm/s  SHUNTS  MV A velocity: 125.50 cm/s  Systemic VTI:  0.28 m  MV E/A ratio:  0.86         Systemic Diam: 1.80 cm    CATH (09/2023) Conclusion       Prox LAD to Mid LAD lesion is 20% stenosed.   Mild non-obstructive CAD Normal right heart pressures    Recent Radiology Findings:   CTA (09/2023) FINDINGS: Image quality: Excellent.   Noise artifact is: Limited.   Valve Morphology: Tricuspid aortic valve. Severely calcified leaflets with severely restricted leaflet motion in systole.   Aortic Valve Calcium  score: 1982   Aortic annular  dimension:   Phase assessed: 25%   Annular area: 317 mm2   Annular perimeter: 64.6 mm   Max diameter: 22.2 mm   Min diameter: 18.6 mm   Annular and subannular calcification: Minimal annular calcium  under the RCC. 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 The calcification extends into the LVOT.   Membranous septum length: 6.9 mm   Optimal coplanar projection: LAO 36 CAU 5   Coronary Artery Height above Annulus:   Left Main: 14.0 mm   Right Coronary: 15.5 mm   Sinus of Valsalva Measurements:   Non-coronary: 26.8 mm   Right-coronary: 26.6 mm   Left-coronary: 28.2 mm   Sinus of Valsalva Height:   Non-coronary: 20.6 mm   Right-coronary: 21.1 mm   Left-coronary: 20.5 mm   Sinotubular Junction: 24 mm   Ascending Thoracic Aorta: 28 mm   Coronary Arteries: Normal coronary origin. Right dominance. The study was performed without use of NTG and is insufficient for plaque evaluation. Please refer to  recent cardiac catheterization for coronary assessment.   Cardiac Morphology:   Right Atrium: Right atrial size is within normal limits.   Right Ventricle: The right ventricular cavity is within normal limits.   Left Atrium: Left atrial size is normal in size with no left atrial appendage filling defect.   Left Ventricle: The ventricular cavity size is within normal limits.   Pulmonary arteries: Normal in size without proximal filling defect.   Pulmonary veins: Normal pulmonary venous drainage.   Pericardium: Normal thickness with no significant effusion or calcium  present.   Mitral Valve: The mitral valve is degenerative with mild annular calcium .   Extra-cardiac findings: See attached radiology report for non-cardiac structures.   IMPRESSION: 1. Tricuspid aortic valve with severe calcifications.   2. 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 No other evidence of endocarditis.   3. Small annular measurements noted. Measurements support a 20 mm S3. Perimeter supports a 26 mm Evolut Pro, but sinus of Valsalva measurements are borderline.   4. Sufficient coronary to annulus distance.   5. Optimal Fluoroscopic Angle for Delivery: LAO 36 CAU 5       Recent Lab Findings: Recent Labs       Lab Results  Component Value Date    WBC 7.5 09/10/2023    HGB 11.6 (L) 09/21/2023    HCT 34.0 (L) 09/21/2023    PLT 270 09/10/2023    GLUCOSE 92 09/10/2023    TRIG 53 05/25/2020    ALT 38 05/28/2020    AST 68 (H) 05/28/2020    NA 140 09/21/2023    K 3.8 09/21/2023    CL 99 09/10/2023    CREATININE 1.20 (H) 09/10/2023    BUN 36 (H) 09/10/2023    CO2 28 09/10/2023            Assessment / Plan:     83 yo female with NYHA class 2 symptoms of severe AS with normal LV function and no CAD. She is a candidate for AVR and secondary to her age would best be served with TAVR. She understands all the risks and goals of surgery and  recovery. She would probably benefit from the medtronic valve due to a better valve area. She would be a high risk  bail out candidate but even after a discussion on how I felt she would do poorly with open heart surgery, the daughter  wants to discuss this with her brothers to make final decision if they want mom to be a bail out or not and so I will have her set up for bail out but will get final decision on day of procedure

## 2023-11-20 ENCOUNTER — Inpatient Hospital Stay (HOSPITAL_COMMUNITY)
Admission: RE | Admit: 2023-11-20 | Discharge: 2023-11-21 | DRG: 267 | Disposition: A | Discharge status: Home / Self Care | Attending: Cardiovascular Disease | Admitting: Cardiovascular Disease

## 2023-11-20 ENCOUNTER — Encounter (HOSPITAL_COMMUNITY)
Admission: RE | Disposition: A | Discharge status: Home / Self Care | Payer: Self-pay | Source: Home / Self Care | Attending: Cardiovascular Disease

## 2023-11-20 ENCOUNTER — Encounter (HOSPITAL_COMMUNITY): Payer: Self-pay | Admitting: Cardiovascular Disease

## 2023-11-20 ENCOUNTER — Inpatient Hospital Stay (HOSPITAL_COMMUNITY)

## 2023-11-20 ENCOUNTER — Other Ambulatory Visit: Payer: Self-pay

## 2023-11-20 ENCOUNTER — Inpatient Hospital Stay (HOSPITAL_COMMUNITY): Admitting: Physician Assistant

## 2023-11-20 DIAGNOSIS — I350 Nonrheumatic aortic (valve) stenosis: Principal | ICD-10-CM

## 2023-11-20 DIAGNOSIS — M810 Age-related osteoporosis without current pathological fracture: Secondary | ICD-10-CM | POA: Diagnosis present

## 2023-11-20 DIAGNOSIS — R911 Solitary pulmonary nodule: Secondary | ICD-10-CM | POA: Diagnosis present

## 2023-11-20 DIAGNOSIS — Z888 Allergy status to other drugs, medicaments and biological substances status: Secondary | ICD-10-CM

## 2023-11-20 DIAGNOSIS — Z79899 Other long term (current) drug therapy: Secondary | ICD-10-CM

## 2023-11-20 DIAGNOSIS — I2510 Atherosclerotic heart disease of native coronary artery without angina pectoris: Secondary | ICD-10-CM | POA: Diagnosis present

## 2023-11-20 DIAGNOSIS — R413 Other amnesia: Secondary | ICD-10-CM | POA: Diagnosis present

## 2023-11-20 DIAGNOSIS — Z006 Encounter for examination for normal comparison and control in clinical research program: Secondary | ICD-10-CM

## 2023-11-20 DIAGNOSIS — Z87891 Personal history of nicotine dependence: Secondary | ICD-10-CM

## 2023-11-20 DIAGNOSIS — E785 Hyperlipidemia, unspecified: Secondary | ICD-10-CM | POA: Diagnosis present

## 2023-11-20 DIAGNOSIS — I10 Essential (primary) hypertension: Secondary | ICD-10-CM

## 2023-11-20 DIAGNOSIS — Z79890 Hormone replacement therapy (postmenopausal): Secondary | ICD-10-CM

## 2023-11-20 DIAGNOSIS — H409 Unspecified glaucoma: Secondary | ICD-10-CM | POA: Diagnosis present

## 2023-11-20 DIAGNOSIS — Z8349 Family history of other endocrine, nutritional and metabolic diseases: Secondary | ICD-10-CM | POA: Diagnosis not present

## 2023-11-20 DIAGNOSIS — L409 Psoriasis, unspecified: Secondary | ICD-10-CM | POA: Diagnosis present

## 2023-11-20 DIAGNOSIS — M419 Scoliosis, unspecified: Secondary | ICD-10-CM | POA: Diagnosis present

## 2023-11-20 DIAGNOSIS — J449 Chronic obstructive pulmonary disease, unspecified: Secondary | ICD-10-CM

## 2023-11-20 DIAGNOSIS — E039 Hypothyroidism, unspecified: Secondary | ICD-10-CM | POA: Diagnosis present

## 2023-11-20 DIAGNOSIS — J9811 Atelectasis: Secondary | ICD-10-CM | POA: Diagnosis present

## 2023-11-20 DIAGNOSIS — Z952 Presence of prosthetic heart valve: Secondary | ICD-10-CM | POA: Diagnosis not present

## 2023-11-20 DIAGNOSIS — Z85819 Personal history of malignant neoplasm of unspecified site of lip, oral cavity, and pharynx: Secondary | ICD-10-CM

## 2023-11-20 DIAGNOSIS — G3184 Mild cognitive impairment, so stated: Secondary | ICD-10-CM | POA: Diagnosis present

## 2023-11-20 DIAGNOSIS — R4189 Other symptoms and signs involving cognitive functions and awareness: Secondary | ICD-10-CM | POA: Diagnosis present

## 2023-11-20 HISTORY — DX: Presence of prosthetic heart valve: Z95.2

## 2023-11-20 HISTORY — PX: INTRAOPERATIVE TRANSTHORACIC ECHOCARDIOGRAM: SHX6523

## 2023-11-20 LAB — ECHOCARDIOGRAM LIMITED
AR max vel: 1.07 cm2
AV Area VTI: 1.55 cm2
AV Area mean vel: 1.31 cm2
AV Mean grad: 11 mmHg
AV Peak grad: 23.5 mmHg
Ao pk vel: 2.43 m/s

## 2023-11-20 LAB — ABO/RH: ABO/RH(D): O POS

## 2023-11-20 SURGERY — TRANSCATHETER AORTIC VALVE REPLACEMENT, TRANSFEMORAL (CATHLAB)
Anesthesia: Monitor Anesthesia Care

## 2023-11-20 MED ORDER — CHLORHEXIDINE GLUCONATE 4 % EX SOLN: 30.0000 mL | CUTANEOUS | Status: DC

## 2023-11-20 MED ORDER — PROPOFOL 10 MG/ML IV BOLUS
INTRAVENOUS | Status: DC | PRN
  Administered 2023-11-20 (×2): 10 mg via INTRAVENOUS

## 2023-11-20 MED ORDER — HEPARIN (PORCINE) IN NACL 1000-0.9 UT/500ML-% IV SOLN
INTRAVENOUS | Status: DC | PRN
  Administered 2023-11-20 (×2): 500 mL

## 2023-11-20 MED ORDER — ALBUMIN HUMAN 5 % IV SOLN: INTRAVENOUS | Status: DC | PRN

## 2023-11-20 MED ORDER — PROTAMINE SULFATE 10 MG/ML IV SOLN
INTRAVENOUS | Status: DC | PRN
  Administered 2023-11-20: 90 mg via INTRAVENOUS

## 2023-11-20 MED ORDER — SODIUM CHLORIDE 0.9% FLUSH: 3.0000 mL | INTRAVENOUS | Status: DC | PRN

## 2023-11-20 MED ORDER — ROSUVASTATIN CALCIUM 5 MG PO TABS: 5.0000 mg | ORAL_TABLET | Freq: Every day | ORAL | Status: DC

## 2023-11-20 MED ORDER — SODIUM CHLORIDE 0.9 % IV SOLN: INTRAVENOUS | Status: AC

## 2023-11-20 MED ORDER — CHLORHEXIDINE GLUCONATE 0.12 % MT SOLN
15.0000 mL | Freq: Once | OROMUCOSAL | Status: AC
  Administered 2023-11-20: 15 mL via OROMUCOSAL

## 2023-11-20 MED ORDER — NITROGLYCERIN IN D5W 200-5 MCG/ML-% IV SOLN
0.0000 ug/min | INTRAVENOUS | Status: DC
Start: 2023-11-20 — End: 2023-11-20

## 2023-11-20 MED ORDER — OXYCODONE HCL 5 MG PO TABS: 5.0000 mg | ORAL_TABLET | ORAL | Status: DC | PRN

## 2023-11-20 MED ORDER — SODIUM CHLORIDE 0.9% FLUSH
3.0000 mL | Freq: Two times a day (BID) | INTRAVENOUS | Status: DC
  Administered 2023-11-21 (×2): 3 mL via INTRAVENOUS

## 2023-11-20 MED ORDER — ACETAMINOPHEN 325 MG PO TABS: 650.0000 mg | ORAL_TABLET | Freq: Four times a day (QID) | ORAL | Status: DC | PRN

## 2023-11-20 MED ORDER — ACETAMINOPHEN 650 MG RE SUPP: 650.0000 mg | Freq: Four times a day (QID) | RECTAL | Status: DC | PRN

## 2023-11-20 MED ORDER — LIDOCAINE HCL (PF) 1 % IJ SOLN
INTRAMUSCULAR | Status: DC | PRN
  Administered 2023-11-20 (×2): 10 mL

## 2023-11-20 MED ORDER — ONDANSETRON HCL 4 MG/2ML IJ SOLN: 4.0000 mg | Freq: Four times a day (QID) | INTRAMUSCULAR | Status: DC | PRN

## 2023-11-20 MED ORDER — MEMANTINE HCL 5 MG PO TABS
5.0000 mg | ORAL_TABLET | Freq: Every day | ORAL | Status: DC
  Administered 2023-11-20: 5 mg via ORAL
  Filled 2023-11-20 (×2): qty 1

## 2023-11-20 MED ORDER — CHLORHEXIDINE GLUCONATE 4 % EX SOLN: 60.0000 mL | Freq: Once | CUTANEOUS | Status: DC

## 2023-11-20 MED ORDER — LACTATED RINGERS IV SOLN: INTRAVENOUS | Status: DC

## 2023-11-20 MED ORDER — LATANOPROST 0.005 % OP SOLN
1.0000 [drp] | Freq: Every day | OPHTHALMIC | Status: DC
  Administered 2023-11-20: 1 [drp] via OPHTHALMIC
  Filled 2023-11-20: qty 2.5

## 2023-11-20 MED ORDER — CEFAZOLIN SODIUM-DEXTROSE 2-4 GM/100ML-% IV SOLN
2.0000 g | Freq: Three times a day (TID) | INTRAVENOUS | Status: AC
  Administered 2023-11-20 – 2023-11-21 (×2): 2 g via INTRAVENOUS
  Filled 2023-11-20 (×2): qty 100

## 2023-11-20 MED ORDER — LEVOTHYROXINE SODIUM 75 MCG PO TABS
75.0000 ug | ORAL_TABLET | Freq: Every day | ORAL | Status: DC
  Administered 2023-11-21: 75 ug via ORAL
  Filled 2023-11-20: qty 1

## 2023-11-20 MED ORDER — IOHEXOL 350 MG/ML SOLN
INTRAVENOUS | Status: DC | PRN
  Administered 2023-11-20: 15 mL

## 2023-11-20 MED ORDER — TRAMADOL HCL 50 MG PO TABS: 50.0000 mg | ORAL_TABLET | ORAL | Status: DC | PRN

## 2023-11-20 MED ORDER — SODIUM CHLORIDE 0.9 % IV SOLN: 250.0000 mL | INTRAVENOUS | Status: DC | PRN

## 2023-11-20 MED ORDER — SODIUM CHLORIDE 0.9 % IV SOLN: INTRAVENOUS | Status: DC

## 2023-11-20 MED ORDER — ASPIRIN 81 MG PO CHEW
81.0000 mg | CHEWABLE_TABLET | Freq: Every day | ORAL | Status: DC
  Administered 2023-11-21: 81 mg via ORAL
  Filled 2023-11-20: qty 1

## 2023-11-20 MED ORDER — HEPARIN SODIUM (PORCINE) 1000 UNIT/ML IJ SOLN
INTRAMUSCULAR | Status: DC | PRN
  Administered 2023-11-20: 9000 [IU] via INTRAVENOUS

## 2023-11-20 MED ORDER — HEPARIN (PORCINE) IN NACL 2000-0.9 UNIT/L-% IV SOLN
INTRAVENOUS | Status: DC | PRN
  Administered 2023-11-20: 1000 mL

## 2023-11-20 MED ORDER — PHENYLEPHRINE HCL-NACL 20-0.9 MG/250ML-% IV SOLN
INTRAVENOUS | Status: DC | PRN
  Administered 2023-11-20: 40 ug/min via INTRAVENOUS

## 2023-11-20 MED ORDER — MORPHINE SULFATE (PF) 2 MG/ML IV SOLN: 1.0000 mg | INTRAVENOUS | Status: DC | PRN

## 2023-11-20 SURGICAL SUPPLY — 27 items
BAG SNAP BAND KOVER 36X36 (MISCELLANEOUS) ×2 IMPLANT
BALLOON TRUE 18X4.5 (BALLOONS) IMPLANT
CABLE ADAPT PACING TEMP 12FT (ADAPTER) IMPLANT
CATH DIAG 6FR PIGTAIL ANGLED (CATHETERS) IMPLANT
CATH INFINITI 5 FR STR PIGTAIL (CATHETERS) IMPLANT
CATH INFINITI 6F AL1 (CATHETERS) IMPLANT
CATH S G BIP PACING (CATHETERS) IMPLANT
CLOSURE MYNX CONTROL 6F/7F (Vascular Products) IMPLANT
CLOSURE PERCLOSE PROSTYLE (VASCULAR PRODUCTS) IMPLANT
KIT MICROPUNCTURE NIT STIFF (SHEATH) IMPLANT
PACK CARDIAC CATHETERIZATION (CUSTOM PROCEDURE TRAY) ×1 IMPLANT
SET ATX-X65L (MISCELLANEOUS) IMPLANT
SHEATH BRITE TIP 7FR 35CM (SHEATH) IMPLANT
SHEATH DRYSEAL FLEX 14FR 33CM (SHEATH) IMPLANT
SHEATH PINNACLE 6F 10CM (SHEATH) IMPLANT
SHEATH PINNACLE 8F 10CM (SHEATH) IMPLANT
SHIELD CATH-GARD CONTAMINATION (MISCELLANEOUS) IMPLANT
STOPCOCK MORSE 400PSI 3WAY (MISCELLANEOUS) ×2 IMPLANT
SYSTEM EVOLUT FX DELIVRY 23-29 (CATHETERS) IMPLANT
SYSTEM EVOLUT FX LOADING 23-29 (CATHETERS) IMPLANT
TRANSDUCER W/STOPCOCK (MISCELLANEOUS) IMPLANT
TUBING ART PRESS 72 MALE/FEM (TUBING) IMPLANT
VALVE EVOLUT FX 26 (Valve) IMPLANT
WIRE AMPLATZ SS-J .035X180CM (WIRE) IMPLANT
WIRE EMERALD 3MM-J .035X150CM (WIRE) IMPLANT
WIRE EMERALD 3MM-J .035X260CM (WIRE) IMPLANT
WIRE SAFARI SM CURVE 275 (WIRE) IMPLANT

## 2023-11-20 NOTE — Discharge Instructions (Signed)

## 2023-11-20 NOTE — Progress Notes (Signed)
 At 1800, pt noted to have oozing bleeding noted on right groin site.  Drainage marked and monitored.  Supervisor notified.  Recommend continued observation.  At 1920, gauze pad noted to be partially saturated.  Dressing changed, site observed to have scant non-pulsatile oozing present.  Area soft, no hematoma noted.  Pt reports tenderness on palpation.  Manual pressure held for 10 minutes, new dressing applied.  Will continue to monitor.

## 2023-11-20 NOTE — CV Procedure (Signed)
 [facility]  TAVR OPERATIVE NOTE   Date of Procedure:  11/20/2023  Preoperative Diagnosis: Severe Aortic Stenosis   Postoperative Diagnosis: Same   Procedure:   Transcatheter Aortic Valve Replacement - Transfemoral Approach  Medtronic Evolut FX THV (size 26 mm, model # [id number] serial # [id number] )   Co-Surgeons:  Antoinette Batman, MD and Melene Sportsman, MD   Anesthesiologist:  Brain Cahill  Echocardiographer:  Stann Earnest  Pre-operative Echo Findings: Severe aortic stenosis Normal left ventricular systolic function  Post-operative Echo Findings: No paravalvular leak Normal left ventricular systolic function  BRIEF CLINICAL NOTE AND INDICATIONS FOR SURGERY  83 yo female with history of oral cancer, COPD, HTN, HLD, hypothyroidism, memory loss, psoriasis and severe aortic stenosis. Echo 08/29/23 with LVEF=70-75%. Severe aortic stenosis with mean gradient of 46 mmHg, AVA 0.64 cm2, DI 0.25. Cardiac cath with mild non-obstructive CAD.   During the course of the patient's preoperative work up they have been evaluated comprehensively by a multidisciplinary team of specialists coordinated through the Multidisciplinary Heart Valve Clinic in the [facility].  They have been demonstrated to suffer from symptomatic severe aortic stenosis as noted above. The patient has been counseled extensively as to the relative risks and benefits of all options for the treatment of severe aortic stenosis including long term medical therapy, conventional surgery for aortic valve replacement, and transcatheter aortic valve replacement.  The patient has been independently evaluated by Dr. Honey Lusty with CT surgery and they are felt to be at high risk for conventional surgical aortic valve replacement. The surgeon indicated the patient would be a poor candidate for conventional surgery. Based upon review of all of the patient's preoperative diagnostic tests they are felt to be candidate  for transcatheter aortic valve replacement using the transfemoral approach as an alternative to high risk conventional surgery.    Following the decision to proceed with transcatheter aortic valve replacement, a discussion has been held regarding what types of management strategies would be attempted intraoperatively in the event of life-threatening complications, including whether or not the patient would be considered a candidate for the use of cardiopulmonary bypass and/or conversion to open sternotomy for attempted surgical intervention.  The patient has been advised of a variety of complications that might develop peculiar to this approach including but not limited to risks of death, stroke, paravalvular leak, aortic dissection or other major vascular complications, aortic annulus rupture, device embolization, cardiac rupture or perforation, acute myocardial infarction, arrhythmia, heart block or bradycardia requiring permanent pacemaker placement, congestive heart failure, respiratory failure, renal failure, pneumonia, infection, other late complications related to structural valve deterioration or migration, or other complications that might ultimately cause a temporary or permanent loss of functional independence or other long term morbidity.  The patient provides full informed consent for the procedure as described and all questions were answered preoperatively.    DETAILS OF THE OPERATIVE PROCEDURE  PREPARATION:   The patient is brought to the operating room on the above mentioned date and central monitoring was established by the anesthesia team including placement of a radial arterial line. The patient is placed in the supine position on the operating table.  Intravenous antibiotics are administered. Conscious sedation is used.   Baseline transthoracic echocardiogram was performed. The patient's chest, abdomen, both groins, and both lower extremities are prepared and draped in a sterile  manner. A time out procedure is performed.   PERIPHERAL ACCESS:   Using the modified Seldinger technique, femoral arterial and venous access  were obtained with placement of a 6 Fr sheath in the artery and a 7 Fr sheath in the vein on the left side using u/s guidance.  A pigtail diagnostic catheter was passed through the femoral arterial sheath under fluoroscopic guidance into the aortic root.  A temporary transvenous pacemaker catheter was passed through the femoral venous sheath under fluoroscopic guidance into the right ventricle.  The pacemaker was tested to ensure stable lead placement and pacemaker capture. Aortic root angiography was performed in order to determine the optimal angiographic angle for valve deployment with the cusp overlap technique.   TRANSFEMORAL ACCESS:  A micropuncture kit was used to gain access to the right femoral artery using u/s guidance. Position confirmed with angiography. Pre-closure with double ProGlide closure devices. The patient was heparinized systemically and ACT verified > 250 seconds.    A 14 Fr transfemoral Dry Seal sheath was introduced into the right femoral artery over an Amplatz superstiff wire. An AL-1 catheter was used to direct a J wire across the native aortic valve into the left ventricle. This was exchanged out for a pigtail catheter and position was confirmed in the LV apex. Simultaneous LV and Ao pressures were recorded.  The pigtail catheter was then exchanged for a Safari wire in the LV apex. Balloon valvuloplasty performed with a 18 mm True balloon while rapid pacing.   TRANSCATHETER HEART VALVE DEPLOYMENT:  A Medtronic Evolut FX THV size 26 mm was prepared and per manufacturer's guidelines, and the proper orientation of the valve is confirmed on the delivery system. The 14 French sheath is removed and the valve delivery system is introduced into the descending aorta using the in-line sheath. The valve was then advanced across the aortic arch. The  valve was carefully positioned across the aortic valve annulus. Once final position of the valve has been confirmed with angiographic assessment in the cusp overlap view and in the LAO view, the valve is deployed with controlled rapid pacing. The valve is taken to 80% deployment and appropriate depth is confirmed. The valve is then released from each paddle. There is no valvular leak and no central aortic insufficiency.  The patient's hemodynamic recovery following valve deployment is good.  Echo demostrated acceptable post-procedural gradients, stable mitral valve function, and no AI.   PROCEDURE COMPLETION:  The sheath was then removed and closure devices were completed. Protamine  was administered once femoral arterial repair was complete. The temporary pacemaker, pigtail catheters and femoral sheaths were removed with a Mynx closure device placed in the artery and manual pressure used for venous hemostasis.    The patient tolerated the procedure well and is transported to the surgical intensive care in stable condition. There were no immediate intraoperative complications. All sponge instrument and needle counts are verified correct at completion of the operation.   No blood products were administered during the operation.  The patient received a total of 15 mL of intravenous contrast during the procedure.  LVEDP=7-10 mm Hg  Antoinette Batman MD 11/20/2023 5:29 PM

## 2023-11-20 NOTE — Interval H&P Note (Signed)
 History and Physical Interval Note:  11/20/2023 2:16 PM  [name]  has presented today for surgery, with the diagnosis of Severe Aortic Stenosis.  The various methods of treatment have been discussed with the patient and family. After consideration of risks, benefits and other options for treatment, the patient has consented to  Procedure(s): Transcatheter Aortic Valve Replacement, Transfemoral (N/A) ECHOCARDIOGRAM, TRANSTHORACIC (N/A) as a surgical intervention.  The patient's history has been reviewed, patient examined, no change in status, stable for surgery.  I have reviewed the patient's chart and labs.  Questions were answered to the patient's satisfaction.     Melene Sportsman

## 2023-11-20 NOTE — Op Note (Signed)
 HEART AND VASCULAR CENTER   MULTIDISCIPLINARY HEART VALVE TEAM     TAVR OPERATIVE NOTE   NAME@ [id number]  Date of Procedure:                 11/20/2023   Preoperative Diagnosis:      Severe Aortic Stenosis    Postoperative Diagnosis:    Same    Procedure:        Transcatheter Aortic Valve Replacement - Percutaneous right Transfemoral Approach             Medtronic Evolut FX  (size 26 mm)              Co-Surgeons:            Melene Sportsman, MD and Jere Monaco, MD     Anesthesiologist:                  Lelan Purpura, MD   Echocardiographer:              Janelle Mediate, MD   Pre-operative Echo Findings: Severe aortic stenosis  Normal left ventricular systolic function   Post-operative Echo Findings: no paravalvular leak Normal left ventricular systolic function      BRIEF CLINICAL NOTE AND INDICATIONS FOR SURGERY   83 yo female with NYHA class 2 symptoms of severe AS with normal LV function and no CAD. She is a candidate for AVR and secondary to her age would best be served with TAVR. She understands all the risks and goals of surgery and recovery. She would probably benefit from the medtronic valve due to a better valve area         DETAILS OF THE OPERATIVE PROCEDURE    PREPARATION:    The patient was brought to the operating room on the above mentioned date and appropriate monitoring was established by the anesthesia team. The patient was placed in the supine position on the operating table.  Intravenous antibiotics were administered. The patient was monitored closely throughout the procedure under conscious sedation. Baseline transthoracic echocardiogram was performed. The patient's abdomen and both groins were prepped and draped in a sterile manner. A time out procedure was performed.   PERIPHERAL ACCESS:    Using the modified Seldinger technique, femoral arterial and venous access was obtained with placement of 6 Fr sheaths on the left side.  A pigtail diagnostic  catheter was passed through the left arterial sheath under fluoroscopic guidance into the aortic root.  A temporary transvenous pacemaker catheter was passed through the left femoral venous sheath under fluoroscopic guidance into the right ventricle.  The pacemaker was tested to ensure stable lead placement and pacemaker capture. Aortic root angiography was performed in order to determine the optimal angiographic angle for valve deployment.    TRANSFEMORAL ACCESS:    Percutaneous transfemoral access and sheath placement was performed using ultrasound guidance.  The right common femoral artery was cannulated using a micropuncture needle.  A pair of Abbott Perclose percutaneous closure devices were placed and a 6 French sheath replaced into the femoral artery.  The patient was heparinized systemically and ACT verified > 250 seconds.     An 14 Fr transfemoral Gore Dry-Seal sheath was introduced into the right femoral artery after progressively dilating over an Amplatz superstiff wire. An AL-1 catheter was used to direct a straight-tip exchange length wire across the native aortic valve into the left ventricle. This was exchanged out for a pigtail catheter and position was confirmed in the  LV apex. Simultaneous LV and Ao pressures were recorded.  The pigtail catheter was exchanged for a Safari wire in the LV apex.    BALLOON AORTIC VALVULOPLASTY:    This was performed with an 18 fr tru balloon under rapid ventricular pacing and pt tolerated procedure well  The 14 F dry seal sheath was removed and the valve system inserted over the wire TRANSCATHETER HEART VALVE DEPLOYMENT:    A Medtronic Evolut FX transcatheter heart valve (size 26 mm) was prepared and loaded into the delivery catheter system per manufacturer's guidelines and the proper orientation of the valve is confirmed under fluoroscopy. The delivery system and inline sheath were inserted into the right common femoral artery over the [facility] wire and  the inline sheath advanced into the abdominal aorta under fluoroscopic guidance. The delivery catheter was advanced around the aortic arch and the valve was carefully positioned across the aortic valve annulus. An aortic root injection was performed to confirm position and the valve deployed using cusp overlap technique under fluoroscopic guidance. Intermittent pacing was used during valve deployment. The delivery system and guidewire were retracted into the descending aorta and the nosecone re-sheathed. Valve function was assessed using echocardiography. There is felt to be no paravalvular leak and no central aortic insufficiency. The patient's hemodynamic recovery following valve deployment is good.        PROCEDURE COMPLETION:    The delivery system and in-line sheath were removed and femoral artery closure performed using the Perclose devices.  Protamine  was administered once femoral arterial repair was complete. The temporary pacemaker, pigtail catheters and femoral sheaths were removed with manual pressure used venous for hemostasis and a Mynx closure device for contralateral arterial hemostasis.    The patient tolerated the procedure well and is transported to the cath lab recovery area in stable condition. There were no immediate intraoperative complications. All sponge instrument and needle counts are verified correct at completion of the operation.    No blood products were administered during the operation.   The patient received a total of 30 mL of intravenous contrast during the procedure.    Melene Sportsman, MD

## 2023-11-20 NOTE — Discharge Summary (Incomplete)
 HEART AND VASCULAR CENTER   MULTIDISCIPLINARY HEART VALVE TEAM  Discharge Summary    Patient ID: [name] MRN: [id number]; DOB: 07/02/1941  Admit date: 11/20/2023 Discharge date: 11/21/2023  PCP:  Imelda Man, MD  [facility] Cardiologist:  Antoinette Batman, MD  [facility] Structural heart: Antoinette Batman, MD [facility] Electrophysiologist:  None   Discharge Diagnoses    Principal Problem:   S/P TAVR (transcatheter aortic valve replacement) Active Problems:   COPD (chronic obstructive pulmonary disease) (HCC)   MCI (mild cognitive impairment)   Severe aortic stenosis   Hypothyroid   Hypertension   Allergies Allergies  Allergen Reactions   Brimonidine Itching    Red eyes   Naphazoline-Polyethyl Glycol     THAT YOU USE TO DILATE YOUR EYES.Aaron AasMAKES BP GO UP, CAN ONLY USE HALF STRENGTH   Tropicamide Other (See Comments)    Elevates IOP OK to use 0.5%    Diagnostic Studies/Procedures    HEART AND VASCULAR CENTER  TAVR OPERATIVE NOTE     Date of Procedure:                11/20/2023   Preoperative Diagnosis:      Severe Aortic Stenosis    Postoperative Diagnosis:    Same    Procedure:        Transcatheter Aortic Valve Replacement - Transfemoral Approach             Medtronic Evolut FX THV (size 26 mm, model # [id number] serial # [id number] )              Co-Surgeons:                        Antoinette Batman, MD and Melene Sportsman, MD    Anesthesiologist:                  Brain Cahill   Echocardiographer:              Stann Earnest   Pre-operative Echo Findings: Severe aortic stenosis Normal left ventricular systolic function   Post-operative Echo Findings: No paravalvular leak Normal left ventricular systolic function   _____________    Echo 11/21/23: completed but pending formal read at the time of discharge   History of Present Illness     [name] is a 83 y.o. female with a history of oral cancer, COPD, HTN, HLD, hypothyroidism,  mild memory loss, psoriasis and severe aortic stenosis who presented to [facility] on 11/20/23 for planned TAVR.   She was diagnosed with a murmur by primary care. Outside echo showed severe AS. She was referred to Dr. Abel Hoe for consultation. Echo in our office 08/29/23 showed severe AS with EF 70%, mean grad 46 mmHg, AVA 0.64 cm2. [facility] 09/12/23 showed mild non-obstructive CAD and normal right heart pressures.  The patient was evaluated by the multidisciplinary valve team and felt to have severe, symptomatic aortic stenosis and to be a suitable candidate for TAVR, which was set up for 11/20/22.   Hospital Course     Consultants: none   Severe AS:  -- S/p successful TAVR with a 26 mm Evolut FX THV via the TF approach on 11/20/23.  -- Post operative echo completed but pending formal read. -- Groin sites are stable.  -- ECG with sinus and no high grade heart block. -- Started on a baby Asprin 81mg  daily.  -- Met with cardiac rehab to discuss CRP phase II.  -- Plan for  discharge home today with close follow up in the outpatient setting.   HTN: -- BP elevated in the setting of holding home antihypertensives. -- Resume home Toprol XL 100mg  daily & Diovan-HCT 160-12.5 mg daily. -- Will follow in the outpatient setting.   Abnormal lung CT:  -- Pre TAVR CT showed mild Tree-in-bud appearing infiltrates within the left lung base and superior segment of the right upper lobe, likely infectious or inflammatory in etiology.  -- She had no s/s of PNA.  Pulmonary nodule: -- Pre TAVR CT showed mild lingular atelectasis with an adjacent 6 mm pulmonary nodule. Non-contrast chest CT at 3-6 months is recommended. If the nodules are stable at time of repeat CT, then future CT at 18-24 months (from today's scan) is considered optional for low-risk patients, but is recommended for high-risk patients.  -- With smoking history, I will send her to the pulmonary nodule clinic through pulmonology. -- This will be discussed  in the outpatient setting.  _____________  Discharge Vitals Blood pressure (!) 174/99, pulse 91, temperature (!) 97.3 F (36.3 C), temperature source Oral, resp. rate 20, height 5\' 6"  (1.676 m), weight 59.1 kg, SpO2 96%.  Filed Weights   11/20/23 1323 11/21/23 0444  Weight: 59 kg 59.1 kg     GEN: Well nourished, well developed in no acute distress NECK: No JVD CARDIAC: RRR, soft flow murmur @ RUSB. No rubs, gallops RESPIRATORY:  Clear to auscultation without rales, wheezing or rhonchi  ABDOMEN: Soft, non-tender, non-distended EXTREMITIES:  No edema; No deformity.  Groin sites clear without hematoma or ecchymosis.    Disposition   Pt is being discharged home today in good condition.  Follow-up Plans & Appointments     Follow-up Information     Ardia Kraft, PA-C. Go on 11/29/2023.   Specialties: Cardiology, Radiology Why: @ 3:35PM. PLEASE NOTE THIS ADDRESS IS INCORRECT. PLEASE COME TO OUR NEW OFFICE AT [address] AT LEAST 20 MINUTES EARLY DUE TO DELAYS RELATED TO THIS BEING OUR FIRST WEEK IN THE OFFICE. Contact information: [address] [place] [address] [phone]         Imelda Man, MD. Call in 1 week(s).   Specialty: Internal Medicine Contact information: [address] [place] [address] [place] [address] [phone]                  Discharge Medications   Allergies as of 11/21/2023       Reactions   Brimonidine Itching   Red eyes   Naphazoline-polyethyl Glycol    THAT YOU USE TO DILATE YOUR EYES.Aaron AasMAKES BP GO UP, CAN ONLY USE HALF STRENGTH   Tropicamide Other (See Comments)   Elevates IOP OK to use 0.5%        Medication List     TAKE these medications    aspirin  81 MG chewable tablet Chew 1 tablet (81 mg total) by mouth daily. Start taking on: November 22, 2023   CALTRATE 600 PO Take 1 tablet by mouth daily after lunch.   dorzolamide -timolol  2-0.5 % ophthalmic solution Commonly known as:  COSOPT  Place 1 drop into the left eye every 12 (twelve) hours.   levothyroxine  75 MCG tablet Commonly known as: SYNTHROID  Take 75 mcg by mouth daily after lunch.   Lumigan 0.01 % Soln Generic drug: bimatoprost Place 1 drop into the left eye at bedtime.   memantine  5 MG tablet Commonly known as: NAMENDA  TAKE 1 TABLET BY MOUTH EVERYDAY AT BEDTIME  metoprolol succinate 100 MG 24 hr tablet Commonly known as: TOPROL-XL Take 100 mg by mouth daily after lunch.   multivitamin with minerals Tabs tablet Take 1 tablet by mouth daily after lunch. Centrum Silver   prednisoLONE  acetate 1 % ophthalmic suspension Commonly known as: PRED FORTE  Place 1 drop into the right eye daily with breakfast.   rosuvastatin  5 MG tablet Commonly known as: CRESTOR  Take 5 mg by mouth daily after lunch.   valsartan-hydrochlorothiazide 160-12.5 MG tablet Commonly known as: DIOVAN-HCT Take 1 tablet by mouth daily after lunch.   Vitamin D3 10 MCG (400 UNIT) tablet Take 400 Units by mouth daily after lunch.         Outstanding Labs/Studies   none  ______________________  Duration of Discharge Encounter: APP Time: 20 minutes    Signed, [name], PA-C 11/21/2023, 11:26 AM [phone]  I have personally seen and examined this patient. I agree with the assessment and plan as outlined above.  83 yo female one day post TAVR. She is doing well. No events. No complaints.  I personally reviewed her labs, EKG (no high grade AV block, telemetry with sinus, echo with no PVL.  My exam: NAD, RRR, lungs clear, groins without hematoma Plan: d/c home today. Follow up one week.   I have spent 25 minutes reviewing the chart, notes, labs, vitals, EKG, echo images, examining the patient and formulating a plan for d/c.   Antoinette Batman, MD, [facility] 11/21/2023 9:59 PM

## 2023-11-20 NOTE — Anesthesia Preprocedure Evaluation (Addendum)
 Anesthesia Evaluation  Patient identified by MRN, date of birth, ID band Patient awake    Reviewed: Allergy & Precautions, H&P , NPO status , Patient's Chart, lab work & pertinent test results  Airway Mallampati: II  TM Distance: >3 FB Neck ROM: Full    Dental no notable dental hx.    Pulmonary COPD, former smoker   Pulmonary exam normal breath sounds clear to auscultation       Cardiovascular hypertension, + Valvular Problems/Murmurs AS  Rhythm:Regular Rate:Normal + Systolic murmurs IMPRESSIONS     1. The aortic valve is abnormal. There is severe calcifcation of the  aortic valve. Aortic valve regurgitation is not visualized. Severe aortic  valve stenosis. Aortic valve area, by VTI measures 0.64 cm. Aortic valve  mean gradient measures 46.0 mmHg.  Aortic valve Vmax measures 4.34 m/s. Aortic valve acceleration time  measures 113 msec.   2. Left ventricular ejection fraction, by estimation, is 70 to 75%. The  left ventricle has hyperdynamic function. The left ventricle has no  regional wall motion abnormalities. There is mild left ventricular  hypertrophy. Left ventricular diastolic  parameters are consistent with Grade I diastolic dysfunction (impaired  relaxation).   3. Right ventricular systolic function is normal. The right ventricular  size is normal. There is normal pulmonary artery systolic pressure. The  estimated right ventricular systolic pressure is 22.5 mmHg.   4. The mitral valve is degenerative. No evidence of mitral valve  regurgitation. No evidence of mitral stenosis. The mean mitral valve  gradient is 2.1 mmHg with average heart rate of 57 bpm.   5. The inferior vena cava is normal in size with greater than 50%  respiratory variability, suggesting right atrial pressure of 3 mmHg.     Neuro/Psych Cognitive impairment   negative psych ROS   GI/Hepatic negative GI ROS, Neg liver ROS,,,  Endo/Other   Hypothyroidism    Renal/GU negative Renal ROS  negative genitourinary   Musculoskeletal negative musculoskeletal ROS (+)    Abdominal   Peds negative pediatric ROS (+)  Hematology negative hematology ROS (+)   Anesthesia Other Findings   Reproductive/Obstetrics negative OB ROS                              Anesthesia Physical Anesthesia Plan  ASA: 4  Anesthesia Plan: MAC   Post-op Pain Management:    Induction: Intravenous  PONV Risk Score and Plan: 3 and Ondansetron , Treatment may vary due to age or medical condition and TIVA  Airway Management Planned: Natural Airway and Simple Face Mask  Additional Equipment: Arterial line  Intra-op Plan:   Post-operative Plan:   Informed Consent: I have reviewed the patients History and Physical, chart, labs and discussed the procedure including the risks, benefits and alternatives for the proposed anesthesia with the patient or authorized representative who has indicated his/her understanding and acceptance.     Dental advisory given  Plan Discussed with: CRNA  Anesthesia Plan Comments:          Anesthesia Quick Evaluation

## 2023-11-20 NOTE — Transfer of Care (Signed)
 Immediate Anesthesia Transfer of Care Note  Patient: [name]  Procedure(s) Performed: Transcatheter Aortic Valve Replacement, Transfemoral ECHOCARDIOGRAM, TRANSTHORACIC  Patient Location: PACU  Anesthesia Type:MAC  Level of Consciousness: drowsy, patient cooperative, and responds to stimulation  Airway & Oxygen Therapy: Patient Spontanous Breathing and Patient connected to nasal cannula oxygen  Post-op Assessment: Report given to RN, Post -op Vital signs reviewed and stable, and Patient moving all extremities X 4  Post vital signs: Reviewed and stable  Last Vitals:  Vitals Value Taken Time  BP 114/60 11/20/23 1730  Temp    Pulse 75 11/20/23 1731  Resp 19 11/20/23 1731  SpO2 98 % 11/20/23 1731  Vitals shown include unfiled device data.  Last Pain:  Vitals:   11/20/23 1345  TempSrc:   PainSc: 0-No pain         Complications: There were no known notable events for this encounter.

## 2023-11-21 ENCOUNTER — Inpatient Hospital Stay (HOSPITAL_COMMUNITY)

## 2023-11-21 ENCOUNTER — Encounter (HOSPITAL_COMMUNITY): Payer: Self-pay | Admitting: Cardiovascular Disease

## 2023-11-21 ENCOUNTER — Other Ambulatory Visit: Payer: Self-pay | Admitting: Physician Assistant

## 2023-11-21 DIAGNOSIS — Z952 Presence of prosthetic heart valve: Secondary | ICD-10-CM | POA: Diagnosis not present

## 2023-11-21 LAB — ECHOCARDIOGRAM COMPLETE
AR max vel: 2.13 cm2
AV Area VTI: 1.88 cm2
AV Area mean vel: 1.85 cm2
AV Mean grad: 10 mmHg
AV Peak grad: 17.5 mmHg
Ao pk vel: 2.09 m/s
Area-P 1/2: 4.63 cm2
Calc EF: 74.1 %
Height: 66 in
MV VTI: 2.12 cm2
S' Lateral: 2.1 cm
Single Plane A2C EF: 64.4 %
Single Plane A4C EF: 80 %
Weight: 2084.67 [oz_av]

## 2023-11-21 LAB — BASIC METABOLIC PANEL WITH GFR
Anion gap: 10 (ref 5–15)
BUN: 23 mg/dL (ref 8–23)
CO2: 22 mmol/L (ref 22–32)
Calcium: 8.7 mg/dL — ABNORMAL LOW (ref 8.9–10.3)
Chloride: 101 mmol/L (ref 98–111)
Creatinine, Ser: 1.03 mg/dL — ABNORMAL HIGH (ref 0.44–1.00)
GFR, Estimated: 54 mL/min — ABNORMAL LOW (ref >60)
Glucose, Bld: 81 mg/dL (ref 70–99)
Potassium: 3.9 mmol/L (ref 3.5–5.1)
Sodium: 133 mmol/L — ABNORMAL LOW (ref 135–145)

## 2023-11-21 LAB — CBC
HCT: 32.7 % — ABNORMAL LOW (ref 36.0–46.0)
Hemoglobin: 11 g/dL — ABNORMAL LOW (ref 12.0–15.0)
MCH: 30.2 pg (ref 26.0–34.0)
MCHC: 33.6 g/dL (ref 30.0–36.0)
MCV: 89.8 fL (ref 80.0–100.0)
Platelets: 196 10*3/uL (ref 150–400)
RBC: 3.64 MIL/uL — ABNORMAL LOW (ref 3.87–5.11)
RDW: 12.5 % (ref 11.5–15.5)
WBC: 8.6 10*3/uL (ref 4.0–10.5)
nRBC: 0 % (ref 0.0–0.2)

## 2023-11-21 LAB — MAGNESIUM: Magnesium: 2 mg/dL (ref 1.7–2.4)

## 2023-11-21 LAB — POCT ACTIVATED CLOTTING TIME: Activated Clotting Time: 280 s

## 2023-11-21 MED ORDER — ASPIRIN 81 MG PO CHEW: 81.0000 mg | CHEWABLE_TABLET | Freq: Every day | ORAL | Status: DC

## 2023-11-21 MED ORDER — ORAL CARE MOUTH RINSE: 15.0000 mL | OROMUCOSAL | Status: DC | PRN

## 2023-11-21 NOTE — TOC Transition Note (Signed)
 Transition of Care [facility]) - Discharge Note   Patient Details  Name: [name] MRN: [id number] Date of Birth: [birth year]  Transition of Care [facility]) CM/SW Contact:  Jennett Model, RN Phone Number: 11/21/2023, 10:24 AM   Clinical Narrative:     For possible dc today, has no needs. Has transportation.        Patient Goals and CMS Choice            Discharge Placement                       Discharge Plan and Services Additional resources added to the After Visit Summary for                                       Social Drivers of Health (SDOH) Interventions SDOH Screenings   Tobacco Use: Medium Risk (11/20/2023)     Readmission Risk Interventions    11/21/2023   10:23 AM  Readmission Risk Prevention Plan  Post Dischage Appt Complete  Medication Screening Complete  Transportation Screening Complete

## 2023-11-21 NOTE — Plan of Care (Signed)
  Problem: Education: Goal: Knowledge of General Education information will improve Description: Including pain rating scale, medication(s)/side effects and non-pharmacologic comfort measures Outcome: Adequate for Discharge   Problem: Health Behavior/Discharge Planning: Goal: Ability to manage health-related needs will improve Outcome: Adequate for Discharge   Problem: Clinical Measurements: Goal: Ability to maintain clinical measurements within normal limits will improve Outcome: Adequate for Discharge Goal: Will remain free from infection Outcome: Adequate for Discharge Goal: Diagnostic test results will improve Outcome: Adequate for Discharge Goal: Respiratory complications will improve Outcome: Adequate for Discharge Goal: Cardiovascular complication will be avoided Outcome: Adequate for Discharge   Problem: Activity: Goal: Risk for activity intolerance will decrease Outcome: Adequate for Discharge   Problem: Coping: Goal: Level of anxiety will decrease Outcome: Adequate for Discharge   Problem: Elimination: Goal: Will not experience complications related to bowel motility Outcome: Adequate for Discharge Goal: Will not experience complications related to urinary retention Outcome: Adequate for Discharge   Problem: Pain Managment: Goal: General experience of comfort will improve and/or be controlled Outcome: Adequate for Discharge   Problem: Safety: Goal: Ability to remain free from injury will improve Outcome: Adequate for Discharge   Problem: Skin Integrity: Goal: Risk for impaired skin integrity will decrease Outcome: Adequate for Discharge

## 2023-11-21 NOTE — Progress Notes (Signed)
  Echocardiogram 2D Echocardiogram has been performed.  Ladarren Steiner L Lya Holben RDCS 11/21/2023, 9:44 AM

## 2023-11-21 NOTE — TOC CM/SW Note (Signed)
 Transition of Care [facility]) - Inpatient Brief Assessment   Patient Details  Name: [name] MRN: [id number] Date of Birth: 12/16/1940  Transition of Care [facility]) CM/SW Contact:    Jennett Model, RN Phone Number: 11/21/2023, 10:23 AM   Clinical Narrative: From home with daughter, has PCP and insurance on file, states has no HH services in place at this time or DME at home.  States family member will transport them home at [employer] and family is support system, states gets medications from [facility] on [address].  Pta self ambulatory .   Transition of Care Asessment: Insurance and Status: Insurance coverage has been reviewed Patient has primary care physician: Yes Home environment has been reviewed: home with daughter Prior level of function:: indep Prior/Current Home Services: No current home services Social Drivers of Health Review: SDOH reviewed no interventions necessary Readmission risk has been reviewed: Yes Transition of care needs: no transition of care needs at this time

## 2023-11-21 NOTE — Progress Notes (Signed)
 Discussed with pt and son restrictions, walking for exercise, and CRPII. Son and pt voiced reception. CRPII would be too much for her daughter to manage due to pt's dementia and not able to drive herself. Will not refer. MT to see soon. [phone] German Koller BS, ACSM-CEP 11/21/2023 9:15 AM

## 2023-11-21 NOTE — Anesthesia Postprocedure Evaluation (Signed)
 Anesthesia Post Note  Patient: [name]  Procedure(s) Performed: Transcatheter Aortic Valve Replacement, Transfemoral ECHOCARDIOGRAM, TRANSTHORACIC     Patient location during evaluation: Cath Lab Anesthesia Type: MAC Level of consciousness: patient cooperative Pain management: pain level controlled Vital Signs Assessment: post-procedure vital signs reviewed and stable Respiratory status: spontaneous breathing, nonlabored ventilation, respiratory function stable and patient connected to nasal cannula oxygen Cardiovascular status: stable and blood pressure returned to baseline Postop Assessment: no apparent nausea or vomiting Anesthetic complications: no   There were no known notable events for this encounter.                  Cruz Devilla

## 2023-11-22 ENCOUNTER — Telehealth: Payer: Self-pay | Admitting: Physician Assistant

## 2023-11-22 NOTE — Telephone Encounter (Signed)
  HEART AND VASCULAR CENTER   MULTIDISCIPLINARY HEART VALVE TEAM   Patient contacted regarding discharge from [facility] on 11/21/23.  Patient understands to follow up with a structural heart APP on 11/29/23 at [address] [facility].  Patient understands discharge instructions? yes Patient understands medications and regimen? yes Patient understands to bring all medications to this visit? Yes  Spoke to daughter [name]. She has been weak and fatigued and having lightheadedness and seeing floaters. No focal neurologic deficits. She was noted to have a low LVEDP at the time of TAVR and easily collapsible veins. She was treated with post procedure IVFs.   I think she is still dehydrated. I have asked her to hold her Diovan-HCT until she sees me in the office and to push fluids. We went over ER precautions. Otherwise, I will see her in follow up in the office next week as planned.   [name] PA-C  [facility]

## 2023-11-29 ENCOUNTER — Encounter: Payer: Self-pay | Admitting: Physician Assistant

## 2023-11-29 ENCOUNTER — Ambulatory Visit: Attending: Physician Assistant | Admitting: Physician Assistant

## 2023-11-29 VITALS — BP 128/62 | HR 63 | Ht 66.0 in | Wt 132.6 lb

## 2023-11-29 DIAGNOSIS — I10 Essential (primary) hypertension: Secondary | ICD-10-CM | POA: Diagnosis not present

## 2023-11-29 DIAGNOSIS — R053 Chronic cough: Secondary | ICD-10-CM

## 2023-11-29 DIAGNOSIS — Z952 Presence of prosthetic heart valve: Secondary | ICD-10-CM | POA: Diagnosis not present

## 2023-11-29 DIAGNOSIS — R911 Solitary pulmonary nodule: Secondary | ICD-10-CM

## 2023-11-29 DIAGNOSIS — R9389 Abnormal findings on diagnostic imaging of other specified body structures: Secondary | ICD-10-CM

## 2023-11-29 MED ORDER — AMOXICILLIN 500 MG PO TABS: 2000.0000 mg | ORAL_TABLET | ORAL | 12 refills | Status: DC

## 2023-11-29 NOTE — Patient Instructions (Signed)
 Medication Instructions:  Your physician recommends that you continue on your current medications as directed. Please refer to the Current Medication list given to you today.  *If you need a refill on your cardiac medications before your next appointment, please call your pharmacy*  Lab Work: NONE If you have labs (blood work) drawn today and your tests are completely normal, you will receive your results only by: MyChart Message (if you have MyChart) OR A paper copy in the mail If you have any lab test that is abnormal or we need to change your treatment, we will call you to review the results.  Testing/Procedures: NONE  Follow-Up: At [facility], you and your health needs are our priority.  As part of our continuing mission to provide you with exceptional heart care, our providers are all part of one team.  This team includes your primary Cardiologist (physician) and Advanced Practice Providers or APPs (Physician Assistants and Nurse Practitioners) who all work together to provide you with the care you need, when you need it.  Your next appointment:   KEEP SCHEDULED FOLLOW-UP   We recommend signing up for the patient portal called "MyChart".  Sign up information is provided on this After Visit Summary.  MyChart is used to connect with patients for Virtual Visits (Telemedicine).  Patients are able to view lab/test results, encounter notes, upcoming appointments, etc.  Non-urgent messages can be sent to your provider as well.   To learn more about what you can do with MyChart, go to [email or web address].   Other Instructions YOU HAVE BEEN REFEREED TO SEE [facility]

## 2023-11-29 NOTE — Progress Notes (Signed)
 HEART AND VASCULAR CENTER   MULTIDISCIPLINARY HEART VALVE CLINIC                                     Cardiology Office Note:    Date:  11/30/2023   ID:  ARIBA DEJARNETTE, DOB 1940-08-08, MRN [id number]  PCP:  Imelda Man, MD  [facility] Cardiologist:  Antoinette Batman, MD  [facility] Structural heart: Antoinette Batman, MD [facility] Electrophysiologist:  None   Referring MD: Imelda Man, MD   [facility] s/p TAVR  History of Present Illness:    [name] is a 83 y.o. 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   She was diagnosed with a murmur by primary care. Outside echo showed severe AS. She was referred to Dr. Abel Hoe for consultation. Echo in our office 08/29/23 showed severe AS with EF 70%, mean grad 46 mmHg, AVA 0.64 cm2. [facility] 09/12/23 showed mild non-obstructive CAD and normal right heart pressures. S/p TAVR  with a 26 mm Evolut FX THV via the TF approach on 11/20/23. Post operative echo showed EF 70%, normally functioning TAVR with a mean gradient of 10 mmHg and no PVL. Started on a baby aspirin  81mg  daily.   Pt's daughter called in reporting that pt was weak and fatigued and having lightheadedness and seeing floaters. I asked her to hold her Diovan-HCT due to concerns for dehydration.   Today the patient presents to clinic for follow up. Here with two sons. Both live out of state. The patient reports doing quite well. No CP or SOB. No LE edema, orthopnea or PND. No dizziness or syncope. No blood in stool or urine. No palpitations. She does have a chronic cough and mucus production. 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 Abel Hoe & Dr. Honey Lusty   Scoliosis    Severe aortic stenosis    Wears glasses      Current Medications: Current Meds  Medication Sig   amoxicillin  (AMOXIL ) 500 MG tablet Take 4 tablets (2,000 mg total) by mouth as directed. 1 hour prior to dental work including cleanings   aspirin  81 MG chewable tablet Chew 1 tablet (81 mg total) by mouth daily.   Calcium  Carbonate (CALTRATE 600 PO) Take 1 tablet by mouth daily after lunch.   Cholecalciferol (VITAMIN D3) 10 MCG (400 UNIT) tablet Take 400 Units by mouth daily after lunch.   dorzolamide -timolol  (COSOPT ) 22.3-6.8 MG/ML ophthalmic solution Place 1 drop into the left eye every 12 (twelve) hours.   levothyroxine  (SYNTHROID ) 75 MCG tablet Take 75 mcg by mouth daily after lunch.   LUMIGAN 0.01 % SOLN Place 1 drop into the left eye at bedtime.   memantine  (NAMENDA ) 5 MG tablet TAKE 1 TABLET  BY MOUTH EVERYDAY AT BEDTIME   metoprolol succinate (TOPROL-XL) 100 MG 24 hr tablet Take 100 mg by mouth daily after lunch.   Multiple Vitamin (MULTIVITAMIN WITH MINERALS) TABS tablet Take 1 tablet by mouth daily after lunch. Centrum Silver   prednisoLONE  acetate (PRED FORTE ) 1 % ophthalmic suspension Place 1 drop into the right eye daily with breakfast.   rosuvastatin  (CRESTOR ) 5 MG tablet Take 5 mg by mouth daily after lunch.   [DISCONTINUED] valsartan-hydrochlorothiazide (DIOVAN-HCT) 160-12.5 MG tablet Take 1 tablet by mouth daily after lunch.      ROS:   Please see the history of present illness.    All other systems reviewed and are negative.  EKGs   EKG Interpretation Date/Time:  Thursday Nov 29 2023 15:49:50 EDT Ventricular Rate:  63 PR Interval:  178 QRS Duration:  78 QT Interval:  398 QTC Calculation: 407 R  Axis:   46  Text Interpretation: Normal sinus rhythm Possible Left atrial enlargement Low voltage QRS QRS voltage has decreased Confirmed by [name] [phone]) on 11/29/2023 4:06:57 PM   Risk Assessment/Calculations:           Physical Exam:    VS:  BP 128/62   Pulse 63   Ht 5\' 6"  (1.676 m)   Wt 132 lb 9.6 oz (60.1 kg)   SpO2 98%   BMI 21.40 kg/m     Wt Readings from Last 3 Encounters:  11/29/23 132 lb 9.6 oz (60.1 kg)  11/21/23 130 lb 4.7 oz (59.1 kg)  11/08/23 135 lb (61.2 kg)     GEN: Well nourished, well developed in no acute distress NECK: No JVD CARDIAC: RRR, soft flow murmur. No rubs, gallops RESPIRATORY:  diffuse rhonchi and mild wheeze ABDOMEN: Soft, non-tender, non-distended EXTREMITIES:  No edema; No deformity.  Groin sites clear without hematoma or ecchymosis.   ASSESSMENT:    1. S/P TAVR (transcatheter aortic valve replacement)   2. Hypertension, unspecified type   3. Pulmonary nodule   4. Abnormal chest CT     PLAN:    In order of problems listed above:  Severe AS s/p TAVR:  -- Pt doing well s/p TAVR.  -- ECG with no HAVB.  -- Groin sites healing well.  -- SBE prophylaxis discussed; I have RX'd amoxicillin . .  -- Continue Aspirin  81mg  daily. -- Cleared to resume all activities without restriction. -- I will see back for 1 month echo and OV.  HTN: -- BP well controlled today.  -- Continue Toprol XL 100mg  daily.   -- Diovan-HCT 160-12.5 mg recently held in the setting of dehydration and weakness.  -- BP well controlled today, so will keep her off this for now.  -- Sons will purchase a BP cuff to periodically check BP at home.    Abnormal lung CT:  -- Pre TAVR CT showed mild Tree-in-bud appearing infiltrates within the left lung base and superior segment of the right upper lobe, likely infectious or inflammatory in etiology.  -- She had no s/s of PNA.   Pulmonary nodule: -- Pre TAVR CT showed mild lingular atelectasis with an adjacent  6 mm pulmonary nodule. Non-contrast chest CT at 3-6 months is recommended. If the nodules are stable at time of repeat CT, then future CT at 18-24 months (from today's scan) is considered optional for low-risk patients, but is recommended for high-risk patients.  -- With smoking/factory work place exposure history, chronic cough and pulmonary nodules, I will refer her to pulmonary.  Medication Adjustments/Labs and Tests Ordered: Current medicines are reviewed at length with the patient today.  Concerns regarding medicines are outlined above.  Orders Placed This Encounter  Procedures   Ambulatory referral to Pulmonology   EKG 12-Lead   Meds ordered this encounter  Medications   amoxicillin  (AMOXIL ) 500 MG tablet    Sig: Take 4 tablets (2,000 mg total) by mouth as directed. 1 hour prior to dental work including cleanings    Dispense:  12 tablet    Refill:  12    Supervising Provider:   Arnoldo Lapping [[id number]]    Patient Instructions  Medication Instructions:  Your physician recommends that you continue on your current medications as directed. Please refer to the Current Medication list given to you today.  *If you need a refill on your cardiac medications before your next appointment, please call your pharmacy*  Lab Work: NONE If you have labs (blood work) drawn today and your tests are completely normal, you will receive your results only by: MyChart Message (if you have MyChart) OR A paper copy in the mail If you have any lab test that is abnormal or we need to change your treatment, we will call you to review the results.  Testing/Procedures: NONE  Follow-Up: At [facility], you and your health needs are our priority.  As part of our continuing mission to provide you with exceptional heart care, our providers are all part of one team.  This team includes your primary Cardiologist (physician) and Advanced Practice Providers or APPs (Physician Assistants and Nurse  Practitioners) who all work together to provide you with the care you need, when you need it.  Your next appointment:   KEEP SCHEDULED FOLLOW-UP   We recommend signing up for the patient portal called "MyChart".  Sign up information is provided on this After Visit Summary.  MyChart is used to connect with patients for Virtual Visits (Telemedicine).  Patients are able to view lab/test results, encounter notes, upcoming appointments, etc.  Non-urgent messages can be sent to your provider as well.   To learn more about what you can do with MyChart, go to [email or web address].   Other Instructions YOU HAVE BEEN REFEREED TO SEE [facility] PULMONOLOGY        Signed, [name], PA-C  11/30/2023 5:38 AM    [facility]

## 2023-12-05 ENCOUNTER — Ambulatory Visit: Payer: Medicare HMO | Admitting: Adult Health

## 2023-12-18 ENCOUNTER — Other Ambulatory Visit: Payer: Self-pay

## 2023-12-18 ENCOUNTER — Encounter (HOSPITAL_COMMUNITY): Payer: Self-pay

## 2023-12-18 ENCOUNTER — Emergency Department (HOSPITAL_COMMUNITY)

## 2023-12-18 ENCOUNTER — Inpatient Hospital Stay (HOSPITAL_COMMUNITY)
Admission: EM | Admit: 2023-12-18 | Discharge: 2023-12-27 | DRG: 982 | Disposition: A | Discharge status: Rehabilitation Facility | Attending: Student | Admitting: Student

## 2023-12-18 DIAGNOSIS — G309 Alzheimer's disease, unspecified: Secondary | ICD-10-CM | POA: Diagnosis present

## 2023-12-18 DIAGNOSIS — H5461 Unqualified visual loss, right eye, normal vision left eye: Secondary | ICD-10-CM | POA: Insufficient documentation

## 2023-12-18 DIAGNOSIS — W06XXXA Fall from bed, initial encounter: Secondary | ICD-10-CM | POA: Diagnosis present

## 2023-12-18 DIAGNOSIS — R4189 Other symptoms and signs involving cognitive functions and awareness: Secondary | ICD-10-CM | POA: Diagnosis present

## 2023-12-18 DIAGNOSIS — Y92239 Unspecified place in hospital as the place of occurrence of the external cause: Secondary | ICD-10-CM | POA: Diagnosis present

## 2023-12-18 DIAGNOSIS — R000 Tachycardia, unspecified: Secondary | ICD-10-CM | POA: Diagnosis not present

## 2023-12-18 DIAGNOSIS — I6389 Other cerebral infarction: Principal | ICD-10-CM | POA: Diagnosis present

## 2023-12-18 DIAGNOSIS — Z01810 Encounter for preprocedural cardiovascular examination: Secondary | ICD-10-CM

## 2023-12-18 DIAGNOSIS — R933 Abnormal findings on diagnostic imaging of other parts of digestive tract: Secondary | ICD-10-CM

## 2023-12-18 DIAGNOSIS — K5900 Constipation, unspecified: Secondary | ICD-10-CM | POA: Diagnosis not present

## 2023-12-18 DIAGNOSIS — S72011A Unspecified intracapsular fracture of right femur, initial encounter for closed fracture: Secondary | ICD-10-CM | POA: Insufficient documentation

## 2023-12-18 DIAGNOSIS — I10 Essential (primary) hypertension: Secondary | ICD-10-CM | POA: Diagnosis present

## 2023-12-18 DIAGNOSIS — K921 Melena: Secondary | ICD-10-CM

## 2023-12-18 DIAGNOSIS — R911 Solitary pulmonary nodule: Secondary | ICD-10-CM | POA: Diagnosis present

## 2023-12-18 DIAGNOSIS — F01A0 Vascular dementia, mild, without behavioral disturbance, psychotic disturbance, mood disturbance, and anxiety: Secondary | ICD-10-CM | POA: Diagnosis present

## 2023-12-18 DIAGNOSIS — Z85819 Personal history of malignant neoplasm of unspecified site of lip, oral cavity, and pharynx: Secondary | ICD-10-CM

## 2023-12-18 DIAGNOSIS — Z604 Social exclusion and rejection: Secondary | ICD-10-CM | POA: Diagnosis present

## 2023-12-18 DIAGNOSIS — R4182 Altered mental status, unspecified: Principal | ICD-10-CM

## 2023-12-18 DIAGNOSIS — H409 Unspecified glaucoma: Secondary | ICD-10-CM | POA: Diagnosis present

## 2023-12-18 DIAGNOSIS — I2510 Atherosclerotic heart disease of native coronary artery without angina pectoris: Secondary | ICD-10-CM | POA: Diagnosis present

## 2023-12-18 DIAGNOSIS — Z888 Allergy status to other drugs, medicaments and biological substances status: Secondary | ICD-10-CM

## 2023-12-18 DIAGNOSIS — Z79899 Other long term (current) drug therapy: Secondary | ICD-10-CM

## 2023-12-18 DIAGNOSIS — Z79890 Hormone replacement therapy (postmenopausal): Secondary | ICD-10-CM

## 2023-12-18 DIAGNOSIS — E222 Syndrome of inappropriate secretion of antidiuretic hormone: Secondary | ICD-10-CM | POA: Diagnosis present

## 2023-12-18 DIAGNOSIS — Z87891 Personal history of nicotine dependence: Secondary | ICD-10-CM

## 2023-12-18 DIAGNOSIS — R1310 Dysphagia, unspecified: Secondary | ICD-10-CM | POA: Diagnosis present

## 2023-12-18 DIAGNOSIS — I639 Cerebral infarction, unspecified: Secondary | ICD-10-CM | POA: Diagnosis not present

## 2023-12-18 DIAGNOSIS — Z8349 Family history of other endocrine, nutritional and metabolic diseases: Secondary | ICD-10-CM

## 2023-12-18 DIAGNOSIS — F02A0 Dementia in other diseases classified elsewhere, mild, without behavioral disturbance, psychotic disturbance, mood disturbance, and anxiety: Secondary | ICD-10-CM | POA: Diagnosis present

## 2023-12-18 DIAGNOSIS — D62 Acute posthemorrhagic anemia: Secondary | ICD-10-CM

## 2023-12-18 DIAGNOSIS — R262 Difficulty in walking, not elsewhere classified: Secondary | ICD-10-CM | POA: Diagnosis present

## 2023-12-18 DIAGNOSIS — J449 Chronic obstructive pulmonary disease, unspecified: Secondary | ICD-10-CM | POA: Diagnosis present

## 2023-12-18 DIAGNOSIS — Z952 Presence of prosthetic heart valve: Secondary | ICD-10-CM

## 2023-12-18 DIAGNOSIS — E039 Hypothyroidism, unspecified: Secondary | ICD-10-CM | POA: Diagnosis present

## 2023-12-18 DIAGNOSIS — Z953 Presence of xenogenic heart valve: Secondary | ICD-10-CM

## 2023-12-18 DIAGNOSIS — E785 Hyperlipidemia, unspecified: Secondary | ICD-10-CM | POA: Diagnosis present

## 2023-12-18 DIAGNOSIS — K5289 Other specified noninfective gastroenteritis and colitis: Secondary | ICD-10-CM | POA: Diagnosis not present

## 2023-12-18 DIAGNOSIS — R29702 NIHSS score 2: Secondary | ICD-10-CM | POA: Diagnosis present

## 2023-12-18 DIAGNOSIS — Z7982 Long term (current) use of aspirin: Secondary | ICD-10-CM

## 2023-12-18 LAB — BLOOD GAS, VENOUS
Acid-base deficit: 2.2 mmol/L — ABNORMAL HIGH (ref 0.0–2.0)
Bicarbonate: 22.4 mmol/L (ref 20.0–28.0)
Drawn by: 72855
O2 Saturation: 55.5 %
Patient temperature: 36.6
pCO2, Ven: 36 mmHg — ABNORMAL LOW (ref 44–60)
pH, Ven: 7.4 (ref 7.25–7.43)
pO2, Ven: <31 mmHg — CL (ref 32–45)

## 2023-12-18 LAB — URINALYSIS, W/ REFLEX TO CULTURE (INFECTION SUSPECTED)
Bacteria, UA: NONE SEEN
Bilirubin Urine: NEGATIVE
Glucose, UA: NEGATIVE mg/dL
Hgb urine dipstick: NEGATIVE
Ketones, ur: 20 mg/dL — AB
Nitrite: NEGATIVE
Protein, ur: NEGATIVE mg/dL
Specific Gravity, Urine: 1.008 (ref 1.005–1.030)
pH: 6 (ref 5.0–8.0)

## 2023-12-18 LAB — ETHANOL: Alcohol, Ethyl (B): <15 mg/dL (ref <15)

## 2023-12-18 LAB — CBC WITH DIFFERENTIAL/PLATELET
Abs Immature Granulocytes: 0.08 10*3/uL — ABNORMAL HIGH (ref 0.00–0.07)
Basophils Absolute: 0 10*3/uL (ref 0.0–0.1)
Basophils Relative: 0 %
Eosinophils Absolute: 0 10*3/uL (ref 0.0–0.5)
Eosinophils Relative: 0 %
HCT: 36.1 % (ref 36.0–46.0)
Hemoglobin: 11.9 g/dL — ABNORMAL LOW (ref 12.0–15.0)
Immature Granulocytes: 1 %
Lymphocytes Relative: 10 %
Lymphs Abs: 1 10*3/uL (ref 0.7–4.0)
MCH: 30.5 pg (ref 26.0–34.0)
MCHC: 33 g/dL (ref 30.0–36.0)
MCV: 92.6 fL (ref 80.0–100.0)
Monocytes Absolute: 0.9 10*3/uL (ref 0.1–1.0)
Monocytes Relative: 10 %
Neutro Abs: 7.6 10*3/uL (ref 1.7–7.7)
Neutrophils Relative %: 79 %
Platelets: 209 10*3/uL (ref 150–400)
RBC: 3.9 MIL/uL (ref 3.87–5.11)
RDW: 13.3 % (ref 11.5–15.5)
WBC: 9.7 10*3/uL (ref 4.0–10.5)
nRBC: 0 % (ref 0.0–0.2)

## 2023-12-18 LAB — COMPREHENSIVE METABOLIC PANEL WITH GFR
ALT: 17 U/L (ref 0–44)
AST: 28 U/L (ref 15–41)
Albumin: 3.5 g/dL (ref 3.5–5.0)
Alkaline Phosphatase: 51 U/L (ref 38–126)
Anion gap: 11 (ref 5–15)
BUN: 20 mg/dL (ref 8–23)
CO2: 21 mmol/L — ABNORMAL LOW (ref 22–32)
Calcium: 9.4 mg/dL (ref 8.9–10.3)
Chloride: 103 mmol/L (ref 98–111)
Creatinine, Ser: 1.05 mg/dL — ABNORMAL HIGH (ref 0.44–1.00)
GFR, Estimated: 53 mL/min — ABNORMAL LOW (ref >60)
Glucose, Bld: 85 mg/dL (ref 70–99)
Potassium: 3.8 mmol/L (ref 3.5–5.1)
Sodium: 135 mmol/L (ref 135–145)
Total Bilirubin: 0.9 mg/dL (ref 0.0–1.2)
Total Protein: 6.4 g/dL — ABNORMAL LOW (ref 6.5–8.1)

## 2023-12-18 LAB — AMMONIA: Ammonia: <13 umol/L (ref 9–35)

## 2023-12-18 LAB — MAGNESIUM: Magnesium: 2.1 mg/dL (ref 1.7–2.4)

## 2023-12-18 LAB — HEMOGLOBIN A1C
Hgb A1c MFr Bld: 5 % (ref 4.8–5.6)
Mean Plasma Glucose: 96.8 mg/dL

## 2023-12-18 MED ORDER — SODIUM CHLORIDE 0.9 % IV BOLUS
500.0000 mL | Freq: Once | INTRAVENOUS | Status: AC
Start: 2023-12-18 — End: 2023-12-18
  Administered 2023-12-18: 500 mL via INTRAVENOUS

## 2023-12-18 MED ORDER — ENOXAPARIN SODIUM 40 MG/0.4ML IJ SOSY
40.0000 mg | PREFILLED_SYRINGE | INTRAMUSCULAR | Status: DC
  Administered 2023-12-18 – 2023-12-19 (×2): 40 mg via SUBCUTANEOUS
  Filled 2023-12-18 (×2): qty 0.4

## 2023-12-18 MED ORDER — STROKE: EARLY STAGES OF RECOVERY BOOK
Freq: Once | Status: AC
  Administered 2023-12-19: 1
  Filled 2023-12-18: qty 1

## 2023-12-18 MED ORDER — ASPIRIN 325 MG PO TABS: 325.0000 mg | ORAL_TABLET | Freq: Every day | ORAL | Status: DC

## 2023-12-18 NOTE — ED Notes (Signed)
 Patient transported to MRI

## 2023-12-18 NOTE — ED Triage Notes (Signed)
 Pt BIB EMS from home for increasing AMS x a few days. Daughter reports pt attempted to ambulate with unsteady gait, not her norm. Baseline GCS 14.

## 2023-12-18 NOTE — ED Notes (Signed)
 Patient transported to CT

## 2023-12-18 NOTE — ED Provider Notes (Addendum)
 [place] EMERGENCY DEPARTMENT AT [facility] Provider Note   CSN: [id number] Arrival date & time: 12/18/23  1528     History  Chief Complaint  Patient presents with   Altered Mental Status    [name] is a 83 y.o. female.  Patient with altered mental status since Friday.  Family concerned about urinary tract infection or dehydration.  They describe that she has been having difficulty walking she has not been very alert.  Seems to be a fair amount of confusion.  Past medical history is significant for hypertension memory loss COPD status post TAVR transfer catheter aortic valve replacement just November 20, 2023.  Patient is on aspirin  patient is on Toprol XL patient is not on a blood thinner.  And patient was started on amoxicillin  May 1.  That may have been for concerns for urinary tract infection.  Patient had right heart catheterization in February 2025.  And patient's former smoker quit in 1996.       Home Medications Prior to Admission medications   Medication Sig Start Date End Date Taking? Authorizing Provider  amoxicillin  (AMOXIL ) 500 MG tablet Take 4 tablets (2,000 mg total) by mouth as directed. 1 hour prior to dental work including cleanings 11/29/23   Ardia Kraft, PA-C  aspirin  81 MG chewable tablet Chew 1 tablet (81 mg total) by mouth daily. 11/22/23   Ardia Kraft, PA-C  Calcium  Carbonate (CALTRATE 600 PO) Take 1 tablet by mouth daily after lunch.    [provider]  Cholecalciferol (VITAMIN D3) 10 MCG (400 UNIT) tablet Take 400 Units by mouth daily after lunch.    [provider]  dorzolamide -timolol  (COSOPT ) 22.3-6.8 MG/ML ophthalmic solution Place 1 drop into the left eye every 12 (twelve) hours.    [provider]  levothyroxine  (SYNTHROID ) 75 MCG tablet Take 75 mcg by mouth daily after lunch.    [provider]  LUMIGAN 0.01 % SOLN Place 1 drop into the left eye at bedtime. 04/13/20   [provider]  memantine  (NAMENDA ) 5 MG tablet TAKE 1 TABLET BY MOUTH EVERYDAY AT BEDTIME 06/14/23   Millikan, Megan, NP  metoprolol succinate (TOPROL-XL) 100 MG 24 hr tablet Take 100 mg by mouth daily after lunch. 03/20/20   [provider]  Multiple Vitamin (MULTIVITAMIN WITH MINERALS) TABS tablet Take 1 tablet by mouth daily after lunch. Centrum Silver    [provider]  prednisoLONE  acetate (PRED FORTE ) 1 % ophthalmic suspension Place 1 drop into the right eye daily with breakfast.    [provider]  rosuvastatin  (CRESTOR ) 5 MG tablet Take 5 mg by mouth daily after lunch.    [provider]      Allergies    Brimonidine, Naphazoline-polyethyl glycol, and Tropicamide    Review of Systems   Review of Systems  Unable to perform ROS: Mental status change    Physical Exam Updated Vital Signs BP (!) 107/94   Pulse 66   Temp 98.3 F (36.8 C) (Oral)   Resp 20   Ht 1.676 m (5\' 6" )   Wt 60.1 kg   SpO2 98%   BMI 21.39 kg/m  Physical Exam Vitals and nursing note reviewed.  Constitutional:      General: She is not in acute distress.    Appearance: Normal appearance. She is well-developed.  HENT:     Head: Normocephalic and atraumatic.  Eyes:     Extraocular Movements: Extraocular movements intact.  Conjunctiva/sclera: Conjunctivae normal.     Pupils: Pupils are equal, round, and reactive to light.  Cardiovascular:     Rate and Rhythm: Normal rate and regular rhythm.     Heart sounds: No murmur heard. Pulmonary:     Effort: Pulmonary effort is normal. No respiratory distress.     Breath sounds: Normal breath sounds.  Abdominal:     Palpations: Abdomen is soft.     Tenderness: There is no abdominal tenderness.  Musculoskeletal:        General: No swelling.     Cervical back: Normal range of motion and neck supple.     Right lower leg: No edema.     Left lower leg: No edema.  Skin:    General: Skin is warm and dry.     Capillary Refill:  Capillary refill takes less than 2 seconds.  Neurological:     Mental Status: She is alert.     Comments: Patient appears confused.  Does have some history of some dementia.  But according to family member worse than usual.  Also said that her gait was abnormal.  Psychiatric:        Mood and Affect: Mood normal.     ED Results / Procedures / Treatments   Labs (all labs ordered are listed, but only abnormal results are displayed) Labs Reviewed  COMPREHENSIVE METABOLIC PANEL WITH GFR - Abnormal; Notable for the following components:      Result Value   CO2 21 (*)    Creatinine, Ser 1.05 (*)    Total Protein 6.4 (*)    GFR, Estimated 53 (*)    All other components within normal limits  CBC WITH DIFFERENTIAL/PLATELET - Abnormal; Notable for the following components:   Hemoglobin 11.9 (*)    Abs Immature Granulocytes 0.08 (*)    All other components within normal limits  BLOOD GAS, VENOUS - Abnormal; Notable for the following components:   pCO2, Ven 36 (*)    pO2, Ven <31 (*)    Acid-base deficit 2.2 (*)    All other components within normal limits  URINALYSIS, W/ REFLEX TO CULTURE (INFECTION SUSPECTED) - Abnormal; Notable for the following components:   Color, Urine STRAW (*)    Ketones, ur 20 (*)    Leukocytes,Ua TRACE (*)    All other components within normal limits  AMMONIA  ETHANOL  MAGNESIUM     EKG None  Radiology MR BRAIN WO CONTRAST Result Date: 12/18/2023 CLINICAL DATA:  Initial evaluation for acute neuro deficit, stroke suspected. EXAM: MRI HEAD WITHOUT CONTRAST TECHNIQUE: Multiplanar, multiecho pulse sequences of the brain and surrounding structures were obtained without intravenous contrast. COMPARISON:  CT from earlier the same day. FINDINGS: Brain: Examination degraded by motion. Generalized age-related cerebral atrophy. Patchy T2/FLAIR hyperintensity involving the supratentorial cerebral white matter and pons, consistent with chronic small vessel ischemic  disease, moderately advanced in nature. Punctate focus of restricted diffusion seen involving the high left frontal lobe, consistent with a tiny acute ischemic nonhemorrhagic infarct (series 2, image 42). No other evidence for acute or subacute ischemia. No areas of chronic cortical infarction. No acute or significant chronic intracranial blood products. No mass lesion, midline shift or mass effect. No hydrocephalus or extra-axial fluid collection. Pituitary gland within normal limits. Vascular: Major intracranial vascular flow voids are maintained. Skull and upper cervical spine: Craniocervical junction within normal limits. Bone marrow signal intensity normal. No scalp soft tissue abnormality. Sinuses/Orbits: Prior bowel ocular lens replacement. Paranasal sinuses are largely  clear. No significant mastoid effusion. Other: None. IMPRESSION: 1. 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 2. Underlying age-related cerebral atrophy with moderately advanced chronic microvascular ischemic disease. Electronically Signed   By: Virgia Griffins M.D.   On: 12/18/2023 20:33   CT HEAD WO CONTRAST Result Date: 12/18/2023 CLINICAL DATA:  Mental status change, unknown cause. EXAM: CT HEAD WITHOUT CONTRAST TECHNIQUE: Contiguous axial images were obtained from the base of the skull through the vertex without intravenous contrast. RADIATION DOSE REDUCTION: This exam was performed according to the departmental dose-optimization program which includes automated exposure control, adjustment of the mA and/or kV according to patient size and/or use of iterative reconstruction technique. COMPARISON:  MRI head from 01/15/2022. FINDINGS: Brain: No evidence of acute infarction, hemorrhage, hydrocephalus, extra-axial collection or mass lesion/mass effect. There is bilateral periventricular hypodensity, which is non-specific but most likely seen in the settings of microvascular ischemic changes.  Moderate in extent. otherwise normal appearance of brain parenchyma. Ventricles are prominent but cerebral volume is age appropriate. Vascular: No hyperdense vessel or unexpected calcification. Intracranial arteriosclerosis. Skull: Normal. Negative for fracture or focal lesion. Sinuses/Orbits: No acute finding. Hyperdense implant noted in the right orbit. Other: Visualized mastoid air cells are unremarkable. No mastoid effusion. IMPRESSION: *No acute intracranial abnormality. Electronically Signed   By: Beula Brunswick M.D.   On: 12/18/2023 16:10   DG Chest Port 1 View Result Date: 12/18/2023 CLINICAL DATA:  Weakness. EXAM: PORTABLE CHEST 1 VIEW COMPARISON:  11/16/2023. FINDINGS: Biapical pleural thickening noted. Bilateral lung fields are otherwise clear. No acute consolidation or lung collapse. Bilateral costophrenic angles are clear. Stable cardio-mediastinal silhouette. 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 No acute osseous abnormalities. The soft tissues are within normal limits. IMPRESSION: No active disease. Electronically Signed   By: Beula Brunswick M.D.   On: 12/18/2023 16:08    Procedures Procedures    Medications Ordered in ED Medications  sodium chloride  0.9 % bolus 500 mL (0 mLs Intravenous Stopped 12/18/23 1936)    ED Course/ Medical Decision Making/ A&P                                 Medical Decision Making Risk Decision regarding hospitalization.   Patient's venous blood gases pending.  Complete metabolic panel significant for GFR 53 creatinine 1.05.  CO2 down a little bit at 21 on CBC white count 9.7 hemoglobin 11.9 platelets are normal.  Ammonia is less than 13 alcohol less than 15 magnesium  normal at 2.1 CT head without any acute findings and chest x-ray without any acute findings.  Could be some degree of some slight dehydration.  Urinalysis is pending and MRI brain pending.  Clinical concern would be whether there has been a CVA.  Urinalysis not consistent with urinary tract  infection.  Venous blood gas pH 7.4 pCO2 good at 36.  MRI brain punctate acute ischemic nonhemorrhagic infarct involving the high left frontal lobe will contact neurology over this.  And will need medicine admission  CRITICAL CARE Performed by: Shlome Baldree Total critical care time: 45 minutes Critical care time was exclusive of separately billable procedures and treating other patients. Critical care was necessary to treat or prevent imminent or life-threatening deterioration. Critical care was time spent personally by me on the following activities: development of treatment plan with patient and/or surrogate as well as nursing, discussions with consultants, evaluation of patient's response to treatment, examination of patient, obtaining  history from patient or surrogate, ordering and performing treatments and interventions, ordering and review of laboratory studies, ordering and review of radiographic studies, pulse oximetry and re-evaluation of patient's condition.  Final Clinical Impression(s) / ED Diagnoses Final diagnoses:  Altered mental status, unspecified altered mental status type  Cerebrovascular accident (CVA), unspecified mechanism [facility])    Rx / DC Orders ED Discharge Orders     None         Nicklas Barns, MD 12/18/23 1641    Nicklas Barns, MD 12/18/23 [id number]    Nicklas Barns, MD 12/18/23 2201

## 2023-12-18 NOTE — ED Provider Triage Note (Signed)
 Emergency Medicine Provider Triage Evaluation Note  [name] , a 83 y.o. female  was evaluated in triage.  Pt complains of altered mental status.  Review of Systems  Positive: Infusion, gait imbalance, generalized weakness Negative: Pain, abdominal pain, nausea, vomiting  Physical Exam  There were no vitals taken for this visit. Gen:   Awake, no distress   Resp:  Normal effort  MSK:   Moves extremities without difficulty  Other:  Left leg weakness present  Medical Decision Making  Medically screening exam initiated at 3:30 PM.  Appropriate orders placed.  [name] was informed that the remainder of the evaluation will be completed by another provider, this initial triage assessment does not replace that evaluation, and the importance of remaining in the ED until their evaluation is complete.  Patient presents from home.  Daughter called EMS due to 5 days of what is described as intermittent confusion, difficulty walking.  There is concern of UTI.  Patient is apparently alert and oriented x 2 at baseline.  EMS reports similar mentation but patient will occasionally say odd things.  On arrival, patient denies any areas of discomfort.  Exam notable for left leg weakness.  Workup initiated.   Iva Mariner, MD 12/18/23 [phone]

## 2023-12-19 ENCOUNTER — Observation Stay (HOSPITAL_COMMUNITY)

## 2023-12-19 ENCOUNTER — Observation Stay (HOSPITAL_BASED_OUTPATIENT_CLINIC_OR_DEPARTMENT_OTHER)

## 2023-12-19 DIAGNOSIS — I6389 Other cerebral infarction: Secondary | ICD-10-CM | POA: Diagnosis not present

## 2023-12-19 DIAGNOSIS — F0390 Unspecified dementia without behavioral disturbance: Secondary | ICD-10-CM | POA: Diagnosis not present

## 2023-12-19 DIAGNOSIS — R410 Disorientation, unspecified: Secondary | ICD-10-CM

## 2023-12-19 DIAGNOSIS — R29702 NIHSS score 2: Secondary | ICD-10-CM

## 2023-12-19 DIAGNOSIS — I639 Cerebral infarction, unspecified: Secondary | ICD-10-CM

## 2023-12-19 DIAGNOSIS — I6340 Cerebral infarction due to embolism of unspecified cerebral artery: Secondary | ICD-10-CM

## 2023-12-19 DIAGNOSIS — I69391 Dysphagia following cerebral infarction: Secondary | ICD-10-CM

## 2023-12-19 DIAGNOSIS — F0150 Vascular dementia without behavioral disturbance: Secondary | ICD-10-CM

## 2023-12-19 DIAGNOSIS — E785 Hyperlipidemia, unspecified: Secondary | ICD-10-CM

## 2023-12-19 DIAGNOSIS — I10 Essential (primary) hypertension: Secondary | ICD-10-CM

## 2023-12-19 LAB — LIPID PANEL
Cholesterol: 131 mg/dL (ref 0–200)
HDL: 77 mg/dL (ref >40)
LDL Cholesterol: 45 mg/dL (ref 0–99)
Total CHOL/HDL Ratio: 1.7 ratio
Triglycerides: 46 mg/dL (ref <150)
VLDL: 9 mg/dL (ref 0–40)

## 2023-12-19 LAB — BASIC METABOLIC PANEL WITH GFR
Anion gap: 11 (ref 5–15)
BUN: 15 mg/dL (ref 8–23)
CO2: 21 mmol/L — ABNORMAL LOW (ref 22–32)
Calcium: 9 mg/dL (ref 8.9–10.3)
Chloride: 103 mmol/L (ref 98–111)
Creatinine, Ser: 0.85 mg/dL (ref 0.44–1.00)
GFR, Estimated: >60 mL/min (ref >60)
Glucose, Bld: 76 mg/dL (ref 70–99)
Potassium: 3.8 mmol/L (ref 3.5–5.1)
Sodium: 135 mmol/L (ref 135–145)

## 2023-12-19 LAB — CBC
HCT: 35.8 % — ABNORMAL LOW (ref 36.0–46.0)
Hemoglobin: 11.7 g/dL — ABNORMAL LOW (ref 12.0–15.0)
MCH: 30.5 pg (ref 26.0–34.0)
MCHC: 32.7 g/dL (ref 30.0–36.0)
MCV: 93.5 fL (ref 80.0–100.0)
Platelets: 204 10*3/uL (ref 150–400)
RBC: 3.83 MIL/uL — ABNORMAL LOW (ref 3.87–5.11)
RDW: 13.2 % (ref 11.5–15.5)
WBC: 7.4 10*3/uL (ref 4.0–10.5)
nRBC: 0 % (ref 0.0–0.2)

## 2023-12-19 LAB — ECHOCARDIOGRAM COMPLETE
AR max vel: 2.24 cm2
AV Area VTI: 2.29 cm2
AV Area mean vel: 2.34 cm2
AV Mean grad: 5 mmHg
AV Peak grad: 8.8 mmHg
Ao pk vel: 1.49 m/s
Area-P 1/2: 3.03 cm2
Height: 66 in
MV VTI: 1.85 cm2
S' Lateral: 2.1 cm
Weight: 2119.94 [oz_av]

## 2023-12-19 LAB — MAGNESIUM: Magnesium: 2 mg/dL (ref 1.7–2.4)

## 2023-12-19 LAB — PHOSPHORUS: Phosphorus: 2.4 mg/dL — ABNORMAL LOW (ref 2.5–4.6)

## 2023-12-19 MED ORDER — HYDROCODONE-ACETAMINOPHEN 5-325 MG PO TABS
1.0000 | ORAL_TABLET | Freq: Four times a day (QID) | ORAL | Status: DC | PRN
  Administered 2023-12-20: 1 via ORAL
  Filled 2023-12-19: qty 1

## 2023-12-19 MED ORDER — ASPIRIN 325 MG PO TABS
325.0000 mg | ORAL_TABLET | Freq: Every day | ORAL | Status: DC
  Administered 2023-12-19: 325 mg via ORAL
  Filled 2023-12-19: qty 1

## 2023-12-19 MED ORDER — ASPIRIN 81 MG PO CHEW
81.0000 mg | CHEWABLE_TABLET | Freq: Every day | ORAL | Status: DC
  Administered 2023-12-19 – 2023-12-20 (×2): 81 mg via ORAL
  Filled 2023-12-19 (×2): qty 1

## 2023-12-19 MED ORDER — LACTATED RINGERS IV SOLN: INTRAVENOUS | Status: DC

## 2023-12-19 MED ORDER — POLYVINYL ALCOHOL 1.4 % OP SOLN: 1.0000 [drp] | OPHTHALMIC | Status: DC | PRN

## 2023-12-19 MED ORDER — ADULT MULTIVITAMIN W/MINERALS CH
1.0000 | ORAL_TABLET | Freq: Every day | ORAL | Status: DC
  Administered 2023-12-19 – 2023-12-27 (×8): 1 via ORAL
  Filled 2023-12-19 (×8): qty 1

## 2023-12-19 MED ORDER — LEVOTHYROXINE SODIUM 75 MCG PO TABS
75.0000 ug | ORAL_TABLET | Freq: Every day | ORAL | Status: DC
  Administered 2023-12-19 – 2023-12-27 (×8): 75 ug via ORAL
  Filled 2023-12-19 (×9): qty 1

## 2023-12-19 MED ORDER — MEMANTINE HCL 10 MG PO TABS
5.0000 mg | ORAL_TABLET | Freq: Every day | ORAL | Status: DC
  Administered 2023-12-19 – 2023-12-26 (×8): 5 mg via ORAL
  Filled 2023-12-19 (×8): qty 1

## 2023-12-19 MED ORDER — SENNOSIDES-DOCUSATE SODIUM 8.6-50 MG PO TABS: 1.0000 | ORAL_TABLET | Freq: Every evening | ORAL | Status: DC | PRN

## 2023-12-19 MED ORDER — METOPROLOL SUCCINATE ER 25 MG PO TB24
100.0000 mg | ORAL_TABLET | Freq: Every day | ORAL | Status: DC
  Administered 2023-12-19: 100 mg via ORAL
  Filled 2023-12-19: qty 4

## 2023-12-19 MED ORDER — PROCHLORPERAZINE EDISYLATE 10 MG/2ML IJ SOLN: 5.0000 mg | Freq: Four times a day (QID) | INTRAMUSCULAR | Status: DC | PRN

## 2023-12-19 MED ORDER — IOHEXOL 350 MG/ML SOLN
75.0000 mL | Freq: Once | INTRAVENOUS | Status: AC | PRN
  Administered 2023-12-19: 75 mL via INTRAVENOUS

## 2023-12-19 MED ORDER — MELATONIN 5 MG PO TABS
5.0000 mg | ORAL_TABLET | Freq: Every evening | ORAL | Status: DC | PRN
  Administered 2023-12-19: 5 mg via ORAL
  Filled 2023-12-19 (×2): qty 1

## 2023-12-19 MED ORDER — ROSUVASTATIN CALCIUM 5 MG PO TABS
5.0000 mg | ORAL_TABLET | Freq: Every day | ORAL | Status: DC
  Administered 2023-12-19 – 2023-12-27 (×8): 5 mg via ORAL
  Filled 2023-12-19 (×8): qty 1

## 2023-12-19 MED ORDER — CLOPIDOGREL BISULFATE 75 MG PO TABS
75.0000 mg | ORAL_TABLET | Freq: Every day | ORAL | Status: DC
  Administered 2023-12-19 – 2023-12-20 (×2): 75 mg via ORAL
  Filled 2023-12-19 (×2): qty 1

## 2023-12-19 MED ORDER — ACETAMINOPHEN 325 MG PO TABS
650.0000 mg | ORAL_TABLET | Freq: Four times a day (QID) | ORAL | Status: DC | PRN
  Filled 2023-12-19 (×2): qty 2

## 2023-12-19 MED ORDER — HALOPERIDOL LACTATE 5 MG/ML IJ SOLN
1.0000 mg | Freq: Four times a day (QID) | INTRAMUSCULAR | Status: DC | PRN
  Administered 2023-12-19: 1 mg via INTRAVENOUS
  Filled 2023-12-19: qty 1

## 2023-12-19 MED ORDER — LATANOPROST 0.005 % OP SOLN
1.0000 [drp] | Freq: Every day | OPHTHALMIC | Status: DC
  Administered 2023-12-20 – 2023-12-26 (×7): 1 [drp] via OPHTHALMIC
  Filled 2023-12-19: qty 2.5

## 2023-12-19 MED ORDER — LABETALOL HCL 5 MG/ML IV SOLN: 5.0000 mg | INTRAVENOUS | Status: DC | PRN

## 2023-12-19 MED ORDER — DORZOLAMIDE HCL-TIMOLOL MAL 2-0.5 % OP SOLN
1.0000 [drp] | Freq: Two times a day (BID) | OPHTHALMIC | Status: DC
  Administered 2023-12-19 – 2023-12-27 (×16): 1 [drp] via OPHTHALMIC
  Filled 2023-12-19: qty 10

## 2023-12-19 NOTE — Progress Notes (Signed)
 PROGRESS NOTE  [name] NWG:[id number] DOB: 12/23/1940 DOA: 12/18/2023 PCP: Imelda Man, MD  HPI/Recap of past 24 hours: [name] is a 83 y.o. 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  Associated with unsteady gait, which is not her baseline.  Family thought that she was having a urinary tract infection or dehydration and finally decided to bring her to the ER for further evaluation.  EMS was activated. In the ER, hypertensive, UA negative for pyuria and euvolemic on exam.  Noncontrast head CT did not show any acute intracranial abnormality however MRI brain showed punctate acute ischemic nonhemorrhagic infarct involving the left frontal lobe.  Underlying age-related cerebral atrophy with moderately advanced chronic microvascular ischemic disease. Neurology/stroke team was consulted. Stroke workup was initiated.  Admitted by [facility], hospitalist service.    Today, discussed extensively with patient and daughter at bedside.  Reported patient may have fallen at home and hit her right side which is currently tender, chest x-ray showed no rib fracture.  Denies any other focal neurologic deficits.  Noted confusion which is baseline mentation as per daughter.  Denies any other new complaints.    Assessment/Plan: Principal Problem:   Acute CVA (cerebrovascular accident) (HCC)  Acute CVA, POA Brain MRI revealed small left high frontal punctate stroke (recent TAVR procedure) CT angio head and neck with no emergent finding although suboptimal CT due to bolus timing Echo showed EF of 65 to 70%, no regional wall motion abnormality, grade 1 diastolic dysfunction, no atrial level shunt detected LDL 45, A1c 5 Permissive hypertension Neurology/stroke team recommend aspirin  and  clopidogrel for 3 weeks, then clopidogrel alone.  Recommend 30-day heart monitor after discharge Continue neurochecks and monitor on telemetry PT/OT rec [facility] Stroke team following   Vascular dementia Continue Namenda  Reorient as needed Fall precautions   Hypothyroidism Resume home levothyroxine    Hyperlipidemia Resume home Crestor   Hypertension Allow for permissive hypertension  Recent fall Right-sided lower abdomen/hip pain Chest x-ray with no rib fracture Hip x-ray pending    Estimated body mass index is 21.39 kg/m as calculated from the following:   Height as of this encounter: 5\' 6"  (1.676 m).   Weight as of this encounter: 60.1 kg.     Code Status: Full  Family Communication: Close with daughter at bedside  Disposition Plan: Status is: Observation The patient remains OBS appropriate and will d/c before 2 midnights.      Consultants: Neurology/stroke team  Procedures: None  Antimicrobials: None  DVT prophylaxis: Lovenox    Objective: Vitals:   12/19/23 1606 12/19/23 1606 12/19/23 1615 12/19/23 1745  BP: (!) 171/73  (!) 165/75 (!) 143/77  Pulse: 73  68 63  Resp:   18 (!) 24  Temp:  97.7 F (36.5 C)  98 F (36.7 C)  TempSrc:  Axillary  Axillary  SpO2:   100% 99%  Weight:      Height:        Intake/Output Summary (Last 24 hours) at 12/19/2023 1748 Last data filed at 12/19/2023 0231 Gross per 24 hour  Intake 360 ml  Output 750 ml  Net -390 ml   Filed Weights   12/18/23 1544  Weight: 60.1 kg    Exam:  General: NAD  Cardiovascular: S1, S2 present Respiratory: CTAB Abdomen: Soft, nontender, nondistended, bowel  sounds present Musculoskeletal: No bilateral pedal edema noted Skin: Normal Psychiatry: Normal mood Neurology: Strength equal in all extremities, no other focal neurologic deficits noted.  Gait not tested   Data Reviewed: CBC: Recent Labs  Lab 12/18/23 1535 12/18/23 2247 12/19/23 0422  WBC 9.7 7.7 7.4  NEUTROABS 7.6   --   --   HGB 11.9* 11.6* 11.7*  HCT 36.1 34.9* 35.8*  MCV 92.6 91.6 93.5  PLT 209 204 204   Basic Metabolic Panel: Recent Labs  Lab 12/18/23 1535 12/18/23 2247 12/19/23 0422  NA 135  --  135  K 3.8  --  3.8  CL 103  --  103  CO2 21*  --  21*  GLUCOSE 85  --  76  BUN 20  --  15  CREATININE 1.05* 0.91 0.85  CALCIUM  9.4  --  9.0  MG 2.1  --  2.0  PHOS  --   --  2.4*   GFR: Estimated Creatinine Clearance: 47.8 mL/min (by C-G formula based on SCr of 0.85 mg/dL). Liver Function Tests: Recent Labs  Lab 12/18/23 1535  AST 28  ALT 17  ALKPHOS 51  BILITOT 0.9  PROT 6.4*  ALBUMIN  3.5   No results for input(s): "LIPASE", "AMYLASE" in the last 168 hours. Recent Labs  Lab 12/18/23 1535  AMMONIA <13   Coagulation Profile: No results for input(s): "INR", "PROTIME" in the last 168 hours. Cardiac Enzymes: No results for input(s): "CKTOTAL", "CKMB", "CKMBINDEX", "TROPONINI" in the last 168 hours. BNP (last 3 results) No results for input(s): "PROBNP" in the last 8760 hours. HbA1C: Recent Labs    12/18/23 2247  HGBA1C 5.0   CBG: No results for input(s): "GLUCAP" in the last 168 hours. Lipid Profile: Recent Labs    12/19/23 0422  CHOL 131  HDL 77  LDLCALC 45  TRIG 46  CHOLHDL 1.7   Thyroid  Function Tests: No results for input(s): "TSH", "T4TOTAL", "FREET4", "T3FREE", "THYROIDAB" in the last 72 hours. Anemia Panel: No results for input(s): "VITAMINB12", "FOLATE", "FERRITIN", "TIBC", "IRON", "RETICCTPCT" in the last 72 hours. Urine analysis:    Component Value Date/Time   COLORURINE STRAW (A) 12/18/2023 2000   APPEARANCEUR CLEAR 12/18/2023 2000   LABSPEC 1.008 12/18/2023 2000   PHURINE 6.0 12/18/2023 2000   GLUCOSEU NEGATIVE 12/18/2023 2000   HGBUR NEGATIVE 12/18/2023 2000   BILIRUBINUR NEGATIVE 12/18/2023 2000   KETONESUR 20 (A) 12/18/2023 2000   PROTEINUR NEGATIVE 12/18/2023 2000   NITRITE NEGATIVE 12/18/2023 2000   LEUKOCYTESUR TRACE (A) 12/18/2023 2000    Sepsis Labs: @LABRCNTIP (procalcitonin:4,lacticidven:4)  )No results found for this or any previous visit (from the past 240 hours).    Studies: ECHOCARDIOGRAM COMPLETE Result Date: 12/19/2023    ECHOCARDIOGRAM REPORT   Patient Name:   [name] Date of Exam: 12/19/2023 Medical Rec #:  [id number]     Height:       66.0 in Accession #:    [id number]    Weight:       132.5 lb Date of Birth:  November 01, 1940     BSA:          1.679 m Patient Age:    82 years      BP:           171/67 mmHg Patient Gender: F             HR:           68 bpm. Exam Location:  Inpatient Procedure: 2D Echo,  Cardiac Doppler and Color Doppler (Both Spectral and Color            Flow Doppler were utilized during procedure). Indications:    Stroke  History:        Patient has prior history of Echocardiogram examinations, most                 recent 11/21/2023.  Sonographer:    Janette Medley Referring Phys: CAROLE N HALL IMPRESSIONS  1. Left ventricular ejection fraction, by estimation, is 65 to 70%. The left ventricle has normal function. The left ventricle has no regional wall motion abnormalities. Left ventricular diastolic parameters are consistent with Grade I diastolic dysfunction (impaired relaxation). Elevated left ventricular end-diastolic pressure.  2. Right ventricular systolic function is normal. The right ventricular size is normal.  3. The mitral valve is normal in structure. No evidence of mitral valve regurgitation. No evidence of mitral stenosis.  4. The aortic valve is normal in structure. Aortic valve regurgitation is mild. No aortic stenosis is present. Echo findings are consistent with normal structure and function of the aortic valve prosthesis. Aortic valve area, by VTI measures 2.29 cm. Aortic valve mean gradient measures 5.0 mmHg. Aortic valve Vmax measures 1.48 m/s.  5. The inferior vena cava is normal in size with greater than 50% respiratory variability, suggesting right atrial pressure of 3 mmHg. FINDINGS  Left  Ventricle: Left ventricular ejection fraction, by estimation, is 65 to 70%. The left ventricle has normal function. The left ventricle has no regional wall motion abnormalities. The left ventricular internal cavity size was normal in size. There is  no left ventricular hypertrophy. Left ventricular diastolic parameters are consistent with Grade I diastolic dysfunction (impaired relaxation). Elevated left ventricular end-diastolic pressure. Right Ventricle: The right ventricular size is normal. No increase in right ventricular wall thickness. Right ventricular systolic function is normal. Left Atrium: Left atrial size was normal in size. Right Atrium: Right atrial size was normal in size. Pericardium: There is no evidence of pericardial effusion. Mitral Valve: The mitral valve is normal in structure. No evidence of mitral valve regurgitation. No evidence of mitral valve stenosis. MV peak gradient, 7.5 mmHg. The mean mitral valve gradient is 5.0 mmHg. Tricuspid Valve: The tricuspid valve is normal in structure. Tricuspid valve regurgitation is not demonstrated. No evidence of tricuspid stenosis. Aortic Valve: The aortic valve is normal in structure. Aortic valve regurgitation is mild. No aortic stenosis is present. Aortic valve mean gradient measures 5.0 mmHg. Aortic valve peak gradient measures 8.8 mmHg. Aortic valve area, by VTI measures 2.29 cm. 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 Echo findings are consistent with normal structure and function of the aortic valve prosthesis. Pulmonic Valve: The pulmonic valve was normal in structure. Pulmonic valve regurgitation is mild. No evidence of pulmonic stenosis. Aorta: The aortic root is normal in size and structure. Venous: The inferior vena cava is normal in size with greater than 50% respiratory variability, suggesting right atrial pressure of 3 mmHg. IAS/Shunts: No atrial level shunt detected by color flow Doppler.   LEFT VENTRICLE PLAX 2D LVIDd:         3.40 cm   Diastology LVIDs:         2.10 cm   LV e' medial:    7.83 cm/s LV PW:         1.00 cm   LV E/e' medial:  14.3 LV IVS:  1.10 cm   LV e' lateral:   8.16 cm/s LVOT diam:     1.70 cm   LV E/e' lateral: 13.7 LV SV:         83 LV SV Index:   49 LVOT Area:     2.27 cm  RIGHT VENTRICLE             IVC RV S prime:     13.70 cm/s  IVC diam: 1.30 cm TAPSE (M-mode): 2.4 cm LEFT ATRIUM             Index        RIGHT ATRIUM           Index LA diam:        3.60 cm 2.14 cm/m   RA Area:     11.40 cm LA Vol (A2C):   22.4 ml 13.34 ml/m  RA Volume:   24.80 ml  14.77 ml/m LA Vol (A4C):   25.9 ml 15.43 ml/m LA Biplane Vol: 23.8 ml 14.18 ml/m  AORTIC VALVE AV Area (Vmax):    2.24 cm AV Area (Vmean):   2.34 cm AV Area (VTI):     2.29 cm AV Vmax:           148.50 cm/s AV Vmean:          98.900 cm/s AV VTI:            0.362 m AV Peak Grad:      8.8 mmHg AV Mean Grad:      5.0 mmHg LVOT Vmax:         146.50 cm/s LVOT Vmean:        102.000 cm/s LVOT VTI:          0.365 m LVOT/AV VTI ratio: 1.01  AORTA Ao Asc diam: 2.70 cm MITRAL VALVE MV Area (PHT): 3.03 cm     SHUNTS MV Area VTI:   1.85 cm     Systemic VTI:  0.36 m MV Peak grad:  7.5 mmHg     Systemic Diam: 1.70 cm MV Mean grad:  5.0 mmHg MV Vmax:       1.37 m/s MV Vmean:      106.0 cm/s MV Decel Time: 250 msec MV E velocity: 112.00 cm/s MV A velocity: 124.00 cm/s MV E/A ratio:  0.90 Maudine Sos MD Electronically signed by Maudine Sos MD Signature Date/Time: 12/19/2023/12:25:33 PM    Final    CT ANGIO HEAD NECK W WO CM Result Date: 12/19/2023 CLINICAL DATA:  Stroke, determine embolic source EXAM: CT ANGIOGRAPHY HEAD AND NECK WITH AND WITHOUT CONTRAST TECHNIQUE: Multidetector CT imaging of the head and neck was performed using the standard protocol during bolus administration of intravenous contrast. Multiplanar CT image reconstructions and MIPs were obtained to evaluate the vascular anatomy. Carotid stenosis  measurements (when applicable) are obtained utilizing NASCET criteria, using the distal internal carotid diameter as the denominator. RADIATION DOSE REDUCTION: This exam was performed according to the departmental dose-optimization program which includes automated exposure control, adjustment of the mA and/or kV according to patient size and/or use of iterative reconstruction technique. CONTRAST:  75mL OMNIPAQUE  IOHEXOL  350 MG/ML SOLN COMPARISON:  Brain MRI from yesterday FINDINGS: CT HEAD FINDINGS Brain: The patient's acute infarct is occult by noncontrast CT. Cerebral volume loss and extensive chronic small vessel ischemia. No intracranial hemorrhage Vascular: No hyperdense vessel or unexpected calcification. 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 Review of the MIP images confirms  the above findings CTA NECK FINDINGS Aortic arch: Extensive atheromatous plaque. 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 Right carotid system: No flow reducing stenosis or ulceration seen. Suboptimal opacification due to preferential venous timing. Left carotid system: Scattered atheromatous plaque, greatest calcification at the bifurcation. No stenosis or ulceration is seen. Vertebral arteries: No proximal subclavian stenosis. Left dominant vertebral artery. No evidence of flow reducing stenosis, beading, or dissection in the vertebral arteries - limited by contrast timing. Skeleton: Generalized cervical spine degeneration. Other neck: No acute finding.  Absent right submandibular gland Upper chest: Pleural based scarring with calcification at the apices. Review of the MIP images confirms the above findings CTA HEAD FINDINGS Anterior circulation: Extensive atheromatous calcification along the carotid siphons. No branch occlusion, beading, or aneurysm. Limited in evaluating peripheral branches due to bolus quality. Posterior circulation: The vertebral and basilar  arteries are sufficiently patent. Fetal type left PCA. No branch occlusion or evidence of proximal flow reducing stenosis. No evidence of aneurysm Venous sinuses: Diffusely patent Review of the MIP images confirms the above findings IMPRESSION: Suboptimal CTA due to bolus timing. No emergent finding. Atherosclerosis without flow reducing stenosis or irregularity involving major arteries in the head and neck. Electronically Signed   By: Ronnette Coke M.D.   On: 12/19/2023 08:36   MR BRAIN WO CONTRAST Result Date: 12/18/2023 CLINICAL DATA:  Initial evaluation for acute neuro deficit, stroke suspected. EXAM: MRI HEAD WITHOUT CONTRAST TECHNIQUE: Multiplanar, multiecho pulse sequences of the brain and surrounding structures were obtained without intravenous contrast. COMPARISON:  CT from earlier the same day. FINDINGS: Brain: Examination degraded by motion. Generalized age-related cerebral atrophy. Patchy T2/FLAIR hyperintensity involving the supratentorial cerebral white matter and pons, consistent with chronic small vessel ischemic disease, moderately advanced in nature. Punctate focus of restricted diffusion seen involving the high left frontal lobe, consistent with a tiny acute ischemic nonhemorrhagic infarct (series 2, image 42). No other evidence for acute or subacute ischemia. No areas of chronic cortical infarction. No acute or significant chronic intracranial blood products. No mass lesion, midline shift or mass effect. No hydrocephalus or extra-axial fluid collection. Pituitary gland within normal limits. Vascular: Major intracranial vascular flow voids are maintained. Skull and upper cervical spine: Craniocervical junction within normal limits. Bone marrow signal intensity normal. No scalp soft tissue abnormality. Sinuses/Orbits: Prior bowel ocular lens replacement. Paranasal sinuses are largely clear. No significant mastoid effusion. Other: None. IMPRESSION: 1. Punctate acute ischemic nonhemorrhagic  infarct involving the high left frontal lobe. 2. Underlying age-related cerebral atrophy with moderately advanced chronic microvascular ischemic disease. Electronically Signed   By: Virgia Griffins M.D.   On: 12/18/2023 20:33    Scheduled Meds:  aspirin   81 mg Oral Daily   clopidogrel  75 mg Oral Daily   dorzolamide -timolol   1 drop Left Eye Q12H   enoxaparin  (LOVENOX ) injection  40 mg Subcutaneous Q24H   latanoprost   1 drop Left Eye QHS   levothyroxine   75 mcg Oral Q0600   memantine   5 mg Oral QHS   metoprolol succinate  100 mg Oral QPC lunch   multivitamin with minerals  1 tablet Oral QPC lunch   rosuvastatin   5 mg Oral QPC lunch    Continuous Infusions:  lactated ringers  75 mL/hr at 12/19/23 1613     LOS: 0 days     Veronica Gordon, MD [facility]  If 7PM-7AM, please contact night-coverage [email or web address] 12/19/2023, 5:48 PM

## 2023-12-19 NOTE — Plan of Care (Signed)
  Problem: Education: Goal: Knowledge of secondary prevention will improve (MUST DOCUMENT ALL) Outcome: Progressing   Problem: Coping: Goal: Will verbalize positive feelings about self Outcome: Progressing   Problem: Nutrition: Goal: Risk of aspiration will decrease Outcome: Progressing

## 2023-12-19 NOTE — Consult Note (Signed)
 NEUROLOGY CONSULT NOTE   Date of service: Dec 19, 2023 Patient Name: [name] MRN:  [id number] DOB:  15-Aug-1940 Chief Complaint: "Incidental left frontal punctate stroke" Requesting Provider: Bary Boss, DO  History of Present Illness  ANEESHA Grant is a 83 y.o. female with hx of COPD, osteoporosis, severe aortic stenosis status post TAVR, diagnosed mild Alzheimer's and vascular dementia and follows with [facility], who presents with intermittent confusion since Friday.  As part of workup for confusion, she had an MRI of the brain which demonstrated a tiny punctate left frontal stroke.  Patient unsure of where she is. She is disoriented to month and year but she is awake, can do simple calculations, reason pretty well. She tells me that she is sick. Has poor short term memory and recollection.  She had MRI brain w/o contrast which was notable for a small punctate L frontal lobe for which neurology was consulted further evaluation or workup.  Patient denies any prior history of strokes.  LKW: Unclear, I suspect the stroke is asymptomatic. Modified rankin score: 3-Moderate disability-requires help but walks WITHOUT assistance IV Thrombolysis: Not offered, suspect stroke is asymptomatic. EVT: not offered, incidental stroke, low suspicion for LVO.   NIHSS components Score: Comment  1a Level of Conscious 0[x]  1[]  2[]  3[]      1b LOC Questions 0[]  1[]  2[x]       1c LOC Commands 0[x]  1[]  2[]       2 Best Gaze 0[x]  1[]  2[]       3 Visual 0[x]  1[]  2[]  3[]      4 Facial Palsy 0[x]  1[]  2[]  3[]      5a Motor Arm - left 0[x]  1[]  2[]  3[]  4[]  UN[]    5b Motor Arm - Right 0[x]  1[]  2[]  3[]  4[]  UN[]    6a Motor Leg - Left 0[x]  1[]  2[]  3[]  4[]  UN[]    6b Motor Leg - Right 0[x]  1[]  2[]  3[]  4[]  UN[]    7 Limb Ataxia 0[x]  1[]  2[]  UN[]      8 Sensory 0[x]  1[]  2[]  UN[]      9 Best Language 0[x]  1[]  2[]  3[]      10 Dysarthria 0[x]  1[]  2[]  UN[]      11 Extinct. 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 Abel Hoe & Dr. Honey Lusty   Scoliosis    Severe aortic stenosis    Wears glasses     Past Surgical History:  Procedure Laterality Date   CESAREAN SECTION  07/31/1976   TWINS, ONE WAS STILLBORN   INTRAOPERATIVE TRANSTHORACIC ECHOCARDIOGRAM N/A 11/20/2023   Procedure: ECHOCARDIOGRAM, TRANSTHORACIC;  Surgeon: Odie Benne, MD;  Location: [facility];  Service: Cardiovascular;  Laterality: N/A;   MOUTH SURGERY  07/31/1994   tongue resection   NODULE REMOVED     BENIGN, LOWER NECK   RIGHT HEART CATH AND CORONARY ANGIOGRAPHY N/A 09/21/2023  Procedure: RIGHT HEART CATH AND CORONARY ANGIOGRAPHY;  Surgeon: Odie Benne, MD;  Location: [facility];  Service: Cardiovascular;  Laterality: N/A;    Family History: Family History  Problem Relation Age of Onset   Thyroid  disease Mother    Dementia Mother    Thyroid  disease Sister     Social History  reports that she quit smoking about 29 years ago. Her smoking use included cigarettes. She started smoking about 31 years ago. She has a 0.4 pack-year smoking history. She has never used smokeless tobacco. She reports that she does not drink alcohol and does not use drugs.  Allergies  Allergen Reactions   Brimonidine Itching    Red eyes   Fluocinolone Acetonide Other (See Comments)    Unable to recall   Naphazoline-Polyethyl Glycol     THAT YOU USE TO DILATE YOUR EYES.Aaron AasMAKES BP GO UP, CAN ONLY USE HALF STRENGTH   Triamcinolone  Acetonide Other (See Comments)    Unable to recall   Tropicamide Other (See Comments)    Elevates IOP OK to use 0.5%    Medications   Current Facility-Administered Medications:     stroke: early stages of recovery book, , Does not apply, Once, Hall, Carole N, DO   acetaminophen  (TYLENOL ) tablet 650 mg, 650 mg, Oral, Q6H PRN, Hall, Carole N, DO   aspirin  tablet 325 mg, 325 mg, Oral, Daily, Hall, Carole N, DO, 325 mg at 12/19/23 0216   enoxaparin  (LOVENOX ) injection 40 mg, 40 mg, Subcutaneous, Q24H, Hall, Carole N, DO, 40 mg at 12/18/23 2313   lactated ringers  infusion, , Intravenous, Continuous, Bary Boss, DO, Last Rate: 75 mL/hr at 12/19/23 0216, New Bag at 12/19/23 0216   levothyroxine  (SYNTHROID ) tablet 75 mcg, 75 mcg, Oral, Q0600, Hall, Carole N, DO   melatonin tablet 5 mg, 5 mg, Oral, QHS PRN, [place], Carole N, DO   multivitamin with minerals tablet 1 tablet, 1 tablet, Oral, QPC lunch, Hall, Carole N, DO   prochlorperazine (COMPAZINE) injection 5 mg, 5 mg, Intravenous, Q6H PRN, Hall, Carole N, DO   rosuvastatin  (CRESTOR ) tablet 5 mg, 5 mg, Oral, QPC lunch, Hall, Carole N, DO   senna-docusate (Senokot-S) tablet 1 tablet, 1 tablet, Oral, QHS PRN, Bary Boss, DO  Current Outpatient Medications:    aspirin  81 MG chewable tablet, Chew 1 tablet (81 mg total) by mouth daily., Disp: , Rfl:    Calcium  Carbonate (CALTRATE 600 PO), Take 1 tablet by mouth daily after lunch., Disp: , Rfl:    Cholecalciferol (VITAMIN D3) 10 MCG (400 UNIT) tablet, Take 400 Units by mouth daily after lunch., Disp: , Rfl:    dorzolamide -timolol  (COSOPT ) 22.3-6.8 MG/ML ophthalmic solution, Place 1 drop into the left eye every 12 (twelve) hours., Disp: , Rfl:    levothyroxine  (SYNTHROID ) 75 MCG tablet, Take 75 mcg by mouth daily after lunch., Disp: , Rfl:    LUMIGAN 0.01 % SOLN, Place 1 drop into the left eye at bedtime., Disp: , Rfl:    memantine  (NAMENDA ) 5 MG tablet, TAKE 1 TABLET BY MOUTH EVERYDAY AT  BEDTIME, Disp: 90 tablet, Rfl: 4   metoprolol succinate (TOPROL-XL) 100 MG 24 hr tablet, Take 100 mg by mouth daily after lunch., Disp: , Rfl:    Multiple Vitamin (MULTIVITAMIN WITH MINERALS) TABS tablet, Take 1 tablet by mouth daily after lunch. Centrum Silver, Disp: , Rfl:    polyvinyl alcohol (ARTIFICIAL TEARS) 1.4 % ophthalmic solution, Place 1 drop into both eyes as needed  for dry eyes., Disp: , Rfl:    rosuvastatin  (CRESTOR ) 5 MG tablet, Take 5 mg by mouth daily after lunch., Disp: , Rfl:   Vitals   Vitals:   12/19/23 0245 12/19/23 0300 12/19/23 0315 12/19/23 0415  BP: (!) 145/64 (!) 160/70 (!) 152/66 (!) 157/66  Pulse: 64 71 64 68  Resp: 19 17 20 20   Temp:  98.4 F (36.9 C)    TempSrc:  Oral    SpO2: 99% 98% 96% 95%  Weight:      Height:        Body mass index is 21.39 kg/m.  Physical Exam   General: Laying comfortably in bed; in no acute distress.  HENT: Normal oropharynx and mucosa. Normal external appearance of ears and nose.  Neck: Supple, no pain or tenderness  CV: No JVD. No peripheral edema.  Pulmonary: Symmetric Chest rise. Normal respiratory effort.  Abdomen: Soft to touch, non-tender.  Ext: No cyanosis, edema, or deformity  Skin: No rash. Normal palpation of skin.   Musculoskeletal: Normal digits and nails by inspection. No clubbing.   Neurologic Examination  Mental status/Cognition: Alert, oriented to self, place, but not to month and year, good attention. Able to do simple calculations, reason well. Speech/language: Fluent, comprehension intact, object naming intact, repetition intact.  Cranial nerves:   CN II Pupils equal and reactive to light, no VF deficits    CN III,IV,VI EOM intact, no gaze preference or deviation, no nystagmus    CN V normal sensation in V1, V2, and V3 segments bilaterally    CN VII no asymmetry, no nasolabial fold flattening    CN VIII normal hearing to speech    CN IX & X normal palatal elevation, no uvular deviation    CN XI  5/5 head turn and 5/5 shoulder shrug bilaterally    CN XII midline tongue protrusion    Motor:  Muscle bulk: normal, tone normal, pronator drift none tremor none Mvmt Root Nerve  Muscle Right Left Comments  SA C5/6 Ax Deltoid 5 5   EF C5/6 Mc Biceps 5 5   EE C6/7/8 Rad Triceps 5 5   WF C6/7 Med FCR     WE C7/8 PIN ECU     F Ab C8/T1 U ADM/FDI 5 5   HF L1/2/3 Fem Illopsoas 5 5   KE L2/3/4 Fem Quad 5 5   DF L4/5 D Peron Tib Ant 5 5   PF S1/2 Tibial Grc/Sol 5 5    Sensation:  Light touch Intact throughout   Pin prick    Temperature    Vibration   Proprioception    Coordination/Complex Motor:  - Finger to Nose intact bilaterally - Heel to shin intact bilaterally - Rapid alternating movement are normal - Gait: Deferred. Labs/Imaging/Neurodiagnostic studies   CBC:  Recent Labs  Lab Dec 21, 2023 1535 Dec 21, 2023 2247  WBC 9.7 7.7  NEUTROABS 7.6  --   HGB 11.9* 11.6*  HCT 36.1 34.9*  MCV 92.6 91.6  PLT 209 204   Basic Metabolic Panel:  Lab Results  Component Value Date   NA 135 12-21-23   K 3.8 12/21/2023   CO2 21 (L) 12/21/23   GLUCOSE 85 2023-12-21   BUN 20 2023/12/21   CREATININE 0.91 21-Dec-2023   CALCIUM  9.4 Dec 21, 2023   GFRNONAA >60 21-Dec-2023   Lipid Panel: No results found for: "LDLCALC" HgbA1c:  Lab Results  Component Value Date   HGBA1C 5.0 12-21-2023   Urine Drug Screen: No results found  for: "LABOPIA", "COCAINSCRNUR", "LABBENZ", "AMPHETMU", "THCU", "LABBARB"  Alcohol Level     Component Value Date/Time   ETH <15 12/18/2023 1535   INR  Lab Results  Component Value Date   INR 1.0 11/16/2023   APTT No results found for: "APTT" AED levels: No results found for: "PHENYTOIN", "ZONISAMIDE", "LAMOTRIGINE", "LEVETIRACETA"  CT Head without contrast(Personally reviewed): CTH was negative for a large hypodensity concerning for a large territory infarct or hyperdensity concerning for an ICH  CT angio Head and Neck with contrast(Personally  reviewed): Pending  MRI Brain(Personally reviewed): Small left high frontal punctate stroke.  ASSESSMENT   MIRKA BARBONE is a 83 y.o. female p/w intermittent confusion. No noted significant metabolic abnormalities noted on labs. Has a hx of dementia and on exam is disoriented to month and year with poor short term memory and 0/3 recall at 1 mins and 5 mins. However, is awake, able to do simple calculations, reason pretty well. She had a small punctate L frontal lobe stroke for which neurology was consulted.  I suspect that the stroke is likely incidental finding and is not contributing to her disorientation.  I think more so than delirium, her presentation seems to be more consistent with progression of dementia, which is expected.  We will get stroke workup.  RECOMMENDATIONS  Plan:  Recommend that primary team order following: - Frequent Neuro checks per stroke unit protocol - Recommend Vascular imaging with CTA head and neck. - Recommend obtaining TTE - Recommend obtaining Lipid panel with LDL - Please start statin if LDL > 70 - Recommend HbA1c to evaluate for diabetes and how well it is controlled. - Antithrombotic - aspirin  81mg  daily. - Recommend DVT ppx - SBP goal - aim for gradual normotension. - Recommend Telemetry monitoring for arrythmia - Recommend bedside swallow screen prior to PO intake. - Stroke education booklet - Recommend PT/OT/SLP consult ______________________________________________________________________    Signed, Marsa Matteo, MD [facility]

## 2023-12-19 NOTE — Evaluation (Signed)
 Physical Therapy Evaluation Patient Details Name: [name] MRN: [id number] DOB: [birth year] Today's Date: 12/19/2023  History of Present Illness  83 y.o. female presents to [facility] 12/18/23 with intermittent confusion. Brain MRI showed small punctate L frontal CVA. PMHx: COPD, osteoporosis, severe aortic stenosis status post TAVR, diagnosed mild Alzheimer's and vascular dementia   Clinical Impression  Pt in bed upon arrival and agreeable to PT eval. PTA, pt was independent for mobility with no AD. Pt reports having a history of falling, however, was unable to recall any MOI. Home set-up and PLOF taken from daughter on the phone. Pt was initially unsteady with 2HH to ambulate 60 ft and MinAx2. Improved stability with RW with MinA for cues for safety and hallway navigation. Pt has intermittent assist available at home from daughter with cameras in the home to monitor safety. Recommending HHPT to work on deficits and prevent future falls. Pt currently with functional limitations due to the deficits listed below (see PT Problem List). Pt would benefit from acute skilled PT to address functional impairments. Acute PT to follow.         If plan is discharge home, recommend the following: A lot of help with walking and/or transfers;A lot of help with bathing/dressing/bathroom;Assistance with cooking/housework;Direct supervision/assist for medications management;Direct supervision/assist for financial management;Assist for transportation;Help with stairs or ramp for entrance   Can travel by private vehicle    Yes    Equipment Recommendations Rolling walker (2 wheels)     Functional Status Assessment Patient has had a recent decline in their functional status and demonstrates the ability to make significant improvements in function in a reasonable and predictable amount of time.     Precautions / Restrictions Precautions Precautions: Fall Recall of Precautions/Restrictions:  Impaired Precaution/Restrictions Comments: Alz/Dementia at baseline Restrictions Weight Bearing Restrictions Per Provider Order: No      Mobility  Bed Mobility Overal bed mobility: Needs Assistance Bed Mobility: Supine to Sit, Sit to Supine    Supine to sit: Min assist (ED stretcher, for trunk elevation, R lower back painful) Sit to supine: Mod assist (ED stretcher, assist for LE onto stretcher)   General bed mobility comments: multimodal cues for sequencing and safety from ED stretcher    Transfers Overall transfer level: Needs assistance Equipment used: 2 person hand held assist, Rolling walker (2 wheels) Transfers: Sit to/from Stand Sit to Stand: Min assist, Contact guard assist, +2 safety/equipment, From elevated surface (ED stretcher, toilet)    General transfer comment: power up improved throughout session, Pt cautious and hesitant, improved with UE support    Ambulation/Gait Ambulation/Gait assistance: Min assist, +2 safety/equipment Gait Distance (Feet): 120 Feet (x60 with 2HH, x60 with RW) Assistive device: Rolling walker (2 wheels), 2 person hand held assist Gait Pattern/deviations: Step-through pattern, Drifts right/left, Trunk flexed, Narrow base of support, Decreased stride length Gait velocity: decr     General Gait Details: forward flexed trunk with pt mildly unsteady. Improved stability with RW with MinA for cues for safety and navigating in the hallway     Balance Overall balance assessment: Needs assistance, Mild deficits observed, not formally tested, History of Falls Sitting-balance support: No upper extremity supported, Feet supported Sitting balance-Leahy Scale: Fair     Standing balance support: Bilateral upper extremity supported, During functional activity, Reliant on assistive device for balance Standing balance-Leahy Scale: Poor Standing balance comment: reliant on RW and external support        Pertinent Vitals/Pain Pain Assessment Pain  Assessment: Faces Faces Pain  Scale: Hurts little more Pain Location: R lower back from fall a few days ago Pain Descriptors / Indicators: Discomfort, Nagging, Sore Pain Intervention(s): Limited activity within patient's tolerance, Monitored during session, Repositioned    Home Living Family/patient expects to be discharged to:: Private residence Living Arrangements: Children (daughter) Available Help at Discharge: Family;Available PRN/intermittently Type of Home: House Home Access: Stairs to enter Entrance Stairs-Rails: None Entrance Stairs-Number of Steps: 3   Home Layout: One level Home Equipment: None;Other (comment) (cameras inside the house for daughter to make sure mom is ok while shes at work) Additional Comments: daughter works during the day,    Prior Function Prior Level of Function : Independent/Modified Independent;History of Falls (last six months)    Mobility Comments: Ind with no AD, will furniture surf, likes to walk to [employer] and get mail. and likes to go on walks around the neighborhood with daughter. Multiple recent falls ADLs Comments: does not drive, typically does small cooking and cleaning and showers standing up, daughter manages medicines and finanaces     Extremity/Trunk Assessment   Upper Extremity Assessment Upper Extremity Assessment: Defer to OT evaluation    Lower Extremity Assessment Lower Extremity Assessment: Generalized weakness    Cervical / Trunk Assessment Cervical / Trunk Assessment: Kyphotic  Communication   Communication Communication: No apparent difficulties    Cognition Arousal: Alert Behavior During Therapy: WFL for tasks assessed/performed   PT - Cognitive impairments: History of cognitive impairments, Orientation, Memory, Attention, Sequencing, Problem solving, Safety/Judgement   Orientation impairments: Place, Time, Situation     Following commands: Intact (delayed - able to complete with extra time)       Cueing  Cueing Techniques: Verbal cues, Gestural cues, Tactile cues     General Comments General comments (skin integrity, edema, etc.): Pt reports multiple falls recently, one where she fell against the iron bed and hurt her lower R back.     PT Assessment Patient needs continued PT services  PT Problem List Decreased strength;Decreased activity tolerance;Decreased balance;Decreased mobility;Decreased knowledge of use of DME;Decreased safety awareness       PT Treatment Interventions DME instruction;Gait training;Stair training;Functional mobility training;Therapeutic activities;Therapeutic exercise;Balance training;Neuromuscular re-education;Patient/family education    PT Goals (Current goals can be found in the Care Plan section)  Acute Rehab PT Goals Patient Stated Goal: to go home PT Goal Formulation: With patient/family Time For Goal Achievement: 01/02/24 Potential to Achieve Goals: Good    Frequency Min 2X/week     Co-evaluation   Reason for Co-Treatment: Necessary to address cognition/behavior during functional activity;To address functional/ADL transfers;For patient/therapist safety PT goals addressed during session: Mobility/safety with mobility;Balance;Proper use of DME;Strengthening/ROM OT goals addressed during session: ADL's and self-care;Strengthening/ROM;Proper use of Adaptive equipment and DME       AM-PAC PT "6 Clicks" Mobility  Outcome Measure Help needed turning from your back to your side while in a flat bed without using bedrails?: A Little Help needed moving from lying on your back to sitting on the side of a flat bed without using bedrails?: A Little Help needed moving to and from a bed to a chair (including a wheelchair)?: A Little Help needed standing up from a chair using your arms (e.g., wheelchair or bedside chair)?: A Little Help needed to walk in hospital room?: A Little Help needed climbing 3-5 steps with a railing? : A Lot 6 Click Score: 17    End  of Session Equipment Utilized During Treatment: Gait belt Activity Tolerance: Patient tolerated treatment well Patient  left: in bed;with call bell/phone within reach Nurse Communication: Mobility status PT Visit Diagnosis: Unsteadiness on feet (R26.81);Other abnormalities of gait and mobility (R26.89);Muscle weakness (generalized) (M62.81);History of falling (Z91.81)    Time: 1610-9604 PT Time Calculation (min) (ACUTE ONLY): 33 min   Charges:   PT Evaluation $PT Eval Low Complexity: 1 Low   PT General Charges $$ ACUTE PT VISIT: 1 Visit       Orysia Blas, PT, DPT Secure Chat Preferred  Rehab Office [phone]   Alissa April Adela Ades 12/19/2023, 10:30 AM

## 2023-12-19 NOTE — H&P (Signed)
 History and Physical  [name] JYN:[id number] DOB: 06-14-1941 DOA: 12/18/2023  Referring physician: Dr. Zackowski, EDP. PCP: Imelda Man, MD  Outpatient Specialists: Cardiology, cardiovascular surgery, neurology, ophthalmology. Patient coming from: Home.  Chief Complaint: Confusion.  HPI: [name] is a 83 y.o. 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  Associated with unsteady gait, which is not her baseline.  Family thought that she was having a urinary tract infection or dehydration and finally decided to bring her to the ER for further evaluation.  EMS was activated.  In the ER, hypertensive, UA negative for pyuria and euvolemic on exam.  Noncontrast head CT did not show any acute intracranial abnormality however MRI brain showed punctate acute ischemic nonhemorrhagic infarct involving the hide left frontal lobe.  Underlying age-related cerebral atrophy with moderately advanced chronic microvascular ischemic disease.  Neurology/stroke team was consulted and saw the patient.  Stroke workup was initiated.  Admitted by [facility], hospitalist service.  ED Course: Temperature 98.4.  BP 160/70, pulse 98, respiration rate 18, O2 saturation 94% on room air.  Review of Systems: Review of systems as noted in the HPI. All other systems reviewed and are negative.   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  Abel Hoe & Dr. Honey Lusty   Scoliosis    Severe aortic stenosis    Wears glasses    Past Surgical History:  Procedure Laterality Date   CESAREAN SECTION  07/31/1976   TWINS, ONE WAS STILLBORN   INTRAOPERATIVE TRANSTHORACIC ECHOCARDIOGRAM N/A 11/20/2023   Procedure: ECHOCARDIOGRAM, TRANSTHORACIC;  Surgeon: Odie Benne, MD;  Location: [facility] INVASIVE CV LAB;  Service: Cardiovascular;  Laterality: N/A;   MOUTH SURGERY  07/31/1994   tongue resection   NODULE REMOVED     BENIGN, LOWER NECK   RIGHT HEART CATH AND CORONARY ANGIOGRAPHY N/A 09/21/2023   Procedure: RIGHT HEART CATH AND CORONARY ANGIOGRAPHY;  Surgeon: Odie Benne, MD;  Location: [facility] INVASIVE CV LAB;  Service: Cardiovascular;  Laterality: N/A;    Social History:  reports that she quit smoking about 29 years ago. Her smoking use included cigarettes. She started smoking about 31 years ago. She has a 0.4 pack-year smoking history. She has never used smokeless tobacco. She reports that she does not drink alcohol and does not use drugs.   Allergies  Allergen Reactions   Brimonidine Itching    Red eyes   Naphazoline-Polyethyl Glycol     THAT YOU USE TO DILATE YOUR EYES.Aaron AasMAKES BP GO UP, CAN ONLY USE HALF STRENGTH   Tropicamide Other (See Comments)    Elevates IOP OK to use 0.5%    Family History  Problem Relation Age of Onset   Thyroid  disease Mother    Dementia Mother    Thyroid  disease Sister       Prior to Admission medications   Medication Sig  Start Date End Date Taking? Authorizing Provider  amoxicillin  (AMOXIL ) 500 MG tablet Take 4 tablets (2,000 mg total) by mouth as directed. 1 hour prior to dental work including cleanings 11/29/23   Ardia Kraft, PA-C  aspirin  81 MG chewable tablet Chew 1 tablet (81 mg total) by mouth daily. 11/22/23   Ardia Kraft, PA-C  Calcium  Carbonate (CALTRATE 600 PO) Take 1 tablet by mouth daily after lunch.    [provider]  Cholecalciferol (VITAMIN  D3) 10 MCG (400 UNIT) tablet Take 400 Units by mouth daily after lunch.    [provider]  dorzolamide -timolol  (COSOPT ) 22.3-6.8 MG/ML ophthalmic solution Place 1 drop into the left eye every 12 (twelve) hours.    [provider]  levothyroxine  (SYNTHROID ) 75 MCG tablet Take 75 mcg by mouth daily after lunch.    [provider]  LUMIGAN 0.01 % SOLN Place 1 drop into the left eye at bedtime. 04/13/20   [provider]  memantine  (NAMENDA ) 5 MG tablet TAKE 1 TABLET BY MOUTH EVERYDAY AT BEDTIME 06/14/23   Millikan, Megan, NP  metoprolol succinate (TOPROL-XL) 100 MG 24 hr tablet Take 100 mg by mouth daily after lunch. 03/20/20   [provider]  Multiple Vitamin (MULTIVITAMIN WITH MINERALS) TABS tablet Take 1 tablet by mouth daily after lunch. Centrum Silver    [provider]  prednisoLONE  acetate (PRED FORTE ) 1 % ophthalmic suspension Place 1 drop into the right eye daily with breakfast.    [provider]  rosuvastatin  (CRESTOR ) 5 MG tablet Take 5 mg by mouth daily after lunch.    [provider]    Physical Exam: BP (!) 154/74   Pulse 71   Temp 98.2 F (36.8 C) (Oral)   Resp 19   Ht 5\' 6"  (1.676 m)   Wt 60.1 kg   SpO2 97%   BMI 21.39 kg/m   General: 83 y.o. year-old female well developed well nourished in no acute distress.  Alert and oriented x2. Cardiovascular: Regular rate and rhythm with no rubs or gallops.  No thyromegaly or JVD noted.  No lower extremity edema. 2/4 pulses in all 4 extremities. Respiratory: Clear to auscultation with no wheezes or rales. Good inspiratory effort. Abdomen: Soft nontender nondistended with normal bowel sounds x4 quadrants. Muskuloskeletal: No cyanosis, clubbing or edema noted bilaterally Neuro: CN II-XII intact, strength, sensation, reflexes Skin: No ulcerative lesions noted or rashes Psychiatry: Judgement and insight appear altered. Mood is appropriate for condition and setting           Labs on Admission:  Basic Metabolic Panel: Recent Labs  Lab 12/18/23 1535 12/18/23 2247  NA 135  --   K 3.8  --   CL 103  --   CO2 21*  --   GLUCOSE 85  --   BUN 20  --   CREATININE 1.05* 0.91  CALCIUM  9.4  --   MG 2.1  --    Liver Function Tests: Recent Labs  Lab 12/18/23 1535  AST 28  ALT 17  ALKPHOS 51  BILITOT 0.9  PROT 6.4*  ALBUMIN  3.5   No results for input(s): "LIPASE", "AMYLASE" in the last 168 hours. Recent Labs  Lab 12/18/23 1535  AMMONIA <13   CBC: Recent Labs  Lab 12/18/23 1535 12/18/23 2247  WBC 9.7 7.7  NEUTROABS 7.6  --   HGB 11.9* 11.6*  HCT 36.1 34.9*  MCV 92.6 91.6  PLT 209 204   Cardiac Enzymes: No results  for input(s): "CKTOTAL", "CKMB", "CKMBINDEX", "TROPONINI" in the last 168 hours.  BNP (last 3 results) No results for input(s): "BNP" in the last 8760 hours.  ProBNP (last 3 results) No results for input(s): "PROBNP" in the last 8760 hours.  CBG: No results for input(s): "GLUCAP" in the last 168 hours.  Radiological Exams on Admission: MR BRAIN WO CONTRAST Result Date: 12/18/2023 CLINICAL DATA:  Initial evaluation for acute neuro deficit, stroke suspected. EXAM: MRI HEAD WITHOUT CONTRAST TECHNIQUE: Multiplanar, multiecho pulse sequences of the brain and surrounding structures were obtained without intravenous contrast. COMPARISON:  CT from earlier the same day. FINDINGS: Brain: Examination degraded by motion. Generalized age-related cerebral atrophy. Patchy T2/FLAIR hyperintensity involving the supratentorial cerebral white matter and pons, consistent with chronic small vessel ischemic disease, moderately advanced in nature. Punctate focus of restricted diffusion seen involving the high left frontal lobe, consistent with a tiny acute ischemic nonhemorrhagic infarct (series 2, image 42). No other evidence for acute or subacute ischemia. No areas of chronic cortical infarction. No acute or significant chronic intracranial blood  products. No mass lesion, midline shift or mass effect. No hydrocephalus or extra-axial fluid collection. Pituitary gland within normal limits. Vascular: Major intracranial vascular flow voids are maintained. Skull and upper cervical spine: Craniocervical junction within normal limits. Bone marrow signal intensity normal. No scalp soft tissue abnormality. Sinuses/Orbits: Prior bowel ocular lens replacement. Paranasal sinuses are largely clear. No significant mastoid effusion. Other: None. IMPRESSION: 1. 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 2. Underlying age-related cerebral atrophy with moderately advanced chronic microvascular ischemic disease. Electronically Signed   By: Virgia Griffins M.D.   On: 12/18/2023 20:33   CT HEAD WO CONTRAST Result Date: 12/18/2023 CLINICAL DATA:  Mental status change, unknown cause. EXAM: CT HEAD WITHOUT CONTRAST TECHNIQUE: Contiguous axial images were obtained from the base of the skull through the vertex without intravenous contrast. RADIATION DOSE REDUCTION: This exam was performed according to the departmental dose-optimization program which includes automated exposure control, adjustment of the mA and/or kV according to patient size and/or use of iterative reconstruction technique. COMPARISON:  MRI head from 01/15/2022. FINDINGS: Brain: No evidence of acute infarction, hemorrhage, hydrocephalus, extra-axial collection or mass lesion/mass effect. There is bilateral periventricular hypodensity, which is non-specific but most likely seen in the settings of microvascular ischemic changes. Moderate in extent. otherwise normal appearance of brain parenchyma. Ventricles are prominent but cerebral volume is age appropriate. Vascular: No hyperdense vessel or unexpected calcification. Intracranial arteriosclerosis. Skull: Normal. Negative for fracture or focal lesion. Sinuses/Orbits: No acute finding. Hyperdense implant noted in the  right orbit. Other: Visualized mastoid air cells are unremarkable. No mastoid effusion. IMPRESSION: *No acute intracranial abnormality. Electronically Signed   By: Beula Brunswick M.D.   On: 12/18/2023 16:10   DG Chest Port 1 View Result Date: 12/18/2023 CLINICAL DATA:  Weakness. EXAM: PORTABLE CHEST 1 VIEW COMPARISON:  11/16/2023. FINDINGS: Biapical pleural thickening noted. Bilateral lung fields are otherwise clear. No acute consolidation or lung collapse. Bilateral costophrenic angles are clear. Stable cardio-mediastinal silhouette. 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 No acute osseous abnormalities. The soft tissues are within normal limits. IMPRESSION: No active disease. Electronically Signed   By: Beula Brunswick M.D.   On: 12/18/2023 16:08    EKG: I independently viewed the EKG done and my findings are as followed: Sinus rhythm rate of 71.  Nonspecific ST-T changes.  QTc 451.  Assessment/Plan Present on Admission:  Acute CVA (cerebrovascular accident) [facility])  Principal Problem:  Acute CVA (cerebrovascular accident) (HCC)  Acute CVA, POA Brain MRI revealed small left high frontal punctate stroke. Follow CT angio head and neck and 2D echo Permissive hypertension Treat SBP greater than 220 or DBP greater than 120 IV labetalol  with parameters. Aspirin  81 mg daily as recommended by neurology Follow-up past lipid panel, hemoglobin A1c Continue neurochecks and monitor on telemetry PT/OT/speech therapy evaluation Neurology following.  Vascular dementia Reorient as needed Fall precautions.  Hypothyroidism Resume home levothyroxine   Hyperlipidemia Resume home Crestor  Follow fasting lipid panel Goal LDL less than 70   Time: 75 minutes.   DVT prophylaxis: Subcu Lovenox  daily.  Code Status: Full code.  Family Communication: None at bedside.  Disposition Plan: Admitted to telemetry medical unit.  Consults called: Neurology/stroke team.  Admission status: Observation  status   Status is: Observation    Bary Boss MD [facility] Pager [phone]  If 7PM-7AM, please contact night-coverage [email or web address] Password [email or web address]  12/19/2023, 2:01 AM

## 2023-12-19 NOTE — Evaluation (Signed)
 Occupational Therapy Evaluation Patient Details Name: [name] MRN: [id number] DOB: November 24, 1940 Today's Date: 12/19/2023   History of Present Illness   83 y.o. female presents to [facility] 12/18/23 with intermittent confusion. Brain MRI showed small punctate L frontal CVA. PMHx: COPD, osteoporosis, severe aortic stenosis status post TAVR, diagnosed mild Alzheimer's and vascular dementia     Clinical Impressions Pt is typically mod I for ambulation without DME, does furniture surf, but also walks to the mailbox and goes on neighborhood walks with daughter. Pt is alone during the day while daughter is at work (cameras for safety) and dresses, feeds, and entertains herself. Daughter manages her meds and finances and has lived with her the past 3 years. Today Pt presents with decreased cognition from baseline, decreased balance and activity tolerance, decreased knowledge of DME, increased assist for ADL and transfers. She will benefit from skilled OT in the acute setting as well as afterwards at the [facility] level. This is for multiple reasons as being in her home environment will allow the most cognitive clarity (to allow true new changes to be revealed), and HHOT will maximize safety and independence for ADL and transfers in home environment. At this time recommending shower chair, and next session to focus on safe use of DME, and access to LB for ADL/standing tolerance for grooming.   Of note: at least 2 falls in the past 5 days, one of which was against her bed where she hit her right lower back and Pt reports still painful      If plan is discharge home, recommend the following:   A little help with walking and/or transfers;A little help with bathing/dressing/bathroom;Assistance with cooking/housework;Direct supervision/assist for medications management;Direct supervision/assist for financial management;Assist for transportation;Help with stairs or ramp for entrance     Functional Status  Assessment   Patient has had a recent decline in their functional status and demonstrates the ability to make significant improvements in function in a reasonable and predictable amount of time.     Equipment Recommendations   Tub/shower seat     Recommendations for Other Services   PT consult;Speech consult     Precautions/Restrictions   Precautions Precautions: Fall Recall of Precautions/Restrictions: Impaired Precaution/Restrictions Comments: Alz/Dementia at baseline Restrictions Weight Bearing Restrictions Per Provider Order: No     Mobility Bed Mobility Overal bed mobility: Needs Assistance Bed Mobility: Supine to Sit, Sit to Supine     Supine to sit: Min assist (ED stretcher, for trunk elevation, R lower back painful) Sit to supine: Mod assist (ED stretcher, assist for LE onto stretcher)   General bed mobility comments: multimodal cues for sequencing and safety from ED stretcher    Transfers Overall transfer level: Needs assistance Equipment used: 2 person hand held assist, Rolling walker (2 wheels) Transfers: Sit to/from Stand Sit to Stand: Min assist, Contact guard assist, +2 safety/equipment, From elevated surface (ED stretcher, toilet)           General transfer comment: power up improved throughout session, Pt cautious and hesitant, improved with UE support      Balance                                           ADL either performed or assessed with clinical judgement   ADL Overall ADL's : Needs assistance/impaired Eating/Feeding: Set up Eating/Feeding Details (indicate cue type and reason): meal tray in  room, Pt reports self-feeding- not witnessed Grooming: Wash/dry hands;Contact guard assist;Standing Grooming Details (indicate cue type and reason): sink level Upper Body Bathing: Minimal assistance;Sitting   Lower Body Bathing: Moderate assistance Lower Body Bathing Details (indicate cue type and reason): knees  down Upper Body Dressing : Contact guard assist;Sitting Upper Body Dressing Details (indicate cue type and reason): gown Lower Body Dressing: Moderate assistance;Sitting/lateral leans Lower Body Dressing Details (indicate cue type and reason): required assist with socks, able to manage her own underwear for toileting Toilet Transfer: Minimal assistance;+2 for safety/equipment;Ambulation;Cueing for safety Toilet Transfer Details (indicate cue type and reason): multimodal cues for sequencing and safety, line management, poor balance Toileting- Clothing Manipulation and Hygiene: Sitting/lateral lean;Contact guard assist;Sit to/from stand Toileting - Clothing Manipulation Details (indicate cue type and reason): able to manage peri care with TP and pull up underwear without assist (and panty liner in underwear)     Functional mobility during ADLs: Moderate assistance;+2 for safety/equipment;Cueing for sequencing;Rolling walker (2 wheels);Minimal assistance (min A with walker mod A without) General ADL Comments: decreased balance, cognition, safety awareness     Vision Baseline Vision/History: 3 Glaucoma (R eye legally blind) Ability to See in Adequate Light: 1 Impaired Patient Visual Report: No change from baseline Vision Assessment?: Vision impaired- to be further tested in functional context     Perception         Praxis         Pertinent Vitals/Pain Pain Assessment Pain Assessment: Faces Faces Pain Scale: Hurts little more Pain Location: R lower back from fall a few days ago Pain Descriptors / Indicators: Discomfort, Nagging, Sore Pain Intervention(s): Limited activity within patient's tolerance, Monitored during session, Repositioned     Extremity/Trunk Assessment Upper Extremity Assessment Upper Extremity Assessment: Generalized weakness   Lower Extremity Assessment Lower Extremity Assessment: Defer to PT evaluation   Cervical / Trunk Assessment Cervical / Trunk  Assessment: Kyphotic   Communication Communication Communication: No apparent difficulties   Cognition Arousal: Alert Behavior During Therapy: WFL for tasks assessed/performed Cognition: History of cognitive impairments, Cognition impaired (daughter reports worse than baseline)   Orientation impairments: Situation, Time Awareness: Intellectual awareness impaired, Online awareness impaired Memory impairment (select all impairments): Short-term memory, Working [employer] fast streamer, Conservation officer, historic buildings Attention impairment (select first level of impairment): Sustained attention Executive functioning impairment (select all impairments): Organization, Reasoning, Problem solving OT - Cognition Comments: Pt thought she was in the hospital for cataract sx, needed multimodal cues for basic self-care tasks, pleasant, cooperative, could not recall facts about past or home set up (she told us  she lived alone)                 Following commands: Intact (delayed - able to complete with extra time)       Cueing  General Comments   Cueing Techniques: Verbal cues;Gestural cues;Tactile cues  Pt reports multiple falls recently, one where she fell against the iron bed and hurt her lower R back.   Exercises     Shoulder Instructions      Home Living Family/patient expects to be discharged to:: Private residence Living Arrangements: Children (daughter) Available Help at Discharge: Family;Available PRN/intermittently Type of Home: House Home Access: Stairs to enter [employer] of Steps: 3 Entrance Stairs-Rails: None Home Layout: One level     Bathroom Shower/Tub: Chief Strategy Officer: Standard     Home Equipment: None;Other (comment) (cameras inside the house for daughter to make sure mom is ok while shes at work)  Additional Comments: daughter works during the day,      Prior Functioning/Environment Prior Level of Function : Independent/Modified Independent              Mobility Comments: Ind with no AD, will furniture surf, likes to walk to [employer] and get mail. and likes to go on walks around the neighborhood with daughter ADLs Comments: does not drive, typically does small cooking and cleaning and showers standing up, daughter manages medicines and finanaces    OT Problem List: Decreased strength;Decreased activity tolerance;Impaired balance (sitting and/or standing);Decreased cognition;Decreased safety awareness;Decreased knowledge of use of DME or AE;Pain   OT Treatment/Interventions: Self-care/ADL training;DME and/or AE instruction;Therapeutic activities;Patient/family education;Balance training      OT Goals(Current goals can be found in the care plan section)   Acute Rehab OT Goals Patient Stated Goal: get as independent as possible OT Goal Formulation: With patient/family Time For Goal Achievement: 01/02/24 Potential to Achieve Goals: Good   OT Frequency:  Min 2X/week    Co-evaluation PT/OT/SLP Co-Evaluation/Treatment: Yes Reason for Co-Treatment: Necessary to address cognition/behavior during functional activity;To address functional/ADL transfers;For patient/therapist safety PT goals addressed during session: Mobility/safety with mobility;Balance;Proper use of DME;Strengthening/ROM OT goals addressed during session: ADL's and self-care;Strengthening/ROM;Proper use of Adaptive equipment and DME      AM-PAC OT "6 Clicks" Daily Activity     Outcome Measure Help from another person eating meals?: A Little Help from another person taking care of personal grooming?: A Little Help from another person toileting, which includes using toliet, bedpan, or urinal?: A Little Help from another person bathing (including washing, rinsing, drying)?: A Lot Help from another person to put on and taking off regular upper body clothing?: A Little Help from another person to put on and taking off regular lower body clothing?: A Lot 6 Click  Score: 16   End of Session Equipment Utilized During Treatment: Gait belt;Rolling walker (2 wheels) Nurse Communication: Mobility status (emptied 900 urine)  Activity Tolerance: Patient tolerated treatment well Patient left: with call bell/phone within reach (ED stretcher)  OT Visit Diagnosis: Unsteadiness on feet (R26.81);Repeated falls (R29.6);History of falling (Z91.81);Other symptoms and signs involving the nervous system (R29.898);Other symptoms and signs involving cognitive function                Time: 6578-4696 OT Time Calculation (min): 34 min Charges:  OT General Charges $OT Visit: 1 Visit OT Evaluation $OT Eval Moderate Complexity: 1 Mod  Chales [place] OTR/L Acute Rehabilitation Services Office: [phone]   Ebony Goldstein [facility] 12/19/2023, 10:07 AM

## 2023-12-19 NOTE — Progress Notes (Addendum)
 STROKE TEAM PROGRESS NOTE    INTERIM HISTORY/SUBJECTIVE No family at the bedside.  Patient is awake and alert, confused oriented to self only no focal deficits noted.  She has baseline dementia MRI brain with punctate acute infarct in left frontal lobe  CBC    Component Value Date/Time   WBC 7.4 12/19/2023 0422   RBC 3.83 (L) 12/19/2023 0422   HGB 11.7 (L) 12/19/2023 0422   HGB 12.9 09/10/2023 1650   HCT 35.8 (L) 12/19/2023 0422   HCT 38.9 09/10/2023 1650   PLT 204 12/19/2023 0422   PLT 270 09/10/2023 1650   MCV 93.5 12/19/2023 0422   MCV 92 09/10/2023 1650   MCH 30.5 12/19/2023 0422   MCHC 32.7 12/19/2023 0422   RDW 13.2 12/19/2023 0422   RDW 12.0 09/10/2023 1650   LYMPHSABS 1.0 12/18/2023 1535   MONOABS 0.9 12/18/2023 1535   EOSABS 0.0 12/18/2023 1535   BASOSABS 0.0 12/18/2023 1535    BMET    Component Value Date/Time   NA 135 12/19/2023 0422   NA 140 09/10/2023 1650   K 3.8 12/19/2023 0422   CL 103 12/19/2023 0422   CO2 21 (L) 12/19/2023 0422   GLUCOSE 76 12/19/2023 0422   BUN 15 12/19/2023 0422   BUN 36 (H) 09/10/2023 1650   CREATININE 0.85 12/19/2023 0422   CALCIUM  9.0 12/19/2023 0422   EGFR 45 (L) 09/10/2023 1650   GFRNONAA >60 12/19/2023 0422    IMAGING past 24 hours CT ANGIO HEAD NECK W WO CM Result Date: 12/19/2023 CLINICAL DATA:  Stroke, determine embolic source EXAM: CT ANGIOGRAPHY HEAD AND NECK WITH AND WITHOUT CONTRAST TECHNIQUE: Multidetector CT imaging of the head and neck was performed using the standard protocol during bolus administration of intravenous contrast. Multiplanar CT image reconstructions and MIPs were obtained to evaluate the vascular anatomy. Carotid stenosis measurements (when applicable) are obtained utilizing NASCET criteria, using the distal internal carotid diameter as the denominator. RADIATION DOSE REDUCTION: This exam was performed according to the departmental dose-optimization program which includes automated exposure control,  adjustment of the mA and/or kV according to patient size and/or use of iterative reconstruction technique. CONTRAST:  75mL OMNIPAQUE  IOHEXOL  350 MG/ML SOLN COMPARISON:  Brain MRI from yesterday FINDINGS: CT HEAD FINDINGS Brain: The patient's acute infarct is occult by noncontrast CT. Cerebral volume loss and extensive chronic small vessel ischemia. No intracranial hemorrhage Vascular: No hyperdense vessel or unexpected calcification. 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 Review of the MIP images confirms the above findings CTA NECK FINDINGS Aortic arch: Extensive atheromatous plaque. 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 Right carotid system: No flow reducing stenosis or ulceration seen. Suboptimal opacification due to preferential venous timing. Left carotid system: Scattered atheromatous plaque, greatest calcification at the bifurcation. No stenosis or ulceration is seen. Vertebral arteries: No proximal subclavian stenosis. Left dominant vertebral artery. No evidence of flow reducing stenosis, beading, or dissection in the vertebral arteries - limited by contrast timing. Skeleton: Generalized cervical spine degeneration. Other neck: No acute finding.  Absent right submandibular gland Upper chest: Pleural based scarring with calcification at the apices. Review of the MIP images confirms the above findings CTA HEAD FINDINGS Anterior circulation: Extensive atheromatous calcification along the carotid siphons. No branch occlusion, beading, or aneurysm. Limited in evaluating peripheral branches due to bolus quality. Posterior circulation: The vertebral and basilar arteries are sufficiently patent. Fetal type left PCA. No branch occlusion or evidence of proximal flow reducing stenosis.  No evidence of aneurysm Venous sinuses: Diffusely patent Review of the MIP images confirms the above findings IMPRESSION: Suboptimal CTA due to bolus timing. No  emergent finding. Atherosclerosis without flow reducing stenosis or irregularity involving major arteries in the head and neck. Electronically Signed   By: Ronnette Coke M.D.   On: 12/19/2023 08:36   MR BRAIN WO CONTRAST Result Date: 12/18/2023 CLINICAL DATA:  Initial evaluation for acute neuro deficit, stroke suspected. EXAM: MRI HEAD WITHOUT CONTRAST TECHNIQUE: Multiplanar, multiecho pulse sequences of the brain and surrounding structures were obtained without intravenous contrast. COMPARISON:  CT from earlier the same day. FINDINGS: Brain: Examination degraded by motion. Generalized age-related cerebral atrophy. Patchy T2/FLAIR hyperintensity involving the supratentorial cerebral white matter and pons, consistent with chronic small vessel ischemic disease, moderately advanced in nature. Punctate focus of restricted diffusion seen involving the high left frontal lobe, consistent with a tiny acute ischemic nonhemorrhagic infarct (series 2, image 42). No other evidence for acute or subacute ischemia. No areas of chronic cortical infarction. No acute or significant chronic intracranial blood products. No mass lesion, midline shift or mass effect. No hydrocephalus or extra-axial fluid collection. Pituitary gland within normal limits. Vascular: Major intracranial vascular flow voids are maintained. Skull and upper cervical spine: Craniocervical junction within normal limits. Bone marrow signal intensity normal. No scalp soft tissue abnormality. Sinuses/Orbits: Prior bowel ocular lens replacement. Paranasal sinuses are largely clear. No significant mastoid effusion. Other: None. IMPRESSION: 1. 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 2. Underlying age-related cerebral atrophy with moderately advanced chronic microvascular ischemic disease. Electronically Signed   By: Virgia Griffins M.D.   On: 12/18/2023 20:33   CT HEAD WO CONTRAST Result Date: 12/18/2023 CLINICAL  DATA:  Mental status change, unknown cause. EXAM: CT HEAD WITHOUT CONTRAST TECHNIQUE: Contiguous axial images were obtained from the base of the skull through the vertex without intravenous contrast. RADIATION DOSE REDUCTION: This exam was performed according to the departmental dose-optimization program which includes automated exposure control, adjustment of the mA and/or kV according to patient size and/or use of iterative reconstruction technique. COMPARISON:  MRI head from 01/15/2022. FINDINGS: Brain: No evidence of acute infarction, hemorrhage, hydrocephalus, extra-axial collection or mass lesion/mass effect. There is bilateral periventricular hypodensity, which is non-specific but most likely seen in the settings of microvascular ischemic changes. Moderate in extent. otherwise normal appearance of brain parenchyma. Ventricles are prominent but cerebral volume is age appropriate. Vascular: No hyperdense vessel or unexpected calcification. Intracranial arteriosclerosis. Skull: Normal. Negative for fracture or focal lesion. Sinuses/Orbits: No acute finding. Hyperdense implant noted in the right orbit. Other: Visualized mastoid air cells are unremarkable. No mastoid effusion. IMPRESSION: *No acute intracranial abnormality. Electronically Signed   By: Beula Brunswick M.D.   On: 12/18/2023 16:10   DG Chest Port 1 View Result Date: 12/18/2023 CLINICAL DATA:  Weakness. EXAM: PORTABLE CHEST 1 VIEW COMPARISON:  11/16/2023. FINDINGS: Biapical pleural thickening noted. Bilateral lung fields are otherwise clear. No acute consolidation or lung collapse. Bilateral costophrenic angles are clear. Stable cardio-mediastinal silhouette. 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 No acute osseous abnormalities. The soft tissues are within normal limits. IMPRESSION: No active disease. Electronically Signed   By: Beula Brunswick M.D.   On: 12/18/2023 16:08    Vitals:   12/19/23 0615 12/19/23 0630 12/19/23 1000 12/19/23 1113  BP: (!)  160/70  (!) 166/77   Pulse: 91  78   Resp: 18  (!) 21   Temp:  98.1 F (36.7 C)  (!)  97.5 F (36.4 C)  TempSrc:  Oral  Axillary  SpO2: 94%  99%   Weight:      Height:         PHYSICAL EXAM General:  Alert, well-nourished, well-developed patient in no acute distress Psych:  Mood and affect appropriate for situation CV: Regular rate and rhythm on monitor Respiratory:  Regular, unlabored respirations on room air GI: Abdomen soft and nontender   NEURO:  Mental Status: AA&O to self only, confused follows commands No aphasia or dysarthria but speech is tangential and flight of ideas Cranial Nerves:  II: PERRL. Visual fields full.  III, IV, VI: EOMI. Eyelids elevate symmetrically.  V: Sensation is intact to light touch and symmetrical to face.  VII: Face is symmetrical resting and smiling VIII: hearing intact to voice. IX, X: Palate elevates symmetrically. Phonation is normal.  ZO:[id number] shrug 5/5. XII: tongue is midline without fasciculations. Motor: 5/5 strength to all muscle groups tested.  Tone: is normal and bulk is normal Sensation- Intact to light touch bilaterally. Extinction absent to light touch to DSS.   Coordination: FTN intact bilaterally, HKS: no ataxia in BLE.No drift.  Gait- deferred  Most Recent NIH 2  ASSESSMENT/PLAN  Ms. AZZIE THIEM is a 83 y.o. female with history of COPD, glaucoma, hypertension, hypothyroidism, dementia, aortic stenosis s/p TAVR admitted for confusion and was found to have an incidental punctate left frontal stroke.  NIH on Admission 2  Acute Ischemic Infarct:  left frontal  Etiology: Likely embolic CT head No acute abnormality.  CTA head & neck no LVO MRI  Punctate acute ischemic nonhemorrhagic infarct involving the high left frontal lobe. 2D Echo pending Recommend 30-day heart monitor after discharge LDL 45 HgbA1c 5.0 VTE prophylaxis -Lovenox  aspirin  81 mg daily prior to admission, now on aspirin  81 mg daily and clopidogrel  75 mg daily for 3 weeks and then Plavix alone. Therapy recommendations:  Pending Disposition: Pending  Memory issues vascular dementia Follows with [facility] On home Namenda  5 mg continue  Hypertension Aortic stenosis s/p TAVR Home meds: Toprol XL 100 mg Stable Blood Pressure Goal: SBP less than 160   Hyperlipidemia Home meds: Crestor  5 mg,  resumed in hospital LDL 45, goal < 70 Continue statin at discharge  Dysphagia Patient has post-stroke dysphagia, SLP consulted    Diet   Diet Heart Room service appropriate? Yes; Fluid consistency: Thin   Advance diet as tolerated  Other Stroke Risk Factors Coronary artery disease   Other Active Problems Hypothyroidism Glaucoma COPD Hospital day # 0   Jonette Nestle DNP, ACNPC-AG  [facility]  I have personally obtained history,examined this patient, reviewed notes, independently viewed imaging studies, participated in medical decision making and plan of care.ROS completed by me personally and pertinent positives fully documented  I have made any additions or clarifications directly to the above note. Agree with note above.  Patient with history of baseline dementia presents with increased confusion with MRI scan showing a small left frontal embolic infarct possibly of cardioembolic etiology.  Recommend continue ongoing stroke workup and patient will likely need 30-day heart monitor on discharge.  Aspirin  Plavix for 3 weeks followed by Plavix alone and aggressive risk factor modification.  No family available at the bedside.  Greater than 50% time during this 50-minute visit was spent in counseling and coordination of care about her embolic stroke and dementia discussion about evaluation and treatment and answering questions.  Ardella Beaver, MD Medical Director [facility] Pager: [phone]  12/19/2023 3:53 PM   To contact Stroke Continuity provider, please refer to [email or web address]. After hours, contact General  Neurology

## 2023-12-20 ENCOUNTER — Telehealth: Payer: Self-pay | Admitting: Physician Assistant

## 2023-12-20 ENCOUNTER — Observation Stay (HOSPITAL_COMMUNITY)

## 2023-12-20 ENCOUNTER — Telehealth: Payer: Self-pay

## 2023-12-20 ENCOUNTER — Other Ambulatory Visit: Payer: Self-pay | Admitting: Cardiology

## 2023-12-20 DIAGNOSIS — Z01810 Encounter for preprocedural cardiovascular examination: Secondary | ICD-10-CM | POA: Diagnosis not present

## 2023-12-20 DIAGNOSIS — I69391 Dysphagia following cerebral infarction: Secondary | ICD-10-CM | POA: Diagnosis not present

## 2023-12-20 DIAGNOSIS — F0150 Vascular dementia without behavioral disturbance: Secondary | ICD-10-CM | POA: Diagnosis not present

## 2023-12-20 DIAGNOSIS — I639 Cerebral infarction, unspecified: Secondary | ICD-10-CM

## 2023-12-20 DIAGNOSIS — Z952 Presence of prosthetic heart valve: Secondary | ICD-10-CM | POA: Diagnosis not present

## 2023-12-20 DIAGNOSIS — R29702 NIHSS score 2: Secondary | ICD-10-CM | POA: Diagnosis not present

## 2023-12-20 DIAGNOSIS — I6340 Cerebral infarction due to embolism of unspecified cerebral artery: Secondary | ICD-10-CM | POA: Diagnosis not present

## 2023-12-20 DIAGNOSIS — S72011A Unspecified intracapsular fracture of right femur, initial encounter for closed fracture: Secondary | ICD-10-CM | POA: Insufficient documentation

## 2023-12-20 LAB — CBC
HCT: 36.4 % (ref 36.0–46.0)
Hemoglobin: 12.4 g/dL (ref 12.0–15.0)
MCH: 30.1 pg (ref 26.0–34.0)
MCHC: 34.1 g/dL (ref 30.0–36.0)
MCV: 88.3 fL (ref 80.0–100.0)
Platelets: 196 10*3/uL (ref 150–400)
RBC: 4.12 MIL/uL (ref 3.87–5.11)
RDW: 13.1 % (ref 11.5–15.5)
WBC: 9.2 10*3/uL (ref 4.0–10.5)
nRBC: 0 % (ref 0.0–0.2)

## 2023-12-20 LAB — BASIC METABOLIC PANEL WITH GFR
Anion gap: 9 (ref 5–15)
BUN: 11 mg/dL (ref 8–23)
CO2: 22 mmol/L (ref 22–32)
Calcium: 9.1 mg/dL (ref 8.9–10.3)
Chloride: 100 mmol/L (ref 98–111)
Creatinine, Ser: 0.95 mg/dL (ref 0.44–1.00)
GFR, Estimated: 60 mL/min — ABNORMAL LOW (ref >60)
Glucose, Bld: 133 mg/dL — ABNORMAL HIGH (ref 70–99)
Potassium: 3.5 mmol/L (ref 3.5–5.1)
Sodium: 131 mmol/L — ABNORMAL LOW (ref 135–145)

## 2023-12-20 MED ORDER — PREDNISOLONE ACETATE 1 % OP SUSP
1.0000 [drp] | Freq: Every morning | OPHTHALMIC | Status: DC
  Administered 2023-12-21 – 2023-12-27 (×7): 1 [drp] via OPHTHALMIC
  Filled 2023-12-20: qty 5

## 2023-12-20 NOTE — Care Management Obs Status (Signed)
 MEDICARE OBSERVATION STATUS NOTIFICATION   Patient Details  Name: [name] MRN: [id number] Date of Birth: 1941-01-22   Medicare Observation Status Notification Given:  Yes  Moon/Obs letter signed by her daughter  and copy provided  Wynonia Hedges 12/20/2023, 9:14 AM

## 2023-12-20 NOTE — Plan of Care (Signed)
  Problem: Ischemic Stroke/TIA Tissue Perfusion: Goal: Complications of ischemic stroke/TIA will be minimized Outcome: Progressing   Problem: Coping: Goal: Will identify appropriate support needs Outcome: Progressing   Problem: Health Behavior/Discharge Planning: Goal: Ability to manage health-related needs will improve Outcome: Progressing Goal: Goals will be collaboratively established with patient/family Outcome: Progressing   Problem: Self-Care: Goal: Ability to participate in self-care as condition permits will improve Outcome: Progressing   Problem: Nutrition: Goal: Risk of aspiration will decrease Outcome: Progressing Goal: Dietary intake will improve Outcome: Progressing   Problem: Education: Goal: Knowledge of General Education information will improve Description: Including pain rating scale, medication(s)/side effects and non-pharmacologic comfort measures Outcome: Progressing   Problem: Health Behavior/Discharge Planning: Goal: Ability to manage health-related needs will improve Outcome: Progressing   Problem: Clinical Measurements: Goal: Ability to maintain clinical measurements within normal limits will improve Outcome: Progressing Goal: Will remain free from infection Outcome: Progressing Goal: Diagnostic test results will improve Outcome: Progressing Goal: Respiratory complications will improve Outcome: Progressing Goal: Cardiovascular complication will be avoided Outcome: Progressing   Problem: Activity: Goal: Risk for activity intolerance will decrease Outcome: Progressing   Problem: Nutrition: Goal: Adequate nutrition will be maintained Outcome: Progressing   Problem: Coping: Goal: Level of anxiety will decrease Outcome: Progressing   Problem: Elimination: Goal: Will not experience complications related to bowel motility Outcome: Progressing Goal: Will not experience complications related to urinary retention Outcome: Progressing    Problem: Pain Managment: Goal: General experience of comfort will improve and/or be controlled Outcome: Progressing   Problem: Safety: Goal: Ability to remain free from injury will improve Outcome: Progressing   Problem: Skin Integrity: Goal: Risk for impaired skin integrity will decrease Outcome: Progressing

## 2023-12-20 NOTE — Telephone Encounter (Signed)
  [facility]   MULTIDISCIPLINARY HEART VALVE TEAM   Pt was due for 1 month follow up and echo on 12/31/23.  She is currently admitted with a stroke and had an echo completed on 12/19/23 which showed EF 65%, normally functioning TAVR with a mean gradient of 5 mmHg and no PVL. I called her daughter [name] who was in the room with her to let her know that her echo looked great and that we would cancel her upcoming outpatient echo. [name] also wanted to cancel the apt for follow up because she is going to be discharged to a rehab facility for approximately 3-4 weeks. I spoke to the patient who was doing well from a heart failure standpoint with NYHA class I symptoms. KCCQ completed below. I arranged for regular follow up with Dr. Abel Hoe in July after she recovers and rehabilitates from her stroke.    [place] Cardiomyopathy Questionnaire     12/20/2023   12:09 PM 09/07/2023   11:58 AM  KCCQ-12  1 a. Ability to shower/bathe Not at all limited Not at all limited  1 b. Ability to walk 1 block Not at all limited Not at all limited  1 c. Ability to hurry/jog Other, Did not do Other, Did not do  2. Edema feet/ankles/legs Never over the past 2 weeks 3+ times a week, not every day  3. Limited by fatigue Less than once a week At least once a day  4. Limited by dyspnea Never over the past 2 weeks Never over the past 2 weeks  5. Sitting up / on 3+ pillows Never over the past 2 weeks Never over the past 2 weeks  6. Limited enjoyment of life Moderately limited Not limited at all  7. Rest of life w/ symptoms Somewhat satisfied Mostly satisfied  8 a. Participation in hobbies N/A, did not do for other reasons Did not limit at all  8 b. Participation in chores N/A, did not do for other reasons Did not limit at all  8 c. Visiting family/friends N/A, did not do for other reasons Did not limit at all       [name] PA-C  [facility]

## 2023-12-20 NOTE — Progress Notes (Signed)
 Consult received from hospitalist for right femoral neck fx.  Reviewed imaging.  Patient has cardiac history with TAVR.  Hospitalist will consult cardiology for preop risk assessment.  Will proceed with surgery once medically optimized.  Sidonie Drape, MD [facility] 4:57 PM

## 2023-12-20 NOTE — Consult Note (Addendum)
 Cardiology Consultation   Patient ID: CIARA KAGAN MRN: [id number]; DOB: 1941-06-04  Admit date: 12/18/2023 Date of Consult: 12/20/2023  PCP:  Imelda Man, MD   [facility] Providers Cardiologist:  Antoinette Batman, MD  Structural Heart:  Antoinette Batman, MD {  Patient Profile:   AMICA HARRON is a 83 y.o. 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 Hester Lot.  History of Present Illness:   Ms. Green has history of has severe aortic stenosis documented on January 2025 echocardiogram.  Underwent right left heart catheterization demonstrating mild nonobstructive CAD with normal right heart pressures.  Echocardiogram postoperatively with preserved biventricular function and with normal functioning TAVR, mean gradient 10.  He was started on aspirin  daily.  Last seen in follow-up May 1, doing very well without any complaints.  Currently patient being evaluated for both stroke and now recent fall in the hospital resulting into right hip fracture.  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  Cardiology ordered 30-day heart monitor. Patient then eventually sustained right hip fracture while in the hospital, cardiology asked to see to preoperatively evaluate  Patient is confused, both son and daughter are present and notes that since the stroke her cognition has drastically diminished.  They also note that son was with the patient, he briefly left the room and asked staff to monitor patient.  He quickly came back to the room and she was down on the ground.  Discussed safety zone protocol with charge  nurse/director  Otherwise patient without any significant symptoms.  Post TAVR they report that she has done very well.  Even before she was active and would go walking around the neighborhood with her daughter 2-3 times per week with mild complaints of shortness of breath.  They do not note any chest pain, significant peripheral edema, shortness of breath, orthopnea, falls at home.   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 Abel Hoe & Dr. Honey Lusty   Scoliosis    Severe aortic stenosis    Wears glasses     Past Surgical History:  Procedure Laterality Date   CESAREAN SECTION  07/31/1976   TWINS, ONE WAS STILLBORN   INTRAOPERATIVE TRANSTHORACIC ECHOCARDIOGRAM N/A 11/20/2023   Procedure: ECHOCARDIOGRAM, TRANSTHORACIC;  Surgeon: Odie Benne, MD;  Location: [facility];  Service: Cardiovascular;  Laterality: N/A;   MOUTH SURGERY  07/31/1994   tongue resection   NODULE REMOVED     BENIGN, LOWER NECK   RIGHT HEART CATH AND CORONARY ANGIOGRAPHY N/A 09/21/2023   Procedure: RIGHT HEART CATH AND CORONARY ANGIOGRAPHY;  Surgeon: Odie Benne, MD;  Location: [facility];  Service: Cardiovascular;  Laterality: N/A;    Inpatient Medications: Scheduled Meds:  dorzolamide -timolol   1 drop  Left Eye Q12H   latanoprost   1 drop Left Eye QHS   levothyroxine   75 mcg Oral Q0600   memantine   5 mg Oral QHS   multivitamin with minerals  1 tablet Oral QPC lunch   rosuvastatin   5 mg Oral QPC lunch   Continuous Infusions:  PRN Meds: acetaminophen , haloperidol lactate, HYDROcodone-acetaminophen , labetalol , melatonin, polyvinyl  alcohol, prochlorperazine, senna-docusate  Allergies:    Allergies  Allergen Reactions   Brimonidine Itching    Red eyes   Fluocinolone Acetonide Other (See Comments)    Unable to recall   Naphazoline-Polyethyl Glycol     THAT YOU USE TO DILATE YOUR EYES.Aaron AasMAKES BP GO UP, CAN ONLY USE HALF STRENGTH   Triamcinolone Acetonide Other (See Comments)    Unable to recall   Tropicamide Other (See Comments)    Elevates IOP OK to use 0.5%    Social History:   Social History   Socioeconomic History   Marital status: Widowed    Spouse name: Not on file   Number of children: 3   Years of education: Not on file   Highest education level: Not on file  Occupational History   Not on file  Tobacco Use   Smoking status: Former    Current packs/day: 0.00    Average packs/day: 0.2 packs/day for 2.0 years (0.4 ttl pk-yrs)    Types: Cigarettes    Start date: 07/31/1992    Quit date: 07/31/1994    Years since quitting: 29.4   Smokeless tobacco: Never   Tobacco comments:    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  Vaping Use   Vaping status: Never Used  Substance and Sexual Activity   Alcohol use: No   Drug use: No   Sexual activity: Not on file  Other Topics Concern   Not on file  Social History Narrative   12/28/21 lives alone   Social Drivers of Health   Financial Resource Strain: Not on file  Food Insecurity: No Food Insecurity (12/18/2023)   Hunger Vital Sign    Worried About Running Out of Food in the Last Year: Never true    Ran Out of Food in the Last Year: Never true  Transportation Needs: No Transportation Needs (12/18/2023)   PRAPARE - Administrator, Civil Service (Medical): No    Lack of Transportation (Non-Medical): No  Physical Activity: Not on file  Stress: Not on file  Social Connections: Socially Isolated (12/18/2023)   Social Connection and Isolation Panel [NHANES]    Frequency of Communication with Friends and Family: Twice a week    Frequency of Social Gatherings  with Friends and Family: More than three times a week    Attends Religious Services: Never    Database administrator or Organizations: No    Attends Banker Meetings: Never    Marital Status: Widowed  Intimate Partner Violence: Not At Risk (12/18/2023)   Humiliation, Afraid, Rape, and Kick questionnaire    Fear of Current or Ex-Partner: No    Emotionally Abused: No    Physically Abused: No    Sexually Abused: No    Family History:   Family History  Problem Relation Age of Onset   Thyroid  disease Mother    Dementia Mother    Thyroid  disease Sister      ROS:  Please see the history of present illness.  All other ROS reviewed and negative.     Physical Exam/Data:   Vitals:   12/20/23 7846  12/20/23 0805 12/20/23 1215 12/20/23 1602  BP: (!) 168/80 (!) 148/83 (!) 158/104 (!) 165/82  Pulse: 83 89 66 75  Resp: 18 19 19 19   Temp: 98.6 F (37 C) 98.3 F (36.8 C) 98 F (36.7 C) 98.8 F (37.1 C)  TempSrc: Axillary Oral Oral Oral  SpO2: 92% 93% 97% 95%  Weight:      Height:        Intake/Output Summary (Last 24 hours) at 12/20/2023 1815 Last data filed at 12/20/2023 0045 Gross per 24 hour  Intake 1218.77 ml  Output 100 ml  Net 1118.77 ml      12/18/2023    3:44 PM 11/29/2023    3:45 PM 11/21/2023    4:44 AM  Last 3 Weights  Weight (lbs) 132 lb 7.9 oz 132 lb 9.6 oz 130 lb 4.7 oz  Weight (kg) 60.1 kg 60.147 kg 59.1 kg     Body mass index is 21.39 kg/m.  General:  Well nourished, well developed, in no acute distress. Confused  HEENT: normal Neck: no JVD Vascular: No carotid bruits; Distal pulses 2+ bilaterally Cardiac:  normal S1, S2; RRR;.  Mechanical valve murmur Lungs:  clear to auscultation bilaterally, no wheezing, rhonchi or rales  Abd: soft, nontender, no hepatomegaly  Ext: no edema Musculoskeletal:  No deformities, BUE and BLE strength normal and equal Skin: warm and dry  Neuro:  CNs 2-12 intact, no focal abnormalities noted Psych:  Normal affect    EKG:  The EKG was personally reviewed and demonstrates: Sinus rhythm heart rate 71 no acute ST-T wave changes.  Small lateral Q waves appreciated, Telemetry:  Telemetry was personally reviewed and demonstrates: Sinus rhythm heart rate 60-70  Relevant CV Studies: Echocardiogram 12/19/2023 1. Left ventricular ejection fraction, by estimation, is 65 to 70%. The  left ventricle has normal function. The left ventricle has no regional  wall motion abnormalities. Left ventricular diastolic parameters are  consistent with Grade I diastolic  dysfunction (impaired relaxation). Elevated left ventricular end-diastolic  pressure.   2. Right ventricular systolic function is normal. The right ventricular  size is normal.   3. The mitral valve is normal in structure. No evidence of mitral valve  regurgitation. No evidence of mitral stenosis.   4. The aortic valve is normal in structure. Aortic valve regurgitation is  mild. No aortic stenosis is present. Echo findings are consistent with  normal structure and function of the aortic valve prosthesis. Aortic valve  area, by VTI measures 2.29 cm.  Aortic valve mean gradient measures 5.0 mmHg. Aortic valve Vmax measures  1.48 m/s.   5. The inferior vena cava is normal in size with greater than 50%  respiratory variability, suggesting right atrial pressure of 3 mmHg.   Right left heart catheterization 09/21/2023   Prox LAD to Mid LAD lesion is 20% stenosed.   Mild non-obstructive CAD Normal right heart pressures   Recommendations: Will continue workup for TAVR.   Laboratory Data:  High Sensitivity Troponin:  No results for input(s): "TROPONINIHS" in the last 720 hours.   Chemistry Recent Labs  Lab 12/18/23 1535 12/19/23 0422 12/20/23 0958  NA 135 135 131*  K 3.8 3.8 3.5  CL 103 103 100  CO2 21* 21* 22  GLUCOSE 85 76 133*  BUN 20 15 11   CREATININE 1.05* 0.85 0.95  CALCIUM  9.4 9.0 9.1  MG 2.1 2.0  --   GFRNONAA 53* >60 60*  ANIONGAP 11  11 9     Recent Labs  Lab 12/18/23 1535  PROT 6.4*  ALBUMIN  3.5  AST 28  ALT 17  ALKPHOS 51  BILITOT 0.9   Lipids  Recent Labs  Lab 12/19/23 0422  CHOL 131  TRIG 46  HDL 77  LDLCALC 45  CHOLHDL 1.7    Hematology Recent Labs  Lab 12/18/23 1535 12/19/23 0422 12/20/23 0958  WBC 9.7 7.4 9.2  RBC 3.90 3.83* 4.12  HGB 11.9* 11.7* 12.4  HCT 36.1 35.8* 36.4  MCV 92.6 93.5 88.3  MCH 30.5 30.5 30.1  MCHC 33.0 32.7 34.1  RDW 13.3 13.2 13.1  PLT 209 204 196   Thyroid  No results for input(s): "TSH", "FREET4" in the last 168 hours.  BNPNo results for input(s): "BNP", "PROBNP" in the last 168 hours.  DDimer No results for input(s): "DDIMER" in the last 168 hours.   Radiology/Studies:  CT HEAD WO CONTRAST ( ) Result Date: 12/20/2023 EXAM: CT HEAD WITHOUT 12/20/2023 11:59:13 AM TECHNIQUE: CT of the head was performed without the administration of intravenous contrast. Automated exposure control, iterative reconstruction, and/or weight based adjustment of the mA/kV was utilized to reduce the radiation dose to as low as reasonably achievable. COMPARISON: CT angiography of the head and neck and noncontrast head CT on 12/19/2023, and MR head without contrast on 12/18/2023. CLINICAL HISTORY: Head trauma, minor (Age >= 65y). Chief complaints; Altered Mental Status. FINDINGS: BRAIN AND VENTRICLES: There is no acute intracranial hemorrhage, mass effect or midline shift. No abnormal extra-axial fluid collection. The gray-white differentiation is maintained without evidence of an acute infarct. There is no evidence of hydrocephalus. Confluent periventricular and subcortical white matter disease is stable . ORBITS: The visualized portion of the orbits demonstrate no acute abnormality. SINUSES: The visualized paranasal sinuses and mastoid air cells demonstrate no acute abnormality. SOFT TISSUES AND SKULL: No acute abnormality of the visualized skull or soft tissues. VASCULATURE: Atherosclerotic  calcifications are present in the cavernous carotid arteries bilaterally. No hyperdense vessel is present. IMPRESSION: 1. No acute intracranial abnormality related to the minor head trauma and altered mental status. 2. Stable confluent periventricular and subcortical white matter disease. Electronically signed by: Audree Leas MD 12/20/2023 04:35 PM EDT RP Workstation: [id number]   DG FEMUR PORT, MIN 2 VIEWS RIGHT Result Date: 12/20/2023 CLINICAL DATA:  Fall.  Right hip pain. EXAM: RIGHT FEMUR PORTABLE 2 VIEW; DG HIP (WITH OR WITHOUT PELVIS) 2-3V RIGHT COMPARISON:  Pelvis and right hip radiographs 12/19/2023 FINDINGS: There is diffuse decreased bone mineralization. There is a new acute fracture of the mid right femoral neck with mild superior displacement of the distal fracture component and mild varus angulation. The bilateral femoral heads remain normally located. Mild bilateral superolateral acetabular degenerative osteophytosis. Mild bilateral sacroiliac subchondral sclerosis. Normal alignment at the knee. Mild medial compartment of the knee joint space narrowing. IMPRESSION: Acute mildly displaced and angulated fracture of the mid right femoral neck. This was not present on yesterday's radiographs. Electronically Signed   By: Bertina Broccoli M.D.   On: 12/20/2023 12:12   DG HIP UNILAT WITH PELVIS 2-3 VIEWS RIGHT Result Date: 12/20/2023 CLINICAL DATA:  Fall.  Right hip pain. EXAM: RIGHT FEMUR PORTABLE 2 VIEW; DG HIP (WITH OR WITHOUT PELVIS) 2-3V RIGHT COMPARISON:  Pelvis and right hip radiographs 12/19/2023 FINDINGS: There is diffuse decreased bone mineralization. There is a new acute fracture of the mid right femoral neck with mild superior displacement of the distal fracture component and mild varus angulation. The bilateral femoral heads remain normally located. Mild bilateral superolateral acetabular  degenerative osteophytosis. Mild bilateral sacroiliac subchondral sclerosis. Normal alignment at  the knee. Mild medial compartment of the knee joint space narrowing. IMPRESSION: Acute mildly displaced and angulated fracture of the mid right femoral neck. This was not present on yesterday's radiographs. Electronically Signed   By: Bertina Broccoli M.D.   On: 12/20/2023 12:12   DG HIP PORT UNILAT WITH PELVIS 1V RIGHT Result Date: 12/19/2023 CLINICAL DATA:  Right hip pain. EXAM: DG HIP (WITH OR WITHOUT PELVIS) 1V PORT RIGHT COMPARISON:  None Available. FINDINGS: Mildly decreased bone mineralization. Mild bilateral sacroiliac subchondral sclerosis. The bilateral femoroacetabular joint spaces are maintained. Mild superior pubic symphysis degenerative spurring. Moderate severe left L4-5 and moderate left L3-4 disc space narrowing. No acute fracture or dislocation. A vascular phlebolith overlies the left hemipelvis. IMPRESSION: 1. Mild bilateral sacroiliac osteoarthritis. 2. Moderate to severe left L4-5 and moderate left L3-4 degenerative disc and endplate changes. Electronically Signed   By: Bertina Broccoli M.D.   On: 12/19/2023 21:02   ECHOCARDIOGRAM COMPLETE Result Date: 12/19/2023    ECHOCARDIOGRAM REPORT   Patient Name:   MIKEISHA LEMONDS Date of Exam: 12/19/2023 Medical Rec #:  [id number]     Height:       66.0 in Accession #:    [id number]    Weight:       132.5 lb Date of Birth:  30-Sep-1940     BSA:          1.679 m Patient Age:    82 years      BP:           171/67 mmHg Patient Gender: F             HR:           68 bpm. Exam Location:  Inpatient Procedure: 2D Echo, Cardiac Doppler and Color Doppler (Both Spectral and Color            Flow Doppler were utilized during procedure). Indications:    Stroke  History:        Patient has prior history of Echocardiogram examinations, most                 recent 11/21/2023.  Sonographer:    Janette Medley Referring Phys: CAROLE N HALL IMPRESSIONS  1. Left ventricular ejection fraction, by estimation, is 65 to 70%. The left ventricle has normal function. The left ventricle  has no regional wall motion abnormalities. Left ventricular diastolic parameters are consistent with Grade I diastolic dysfunction (impaired relaxation). Elevated left ventricular end-diastolic pressure.  2. Right ventricular systolic function is normal. The right ventricular size is normal.  3. The mitral valve is normal in structure. No evidence of mitral valve regurgitation. No evidence of mitral stenosis.  4. The aortic valve is normal in structure. Aortic valve regurgitation is mild. No aortic stenosis is present. Echo findings are consistent with normal structure and function of the aortic valve prosthesis. Aortic valve area, by VTI measures 2.29 cm. Aortic valve mean gradient measures 5.0 mmHg. Aortic valve Vmax measures 1.48 m/s.  5. The inferior vena cava is normal in size with greater than 50% respiratory variability, suggesting right atrial pressure of 3 mmHg. FINDINGS  Left Ventricle: Left ventricular ejection fraction, by estimation, is 65 to 70%. The left ventricle has normal function. The left ventricle has no regional wall motion abnormalities. The left ventricular internal cavity size was normal in size. There is  no left ventricular hypertrophy. Left ventricular diastolic parameters are consistent  with Grade I diastolic dysfunction (impaired relaxation). Elevated left ventricular end-diastolic pressure. Right Ventricle: The right ventricular size is normal. No increase in right ventricular wall thickness. Right ventricular systolic function is normal. Left Atrium: Left atrial size was normal in size. Right Atrium: Right atrial size was normal in size. Pericardium: There is no evidence of pericardial effusion. Mitral Valve: The mitral valve is normal in structure. No evidence of mitral valve regurgitation. No evidence of mitral valve stenosis. MV peak gradient, 7.5 mmHg. The mean mitral valve gradient is 5.0 mmHg. Tricuspid Valve: The tricuspid valve is normal in structure. Tricuspid valve  regurgitation is not demonstrated. No evidence of tricuspid stenosis. Aortic Valve: The aortic valve is normal in structure. Aortic valve regurgitation is mild. No aortic stenosis is present. Aortic valve mean gradient measures 5.0 mmHg. Aortic valve peak gradient measures 8.8 mmHg. Aortic valve area, by VTI measures 2.29 cm. 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 Echo findings are consistent with normal structure and function of the aortic valve prosthesis. Pulmonic Valve: The pulmonic valve was normal in structure. Pulmonic valve regurgitation is mild. No evidence of pulmonic stenosis. Aorta: The aortic root is normal in size and structure. Venous: The inferior vena cava is normal in size with greater than 50% respiratory variability, suggesting right atrial pressure of 3 mmHg. IAS/Shunts: No atrial level shunt detected by color flow Doppler.  LEFT VENTRICLE PLAX 2D LVIDd:         3.40 cm   Diastology LVIDs:         2.10 cm   LV e' medial:    7.83 cm/s LV PW:         1.00 cm   LV E/e' medial:  14.3 LV IVS:        1.10 cm   LV e' lateral:   8.16 cm/s LVOT diam:     1.70 cm   LV E/e' lateral: 13.7 LV SV:         83 LV SV Index:   49 LVOT Area:     2.27 cm  RIGHT VENTRICLE             IVC RV S prime:     13.70 cm/s  IVC diam: 1.30 cm TAPSE (M-mode): 2.4 cm LEFT ATRIUM             Index        RIGHT ATRIUM           Index LA diam:        3.60 cm 2.14 cm/m   RA Area:     11.40 cm LA Vol (A2C):   22.4 ml 13.34 ml/m  RA Volume:   24.80 ml  14.77 ml/m LA Vol (A4C):   25.9 ml 15.43 ml/m LA Biplane Vol: 23.8 ml 14.18 ml/m  AORTIC VALVE AV Area (Vmax):    2.24 cm AV Area (Vmean):   2.34 cm AV Area (VTI):     2.29 cm AV Vmax:           148.50 cm/s AV Vmean:          98.900 cm/s AV VTI:            0.362 m AV Peak Grad:      8.8 mmHg AV Mean Grad:      5.0 mmHg LVOT Vmax:         146.50 cm/s LVOT Vmean:  102.000 cm/s LVOT VTI:          0.365 m LVOT/AV VTI ratio:  1.01  AORTA Ao Asc diam: 2.70 cm MITRAL VALVE MV Area (PHT): 3.03 cm     SHUNTS MV Area VTI:   1.85 cm     Systemic VTI:  0.36 m MV Peak grad:  7.5 mmHg     Systemic Diam: 1.70 cm MV Mean grad:  5.0 mmHg MV Vmax:       1.37 m/s MV Vmean:      106.0 cm/s MV Decel Time: 250 msec MV E velocity: 112.00 cm/s MV A velocity: 124.00 cm/s MV E/A ratio:  0.90 Maudine Sos MD Electronically signed by Maudine Sos MD Signature Date/Time: 12/19/2023/12:25:33 PM    Final    CT ANGIO HEAD NECK W WO CM Result Date: 12/19/2023 CLINICAL DATA:  Stroke, determine embolic source EXAM: CT ANGIOGRAPHY HEAD AND NECK WITH AND WITHOUT CONTRAST TECHNIQUE: Multidetector CT imaging of the head and neck was performed using the standard protocol during bolus administration of intravenous contrast. Multiplanar CT image reconstructions and MIPs were obtained to evaluate the vascular anatomy. Carotid stenosis measurements (when applicable) are obtained utilizing NASCET criteria, using the distal internal carotid diameter as the denominator. RADIATION DOSE REDUCTION: This exam was performed according to the departmental dose-optimization program which includes automated exposure control, adjustment of the mA and/or kV according to patient size and/or use of iterative reconstruction technique. CONTRAST:  75mL OMNIPAQUE  IOHEXOL  350 MG/ML SOLN COMPARISON:  Brain MRI from yesterday FINDINGS: CT HEAD FINDINGS Brain: The patient's acute infarct is occult by noncontrast CT. Cerebral volume loss and extensive chronic small vessel ischemia. No intracranial hemorrhage Vascular: No hyperdense vessel or unexpected calcification. 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 Review of the MIP images confirms the above findings CTA NECK FINDINGS Aortic arch: Extensive atheromatous plaque. 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 Right carotid system: No flow reducing stenosis or ulceration  seen. Suboptimal opacification due to preferential venous timing. Left carotid system: Scattered atheromatous plaque, greatest calcification at the bifurcation. No stenosis or ulceration is seen. Vertebral arteries: No proximal subclavian stenosis. Left dominant vertebral artery. No evidence of flow reducing stenosis, beading, or dissection in the vertebral arteries - limited by contrast timing. Skeleton: Generalized cervical spine degeneration. Other neck: No acute finding.  Absent right submandibular gland Upper chest: Pleural based scarring with calcification at the apices. Review of the MIP images confirms the above findings CTA HEAD FINDINGS Anterior circulation: Extensive atheromatous calcification along the carotid siphons. No branch occlusion, beading, or aneurysm. Limited in evaluating peripheral branches due to bolus quality. Posterior circulation: The vertebral and basilar arteries are sufficiently patent. Fetal type left PCA. No branch occlusion or evidence of proximal flow reducing stenosis. No evidence of aneurysm Venous sinuses: Diffusely patent Review of the MIP images confirms the above findings IMPRESSION: Suboptimal CTA due to bolus timing. No emergent finding. Atherosclerosis without flow reducing stenosis or irregularity involving major arteries in the head and neck. Electronically Signed   By: Ronnette Coke M.D.   On: 12/19/2023 08:36   MR BRAIN WO CONTRAST Result Date: 12/18/2023 CLINICAL DATA:  Initial evaluation for acute neuro deficit, stroke suspected. EXAM: MRI HEAD WITHOUT CONTRAST TECHNIQUE: Multiplanar, multiecho pulse sequences of the brain and surrounding structures were obtained without intravenous contrast. COMPARISON:  CT from earlier the same day. FINDINGS: Brain: Examination degraded by motion. Generalized age-related cerebral atrophy. Patchy T2/FLAIR hyperintensity involving the supratentorial  cerebral white matter and pons, consistent with chronic small vessel ischemic  disease, moderately advanced in nature. Punctate focus of restricted diffusion seen involving the high left frontal lobe, consistent with a tiny acute ischemic nonhemorrhagic infarct (series 2, image 42). No other evidence for acute or subacute ischemia. No areas of chronic cortical infarction. No acute or significant chronic intracranial blood products. No mass lesion, midline shift or mass effect. No hydrocephalus or extra-axial fluid collection. Pituitary gland within normal limits. Vascular: Major intracranial vascular flow voids are maintained. Skull and upper cervical spine: Craniocervical junction within normal limits. Bone marrow signal intensity normal. No scalp soft tissue abnormality. Sinuses/Orbits: Prior bowel ocular lens replacement. Paranasal sinuses are largely clear. No significant mastoid effusion. Other: None. IMPRESSION: 1. 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 2. Underlying age-related cerebral atrophy with moderately advanced chronic microvascular ischemic disease. Electronically Signed   By: Virgia Griffins M.D.   On: 12/18/2023 20:33   CT HEAD WO CONTRAST Result Date: 12/18/2023 CLINICAL DATA:  Mental status change, unknown cause. EXAM: CT HEAD WITHOUT CONTRAST TECHNIQUE: Contiguous axial images were obtained from the base of the skull through the vertex without intravenous contrast. RADIATION DOSE REDUCTION: This exam was performed according to the departmental dose-optimization program which includes automated exposure control, adjustment of the mA and/or kV according to patient size and/or use of iterative reconstruction technique. COMPARISON:  MRI head from 01/15/2022. FINDINGS: Brain: No evidence of acute infarction, hemorrhage, hydrocephalus, extra-axial collection or mass lesion/mass effect. There is bilateral periventricular hypodensity, which is non-specific but most likely seen in the settings of microvascular ischemic changes.  Moderate in extent. otherwise normal appearance of brain parenchyma. Ventricles are prominent but cerebral volume is age appropriate. Vascular: No hyperdense vessel or unexpected calcification. Intracranial arteriosclerosis. Skull: Normal. Negative for fracture or focal lesion. Sinuses/Orbits: No acute finding. Hyperdense implant noted in the right orbit. Other: Visualized mastoid air cells are unremarkable. No mastoid effusion. IMPRESSION: *No acute intracranial abnormality. Electronically Signed   By: Beula Brunswick M.D.   On: 12/18/2023 16:10   DG Chest Port 1 View Result Date: 12/18/2023 CLINICAL DATA:  Weakness. EXAM: PORTABLE CHEST 1 VIEW COMPARISON:  11/16/2023. FINDINGS: Biapical pleural thickening noted. Bilateral lung fields are otherwise clear. No acute consolidation or lung collapse. Bilateral costophrenic angles are clear. Stable cardio-mediastinal silhouette. 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 No acute osseous abnormalities. The soft tissues are within normal limits. IMPRESSION: No active disease. Electronically Signed   By: Beula Brunswick M.D.   On: 12/18/2023 16:08     Assessment and Plan:   Preoperative evaluation for right femoral fracture secondary to mechanical fall Patient currently admitted for stroke, sustained fall resulting into right femoral hip fracture while inpatient.  From a cardiac standpoint overall seems to have stable disease with recent reassuring workup.  She has mild nonobstructive CAD noted on very recent cardiac catheterization, has preserved biventricular function with stable valve function.  Her RCRI is 6%.  Not able to complete greater than 4 METS though.  Overall would classify as intermediate risk for an intermediate risk procedure, no further cardiac workup recommended prior to procedure.  Agree with holding Plavix, would recommend starting aspirin  81 mg daily perioperatively.  Acute CVA MRI revealing small left frontal punctate infarct.  Managed by neurology.   Felt to be cardioembolic.  30-day heart monitor already ordered.  No evidence of atrial fibrillation here.  Potentially could be secondary to valve.  Status post recent TAVR 10/2023 Stable  function on most recent echocardiogram.  Hypertension Avoiding antihypertensives to allow for permissive hypertension.  Abnormal lung CT/pulmonary nodule She was referred to pulmonology.  Further workup per primary team.  Risk Assessment/Risk Scores:   For questions or updates, please contact [facility] Please consult [email or web address] for contact info under    Signed, Burnetta Cart, PA-C  12/20/2023 6:15 PM   Patient seen and examined.  Agree with above documentation.  Ms. Lansberry is an 83 year old female with a history of severe aortic stenosis status post TAVR 10/2023, nonobstructive CAD, COPD, hypertension, hyperlipidemia, hypothyroidism, dementia reconsulted by Dr. Hester Lot for preoperative evaluation.  Echocardiogram 08/2023 showed severe aortic stenosis, normal biventricular function.   LHC/RHC 09/21/2023 showed mild nonobstructive CAD, normal right heart pressures.  Underwent TAVR on 11/20/2023.  Post TAVR echo showed EF 70 to 75%, normal RV function, normal functioning TAVR valve.  She was admitted yesterday with confusion.  Incidentally found to have punctate left frontal stroke.  Neurology consulted, recommend aspirin  and Plavix 75 mg daily for 3 weeks and then Plavix alone.  Echocardiogram yesterday showed EF 65 to 70%, normal RV function, mild aortic regurgitation, normal functioning TAVR valve.  Fortunately had fall out of her bed and suffered right hip fracture.  Orthopedic surgery consulted, planning surgery tomorrow.  On exam, patient is confused (oriented to person only), regular rate and rhythm, 2/6 systolic murmur, lungs CTAB, no LE edema or JVD.  For her preop evaluation, had recent catheterization with nonobstructive CAD.  Echocardiogram shows TAVR valve is functioning normally, normal  biventricular function.  No further cardiac workup recommended prior to surgery.  Wendie Hamburg, MD

## 2023-12-20 NOTE — Progress Notes (Signed)
 PT Cancellation Note  Patient Details Name: [name] MRN: [id number] DOB: 06/22/1941   Cancelled Treatment:    Reason Eval/Treat Not Completed: (P) Patient at procedure or test/unavailable (note new imaging results from hip after fall previous evening in hospital and pt currently off unit for CT head, defer PT session for pt safety at this time and pt likely to need PT re-evaluation next session.)   Arville Laughter 12/20/2023, 1:52 PM

## 2023-12-20 NOTE — Evaluation (Signed)
 SLP Cancellation Note  Patient Details Name: [name] MRN: [id number] DOB: September 06, 1940   Cancelled treatment:       Reason Eval/Treat Not Completed: Other (comment) (family currently speaking to RN at this time; note results of hip imaging- uncertain if family aware of results.  RN advised them to be expecting call from MD.  In light of this, will defer and continue efforts.)   Chantal Comment 12/20/2023, 3:32 PM

## 2023-12-20 NOTE — NC FL2 (Signed)
 [place]  MEDICAID FL2 LEVEL OF CARE FORM     IDENTIFICATION  Patient Name: [name] Birthdate: 06/01/1941 Sex: female Admission Date (Current Location): 12/18/2023  [facility] and [place] Number:  Producer, television/film/video and Address:  [facility], [address] [place] [employer] [place], [place] [address]      Provider Number: [id number]  Attending Physician Name and Address:  Veronica Gordon, MD  Relative Name and Phone Number:  [facility] Daughter [phone]    Current Level of Care: Hospital Recommended Level of Care: Skilled Nursing Facility Prior Approval Number:    Date Approved/Denied:   PASRR Number:  [id number]  Discharge Plan: SNF    Current Diagnoses: Patient Active Problem List   Diagnosis Date Noted   Acute CVA (cerebrovascular accident) (HCC) 12/18/2023   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   Hypothyroid    Hypertension    Severe aortic stenosis 11/08/2023   MCI (mild cognitive impairment) 05/25/2020   COVID-19 virus infection 05/25/2020   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   Pseudophakia 11/01/2012   COPD (chronic obstructive pulmonary disease) (HCC) 07/05/2012   Multiple thyroid  nodules 01/01/2012    Orientation RESPIRATION BLADDER Height & Weight     Self  Normal Continent Weight: 60.1 kg Height:  5\' 6"  (167.6 cm)  BEHAVIORAL SYMPTOMS/MOOD NEUROLOGICAL BOWEL NUTRITION STATUS      Continent Diet (heart healthy with thin liquids)  AMBULATORY STATUS COMMUNICATION OF NEEDS Skin     Verbally Normal                       Personal Care Assistance Level of Assistance              Functional Limitations Info  Sight, Hearing, Speech Sight Info: Adequate Hearing Info: Adequate Speech Info: Adequate    SPECIAL CARE FACTORS FREQUENCY  PT (By  licensed PT), OT (By licensed OT)     PT Frequency: 5x/wk OT Frequency: 5x/wk            Contractures Contractures Info: Not present    Additional Factors Info  Code Status, Allergies, Psychotropic Code Status Info: Full Allergies Info: Brimonidine/ Fluocinolone Acetonide/ naphazoline-polyehtyl Glycol/ Triamcinolone Acetonide/ Tropicanmide Psychotropic Info: Namenda  5 mg at bedtime         Current Medications (12/20/2023):  This is the current hospital active medication list Current Facility-Administered Medications  Medication Dose Route Frequency Provider Last Rate Last Admin   acetaminophen  (TYLENOL ) tablet 650 mg  650 mg Oral Q6H PRN Reesa Cannon N, DO       aspirin  chewable tablet 81 mg  81 mg Oral Daily Jonette Nestle A, NP   81 mg at 12/20/23 1021   clopidogrel (PLAVIX) tablet 75 mg  75 mg Oral Daily Jonette Nestle A, NP   75 mg at 12/20/23 1020   dorzolamide -timolol  (COSOPT ) 2-0.5 % ophthalmic solution 1 drop  1 drop Left Eye Q12H Ezenduka, Nkeiruka J, MD   1 drop at 12/20/23 1023   enoxaparin  (LOVENOX ) injection 40 mg  40 mg Subcutaneous Q24H Reesa Cannon N, DO   40 mg at 12/19/23 2211   haloperidol lactate (HALDOL) injection 1 mg  1 mg Intravenous Q6H PRN Sundil, Subrina, MD   1 mg at 12/19/23 2311   HYDROcodone-acetaminophen  (NORCO/VICODIN) 5-325 MG per tablet 1 tablet  1 tablet Oral Q6H PRN Ezenduka, Nkeiruka J,  MD       labetalol  (NORMODYNE ) injection 5 mg  5 mg Intravenous Q2H PRN Reesa Cannon N, DO       latanoprost  (XALATAN ) 0.005 % ophthalmic solution 1 drop  1 drop Left Eye QHS Ezenduka, Nkeiruka J, MD       levothyroxine  (SYNTHROID ) tablet 75 mcg  75 mcg Oral Q0600 Bary Boss, DO   75 mcg at 12/20/23 1610   melatonin tablet 5 mg  5 mg Oral QHS PRN Bary Boss, DO   5 mg at 12/19/23 2212   memantine  (NAMENDA ) tablet 5 mg  5 mg Oral QHS Ezenduka, Nkeiruka J, MD   5 mg at 12/19/23 2212   multivitamin with minerals tablet 1 tablet  1 tablet Oral QPC lunch Reesa Cannon N, DO   1 tablet at 12/20/23 1254   polyvinyl alcohol (LIQUIFILM TEARS) 1.4 % ophthalmic solution 1 drop  1 drop Both Eyes PRN Ezenduka, Nkeiruka J, MD       prochlorperazine (COMPAZINE) injection 5 mg  5 mg Intravenous Q6H PRN Reesa Cannon N, DO       rosuvastatin  (CRESTOR ) tablet 5 mg  5 mg Oral QPC lunch Ferron, Carole N, DO   5 mg at 12/20/23 1254   senna-docusate (Senokot-S) tablet 1 tablet  1 tablet Oral QHS PRN Hall, Carole N, DO         Discharge Medications: Please see discharge summary for a list of discharge medications.  Relevant Imaging Results:  Relevant Lab Results:   Additional Information SS#: [id number]  Jonathan Neighbor, RN

## 2023-12-20 NOTE — Progress Notes (Signed)
 Patient is back to the unit.

## 2023-12-20 NOTE — Progress Notes (Signed)
   12/19/23 2131  What Happened  Was fall witnessed? No  Was patient injured? Unsure  Patient found on floor  Found by Staff-comment  Stated prior activity other (comment)  Provider Notification  Provider Name/Title S.Sundil (in bed confusion)  Date Provider Notified 12/19/23  Time Provider Notified 2148  Method of Notification Call  Notification Reason Fall  Provider response Evaluate remotely;See new orders  Date of Provider Response 12/20/23  Time of Provider Response 2158  Follow Up  Family notified Yes - comment  Time family notified  (POA at bedside)  Additional tests No  Simple treatment Ice  Adult Fall Risk Assessment  Risk Factor Category (scoring not indicated) High fall risk per protocol (document High fall risk)  Patient Fall Risk Level High fall risk  Adult Fall Risk Interventions  Required Bundle Interventions *See Row Information* High fall risk - low, moderate, and high requirements implemented  Additional Interventions Use of appropriate toileting equipment (bedpan, BSC, etc.)  Fall intervention(s) refused/Patient educated regarding refusal Bed alarm;Yellow bracelet  Screening for Fall Injury Risk (To be completed on HIGH fall risk patients) - Assessing Need for Floor Mats  Risk For Fall Injury- Criteria for Floor Mats Admitted as a result of a fall  Will Implement Floor Mats Yes  Vitals  Temp 97.9 F (36.6 C)  Temp Source Oral  BP (!) 142/73  MAP (mmHg) 95  BP Location Right Arm  BP Method Automatic  Patient Position (if appropriate) Lying  Pulse Rate 71  Pulse Rate Source Dinamap  Resp 18  Oxygen Therapy  SpO2 96 %  O2 Device Room Air  Pain Assessment  Pain Scale 0-10  Pain Score 0  Neurological  Neuro (WDL) X  Level of Consciousness Alert  Orientation Level Oriented to person;Disoriented to place;Disoriented to time;Disoriented to situation  Cognition Follows commands;Impulsive;Poor attention/concentration  Speech Clear  R Pupil Size (mm) 3   R Pupil Reaction Brisk  L Pupil Size (mm) 3  L Pupil Reaction Brisk  Motor Function/Sensation Assessment Grip  R Hand Grip Moderate  L Hand Grip Moderate  R Foot Dorsiflexion Moderate  L Foot Dorsiflexion Moderate  RUE Motor Response Purposeful movement  LUE Motor Response Purposeful movement  RLE Motor Response Purposeful movement  LLE Motor Response Purposeful movement  Glasgow Coma Scale  Eye Opening 4  Best Verbal Response (NON-intubated) 4  Best Motor Response 6  Glasgow Coma Scale Score 14  NIH Stroke Scale   Dizziness Present No  Headache Present No  Interval Other (Comment)  Level of Consciousness (1a.)    0  LOC Questions (1b. )    1  LOC Commands (1c. )    0  Best Gaze (2. )   0  Visual (3. )   1  Facial Palsy (4. )     0  Motor Arm, Left (5a. )    0  Motor Arm, Right (5b. )  0  Motor Leg, Left (6a. )   0  Motor Leg, Right (6b. )  0  Limb Ataxia (7. ) 0  Sensory (8. )   0  Best Language (9. )   0  Dysarthria (10. ) 0  Extinction/Inattention (11.)    0  Complete NIHSS TOTAL 2  Musculoskeletal  Musculoskeletal (WDL) X  Assistive Device Front wheel walker  Generalized Weakness Yes  Weight Bearing Restrictions Per Provider Order No  Integumentary  Integumentary (WDL) WDL

## 2023-12-20 NOTE — Telephone Encounter (Signed)
 Hospitalist called from [facility]  She needs you to call her on her cell #[phone] concerning a fracture.

## 2023-12-20 NOTE — Progress Notes (Signed)
 PROGRESS NOTE  [name] NGE:[id number] DOB: 20-Sep-1940 DOA: 12/18/2023 PCP: Imelda Man, MD  HPI/Recap of past 24 hours: [name] is a 83 y.o. 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  Associated with unsteady gait, which is not her baseline.  Family thought that she was having a urinary tract infection or dehydration and finally decided to bring her to the ER for further evaluation.  EMS was activated. In the ER, hypertensive, UA negative for pyuria and euvolemic on exam.  Noncontrast head CT did not show any acute intracranial abnormality however MRI brain showed punctate acute ischemic nonhemorrhagic infarct involving the left frontal lobe.  Underlying age-related cerebral atrophy with moderately advanced chronic microvascular ischemic disease. Neurology/stroke team was consulted. Stroke workup was initiated.  Admitted by [facility], hospitalist service.      Overnight, patient fell out of bed, son found patient laying on the floor in the room. Today pt noted to be in pain around her R hip and leg.  Discussed extensively with daughter.    Assessment/Plan: Principal Problem:   Acute CVA (cerebrovascular accident) (HCC)  Acute CVA, POA Brain MRI revealed small left high frontal punctate stroke (recent TAVR procedure) CT angio head and neck with no emergent finding although suboptimal CT due to bolus timing Echo showed EF of 65 to 70%, no regional wall motion abnormality, grade 1 diastolic dysfunction, no atrial level shunt detected LDL 45, A1c 5 Permissive hypertension Neurology/stroke team recommend aspirin  and clopidogrel for 3 weeks, then clopidogrel alone.  Recommend 30-day heart monitor after discharge (cardiology notified) Continue neurochecks and monitor  on telemetry PT/OT Stroke team following Fall precautions  Right hip fracture 2/2 fall Fell on 5/21 here in the hospital Noted significant pain upon movement around right hip and some bruising Right x-ray hip showed acute fracture, mildly displaced Orthopedics consulted on 5/22, recommend cardiology clearance, plan for surgery on 5/23 Cardiology notified about clearance, will see on 5/23   Vascular dementia Continue Namenda  Reorient as needed Fall precautions   Hypothyroidism Resume home levothyroxine    Hyperlipidemia Resume home Crestor   Hypertension Allow for permissive hypertension      Estimated body mass index is 21.39 kg/m as calculated from the following:   Height as of this encounter: 5\' 6"  (1.676 m).   Weight as of this encounter: 60.1 kg.     Code Status: Full  Family Communication: Discussed with daughter at bedside  Disposition Plan: Status is: Observation The patient will require care spanning > 2 midnights and should be moved to inpatient because: level of care      Consultants: Neurology/stroke team Orthopedics  Procedures: None  Antimicrobials: None  DVT prophylaxis: Lovenox    Objective: Vitals:   12/20/23 0521 12/20/23 0805 12/20/23 1215 12/20/23 1602  BP: (!) 168/80 (!) 148/83 (!) 158/104 (!) 165/82  Pulse: 83 89 66 75  Resp: 18 19 19 19   Temp: 98.6 F (37 C) 98.3 F (36.8 C) 98 F (36.7 C) 98.8 F (37.1 C)  TempSrc: Axillary Oral Oral Oral  SpO2: 92% 93% 97% 95%  Weight:      Height:        Intake/Output Summary (Last 24 hours) at 12/20/2023 1713 Last data filed at 12/20/2023 0045 Gross per 24 hour  Intake 1218.77 ml  Output 100  ml  Net 1118.77 ml   Filed Weights   12/18/23 1544  Weight: 60.1 kg    Exam:  General: NAD  Cardiovascular: S1, S2 present Respiratory: CTAB Abdomen: Soft, nontender, nondistended, bowel sounds present Musculoskeletal: No bilateral pedal edema noted Skin: Normal Psychiatry: Fair  mood   Data Reviewed: CBC: Recent Labs  Lab 12/18/23 1535 12/19/23 0422 12/20/23 0958  WBC 9.7 7.4 9.2  NEUTROABS 7.6  --   --   HGB 11.9* 11.7* 12.4  HCT 36.1 35.8* 36.4  MCV 92.6 93.5 88.3  PLT 209 204 196   Basic Metabolic Panel: Recent Labs  Lab 12/18/23 1535 12/19/23 0422 12/20/23 0958  NA 135 135 131*  K 3.8 3.8 3.5  CL 103 103 100  CO2 21* 21* 22  GLUCOSE 85 76 133*  BUN 20 15 11   CREATININE 1.05* 0.85 0.95  CALCIUM  9.4 9.0 9.1  MG 2.1 2.0  --   PHOS  --  2.4*  --    GFR: Estimated Creatinine Clearance: 42.7 mL/min (by C-G formula based on SCr of 0.95 mg/dL). Liver Function Tests: Recent Labs  Lab 12/18/23 1535  AST 28  ALT 17  ALKPHOS 51  BILITOT 0.9  PROT 6.4*  ALBUMIN  3.5   No results for input(s): "LIPASE", "AMYLASE" in the last 168 hours. Recent Labs  Lab 12/18/23 1535  AMMONIA <13   Coagulation Profile: No results for input(s): "INR", "PROTIME" in the last 168 hours. Cardiac Enzymes: No results for input(s): "CKTOTAL", "CKMB", "CKMBINDEX", "TROPONINI" in the last 168 hours. BNP (last 3 results) No results for input(s): "PROBNP" in the last 8760 hours. HbA1C: Recent Labs    12/18/23 2247  HGBA1C 5.0   CBG: No results for input(s): "GLUCAP" in the last 168 hours. Lipid Profile: Recent Labs    12/19/23 0422  CHOL 131  HDL 77  LDLCALC 45  TRIG 46  CHOLHDL 1.7   Thyroid  Function Tests: No results for input(s): "TSH", "T4TOTAL", "FREET4", "T3FREE", "THYROIDAB" in the last 72 hours. Anemia Panel: No results for input(s): "VITAMINB12", "FOLATE", "FERRITIN", "TIBC", "IRON", "RETICCTPCT" in the last 72 hours. Urine analysis:    Component Value Date/Time   COLORURINE STRAW (A) 12/18/2023 2000   APPEARANCEUR CLEAR 12/18/2023 2000   LABSPEC 1.008 12/18/2023 2000   PHURINE 6.0 12/18/2023 2000   GLUCOSEU NEGATIVE 12/18/2023 2000   HGBUR NEGATIVE 12/18/2023 2000   BILIRUBINUR NEGATIVE 12/18/2023 2000   KETONESUR 20 (A) 12/18/2023  2000   PROTEINUR NEGATIVE 12/18/2023 2000   NITRITE NEGATIVE 12/18/2023 2000   LEUKOCYTESUR TRACE (A) 12/18/2023 2000   Sepsis Labs: @LABRCNTIP (procalcitonin:4,lacticidven:4)  )No results found for this or any previous visit (from the past 240 hours).    Studies: CT HEAD WO CONTRAST ( ) Result Date: 12/20/2023 EXAM: CT HEAD WITHOUT 12/20/2023 11:59:13 AM TECHNIQUE: CT of the head was performed without the administration of intravenous contrast. Automated exposure control, iterative reconstruction, and/or weight based adjustment of the mA/kV was utilized to reduce the radiation dose to as low as reasonably achievable. COMPARISON: CT angiography of the head and neck and noncontrast head CT on 12/19/2023, and MR head without contrast on 12/18/2023. CLINICAL HISTORY: Head trauma, minor (Age >= 65y). Chief complaints; Altered Mental Status. FINDINGS: BRAIN AND VENTRICLES: There is no acute intracranial hemorrhage, mass effect or midline shift. No abnormal extra-axial fluid collection. The gray-white differentiation is maintained without evidence of an acute infarct. There is no evidence of hydrocephalus. Confluent periventricular and subcortical white matter disease is stable .  ORBITS: The visualized portion of the orbits demonstrate no acute abnormality. SINUSES: The visualized paranasal sinuses and mastoid air cells demonstrate no acute abnormality. SOFT TISSUES AND SKULL: No acute abnormality of the visualized skull or soft tissues. VASCULATURE: Atherosclerotic calcifications are present in the cavernous carotid arteries bilaterally. No hyperdense vessel is present. IMPRESSION: 1. No acute intracranial abnormality related to the minor head trauma and altered mental status. 2. Stable confluent periventricular and subcortical white matter disease. Electronically signed by: Audree Leas MD 12/20/2023 04:35 PM EDT RP Workstation: [id number]   DG FEMUR PORT, MIN 2 VIEWS RIGHT Result Date:  12/20/2023 CLINICAL DATA:  Fall.  Right hip pain. EXAM: RIGHT FEMUR PORTABLE 2 VIEW; DG HIP (WITH OR WITHOUT PELVIS) 2-3V RIGHT COMPARISON:  Pelvis and right hip radiographs 12/19/2023 FINDINGS: There is diffuse decreased bone mineralization. There is a new acute fracture of the mid right femoral neck with mild superior displacement of the distal fracture component and mild varus angulation. The bilateral femoral heads remain normally located. Mild bilateral superolateral acetabular degenerative osteophytosis. Mild bilateral sacroiliac subchondral sclerosis. Normal alignment at the knee. Mild medial compartment of the knee joint space narrowing. IMPRESSION: Acute mildly displaced and angulated fracture of the mid right femoral neck. This was not present on yesterday's radiographs. Electronically Signed   By: Bertina Broccoli M.D.   On: 12/20/2023 12:12   DG HIP UNILAT WITH PELVIS 2-3 VIEWS RIGHT Result Date: 12/20/2023 CLINICAL DATA:  Fall.  Right hip pain. EXAM: RIGHT FEMUR PORTABLE 2 VIEW; DG HIP (WITH OR WITHOUT PELVIS) 2-3V RIGHT COMPARISON:  Pelvis and right hip radiographs 12/19/2023 FINDINGS: There is diffuse decreased bone mineralization. There is a new acute fracture of the mid right femoral neck with mild superior displacement of the distal fracture component and mild varus angulation. The bilateral femoral heads remain normally located. Mild bilateral superolateral acetabular degenerative osteophytosis. Mild bilateral sacroiliac subchondral sclerosis. Normal alignment at the knee. Mild medial compartment of the knee joint space narrowing. IMPRESSION: Acute mildly displaced and angulated fracture of the mid right femoral neck. This was not present on yesterday's radiographs. Electronically Signed   By: Bertina Broccoli M.D.   On: 12/20/2023 12:12   DG HIP PORT UNILAT WITH PELVIS 1V RIGHT Result Date: 12/19/2023 CLINICAL DATA:  Right hip pain. EXAM: DG HIP (WITH OR WITHOUT PELVIS) 1V PORT RIGHT COMPARISON:   None Available. FINDINGS: Mildly decreased bone mineralization. Mild bilateral sacroiliac subchondral sclerosis. The bilateral femoroacetabular joint spaces are maintained. Mild superior pubic symphysis degenerative spurring. Moderate severe left L4-5 and moderate left L3-4 disc space narrowing. No acute fracture or dislocation. A vascular phlebolith overlies the left hemipelvis. IMPRESSION: 1. Mild bilateral sacroiliac osteoarthritis. 2. Moderate to severe left L4-5 and moderate left L3-4 degenerative disc and endplate changes. Electronically Signed   By: Bertina Broccoli M.D.   On: 12/19/2023 21:02    Scheduled Meds:  dorzolamide -timolol   1 drop Left Eye Q12H   latanoprost   1 drop Left Eye QHS   levothyroxine   75 mcg Oral Q0600   memantine   5 mg Oral QHS   multivitamin with minerals  1 tablet Oral QPC lunch   rosuvastatin   5 mg Oral QPC lunch    Continuous Infusions:     LOS: 0 days     Veronica Gordon, MD [facility]  If 7PM-7AM, please contact night-coverage [email or web address] 12/20/2023, 5:13 PM

## 2023-12-20 NOTE — Progress Notes (Signed)
Patient off the unit for CT scan.

## 2023-12-20 NOTE — Evaluation (Signed)
 SLP Cancellation Note  Patient Details Name: [name] MRN: [id number] DOB: [birth year]   Cancelled treatment:       Reason Eval/Treat Not Completed: Other (comment) (pt eating at this time, daughter present in room, will continue efforts)  Maudie Sorrow, MS [facility] SLP Acute Rehab Services Office [phone]  Chantal Comment 12/20/2023, 1:20 PM

## 2023-12-20 NOTE — TOC CAGE-AID Note (Signed)
 Transition of Care [facility]) - CAGE-AID Screening   Patient Details  Name: DELIAH STREHLOW MRN: [id number] Date of Birth: [birth year]  Transition of Care [facility]) CM/SW Contact:    Leota Maka E Hartleigh Edmonston, LCSW Phone Number: 12/20/2023, 11:02 AM   Clinical Narrative:    CAGE-AID Screening: Substance Abuse Screening unable to be completed due to: : Patient unable to participate (disoriented x 2)

## 2023-12-20 NOTE — Progress Notes (Addendum)
 STROKE TEAM PROGRESS NOTE    INTERIM HISTORY/SUBJECTIVE Daughter is at the bedside.  Patient is awake and alert in no apparent distress. No new neurological events overnight  Dr.Winona Sison spoke with daughter at the bedside and updated her, all questions and concerns were answered verbalized by the daughter  CBC    Component Value Date/Time   WBC 9.2 12/20/2023 0958   RBC 4.12 12/20/2023 0958   HGB 12.4 12/20/2023 0958   HGB 12.9 09/10/2023 1650   HCT 36.4 12/20/2023 0958   HCT 38.9 09/10/2023 1650   PLT 196 12/20/2023 0958   PLT 270 09/10/2023 1650   MCV 88.3 12/20/2023 0958   MCV 92 09/10/2023 1650   MCH 30.1 12/20/2023 0958   MCHC 34.1 12/20/2023 0958   RDW 13.1 12/20/2023 0958   RDW 12.0 09/10/2023 1650   LYMPHSABS 1.0 12/18/2023 1535   MONOABS 0.9 12/18/2023 1535   EOSABS 0.0 12/18/2023 1535   BASOSABS 0.0 12/18/2023 1535    BMET    Component Value Date/Time   NA 131 (L) 12/20/2023 0958   NA 140 09/10/2023 1650   K 3.5 12/20/2023 0958   CL 100 12/20/2023 0958   CO2 22 12/20/2023 0958   GLUCOSE 133 (H) 12/20/2023 0958   BUN 11 12/20/2023 0958   BUN 36 (H) 09/10/2023 1650   CREATININE 0.95 12/20/2023 0958   CALCIUM  9.1 12/20/2023 0958   EGFR 45 (L) 09/10/2023 1650   GFRNONAA 60 (L) 12/20/2023 0958    IMAGING past 24 hours DG HIP PORT UNILAT WITH PELVIS 1V RIGHT Result Date: 12/19/2023 CLINICAL DATA:  Right hip pain. EXAM: DG HIP (WITH OR WITHOUT PELVIS) 1V PORT RIGHT COMPARISON:  None Available. FINDINGS: Mildly decreased bone mineralization. Mild bilateral sacroiliac subchondral sclerosis. The bilateral femoroacetabular joint spaces are maintained. Mild superior pubic symphysis degenerative spurring. Moderate severe left L4-5 and moderate left L3-4 disc space narrowing. No acute fracture or dislocation. A vascular phlebolith overlies the left hemipelvis. IMPRESSION: 1. Mild bilateral sacroiliac osteoarthritis. 2. Moderate to severe left L4-5 and moderate left L3-4  degenerative disc and endplate changes. Electronically Signed   By: Bertina Broccoli M.D.   On: 12/19/2023 21:02    Vitals:   12/19/23 2131 12/20/23 0005 12/20/23 0521 12/20/23 0805  BP: (!) 142/73 (!) 154/68 (!) 168/80 (!) 148/83  Pulse: 71 84 83 89  Resp: 18 18 18 19   Temp: 97.9 F (36.6 C) 98.2 F (36.8 C) 98.6 F (37 C) 98.3 F (36.8 C)  TempSrc: Oral Axillary Axillary Oral  SpO2: 96% 94% 92% 93%  Weight:      Height:         PHYSICAL EXAM General:  Alert, well-nourished, well-developed patient in no acute distress Psych:  Mood and affect appropriate for situation CV: Regular rate and rhythm on monitor Respiratory:  Regular, unlabored respirations on room air GI: Abdomen soft and nontender   NEURO:  Mental Status: AA&O to self and place only, confused follows commands No aphasia or dysarthria but speech is tangential and flight of ideas Cranial Nerves:  II: PERRL. Visual fields full.  III, IV, VI: EOMI. Eyelids elevate symmetrically.  V: Sensation is intact to light touch and symmetrical to face.  VII: Face is symmetrical resting and smiling VIII: hearing intact to voice. IX, X: Palate elevates symmetrically. Phonation is normal.  ZO:[id number] shrug 5/5. XII: tongue is midline without fasciculations. Motor: 5/5 strength to all muscle groups tested.  Tone: is normal and bulk is normal Sensation- Intact to  light touch bilaterally. Extinction absent to light touch to DSS.   Coordination: FTN intact bilaterally, HKS: no ataxia in BLE.No drift.  Gait- deferred  Most Recent NIH 2  ASSESSMENT/PLAN  [name] is a 83 y.o. female with history of COPD, glaucoma, hypertension, hypothyroidism, dementia, aortic stenosis s/p TAVR admitted for confusion and was found to have an incidental punctate left frontal stroke.  NIH on Admission 2  Acute Ischemic Infarct:  left frontal  Etiology: Likely embolic CT head No acute abnormality.  CTA head & neck no LVO MRI  Punctate  acute ischemic nonhemorrhagic infarct involving the high left frontal lobe. 2D Echo EF 65 to 70%.  LV with grade 1 diastolic dysfunction Recommend 30-day heart monitor after discharge LDL 45 HgbA1c 5.0 VTE prophylaxis -Lovenox  aspirin  81 mg daily prior to admission, now on aspirin  81 mg daily and clopidogrel 75 mg daily for 3 weeks and then Plavix alone. Therapy recommendations:  Pending Disposition: Pending  Memory issues vascular dementia Follows with [facility] On home Namenda  5 mg continue  Hypertension Aortic stenosis s/p TAVR Home meds: Toprol XL 100 mg Stable Blood Pressure Goal: SBP less than 160   Hyperlipidemia Home meds: Crestor  5 mg,  resumed in hospital LDL 45, goal < 70 Continue statin at discharge  Dysphagia Patient has post-stroke dysphagia, SLP consulted    Diet   Diet Heart Room service appropriate? Yes; Fluid consistency: Thin   Advance diet as tolerated  Other Stroke Risk Factors Coronary artery disease   Other Active Problems Hypothyroidism Glaucoma COPD Hospital day # 0   Jonette Nestle DNP, ACNPC-AG  [facility]  I have personally obtained history,examined this patient, reviewed notes, independently viewed imaging studies, participated in medical decision making and plan of care.ROS completed by me personally and pertinent positives fully documented  I have made any additions or clarifications directly to the above note. Agree with note above.  Patient remains confused and disoriented and daughter states this is worse than her baseline dementia.  Daughter expresses concern that she may not be able to provide care for any other at home as she works.  Will ask case management to see if patient will qualify for rehab and short-term skilled nursing facility.  Stroke team will sign off.  Kindly call for questions greater than 50% time during the 30-minute visit was spent in counseling and coordination of care about her dementia and rehab needs and  discussion with patient, daughter and case manager and answering questions.  Ardella Beaver, MD Medical Director [facility] Pager: [phone] 12/20/2023 12:59 PM    To contact Stroke Continuity provider, please refer to [email or web address]. After hours, contact General Neurology

## 2023-12-20 NOTE — Plan of Care (Signed)
  Problem: Coping: Goal: Will verbalize positive feelings about self Outcome: Progressing Goal: Will identify appropriate support needs Outcome: Progressing   Problem: Health Behavior/Discharge Planning: Goal: Goals will be collaboratively established with patient/family Outcome: Progressing   Problem: Self-Care: Goal: Ability to communicate needs accurately will improve Outcome: Progressing   Problem: Nutrition: Goal: Risk of aspiration will decrease Outcome: Progressing Goal: Dietary intake will improve Outcome: Progressing   Problem: Clinical Measurements: Goal: Respiratory complications will improve Outcome: Progressing Goal: Cardiovascular complication will be avoided Outcome: Progressing   Problem: Nutrition: Goal: Adequate nutrition will be maintained Outcome: Progressing   Problem: Coping: Goal: Level of anxiety will decrease Outcome: Progressing

## 2023-12-20 NOTE — Progress Notes (Signed)
 Ordered 30 day cardiac event monitor, for stroke, to be read by Dr. Abel Hoe

## 2023-12-20 NOTE — TOC Initial Note (Signed)
 Transition of Care [facility]) - Initial/Assessment Note    Patient Details  Name: [name] MRN: [id number] Date of Birth: 02/26/1941  Transition of Care [facility]) CM/SW Contact:    Jonathan Neighbor, RN Phone Number: 12/20/2023, 12:57 PM  Clinical Narrative:                  Pt is from home with her daughter. Daughter is very concerned about her being able to manage the patient at home without some rehab prior to returning home. She and pts son (POA) would prefer pt have SNF rehab before home. CM has updated PT to see if she will qualify.  Pt has LTC policy per son that they will use to either have LTC after rehab or caregivers in the home after rehab. CM has encouraged the son to talk to the LTC policy admin and see what he needs for this to get started.  Awaiting therapy evals.  TOC following.  Expected Discharge Plan: Skilled Nursing Facility Barriers to Discharge: Continued Medical Work up   Patient Goals and CMS Choice   CMS Medicare.gov Compare Post Acute Care list provided to:: Patient Represenative (must comment) Choice offered to / list presented to : Adult Children      Expected Discharge Plan and Services In-house Referral: Clinical Social Work Discharge Planning Services: CM Consult Post Acute Care Choice: Skilled Nursing Facility Living arrangements for the past 2 months: Single Family Home                                      Prior Living Arrangements/Services Living arrangements for the past 2 months: Single Family Home Lives with:: Adult Children Patient language and need for interpreter reviewed:: Yes Do you feel safe going back to the place where you live?: Yes        Care giver support system in place?: Yes (comment)   Criminal Activity/Legal Involvement Pertinent to Current Situation/Hospitalization: No - Comment as needed  Activities of Daily Living   ADL Screening (condition at time of admission) Independently performs ADLs?: Yes (appropriate  for developmental age) Is the patient deaf or have difficulty hearing?: No Does the patient have difficulty seeing, even when wearing glasses/contacts?: No Does the patient have difficulty concentrating, remembering, or making decisions?: Yes  Permission Sought/Granted                  Emotional Assessment Appearance:: Appears stated age         Psych Involvement: No (comment)  Admission diagnosis:  Acute CVA (cerebrovascular accident) (HCC) [I63.9] Altered mental status, unspecified altered mental status type [R41.82] Cerebrovascular accident (CVA), unspecified mechanism (HCC) [I63.9] Patient Active Problem List   Diagnosis Date Noted   Acute CVA (cerebrovascular accident) (HCC) 12/18/2023   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   Hypothyroid    Hypertension    Severe aortic stenosis 11/08/2023   MCI (mild cognitive impairment) 05/25/2020   COVID-19 virus infection 05/25/2020   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   Pseudophakia 11/01/2012   COPD (chronic obstructive pulmonary disease) (HCC) 07/05/2012   Multiple thyroid  nodules 01/01/2012   PCP:  Imelda Man, MD Pharmacy:   [id number] Jon[place] Nestle, [place] - [address] [place] [address] Phone: [phone] Fax: [phone]     Social Drivers  of Health (SDOH) Social History: SDOH Screenings   Food Insecurity: No Food Insecurity (12/18/2023)  Housing: Low Risk  (12/18/2023)  Transportation Needs: No Transportation Needs (12/18/2023)  Utilities: Not At Risk (12/18/2023)  Social Connections: Socially Isolated (12/18/2023)  Tobacco Use: Medium Risk (12/18/2023)   SDOH Interventions:     Readmission Risk Interventions    11/21/2023   10:23 AM  Readmission Risk Prevention Plan  Post Dischage Appt Complete  Medication Screening Complete   Transportation Screening Complete

## 2023-12-21 ENCOUNTER — Other Ambulatory Visit: Payer: Self-pay

## 2023-12-21 ENCOUNTER — Inpatient Hospital Stay (HOSPITAL_COMMUNITY)

## 2023-12-21 ENCOUNTER — Inpatient Hospital Stay (HOSPITAL_COMMUNITY): Admitting: Anesthesiology

## 2023-12-21 ENCOUNTER — Encounter (HOSPITAL_COMMUNITY): Payer: Self-pay | Admitting: Internal Medicine

## 2023-12-21 ENCOUNTER — Encounter (HOSPITAL_COMMUNITY)
Admission: EM | Disposition: A | Discharge status: Rehabilitation Facility | Payer: Self-pay | Source: Home / Self Care | Attending: Internal Medicine

## 2023-12-21 DIAGNOSIS — K529 Noninfective gastroenteritis and colitis, unspecified: Secondary | ICD-10-CM | POA: Diagnosis not present

## 2023-12-21 DIAGNOSIS — R933 Abnormal findings on diagnostic imaging of other parts of digestive tract: Secondary | ICD-10-CM | POA: Diagnosis not present

## 2023-12-21 DIAGNOSIS — Y92239 Unspecified place in hospital as the place of occurrence of the external cause: Secondary | ICD-10-CM | POA: Diagnosis present

## 2023-12-21 DIAGNOSIS — Z953 Presence of xenogenic heart valve: Secondary | ICD-10-CM | POA: Diagnosis not present

## 2023-12-21 DIAGNOSIS — K625 Hemorrhage of anus and rectum: Secondary | ICD-10-CM | POA: Diagnosis not present

## 2023-12-21 DIAGNOSIS — J449 Chronic obstructive pulmonary disease, unspecified: Secondary | ICD-10-CM | POA: Diagnosis present

## 2023-12-21 DIAGNOSIS — Z79899 Other long term (current) drug therapy: Secondary | ICD-10-CM | POA: Diagnosis not present

## 2023-12-21 DIAGNOSIS — W06XXXA Fall from bed, initial encounter: Secondary | ICD-10-CM | POA: Diagnosis present

## 2023-12-21 DIAGNOSIS — Z87891 Personal history of nicotine dependence: Secondary | ICD-10-CM | POA: Diagnosis not present

## 2023-12-21 DIAGNOSIS — I639 Cerebral infarction, unspecified: Secondary | ICD-10-CM

## 2023-12-21 DIAGNOSIS — E785 Hyperlipidemia, unspecified: Secondary | ICD-10-CM | POA: Diagnosis present

## 2023-12-21 DIAGNOSIS — Z01810 Encounter for preprocedural cardiovascular examination: Secondary | ICD-10-CM | POA: Diagnosis not present

## 2023-12-21 DIAGNOSIS — I2510 Atherosclerotic heart disease of native coronary artery without angina pectoris: Secondary | ICD-10-CM | POA: Diagnosis present

## 2023-12-21 DIAGNOSIS — S72011A Unspecified intracapsular fracture of right femur, initial encounter for closed fracture: Secondary | ICD-10-CM | POA: Diagnosis present

## 2023-12-21 DIAGNOSIS — H409 Unspecified glaucoma: Secondary | ICD-10-CM | POA: Diagnosis present

## 2023-12-21 DIAGNOSIS — R531 Weakness: Secondary | ICD-10-CM | POA: Diagnosis not present

## 2023-12-21 DIAGNOSIS — R000 Tachycardia, unspecified: Secondary | ICD-10-CM | POA: Diagnosis not present

## 2023-12-21 DIAGNOSIS — E039 Hypothyroidism, unspecified: Secondary | ICD-10-CM | POA: Diagnosis present

## 2023-12-21 DIAGNOSIS — R29702 NIHSS score 2: Secondary | ICD-10-CM | POA: Diagnosis present

## 2023-12-21 DIAGNOSIS — Z79890 Hormone replacement therapy (postmenopausal): Secondary | ICD-10-CM | POA: Diagnosis not present

## 2023-12-21 DIAGNOSIS — I10 Essential (primary) hypertension: Secondary | ICD-10-CM

## 2023-12-21 DIAGNOSIS — G309 Alzheimer's disease, unspecified: Secondary | ICD-10-CM | POA: Diagnosis present

## 2023-12-21 DIAGNOSIS — E222 Syndrome of inappropriate secretion of antidiuretic hormone: Secondary | ICD-10-CM | POA: Diagnosis present

## 2023-12-21 DIAGNOSIS — K5900 Constipation, unspecified: Secondary | ICD-10-CM | POA: Diagnosis not present

## 2023-12-21 DIAGNOSIS — I6389 Other cerebral infarction: Secondary | ICD-10-CM | POA: Diagnosis present

## 2023-12-21 DIAGNOSIS — K5289 Other specified noninfective gastroenteritis and colitis: Secondary | ICD-10-CM | POA: Diagnosis not present

## 2023-12-21 DIAGNOSIS — R1310 Dysphagia, unspecified: Secondary | ICD-10-CM | POA: Diagnosis present

## 2023-12-21 DIAGNOSIS — Z952 Presence of prosthetic heart valve: Secondary | ICD-10-CM | POA: Diagnosis not present

## 2023-12-21 DIAGNOSIS — F02A0 Dementia in other diseases classified elsewhere, mild, without behavioral disturbance, psychotic disturbance, mood disturbance, and anxiety: Secondary | ICD-10-CM | POA: Diagnosis present

## 2023-12-21 DIAGNOSIS — R262 Difficulty in walking, not elsewhere classified: Secondary | ICD-10-CM | POA: Diagnosis present

## 2023-12-21 DIAGNOSIS — F01A0 Vascular dementia, mild, without behavioral disturbance, psychotic disturbance, mood disturbance, and anxiety: Secondary | ICD-10-CM | POA: Diagnosis present

## 2023-12-21 DIAGNOSIS — D62 Acute posthemorrhagic anemia: Secondary | ICD-10-CM | POA: Diagnosis not present

## 2023-12-21 DIAGNOSIS — S72001A Fracture of unspecified part of neck of right femur, initial encounter for closed fracture: Secondary | ICD-10-CM

## 2023-12-21 DIAGNOSIS — I69398 Other sequelae of cerebral infarction: Secondary | ICD-10-CM | POA: Diagnosis not present

## 2023-12-21 DIAGNOSIS — S72001G Fracture of unspecified part of neck of right femur, subsequent encounter for closed fracture with delayed healing: Secondary | ICD-10-CM | POA: Diagnosis not present

## 2023-12-21 DIAGNOSIS — R911 Solitary pulmonary nodule: Secondary | ICD-10-CM | POA: Diagnosis present

## 2023-12-21 HISTORY — PX: TOTAL HIP ARTHROPLASTY: SHX124

## 2023-12-21 LAB — CBC WITH DIFFERENTIAL/PLATELET
Abs Immature Granulocytes: 0.05 10*3/uL (ref 0.00–0.07)
Basophils Absolute: 0 10*3/uL (ref 0.0–0.1)
Basophils Relative: 0 %
Eosinophils Absolute: 0 10*3/uL (ref 0.0–0.5)
Eosinophils Relative: 0 %
HCT: 41.6 % (ref 36.0–46.0)
Hemoglobin: 14.2 g/dL (ref 12.0–15.0)
Immature Granulocytes: 1 %
Lymphocytes Relative: 8 %
Lymphs Abs: 0.8 10*3/uL (ref 0.7–4.0)
MCH: 30.5 pg (ref 26.0–34.0)
MCHC: 34.1 g/dL (ref 30.0–36.0)
MCV: 89.5 fL (ref 80.0–100.0)
Monocytes Absolute: 1 10*3/uL (ref 0.1–1.0)
Monocytes Relative: 9 %
Neutro Abs: 8.9 10*3/uL — ABNORMAL HIGH (ref 1.7–7.7)
Neutrophils Relative %: 82 %
Platelets: 201 10*3/uL (ref 150–400)
RBC: 4.65 MIL/uL (ref 3.87–5.11)
RDW: 13.3 % (ref 11.5–15.5)
WBC: 10.7 10*3/uL — ABNORMAL HIGH (ref 4.0–10.5)
nRBC: 0 % (ref 0.0–0.2)

## 2023-12-21 LAB — BASIC METABOLIC PANEL WITH GFR
Anion gap: 14 (ref 5–15)
BUN: 16 mg/dL (ref 8–23)
CO2: 19 mmol/L — ABNORMAL LOW (ref 22–32)
Calcium: 9.2 mg/dL (ref 8.9–10.3)
Chloride: 96 mmol/L — ABNORMAL LOW (ref 98–111)
Creatinine, Ser: 0.93 mg/dL (ref 0.44–1.00)
GFR, Estimated: >60 mL/min (ref >60)
Glucose, Bld: 112 mg/dL — ABNORMAL HIGH (ref 70–99)
Potassium: 3.9 mmol/L (ref 3.5–5.1)
Sodium: 129 mmol/L — ABNORMAL LOW (ref 135–145)

## 2023-12-21 LAB — TYPE AND SCREEN
ABO/RH(D): O POS
Antibody Screen: NEGATIVE

## 2023-12-21 LAB — SURGICAL PCR SCREEN
MRSA, PCR: NEGATIVE
Staphylococcus aureus: NEGATIVE

## 2023-12-21 SURGERY — ARTHROPLASTY, HIP, TOTAL, ANTERIOR APPROACH
Anesthesia: General | Site: Hip | Laterality: Right

## 2023-12-21 MED ORDER — ASPIRIN 325 MG PO TBEC: 325.0000 mg | DELAYED_RELEASE_TABLET | Freq: Every day | ORAL | Status: DC

## 2023-12-21 MED ORDER — FENTANYL CITRATE (PF) 250 MCG/5ML IJ SOLN
INTRAMUSCULAR | Status: DC | PRN
  Administered 2023-12-21: 100 ug via INTRAVENOUS

## 2023-12-21 MED ORDER — MORPHINE SULFATE (PF) 2 MG/ML IV SOLN: 0.5000 mg | INTRAVENOUS | Status: DC | PRN

## 2023-12-21 MED ORDER — CHLORHEXIDINE GLUCONATE 4 % EX SOLN: 60.0000 mL | Freq: Once | CUTANEOUS | Status: DC

## 2023-12-21 MED ORDER — CEFAZOLIN SODIUM-DEXTROSE 2-4 GM/100ML-% IV SOLN
2.0000 g | Freq: Four times a day (QID) | INTRAVENOUS | Status: AC
  Administered 2023-12-21 – 2023-12-22 (×2): 2 g via INTRAVENOUS
  Filled 2023-12-21 (×2): qty 100

## 2023-12-21 MED ORDER — ONDANSETRON HCL 4 MG/2ML IJ SOLN
INTRAMUSCULAR | Status: DC | PRN
  Administered 2023-12-21: 4 mg via INTRAVENOUS

## 2023-12-21 MED ORDER — CARMEX CLASSIC LIP BALM EX OINT
TOPICAL_OINTMENT | CUTANEOUS | Status: DC | PRN
  Filled 2023-12-21: qty 10

## 2023-12-21 MED ORDER — CHLORHEXIDINE GLUCONATE 0.12 % MT SOLN: 15.0000 mL | Freq: Once | OROMUCOSAL | Status: DC

## 2023-12-21 MED ORDER — ORAL CARE MOUTH RINSE: 15.0000 mL | Freq: Once | OROMUCOSAL | Status: DC

## 2023-12-21 MED ORDER — PHENOL 1.4 % MT LIQD: 1.0000 | OROMUCOSAL | Status: DC | PRN

## 2023-12-21 MED ORDER — SODIUM CHLORIDE 0.9% FLUSH
3.0000 mL | Freq: Two times a day (BID) | INTRAVENOUS | Status: DC
  Administered 2023-12-21: 3 mL via INTRAVENOUS
  Administered 2023-12-22: 10 mL via INTRAVENOUS
  Administered 2023-12-22: 3 mL via INTRAVENOUS
  Administered 2023-12-23: 10 mL via INTRAVENOUS
  Administered 2023-12-23 – 2023-12-24 (×2): 3 mL via INTRAVENOUS
  Administered 2023-12-24 – 2023-12-27 (×6): 10 mL via INTRAVENOUS

## 2023-12-21 MED ORDER — POVIDONE-IODINE 10 % EX SWAB: 2.0000 | Freq: Once | CUTANEOUS | Status: DC

## 2023-12-21 MED ORDER — TRANEXAMIC ACID-NACL 1000-0.7 MG/100ML-% IV SOLN
1000.0000 mg | INTRAVENOUS | Status: AC
  Administered 2023-12-21: 1000 mg via INTRAVENOUS

## 2023-12-21 MED ORDER — VANCOMYCIN HCL 1000 MG IV SOLR
INTRAVENOUS | Status: AC
  Filled 2023-12-21: qty 20

## 2023-12-21 MED ORDER — SODIUM CHLORIDE 0.9% FLUSH: 3.0000 mL | INTRAVENOUS | Status: DC | PRN

## 2023-12-21 MED ORDER — PROPOFOL 10 MG/ML IV BOLUS
INTRAVENOUS | Status: AC
  Filled 2023-12-21: qty 20

## 2023-12-21 MED ORDER — LIDOCAINE 2% (20 MG/ML) 5 ML SYRINGE
INTRAMUSCULAR | Status: DC | PRN
  Administered 2023-12-21: 60 mg via INTRAVENOUS

## 2023-12-21 MED ORDER — OXYCODONE HCL 5 MG PO TABS: 5.0000 mg | ORAL_TABLET | Freq: Once | ORAL | Status: DC | PRN

## 2023-12-21 MED ORDER — DEXAMETHASONE SODIUM PHOSPHATE 10 MG/ML IJ SOLN
INTRAMUSCULAR | Status: AC
  Filled 2023-12-21: qty 1

## 2023-12-21 MED ORDER — CEFAZOLIN SODIUM-DEXTROSE 2-4 GM/100ML-% IV SOLN
INTRAVENOUS | Status: AC
Start: 2023-12-21 — End: 2023-12-21
  Filled 2023-12-21: qty 100

## 2023-12-21 MED ORDER — LACTATED RINGERS IV SOLN: INTRAVENOUS | Status: DC

## 2023-12-21 MED ORDER — SUGAMMADEX SODIUM 200 MG/2ML IV SOLN
INTRAVENOUS | Status: DC | PRN
  Administered 2023-12-21: 200 mg via INTRAVENOUS

## 2023-12-21 MED ORDER — ROCURONIUM BROMIDE 10 MG/ML (PF) SYRINGE
PREFILLED_SYRINGE | INTRAVENOUS | Status: DC | PRN
  Administered 2023-12-21: 50 mg via INTRAVENOUS

## 2023-12-21 MED ORDER — HYDROCODONE-ACETAMINOPHEN 7.5-325 MG PO TABS: 1.0000 | ORAL_TABLET | ORAL | Status: DC | PRN

## 2023-12-21 MED ORDER — PHENYLEPHRINE 80 MCG/ML (10ML) SYRINGE FOR IV PUSH (FOR BLOOD PRESSURE SUPPORT)
PREFILLED_SYRINGE | INTRAVENOUS | Status: AC
  Filled 2023-12-21: qty 10

## 2023-12-21 MED ORDER — 0.9 % SODIUM CHLORIDE (POUR BTL) OPTIME
TOPICAL | Status: DC | PRN
  Administered 2023-12-21: 1000 mL

## 2023-12-21 MED ORDER — EPHEDRINE 5 MG/ML INJ
INTRAVENOUS | Status: AC
  Filled 2023-12-21: qty 5

## 2023-12-21 MED ORDER — VANCOMYCIN HCL 1 G IV SOLR
INTRAVENOUS | Status: DC | PRN
  Administered 2023-12-21: 1000 mg

## 2023-12-21 MED ORDER — FENTANYL CITRATE (PF) 250 MCG/5ML IJ SOLN
INTRAMUSCULAR | Status: AC
  Filled 2023-12-21: qty 5

## 2023-12-21 MED ORDER — DEXAMETHASONE SODIUM PHOSPHATE 10 MG/ML IJ SOLN
INTRAMUSCULAR | Status: DC | PRN
  Administered 2023-12-21: 10 mg via INTRAVENOUS

## 2023-12-21 MED ORDER — HYDROMORPHONE HCL 1 MG/ML IJ SOLN
1.0000 mg | Freq: Once | INTRAMUSCULAR | Status: AC
  Administered 2023-12-21: 1 mg via INTRAVENOUS
  Filled 2023-12-21: qty 1

## 2023-12-21 MED ORDER — ACETAMINOPHEN 325 MG PO TABS: 325.0000 mg | ORAL_TABLET | Freq: Four times a day (QID) | ORAL | Status: DC | PRN

## 2023-12-21 MED ORDER — HYDROCODONE-ACETAMINOPHEN 5-325 MG PO TABS
1.0000 | ORAL_TABLET | Freq: Four times a day (QID) | ORAL | Status: DC | PRN
Start: 2023-12-21 — End: 2023-12-21

## 2023-12-21 MED ORDER — TRANEXAMIC ACID-NACL 1000-0.7 MG/100ML-% IV SOLN
INTRAVENOUS | Status: AC
Start: 2023-12-21 — End: 2023-12-21
  Filled 2023-12-21: qty 100

## 2023-12-21 MED ORDER — MUPIROCIN 2 % EX OINT
1.0000 | TOPICAL_OINTMENT | Freq: Two times a day (BID) | CUTANEOUS | Status: AC
Start: 2023-12-21 — End: 2023-12-26
  Administered 2023-12-21 – 2023-12-25 (×10): 1 via NASAL
  Filled 2023-12-21 (×2): qty 22

## 2023-12-21 MED ORDER — HYDROCODONE-ACETAMINOPHEN 5-325 MG PO TABS: 1.0000 | ORAL_TABLET | ORAL | Status: DC | PRN

## 2023-12-21 MED ORDER — CEFAZOLIN SODIUM-DEXTROSE 2-4 GM/100ML-% IV SOLN
2.0000 g | INTRAVENOUS | Status: AC
  Administered 2023-12-21: 2 g via INTRAVENOUS

## 2023-12-21 MED ORDER — ALBUMIN HUMAN 5 % IV SOLN: INTRAVENOUS | Status: DC | PRN

## 2023-12-21 MED ORDER — SODIUM CHLORIDE 0.9 % IR SOLN
Status: DC | PRN
  Administered 2023-12-21: 1000 mL

## 2023-12-21 MED ORDER — ONDANSETRON HCL 4 MG/2ML IJ SOLN: 4.0000 mg | Freq: Once | INTRAMUSCULAR | Status: DC | PRN

## 2023-12-21 MED ORDER — ONDANSETRON HCL 4 MG/2ML IJ SOLN
INTRAMUSCULAR | Status: AC
  Filled 2023-12-21: qty 2

## 2023-12-21 MED ORDER — FENTANYL CITRATE (PF) 100 MCG/2ML IJ SOLN: 25.0000 ug | INTRAMUSCULAR | Status: DC | PRN

## 2023-12-21 MED ORDER — LIDOCAINE 2% (20 MG/ML) 5 ML SYRINGE
INTRAMUSCULAR | Status: AC
  Filled 2023-12-21: qty 5

## 2023-12-21 MED ORDER — TRANEXAMIC ACID-NACL 1000-0.7 MG/100ML-% IV SOLN
1000.0000 mg | Freq: Once | INTRAVENOUS | Status: AC
  Administered 2023-12-21: 1000 mg via INTRAVENOUS
  Filled 2023-12-21: qty 100

## 2023-12-21 MED ORDER — ROCURONIUM BROMIDE 10 MG/ML (PF) SYRINGE
PREFILLED_SYRINGE | INTRAVENOUS | Status: AC
  Filled 2023-12-21: qty 10

## 2023-12-21 MED ORDER — HYDRALAZINE HCL 20 MG/ML IJ SOLN
10.0000 mg | Freq: Four times a day (QID) | INTRAMUSCULAR | Status: DC | PRN
  Administered 2023-12-21: 10 mg via INTRAVENOUS
  Filled 2023-12-21: qty 1

## 2023-12-21 MED ORDER — MEPERIDINE HCL 25 MG/ML IJ SOLN: 6.2500 mg | INTRAMUSCULAR | Status: DC | PRN

## 2023-12-21 MED ORDER — MENTHOL 3 MG MT LOZG: 1.0000 | LOZENGE | OROMUCOSAL | Status: DC | PRN

## 2023-12-21 MED ORDER — LIDOCAINE 2% (20 MG/ML) 5 ML SYRINGE
INTRAMUSCULAR | Status: AC
Start: 2023-12-21 — End: ?
  Filled 2023-12-21: qty 5

## 2023-12-21 MED ORDER — PHENYLEPHRINE HCL-NACL 20-0.9 MG/250ML-% IV SOLN
INTRAVENOUS | Status: DC | PRN
  Administered 2023-12-21 (×3): 80 ug via INTRAVENOUS

## 2023-12-21 MED ORDER — PROPOFOL 10 MG/ML IV BOLUS
INTRAVENOUS | Status: DC | PRN
  Administered 2023-12-21: 50 mg via INTRAVENOUS

## 2023-12-21 MED ORDER — OXYCODONE HCL 5 MG/5ML PO SOLN: 5.0000 mg | Freq: Once | ORAL | Status: DC | PRN

## 2023-12-21 SURGICAL SUPPLY — 54 items
BAG COUNTER SPONGE SURGICOUNT (BAG) ×1 IMPLANT
BAG DECANTER FOR FLEXI CONT (MISCELLANEOUS) ×1 IMPLANT
BLADE SAG 18X100X1.27 (BLADE) ×1 IMPLANT
COVER PERINEAL POST (MISCELLANEOUS) ×1 IMPLANT
COVER SURGICAL LIGHT HANDLE (MISCELLANEOUS) ×1 IMPLANT
DERMABOND ADVANCED .7 DNX12 (GAUZE/BANDAGES/DRESSINGS) IMPLANT
DRAPE C-ARM 42X72 X-RAY (DRAPES) ×1 IMPLANT
DRAPE POUCH INSTRU U-SHP 10X18 (DRAPES) ×1 IMPLANT
DRAPE STERI IOBAN 125X83 (DRAPES) ×1 IMPLANT
DRAPE U-SHAPE 47X51 STRL (DRAPES) ×2 IMPLANT
DRESSING AQUACEL AG SP 3.5X10 (GAUZE/BANDAGES/DRESSINGS) IMPLANT
DRSG AQUACEL AG ADV 3.5X10 (GAUZE/BANDAGES/DRESSINGS) ×1 IMPLANT
DURAPREP 26ML APPLICATOR (WOUND CARE) ×2 IMPLANT
ELECTRODE BLDE 4.0 EZ CLN MEGD (MISCELLANEOUS) ×1 IMPLANT
ELECTRODE REM PT RTRN 9FT ADLT (ELECTROSURGICAL) ×1 IMPLANT
GLOVE BIOGEL PI IND STRL 7.0 (GLOVE) ×2 IMPLANT
GLOVE BIOGEL PI IND STRL 7.5 (GLOVE) ×5 IMPLANT
GLOVE ECLIPSE 7.0 STRL STRAW (GLOVE) ×2 IMPLANT
GLOVE SKINSENSE STRL SZ7.5 (GLOVE) ×1 IMPLANT
GLOVE SURG SYN 7.5 E (GLOVE) ×2 IMPLANT
GLOVE SURG SYN 7.5 PF PI (GLOVE) ×2 IMPLANT
GLOVE SURG UNDER POLY LF SZ7 (GLOVE) ×3 IMPLANT
GLOVE SURG UNDER POLY LF SZ7.5 (GLOVE) ×2 IMPLANT
GOWN STRL REUS W/ TWL LRG LVL3 (GOWN DISPOSABLE) IMPLANT
GOWN STRL REUS W/ TWL XL LVL3 (GOWN DISPOSABLE) ×1 IMPLANT
GOWN STRL SURGICAL XL XLNG (GOWN DISPOSABLE) ×1 IMPLANT
GOWN TOGA ZIPPER T7+ PEEL AWAY (MISCELLANEOUS) ×1 IMPLANT
HEAD FEM -6XOFST 36XMDLR (Orthopedic Implant) IMPLANT
HOOD PEEL AWAY T7 (MISCELLANEOUS) ×1 IMPLANT
IV NS IRRIG 3000ML ARTHROMATIC (IV SOLUTION) ×1 IMPLANT
KIT BASIN OR (CUSTOM PROCEDURE TRAY) ×1 IMPLANT
LINER ACETAB NN G7 F 36 (Liner) IMPLANT
MARKER SKIN DUAL TIP RULER LAB (MISCELLANEOUS) ×1 IMPLANT
NDL SPNL 18GX3.5 QUINCKE PK (NEEDLE) ×1 IMPLANT
NEEDLE SPNL 18GX3.5 QUINCKE PK (NEEDLE) IMPLANT
PACK TOTAL JOINT (CUSTOM PROCEDURE TRAY) ×1 IMPLANT
PACK UNIVERSAL I (CUSTOM PROCEDURE TRAY) ×1 IMPLANT
SET HNDPC FAN SPRY TIP SCT (DISPOSABLE) ×1 IMPLANT
SHELL ACETAB 54 4H HIP (Shell) IMPLANT
SOLUTION PRONTOSAN WOUND 350ML (IRRIGATION / IRRIGATOR) ×1 IMPLANT
STEM FEM CMTLS TL 12X144 123D (Stem) IMPLANT
SUT ETHIBOND 2 V 37 (SUTURE) ×1 IMPLANT
SUT ETHIBOND NAB CT1 #1 30IN (SUTURE) IMPLANT
SUT MNCRL AB 3-0 PS2 18 (SUTURE) IMPLANT
SUT MON AB 2-0 CT1 36 (SUTURE) IMPLANT
SUT VIC AB 0 CT1 27XBRD ANBCTR (SUTURE) ×1 IMPLANT
SUT VIC AB 1 CTX36XBRD ANBCTR (SUTURE) ×1 IMPLANT
SUT VIC AB 2-0 CT1 TAPERPNT 27 (SUTURE) ×2 IMPLANT
SYR 50ML LL SCALE MARK (SYRINGE) ×1 IMPLANT
TOWEL GREEN STERILE (TOWEL DISPOSABLE) ×1 IMPLANT
TRAY CATH INTERMITTENT SS 16FR (CATHETERS) IMPLANT
TRAY FOLEY W/BAG SLVR 16FR ST (SET/KITS/TRAYS/PACK) IMPLANT
TUBE SUCT ARGYLE STRL (TUBING) ×1 IMPLANT
YANKAUER SUCT BULB TIP NO VENT (SUCTIONS) ×1 IMPLANT

## 2023-12-21 NOTE — Anesthesia Procedure Notes (Signed)
 Procedure Name: Intubation Date/Time: 12/21/2023 12:26 PM  Performed by: [address] Tyheem Boughner A, CRNAPre-anesthesia Checklist: Patient identified, Emergency Drugs available, Suction available, Patient being monitored and Timeout performed Patient Re-evaluated:Patient Re-evaluated prior to induction Oxygen Delivery Method: Circle system utilized Preoxygenation: Pre-oxygenation with 100% oxygen Induction Type: IV induction Laryngoscope Size: Mac and 4 Grade View: Grade I Tube type: Oral Tube size: 7.0 mm Placement Confirmation: ETT inserted through vocal cords under direct vision, positive ETCO2, CO2 detector and breath sounds checked- equal and bilateral Secured at: 21 cm Tube secured with: Tape

## 2023-12-21 NOTE — Evaluation (Signed)
 SLP Cancellation Note  Patient Details Name: [name] MRN: [id number] DOB: 1941-06-29   Cancelled treatment:       Reason Eval/Treat Not Completed: Other (comment) (pt having surgery today per chart review, will follow up for cog ling eval)   Chantal Comment 12/21/2023, 7:14 AM

## 2023-12-21 NOTE — H&P (View-Only) (Signed)
 Reason for Consult:Right hip fx Referring Physician: Eppie Hasting Time called: 0730 Time at bedside: 0936   [name] is an 83 y.o. female.  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 She was then found on the floor the night before last. X-rays showed a right hip fx and orthopedic surgery was consulted. Prior to the CVA she was living at home with her daughter and was not using any assistive devices to ambulate.  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 Abel Hoe & Dr. Honey Lusty   Scoliosis    Severe aortic stenosis    Wears glasses     Past Surgical History:  Procedure Laterality Date   CESAREAN SECTION  07/31/1976   TWINS, ONE WAS STILLBORN   INTRAOPERATIVE TRANSTHORACIC ECHOCARDIOGRAM N/A 11/20/2023   Procedure: ECHOCARDIOGRAM, TRANSTHORACIC;  Surgeon: Odie Benne, MD;  Location: [facility];  Service: Cardiovascular;  Laterality: N/A;   MOUTH SURGERY  07/31/1994   tongue resection   NODULE REMOVED     BENIGN, LOWER NECK   RIGHT HEART CATH AND CORONARY ANGIOGRAPHY N/A 09/21/2023   Procedure: RIGHT HEART CATH AND CORONARY ANGIOGRAPHY;  Surgeon: Odie Benne, MD;  Location: [facility];  Service: Cardiovascular;  Laterality: N/A;    Family History  Problem Relation Age of Onset   Thyroid  disease Mother    Dementia Mother    Thyroid  disease Sister     Social History:  reports that she quit smoking about 29 years ago. Her smoking use included cigarettes. She started smoking about 31 years ago. She has a 0.4 pack-year smoking history. She has never used smokeless tobacco. She reports that she  does not drink alcohol and does not use drugs.  Allergies:  Allergies  Allergen Reactions   Brimonidine Itching    Red eyes   Fluocinolone Acetonide Other (See Comments)    Unable to recall   Naphazoline-Polyethyl Glycol     THAT YOU USE TO DILATE YOUR EYES.Aaron AasMAKES BP GO UP, CAN ONLY USE HALF STRENGTH   Triamcinolone Acetonide Other (See Comments)    Unable to recall   Tropicamide Other (See Comments)    Elevates IOP OK to use 0.5%    Medications: I have reviewed the patient's current medications.  Results for orders placed or performed during the hospital encounter of 12/18/23 (from the past 48 hours)  CBC     Status: None   Collection Time: 12/20/23  9:58 AM  Result Value Ref Range   WBC 9.2 4.0 - 10.5 K/uL   RBC 4.12 3.87 - 5.11 MIL/uL   Hemoglobin 12.4 12.0 - 15.0 g/dL   HCT 16.1 09.6 - 04.5 %   MCV 88.3 80.0 - 100.0 fL   MCH 30.1 26.0 - 34.0 pg   MCHC 34.1 30.0 - 36.0 g/dL   RDW 40.9 81.1 - 91.4 %   Platelets 196 150 - 400 K/uL   nRBC 0.0 0.0 - 0.2 %    Comment: Performed at [facility], [address] [place], [place] [address]  Basic metabolic panel     Status: Abnormal   Collection Time: 12/20/23  9:58 AM  Result Value Ref Range   Sodium 131 (L) 135 - 145 mmol/L   Potassium 3.5 3.5 - 5.1 mmol/L   Chloride 100 98 - 111 mmol/L   CO2 22 22 - 32 mmol/L   Glucose, Bld 133 (H) 70 - 99 mg/dL    Comment: Glucose reference range applies only to samples taken after fasting for at least 8 hours.   BUN 11 8 - 23 mg/dL   Creatinine, Ser 4.78 0.44 - 1.00 mg/dL   Calcium  9.1 8.9 - 10.3 mg/dL   GFR, Estimated 60 (L) >60 mL/min    Comment: (NOTE) Calculated using the CKD-EPI Creatinine Equation (2021)    Anion gap 9 5 - 15    Comment: Performed at [facility], [address], [place], [place] [address]  Surgical PCR screen     Status: None   Collection Time: 12/21/23  2:00 AM   Specimen: Nasal Mucosa; Nasal Swab  Result Value Ref Range   MRSA, PCR  NEGATIVE NEGATIVE   Staphylococcus aureus NEGATIVE NEGATIVE    Comment: (NOTE) The Xpert SA Assay (FDA approved for NASAL specimens in patients 55 years of age and older), is one component of a comprehensive surveillance program. It is not intended to diagnose infection nor to guide or monitor treatment. Performed at [facility], [address] [place], [place] [address]   CBC with Differential/Platelet     Status: Abnormal   Collection Time: 12/21/23  6:51 AM  Result Value Ref Range   WBC 10.7 (H) 4.0 - 10.5 K/uL   RBC 4.65 3.87 - 5.11 MIL/uL   Hemoglobin 14.2 12.0 - 15.0 g/dL   HCT 57.8 46.9 - 62.9 %   MCV 89.5 80.0 - 100.0 fL   MCH 30.5 26.0 - 34.0 pg   MCHC 34.1 30.0 - 36.0 g/dL   RDW 52.8 41.3 - 24.4 %   Platelets 201 150 - 400 K/uL   nRBC 0.0 0.0 - 0.2 %   Neutrophils Relative % 82 %   Neutro Abs 8.9 (H) 1.7 - 7.7 K/uL   Lymphocytes Relative 8 %   Lymphs Abs 0.8 0.7 - 4.0 K/uL   Monocytes Relative 9 %   Monocytes Absolute 1.0 0.1 - 1.0 K/uL   Eosinophils Relative 0 %   Eosinophils Absolute 0.0 0.0 - 0.5 K/uL   Basophils Relative 0 %   Basophils Absolute 0.0 0.0 - 0.1 K/uL   Immature Granulocytes 1 %   Abs Immature Granulocytes 0.05 0.00 - 0.07 K/uL    Comment: Performed at [facility], [address], [place], [place] [address]  Basic metabolic panel with GFR     Status: Abnormal   Collection Time: 12/21/23  6:51 AM  Result Value Ref Range   Sodium 129 (L) 135 - 145 mmol/L   Potassium 3.9 3.5 - 5.1 mmol/L   Chloride 96 (L) 98 - 111 mmol/L   CO2 19 (L) 22 - 32 mmol/L   Glucose, Bld 112 (H) 70 - 99 mg/dL    Comment: Glucose reference range applies only to samples taken after fasting for at least 8 hours.   BUN 16 8 - 23 mg/dL   Creatinine, Ser 2.53 0.44 - 1.00 mg/dL   Calcium  9.2 8.9 - 10.3 mg/dL   GFR, Estimated >66 >44 mL/min    Comment: (NOTE)  Calculated using the CKD-EPI Creatinine Equation (2021)    Anion gap 14 5 - 15    Comment: Performed  at [facility], [address] [place], [place] [address]    CT HEAD WO CONTRAST ( ) Result Date: 12/20/2023 EXAM: CT HEAD WITHOUT 12/20/2023 11:59:13 AM TECHNIQUE: CT of the head was performed without the administration of intravenous contrast. Automated exposure control, iterative reconstruction, and/or weight based adjustment of the mA/kV was utilized to reduce the radiation dose to as low as reasonably achievable. COMPARISON: CT angiography of the head and neck and noncontrast head CT on 12/19/2023, and MR head without contrast on 12/18/2023. CLINICAL HISTORY: Head trauma, minor (Age >= 65y). Chief complaints; Altered Mental Status. FINDINGS: BRAIN AND VENTRICLES: There is no acute intracranial hemorrhage, mass effect or midline shift. No abnormal extra-axial fluid collection. The gray-white differentiation is maintained without evidence of an acute infarct. There is no evidence of hydrocephalus. Confluent periventricular and subcortical white matter disease is stable . ORBITS: The visualized portion of the orbits demonstrate no acute abnormality. SINUSES: The visualized paranasal sinuses and mastoid air cells demonstrate no acute abnormality. SOFT TISSUES AND SKULL: No acute abnormality of the visualized skull or soft tissues. VASCULATURE: Atherosclerotic calcifications are present in the cavernous carotid arteries bilaterally. No hyperdense vessel is present. IMPRESSION: 1. No acute intracranial abnormality related to the minor head trauma and altered mental status. 2. Stable confluent periventricular and subcortical white matter disease. Electronically signed by: Audree Leas MD 12/20/2023 04:35 PM EDT RP Workstation: [id number]   DG FEMUR PORT, MIN 2 VIEWS RIGHT Result Date: 12/20/2023 CLINICAL DATA:  Fall.  Right hip pain. EXAM: RIGHT FEMUR PORTABLE 2 VIEW; DG HIP (WITH OR WITHOUT PELVIS) 2-3V RIGHT COMPARISON:  Pelvis and right hip radiographs 12/19/2023 FINDINGS: There is diffuse  decreased bone mineralization. There is a new acute fracture of the mid right femoral neck with mild superior displacement of the distal fracture component and mild varus angulation. The bilateral femoral heads remain normally located. Mild bilateral superolateral acetabular degenerative osteophytosis. Mild bilateral sacroiliac subchondral sclerosis. Normal alignment at the knee. Mild medial compartment of the knee joint space narrowing. IMPRESSION: Acute mildly displaced and angulated fracture of the mid right femoral neck. This was not present on yesterday's radiographs. Electronically Signed   By: Bertina Broccoli M.D.   On: 12/20/2023 12:12   DG HIP UNILAT WITH PELVIS 2-3 VIEWS RIGHT Result Date: 12/20/2023 CLINICAL DATA:  Fall.  Right hip pain. EXAM: RIGHT FEMUR PORTABLE 2 VIEW; DG HIP (WITH OR WITHOUT PELVIS) 2-3V RIGHT COMPARISON:  Pelvis and right hip radiographs 12/19/2023 FINDINGS: There is diffuse decreased bone mineralization. There is a new acute fracture of the mid right femoral neck with mild superior displacement of the distal fracture component and mild varus angulation. The bilateral femoral heads remain normally located. Mild bilateral superolateral acetabular degenerative osteophytosis. Mild bilateral sacroiliac subchondral sclerosis. Normal alignment at the knee. Mild medial compartment of the knee joint space narrowing. IMPRESSION: Acute mildly displaced and angulated fracture of the mid right femoral neck. This was not present on yesterday's radiographs. Electronically Signed   By: Bertina Broccoli M.D.   On: 12/20/2023 12:12   DG HIP PORT UNILAT WITH PELVIS 1V RIGHT Result Date: 12/19/2023 CLINICAL DATA:  Right hip pain. EXAM: DG HIP (WITH OR WITHOUT PELVIS) 1V PORT RIGHT COMPARISON:  None Available. FINDINGS: Mildly decreased bone mineralization. Mild bilateral sacroiliac subchondral sclerosis. The bilateral femoroacetabular joint spaces are maintained. Mild superior pubic symphysis  degenerative spurring. Moderate severe left L4-5 and moderate left L3-4 disc space narrowing. No acute fracture or dislocation. A vascular phlebolith overlies the left hemipelvis. IMPRESSION: 1. Mild bilateral sacroiliac osteoarthritis. 2. Moderate to severe left L4-5 and moderate left L3-4 degenerative disc and endplate changes. Electronically Signed   By: Bertina Broccoli M.D.   On: 12/19/2023 21:02    Review of Systems  Unable to perform ROS: Other   Blood pressure (!) 151/77, pulse 93, temperature 98.9 F (37.2 C), temperature source Axillary, resp. rate 16, height 5\' 6"  (1.676 m), weight 60.1 kg, SpO2 96%. Physical Exam Constitutional:      General: She is not in acute distress.    Appearance: She is well-developed. She is not diaphoretic.  HENT:     Head: Normocephalic and atraumatic.  Eyes:     General:        Right eye: No discharge.        Left eye: No discharge.  Cardiovascular:     Rate and Rhythm: Normal rate and regular rhythm.  Pulmonary:     Effort: Pulmonary effort is normal. No respiratory distress.  Musculoskeletal:     Comments: RLE No traumatic wounds, ecchymosis, or rash  No knee or ankle effusion  Knee stable to varus/ valgus and anterior/posterior stress  Sens DPN, SPN, TN could not assess  Motor EHL, ext, flex, evers could not assess  DP 2+, PT 2+, No significant edema  Skin:    General: Skin is warm and dry.     Assessment/Plan: Right hip fx -- Plan THA today with Dr. Curtiss Dowdy. Please keep NPO. Multiple medical problems including severe aortic stenosis status post TAVR (11/20/2023), vascular dementia, COPD, s/p right heart cath in February 2025, hypertension, hypothyroidism, hyperlipidemia, and acute CVA -- per primary service    Georganna Kin, PA-C Orthopedic Surgery [phone] 12/21/2023, 9:46 AM

## 2023-12-21 NOTE — Transfer of Care (Signed)
 Immediate Anesthesia Transfer of Care Note  Patient: Rella Cardinal  Procedure(s) Performed: ARTHROPLASTY, HIP, TOTAL, ANTERIOR APPROACH (Right: Hip)  Patient Location: PACU  Anesthesia Type:General  Level of Consciousness: alert   Airway & Oxygen Therapy: Patient Spontanous Breathing  Post-op Assessment: Report given to RN  Post vital signs: stable  Last Vitals:  Vitals Value Taken Time  BP 149/80 12/21/23 1441  Temp 36.5 12/21/23  1442  Pulse 92 12/21/23 1445  Resp 20 12/21/23 1445  SpO2 91 % 12/21/23 1445  Vitals shown include unfiled device data.  Last Pain:  Vitals:   12/21/23 1038  TempSrc: Oral  PainSc:          Complications: No notable events documented.

## 2023-12-21 NOTE — Anesthesia Postprocedure Evaluation (Signed)
 Anesthesia Post Note  Patient: [name]  Procedure(s) Performed: ARTHROPLASTY, HIP, TOTAL, ANTERIOR APPROACH (Right: Hip)     Patient location during evaluation: PACU Anesthesia Type: General Level of consciousness: sedated Pain management: pain level controlled Vital Signs Assessment: post-procedure vital signs reviewed and stable Respiratory status: spontaneous breathing, nonlabored ventilation, respiratory function stable and patient connected to nasal cannula oxygen Cardiovascular status: blood pressure returned to baseline and stable Postop Assessment: no apparent nausea or vomiting Anesthetic complications: no  No notable events documented.  Last Vitals:  Vitals:   12/21/23 1515 12/21/23 1519  BP: (!) 111/99   Pulse: 93   Resp: 20   Temp:  36.8 C  SpO2: 95%     Last Pain:  Vitals:   12/21/23 1500  TempSrc:   PainSc: 0-No pain                 Tuleen Mandelbaum,W. EDMOND

## 2023-12-21 NOTE — TOC Progression Note (Signed)
 Transition of Care [facility]) - Progression Note    Patient Details  Name: [name] MRN: [id number] Date of Birth: June 24, 1941  [facility]) CM/SW Contact  Jonathan Neighbor, RN Phone Number: 12/21/2023, 9:56 AM  Clinical Narrative:     Pt to have surgery on fractured hip today. TOC will follow for therapy recommendations post surgery.    Expected Discharge Plan: Skilled Nursing Facility Barriers to Discharge: Continued Medical Work up  Expected Discharge Plan and Services In-house Referral: Clinical Social Work Discharge Planning Services: CM Consult Post Acute Care Choice: Skilled Nursing Facility Living arrangements for the past 2 months: Single Family Home                                       Social Determinants of Health (SDOH) Interventions SDOH Screenings   Food Insecurity: No Food Insecurity (12/18/2023)  Housing: Low Risk  (12/18/2023)  Transportation Needs: No Transportation Needs (12/18/2023)  Utilities: Not At Risk (12/18/2023)  Social Connections: Socially Isolated (12/18/2023)  Tobacco Use: Medium Risk (12/18/2023)    Readmission Risk Interventions    11/21/2023   10:23 AM  Readmission Risk Prevention Plan  Post Dischage Appt Complete  Medication Screening Complete  Transportation Screening Complete

## 2023-12-21 NOTE — Progress Notes (Signed)
 PROGRESS NOTE  [name] ZOX:[id number] DOB: 01/06/1941 DOA: 12/18/2023 PCP: Imelda Man, MD  HPI/Recap of past 24 hours: [name] is a 83 y.o. 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  Associated with unsteady gait, which is not her baseline.  Family thought that she was having a urinary tract infection or dehydration and finally decided to bring her to the ER for further evaluation.  EMS was activated. In the ER, hypertensive, UA negative for pyuria and euvolemic on exam.  Noncontrast head CT did not show any acute intracranial abnormality however MRI brain showed punctate acute ischemic nonhemorrhagic infarct involving the left frontal lobe.  Underlying age-related cerebral atrophy with moderately advanced chronic microvascular ischemic disease. Neurology/stroke team was consulted. Stroke workup was initiated.  Admitted by [facility], hospitalist service.  During admission the patient fell out of bed on 5/21 during the night patient was found on the floor by the son.  Further evaluation revealed that the patient has a subcapital fracture of the neck of the right femur.  Patient is scheduled to undergo open reduction internal fixation today.      Assessment/Plan: Principal Problem:   Closed subcapital fracture of neck of right femur, initial encounter (HCC) Active Problems:   Acute CVA (cerebrovascular accident) (HCC)   Preop cardiovascular exam  Acute CVA, POA Brain MRI revealed small left high frontal punctate stroke (recent TAVR procedure) CT angio head and neck with no emergent finding although suboptimal CT due to bolus timing Echo showed EF of 65 to 70%, no regional wall motion abnormality, grade 1 diastolic dysfunction, no atrial level shunt detected LDL 45,  A1c 5 Permissive hypertension Neurology/stroke team recommend aspirin  and clopidogrel for 3 weeks, then clopidogrel alone.  Recommend 30-day heart monitor after discharge (cardiology notified) Continue neurochecks and monitor on telemetry PT/OT Stroke team following Fall precautions  Right hip fracture 2/2 fall Fell on 5/21 here in the hospital Noted significant pain upon movement around right hip and some bruising Right x-ray hip showed acute fracture, mildly displaced Orthopedics consulted on 5/22, recommend cardiology clearance, plan for surgery on 5/23 Cardiology notified evaluated patient and determined that she is an intermediate risk for the procedure did not recommend any further cardiac workup.  Currently holding aspirin  and Plavix at follow-up the orthopedic surgery. Cardiologist is recommending to start aspirin  81 mg postoperatively.   Orthopedic surgery currently for total hip arthroplasty.    Vascular dementia Continue Namenda  Reorient as needed Fall precautions   Hypothyroidism Resume home levothyroxine    Hyperlipidemia Resume home Crestor   Hypertension Allow for permissive hypertension      Estimated body mass index is 21.39 kg/m as calculated from the following:   Height as of this encounter: 5\' 6"  (1.676 m).   Weight as of this encounter: 60.1 kg.     Code Status: Full  Family Communication: Discussed with daughter at bedside  Disposition Plan: Status is: Observation The patient will require care spanning > 2 midnights and should be moved to inpatient because: level of care      Consultants: Neurology/stroke team Orthopedics  Procedures: None  Antimicrobials: None  DVT prophylaxis: Lovenox    Objective: Vitals:   12/21/23 0436 12/21/23 0527 12/21/23 0747 12/21/23 1038  BP: (!) 188/93 (!) 153/73 (!) 151/77 (!) 161/67  Pulse: [name])  102 98 93 89  Resp: 20 18 16 18   Temp: 98.2 F (36.8 C) 98.2 F (36.8 C) 98.9 F (37.2 C) 98.2 F  (36.8 C)  TempSrc: Oral Oral Axillary Oral  SpO2: 95% 94% 96% 92%  Weight:    60.1 kg  Height:    5\' 6"  (1.676 m)    Intake/Output Summary (Last 24 hours) at 12/21/2023 1342 Last data filed at 12/21/2023 1319 Gross per 24 hour  Intake 250 ml  Output 650 ml  Net -400 ml   Filed Weights   12/18/23 1544 12/21/23 1038  Weight: 60.1 kg 60.1 kg    Exam:  General: NAD  Cardiovascular: S1, S2 present Respiratory: CTAB Abdomen: Soft, nontender, nondistended, bowel sounds present Musculoskeletal: No bilateral pedal edema noted Skin: Normal Psychiatry: Fair mood   Data Reviewed: CBC: Recent Labs  Lab 12/18/23 1535 12/19/23 0422 12/20/23 0958 12/21/23 0651  WBC 9.7 7.4 9.2 10.7*  NEUTROABS 7.6  --   --  8.9*  HGB 11.9* 11.7* 12.4 14.2  HCT 36.1 35.8* 36.4 41.6  MCV 92.6 93.5 88.3 89.5  PLT 209 204 196 201   Basic Metabolic Panel: Recent Labs  Lab 12/18/23 1535 12/19/23 0422 12/20/23 0958 12/21/23 0651  NA 135 135 131* 129*  K 3.8 3.8 3.5 3.9  CL 103 103 100 96*  CO2 21* 21* 22 19*  GLUCOSE 85 76 133* 112*  BUN 20 15 11 16   CREATININE 1.05* 0.85 0.95 0.93  CALCIUM  9.4 9.0 9.1 9.2  MG 2.1 2.0  --   --   PHOS  --  2.4*  --   --    GFR: Estimated Creatinine Clearance: 43.7 mL/min (by C-G formula based on SCr of 0.93 mg/dL). Liver Function Tests: Recent Labs  Lab 12/18/23 1535  AST 28  ALT 17  ALKPHOS 51  BILITOT 0.9  PROT 6.4*  ALBUMIN  3.5   No results for input(s): "LIPASE", "AMYLASE" in the last 168 hours. Recent Labs  Lab 12/18/23 1535  AMMONIA <13   Coagulation Profile: No results for input(s): "INR", "PROTIME" in the last 168 hours. Cardiac Enzymes: No results for input(s): "CKTOTAL", "CKMB", "CKMBINDEX", "TROPONINI" in the last 168 hours. BNP (last 3 results) No results for input(s): "PROBNP" in the last 8760 hours. HbA1C: Recent Labs    12/18/23 2247  HGBA1C 5.0   CBG: No results for input(s): "GLUCAP" in the last 168 hours. Lipid  Profile: Recent Labs    12/19/23 0422  CHOL 131  HDL 77  LDLCALC 45  TRIG 46  CHOLHDL 1.7   Thyroid  Function Tests: No results for input(s): "TSH", "T4TOTAL", "FREET4", "T3FREE", "THYROIDAB" in the last 72 hours. Anemia Panel: No results for input(s): "VITAMINB12", "FOLATE", "FERRITIN", "TIBC", "IRON", "RETICCTPCT" in the last 72 hours. Urine analysis:    Component Value Date/Time   COLORURINE STRAW (A) 12/18/2023 2000   APPEARANCEUR CLEAR 12/18/2023 2000   LABSPEC 1.008 12/18/2023 2000   PHURINE 6.0 12/18/2023 2000   GLUCOSEU NEGATIVE 12/18/2023 2000   HGBUR NEGATIVE 12/18/2023 2000   BILIRUBINUR NEGATIVE 12/18/2023 2000   KETONESUR 20 (A) 12/18/2023 2000   PROTEINUR NEGATIVE 12/18/2023 2000   NITRITE NEGATIVE 12/18/2023 2000   LEUKOCYTESUR TRACE (A) 12/18/2023 2000   Sepsis Labs: @LABRCNTIP (procalcitonin:4,lacticidven:4)  ) Recent Results (from the past 240 hours)  Surgical PCR screen     Status: None   Collection Time: 12/21/23  2:00 AM   Specimen: Nasal Mucosa; Nasal Swab  Result Value Ref Range Status  MRSA, PCR NEGATIVE NEGATIVE Final   Staphylococcus aureus NEGATIVE NEGATIVE Final    Comment: (NOTE) The Xpert SA Assay (FDA approved for NASAL specimens in patients 55 years of age and older), is one component of a comprehensive surveillance program. It is not intended to diagnose infection nor to guide or monitor treatment. Performed at [facility], [address], [place], [place] [address]       Studies: No results found.   Scheduled Meds:  chlorhexidine   60 mL Topical Once   chlorhexidine   15 mL Mouth/Throat Once   Or   mouth rinse  15 mL Mouth Rinse Once   [MAR Hold] dorzolamide -timolol   1 drop Left Eye Q12H   [MAR Hold] latanoprost   1 drop Left Eye QHS   [MAR Hold] levothyroxine   75 mcg Oral Q0600   [MAR Hold] memantine   5 mg Oral QHS   [MAR Hold] multivitamin with minerals  1 tablet Oral QPC lunch   [MAR Hold] mupirocin ointment  1  Application Nasal BID   povidone-iodine  2 Application Topical Once   [MAR Hold] prednisoLONE  acetate  1 drop Right Eye q AM   [MAR Hold] rosuvastatin   5 mg Oral QPC lunch    Continuous Infusions:  lactated ringers         LOS: 0 days     Brandy Graham, MD [facility]  If 7PM-7AM, please contact night-coverage [email or web address] 12/21/2023, 1:42 PM

## 2023-12-21 NOTE — Op Note (Signed)
 Orthopaedic Surgery Operative Note (CSN: [id number] ) Date of Surgery: 12/21/2023  Admit Date: 12/18/2023   Diagnoses: Pre-Op Diagnoses: Right displaced femoral neck fracture   Post-Op Diagnosis: Same  Procedures: CPT 27130-Right total hip arthroplasty for fracture  Surgeons : Primary: Laneta Pintos, MD  Assistant: Alona Jamaica, PA-C  Location: OR 21   Anesthesia: General   Antibiotics: Ancef  2g preop with 1 gm vancomycin powder placed topically   Tourniquet time: None    Estimated Blood Loss: 100 mL  Complications:None   Specimens:* No specimens in log *   Implants: Implant Name Type Inv. Item Serial No. Manufacturer Lot No. LRB No. Used Action  SHELL ACETAB 54 4H HIP - [id number] Shell SHELL ACETAB 54 4H HIP  [employer](ORTH,TRAU,BIO,SG) [id number] Right 1 Implanted  LINER ACETAB NN G7 F 36 - [id number] Liner LINER ACETAB NN G7 F 36  ZIM[employer]ON(ORTH,TRAU,BIO,SG) [id number] Right 1 Implanted  STEM FEM CMTLS TL 12X144 123D - [id number] Stem STEM FEM CMTLS TL 12X144 123D  ZIMMER [employer]ON(ORTH,TRAU,BIO,SG) [id number] Right 1 Implanted  HEAD FEM -6XOFST 36XMDLR - [id number] Orthopedic Implant HEAD FEM -6XOFST 36XMDLR  ZIMMER [employer]ON(ORTH,TRAU,BIO,SG) [id number] Right 1 Implanted     Indications for Surgery: 83 year old female who sustained a ground-level fall while in the hospital with a right displaced femoral neck fracture.  Due to her previous ambulatory status and functional status I recommend proceeding with total hip arthroplasty.  Risks and benefits were discussed with the patient's daughter.  Risks include but not limited to bleeding, infection, hip dislocation, periprosthetic fracture, nerve or blood vessel injury including lateral femoral cutaneous nerve injury, leg length discrepancy, DVT, even the possibility anesthetic complications.  They agreed to proceed with surgery and consent was obtained.  Operative Findings: 1.  Right total hip arthroplasty for displaced femoral  neck fracture using [employer] G7 acetabular 54 mm shell 2.  G7 acetabular system vitamin E highly cross-linked polyethylene neutral liner for 36 mm head 3.  [employer] Taperloc reduced distal uncemented femoral stem size 12 with standard offset 4.  [employer] modular head component cobalt chrome -6, 36 mm head  Procedure: The patient was identified in the preoperative holding area. Consent was confirmed with the patient and their family and all questions were answered. The operative extremity was marked after confirmation with the patient. she was then brought back to the operating room by our anesthesia colleagues.  She was placed under general anesthetic and carefully transferred over to the [facility] table.  Fluoroscopic imaging showed the unstable nature of her injury.  The right hip was then prepped and draped in usual sterile fashion.  A timeout was performed to verify the patient, the procedure, and the extremity.  Preoperative antibiotics were dosed.  A standard anterior approach to the right hip was made and carried down through skin subcutaneous tissue.  Identified the TFL fascia incised this and mobilized the TFL tensor the interval.  I then cauterized the crossing vessels in the inferior portion of the incision.  I then removed the anterior fat pad to expose the capsule.  I made my capsulotomy and proceeded to release it all the way down until I reached the lesser trochanter.  Then I proceeded to make a neck cut using an oscillating saw.  I removed the intercalary neck and proceeded to remove the femoral head with a corkscrew.  This was done without difficulty the femoral head sized approximately 45 mm.  I then proceeded to remove the labrum and the soft tissue  surrounding the acetabulum to expose it appropriately for reaming.  I then sequentially reamed from 45 mm to 53 mm and obtained excellent fit.  Was able to get down to good cancellous bleeding bone.  I then proceeded to press-fit a 54  mm acetabular shell and got excellent fixation.  I then placed a neutral vitamin E highly cross-linked polyethylene.  I then turned my attention to the femur.  The femur was then externally rotated to 125 degrees.  I then delivered the femur through the incision by extending the leg.  Was able to then to do a superior capsular release to expose the femur further and deliver it into the wound more for adequate broaching.  Then used a canal finder to enter the canal and then I sequentially broached until I reached 13 mm and got good fit with this.  I then trialed off with 13 mm stem with a high offset neck but unfortunately with a -3 head this was significantly longer.  I then undersized down and proceeded to trial off this with a standard neck and the leg lengths were much more appropriate.  I then removed the trial components and placed the final standard offset size 12 reduced distal Taperloc stem.  This got excellent rotational control.  I then proceeded to place a -6, 36 mm cobalt chrome head and reduced the hip.  Final fluoroscopic imaging was obtained.  The incision was copiously irrigated.  A gram of vancomycin powder was placed into the incision.  #1 Ethibond was used to close the capsulotomy.  The fascia was closed with 0 Vicryl, the skin was closed with 2-0 Monocryl and 3-0 Monocryl.  Dermabond was used to seal the skin.  Sterile dressing was applied.  The patient was then awoke from anesthesia and taken to the PACU in stable condition.  Post Op Plan/Instructions: The patient be weightbearing as tolerated.  She will have no hip restrictions.  Will have her mobilize physical and Occupational Therapy.  I will recommend aspirin  for DVT prophylaxis.  I was present and performed the entire surgery.  Alona Jamaica, PA-C did assist me throughout the case. An assistant was necessary given the difficulty in approach, maintenance of reduction and ability to instrument the fracture.   Katheryne Pane,  MD [facility]

## 2023-12-21 NOTE — Plan of Care (Signed)
 Maintain on NPO Post midnight. With episode of Elevated blood pressure, informed MD, prn meds given. Problem: Education: Goal: Knowledge of disease or condition will improve Outcome: Progressing   Problem: Ischemic Stroke/TIA Tissue Perfusion: Goal: Complications of ischemic stroke/TIA will be minimized Outcome: Progressing   Problem: Health Behavior/Discharge Planning: Goal: Ability to manage health-related needs will improve Outcome: Progressing   Problem: Nutrition: Goal: Risk of aspiration will decrease Outcome: Progressing   Problem: Self-Care: Goal: Ability to communicate needs accurately will improve Outcome: Progressing   Problem: Education: Goal: Knowledge of General Education information will improve Description: Including pain rating scale, medication(s)/side effects and non-pharmacologic comfort measures Outcome: Progressing   Problem: Clinical Measurements: Goal: Will remain free from infection Outcome: Progressing   Problem: Activity: Goal: Risk for activity intolerance will decrease Outcome: Progressing   Problem: Nutrition: Goal: Adequate nutrition will be maintained Outcome: Progressing   Problem: Pain Managment: Goal: General experience of comfort will improve and/or be controlled Outcome: Progressing   Problem: Safety: Goal: Ability to remain free from injury will improve Outcome: Progressing   Problem: Skin Integrity: Goal: Risk for impaired skin integrity will decrease Outcome: Progressing

## 2023-12-21 NOTE — Discharge Instructions (Signed)
 [facility] Discharge Instructions   General Discharge Instructions  WEIGHT BEARING STATUS:weightbearing as tolerated  RANGE OF MOTION/ACTIVITY: unrestricted range of motion  Wound Care: Leave dressing in place until follow-up. If dressing becomes soiled, please remove, clean incision and change dressing daily  DVT/PE prophylaxis: Aspirin   Diet: as you were eating previously.  Can use over the counter stool softeners and bowel preparations, such as Miralax, to help with bowel movements.  Narcotics can be constipating.  Be sure to drink plenty of fluids  PAIN MEDICATION USE AND EXPECTATIONS  You have likely been given narcotic medications to help control your pain.  After a traumatic event that results in an fracture (broken bone) with or without surgery, it is ok to use narcotic pain medications to help control one's pain.  We understand that everyone responds to pain differently and each individual patient will be evaluated on a regular basis for the continued need for narcotic medications. Ideally, narcotic medication use should last no more than 6-8 weeks (coinciding with fracture healing).   As a patient it is your responsibility as well to monitor narcotic medication use and report the amount and frequency you use these medications when you come to your office visit.   We would also advise that if you are using narcotic medications, you should take a dose prior to therapy to maximize you participation.  IF YOU ARE ON NARCOTIC MEDICATIONS IT IS NOT PERMISSIBLE TO OPERATE A MOTOR VEHICLE (MOTORCYCLE/CAR/TRUCK/MOPED) OR HEAVY MACHINERY DO NOT MIX NARCOTICS WITH OTHER CNS (CENTRAL NERVOUS SYSTEM) DEPRESSANTS SUCH AS ALCOHOL  POST-OPERATIVE OPIOID TAPER INSTRUCTIONS: It is important to wean off of your opioid medication as soon as possible. If you do not need pain medication after your surgery it is ok to stop day one. Opioids include: Codeine, Hydrocodone(Norco,  Vicodin), Oxycodone (Percocet, oxycontin ) and hydromorphone amongst others.  Long term and even short term use of opiods can cause: Increased pain response Dependence Constipation Depression Respiratory depression And more.  Withdrawal symptoms can include Flu like symptoms Nausea, vomiting And more Techniques to manage these symptoms Hydrate well Eat regular healthy meals Stay active Use relaxation techniques(deep breathing, meditating, yoga) Do Not substitute Alcohol to help with tapering If you have been on opioids for less than two weeks and do not have pain than it is ok to stop all together.  Plan to wean off of opioids This plan should start within one week post op of your fracture surgery  Maintain the same interval or time between taking each dose and first decrease the dose.  Cut the total daily intake of opioids by one tablet each day Next start to increase the time between doses. The last dose that should be eliminated is the evening dose.    STOP SMOKING OR USING NICOTINE PRODUCTS!!!!  As discussed nicotine severely impairs your body's ability to heal surgical and traumatic wounds but also impairs bone healing.  Wounds and bone heal by forming microscopic blood vessels (angiogenesis) and nicotine is a vasoconstrictor (essentially, shrinks blood vessels).  Therefore, if vasoconstriction occurs to these microscopic blood vessels they essentially disappear and are unable to deliver necessary nutrients to the healing tissue.  This is one modifiable factor that you can do to dramatically increase your chances of healing your injury.  (This means no smoking, no nicotine gum, patches, etc)  DO NOT USE NONSTEROIDAL ANTI-INFLAMMATORY DRUGS (NSAID'S)  Using products such as Advil (ibuprofen), Aleve (naproxen), Motrin (ibuprofen) for additional pain control during fracture healing  can delay and/or prevent the healing response.  If you would like to take over the counter (OTC)  medication, Tylenol  (acetaminophen ) is ok.  However, some narcotic medications that are given for pain control contain acetaminophen  as well. Therefore, you should not exceed more than 4000 mg of tylenol  in a day if you do not have liver disease.  Also note that there are may OTC medicines, such as cold medicines and allergy medicines that my contain tylenol  as well.  If you have any questions about medications and/or interactions please ask your doctor/PA or your pharmacist.      ICE AND ELEVATE INJURED/OPERATIVE EXTREMITY  Using ice and elevating the injured extremity above your heart can help with swelling and pain control.  Icing in a pulsatile fashion, such as 20 minutes on and 20 minutes off, can be followed.    Do not place ice directly on skin. Make sure there is a barrier between to skin and the ice pack.    Using frozen items such as frozen peas works well as the conform nicely to the are that needs to be iced.  USE AN ACE WRAP OR TED HOSE FOR SWELLING CONTROL  In addition to icing and elevation, Ace wraps or TED hose are used to help limit and resolve swelling.  It is recommended to use Ace wraps or TED hose until you are informed to stop.    When using Ace Wraps start the wrapping distally (farthest away from the body) and wrap proximally (closer to the body)   Example: If you had surgery on your leg or thing and you do not have a splint on, start the ace wrap at the toes and work your way up to the thigh        If you had surgery on your upper extremity and do not have a splint on, start the ace wrap at your fingers and work your way up to the upper arm   CALL THE OFFICE FOR MEDICATION REFILLS OR WITH ANY QUESTIONS/CONCERNS: [phone]   VISIT OUR WEBSITE FOR ADDITIONAL INFORMATION: [email or web address]     Call office for the following: Temperature greater than 101F Persistent nausea and vomiting Severe uncontrolled pain Redness, tenderness, or signs of infection (pain,  swelling, redness, odor or green/yellow discharge around the site) Difficulty breathing, headache or visual disturbances Hives Persistent dizziness or light-headedness Extreme fatigue Any other questions or concerns you may have after discharge  In an emergency, call 911 or go to an Emergency Department at a nearby hospital  OTHER HELPFUL INFORMATION  If you had a block, it will wear off between 8-24 hrs postop typically.  This is period when your pain may go from nearly zero to the pain you would have had postop without the block.  This is an abrupt transition but nothing dangerous is happening.  You may take an extra dose of narcotic when this happens.  You should wean off your narcotic medicines as soon as you are able.  Most patients will be off or using minimal narcotics before their first postop appointment.   We suggest you use the pain medication the first night prior to going to bed, in order to ease any pain when the anesthesia wears off. You should avoid taking pain medications on an empty stomach as it will make you nauseous.  Do not drink alcoholic beverages or take illicit drugs when taking pain medications.  In most states it is against the law to drive while you are  in a splint or sling.  And certainly against the law to drive while taking narcotics.  You may return to work/school in the next couple of days when you feel up to it.   Pain medication may make you constipated.  Below are a few solutions to try in this order: Decrease the amount of pain medication if you aren't having pain. Drink lots of decaffeinated fluids. Drink prune juice and/or each dried prunes  If the first 3 don't work start with additional solutions Take Colace - an over-the-counter stool softener Take Senokot - an over-the-counter laxative Take Miralax - a stronger over-the-counter laxative

## 2023-12-21 NOTE — Consult Note (Signed)
 Reason for Consult:Right hip fx Referring Physician: Eppie Hasting Time called: 0730 Time at bedside: 0936   [name] is an 83 y.o. female.  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 She was then found on the floor the night before last. X-rays showed a right hip fx and orthopedic surgery was consulted. Prior to the CVA she was living at home with her daughter and was not using any assistive devices to ambulate.  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 Abel Hoe & Dr. Honey Lusty   Scoliosis    Severe aortic stenosis    Wears glasses     Past Surgical History:  Procedure Laterality Date   CESAREAN SECTION  07/31/1976   TWINS, ONE WAS STILLBORN   INTRAOPERATIVE TRANSTHORACIC ECHOCARDIOGRAM N/A 11/20/2023   Procedure: ECHOCARDIOGRAM, TRANSTHORACIC;  Surgeon: Odie Benne, MD;  Location: [facility];  Service: Cardiovascular;  Laterality: N/A;   MOUTH SURGERY  07/31/1994   tongue resection   NODULE REMOVED     BENIGN, LOWER NECK   RIGHT HEART CATH AND CORONARY ANGIOGRAPHY N/A 09/21/2023   Procedure: RIGHT HEART CATH AND CORONARY ANGIOGRAPHY;  Surgeon: Odie Benne, MD;  Location: [facility];  Service: Cardiovascular;  Laterality: N/A;    Family History  Problem Relation Age of Onset   Thyroid  disease Mother    Dementia Mother    Thyroid  disease Sister     Social History:  reports that she quit smoking about 29 years ago. Her smoking use included cigarettes. She started smoking about 31 years ago. She has a 0.4 pack-year smoking history. She has never used smokeless tobacco. She reports that she  does not drink alcohol and does not use drugs.  Allergies:  Allergies  Allergen Reactions   Brimonidine Itching    Red eyes   Fluocinolone Acetonide Other (See Comments)    Unable to recall   Naphazoline-Polyethyl Glycol     THAT YOU USE TO DILATE YOUR EYES.Aaron AasMAKES BP GO UP, CAN ONLY USE HALF STRENGTH   Triamcinolone Acetonide Other (See Comments)    Unable to recall   Tropicamide Other (See Comments)    Elevates IOP OK to use 0.5%    Medications: I have reviewed the patient's current medications.  Results for orders placed or performed during the hospital encounter of 12/18/23 (from the past 48 hours)  CBC     Status: None   Collection Time: 12/20/23  9:58 AM  Result Value Ref Range   WBC 9.2 4.0 - 10.5 K/uL   RBC 4.12 3.87 - 5.11 MIL/uL   Hemoglobin 12.4 12.0 - 15.0 g/dL   HCT 16.1 09.6 - 04.5 %   MCV 88.3 80.0 - 100.0 fL   MCH 30.1 26.0 - 34.0 pg   MCHC 34.1 30.0 - 36.0 g/dL   RDW 40.9 81.1 - 91.4 %   Platelets 196 150 - 400 K/uL   nRBC 0.0 0.0 - 0.2 %    Comment: Performed at [facility], [address], [place], [place] [address]  Basic metabolic panel     Status: Abnormal   Collection Time: 12/20/23  9:58 AM  Result Value Ref Range   Sodium 131 (L) 135 - 145 mmol/L   Potassium 3.5 3.5 - 5.1 mmol/L   Chloride 100 98 - 111 mmol/L   CO2 22 22 - 32 mmol/L   Glucose, Bld 133 (H) 70 - 99 mg/dL    Comment: Glucose reference range applies only to samples taken after fasting for at least 8 hours.   BUN 11 8 - 23 mg/dL   Creatinine, Ser 6.04 0.44 - 1.00 mg/dL   Calcium  9.1 8.9 - 10.3 mg/dL   GFR, Estimated 60 (L) >60 mL/min    Comment: (NOTE) Calculated using the CKD-EPI Creatinine Equation (2021)    Anion gap 9 5 - 15    Comment: Performed at [facility], [address] [place], [place] [address]  Surgical PCR screen     Status: None   Collection Time: 12/21/23  2:00 AM   Specimen: Nasal Mucosa; Nasal Swab  Result Value Ref Range   MRSA, PCR  NEGATIVE NEGATIVE   Staphylococcus aureus NEGATIVE NEGATIVE    Comment: (NOTE) The Xpert SA Assay (FDA approved for NASAL specimens in patients 78 years of age and older), is one component of a comprehensive surveillance program. It is not intended to diagnose infection nor to guide or monitor treatment. Performed at [facility], [address] [place], [place] [address]   CBC with Differential/Platelet     Status: Abnormal   Collection Time: 12/21/23  6:51 AM  Result Value Ref Range   WBC 10.7 (H) 4.0 - 10.5 K/uL   RBC 4.65 3.87 - 5.11 MIL/uL   Hemoglobin 14.2 12.0 - 15.0 g/dL   HCT 78.2 95.6 - 21.3 %   MCV 89.5 80.0 - 100.0 fL   MCH 30.5 26.0 - 34.0 pg   MCHC 34.1 30.0 - 36.0 g/dL   RDW 08.6 57.8 - 46.9 %   Platelets 201 150 - 400 K/uL   nRBC 0.0 0.0 - 0.2 %   Neutrophils Relative % 82 %   Neutro Abs 8.9 (H) 1.7 - 7.7 K/uL   Lymphocytes Relative 8 %   Lymphs Abs 0.8 0.7 - 4.0 K/uL   Monocytes Relative 9 %   Monocytes Absolute 1.0 0.1 - 1.0 K/uL   Eosinophils Relative 0 %   Eosinophils Absolute 0.0 0.0 - 0.5 K/uL   Basophils Relative 0 %   Basophils Absolute 0.0 0.0 - 0.1 K/uL   Immature Granulocytes 1 %   Abs Immature Granulocytes 0.05 0.00 - 0.07 K/uL    Comment: Performed at [facility], [address], [place], [place] [address]  Basic metabolic panel with GFR     Status: Abnormal   Collection Time: 12/21/23  6:51 AM  Result Value Ref Range   Sodium 129 (L) 135 - 145 mmol/L   Potassium 3.9 3.5 - 5.1 mmol/L   Chloride 96 (L) 98 - 111 mmol/L   CO2 19 (L) 22 - 32 mmol/L   Glucose, Bld 112 (H) 70 - 99 mg/dL    Comment: Glucose reference range applies only to samples taken after fasting for at least 8 hours.   BUN 16 8 - 23 mg/dL   Creatinine, Ser 8.41 0.44 - 1.00 mg/dL   Calcium  9.2 8.9 - 10.3 mg/dL   GFR, Estimated >32 >44 mL/min    Comment: (NOTE)  Calculated using the CKD-EPI Creatinine Equation (2021)    Anion gap 14 5 - 15    Comment: Performed  at [facility], [address] [place], [place] [address]    CT HEAD WO CONTRAST ( ) Result Date: 12/20/2023 EXAM: CT HEAD WITHOUT 12/20/2023 11:59:13 AM TECHNIQUE: CT of the head was performed without the administration of intravenous contrast. Automated exposure control, iterative reconstruction, and/or weight based adjustment of the mA/kV was utilized to reduce the radiation dose to as low as reasonably achievable. COMPARISON: CT angiography of the head and neck and noncontrast head CT on 12/19/2023, and MR head without contrast on 12/18/2023. CLINICAL HISTORY: Head trauma, minor (Age >= 65y). Chief complaints; Altered Mental Status. FINDINGS: BRAIN AND VENTRICLES: There is no acute intracranial hemorrhage, mass effect or midline shift. No abnormal extra-axial fluid collection. The gray-white differentiation is maintained without evidence of an acute infarct. There is no evidence of hydrocephalus. Confluent periventricular and subcortical white matter disease is stable . ORBITS: The visualized portion of the orbits demonstrate no acute abnormality. SINUSES: The visualized paranasal sinuses and mastoid air cells demonstrate no acute abnormality. SOFT TISSUES AND SKULL: No acute abnormality of the visualized skull or soft tissues. VASCULATURE: Atherosclerotic calcifications are present in the cavernous carotid arteries bilaterally. No hyperdense vessel is present. IMPRESSION: 1. No acute intracranial abnormality related to the minor head trauma and altered mental status. 2. Stable confluent periventricular and subcortical white matter disease. Electronically signed by: Audree Leas MD 12/20/2023 04:35 PM EDT RP Workstation: [id number]   DG FEMUR PORT, MIN 2 VIEWS RIGHT Result Date: 12/20/2023 CLINICAL DATA:  Fall.  Right hip pain. EXAM: RIGHT FEMUR PORTABLE 2 VIEW; DG HIP (WITH OR WITHOUT PELVIS) 2-3V RIGHT COMPARISON:  Pelvis and right hip radiographs 12/19/2023 FINDINGS: There is diffuse  decreased bone mineralization. There is a new acute fracture of the mid right femoral neck with mild superior displacement of the distal fracture component and mild varus angulation. The bilateral femoral heads remain normally located. Mild bilateral superolateral acetabular degenerative osteophytosis. Mild bilateral sacroiliac subchondral sclerosis. Normal alignment at the knee. Mild medial compartment of the knee joint space narrowing. IMPRESSION: Acute mildly displaced and angulated fracture of the mid right femoral neck. This was not present on yesterday's radiographs. Electronically Signed   By: Bertina Broccoli M.D.   On: 12/20/2023 12:12   DG HIP UNILAT WITH PELVIS 2-3 VIEWS RIGHT Result Date: 12/20/2023 CLINICAL DATA:  Fall.  Right hip pain. EXAM: RIGHT FEMUR PORTABLE 2 VIEW; DG HIP (WITH OR WITHOUT PELVIS) 2-3V RIGHT COMPARISON:  Pelvis and right hip radiographs 12/19/2023 FINDINGS: There is diffuse decreased bone mineralization. There is a new acute fracture of the mid right femoral neck with mild superior displacement of the distal fracture component and mild varus angulation. The bilateral femoral heads remain normally located. Mild bilateral superolateral acetabular degenerative osteophytosis. Mild bilateral sacroiliac subchondral sclerosis. Normal alignment at the knee. Mild medial compartment of the knee joint space narrowing. IMPRESSION: Acute mildly displaced and angulated fracture of the mid right femoral neck. This was not present on yesterday's radiographs. Electronically Signed   By: Bertina Broccoli M.D.   On: 12/20/2023 12:12   DG HIP PORT UNILAT WITH PELVIS 1V RIGHT Result Date: 12/19/2023 CLINICAL DATA:  Right hip pain. EXAM: DG HIP (WITH OR WITHOUT PELVIS) 1V PORT RIGHT COMPARISON:  None Available. FINDINGS: Mildly decreased bone mineralization. Mild bilateral sacroiliac subchondral sclerosis. The bilateral femoroacetabular joint spaces are maintained. Mild superior pubic symphysis  degenerative spurring. Moderate severe left L4-5 and moderate left L3-4 disc space narrowing. No acute fracture or dislocation. A vascular phlebolith overlies the left hemipelvis. IMPRESSION: 1. Mild bilateral sacroiliac osteoarthritis. 2. Moderate to severe left L4-5 and moderate left L3-4 degenerative disc and endplate changes. Electronically Signed   By: Bertina Broccoli M.D.   On: 12/19/2023 21:02    Review of Systems  Unable to perform ROS: Other   Blood pressure (!) 151/77, pulse 93, temperature 98.9 F (37.2 C), temperature source Axillary, resp. rate 16, height 5\' 6"  (1.676 m), weight 60.1 kg, SpO2 96%. Physical Exam Constitutional:      General: She is not in acute distress.    Appearance: She is well-developed. She is not diaphoretic.  HENT:     Head: Normocephalic and atraumatic.  Eyes:     General:        Right eye: No discharge.        Left eye: No discharge.  Cardiovascular:     Rate and Rhythm: Normal rate and regular rhythm.  Pulmonary:     Effort: Pulmonary effort is normal. No respiratory distress.  Musculoskeletal:     Comments: RLE No traumatic wounds, ecchymosis, or rash  No knee or ankle effusion  Knee stable to varus/ valgus and anterior/posterior stress  Sens DPN, SPN, TN could not assess  Motor EHL, ext, flex, evers could not assess  DP 2+, PT 2+, No significant edema  Skin:    General: Skin is warm and dry.     Assessment/Plan: Right hip fx -- Plan THA today with Dr. Curtiss Dowdy. Please keep NPO. Multiple medical problems including severe aortic stenosis status post TAVR (11/20/2023), vascular dementia, COPD, s/p right heart cath in February 2025, hypertension, hypothyroidism, hyperlipidemia, and acute CVA -- per primary service    Georganna Kin, PA-C Orthopedic Surgery [phone] 12/21/2023, 9:46 AM

## 2023-12-21 NOTE — Anesthesia Preprocedure Evaluation (Addendum)
 Anesthesia Evaluation  Patient identified by MRN, date of birth, ID band Patient awake    Reviewed: Allergy & Precautions, H&P , NPO status , Patient's Chart, lab work & pertinent test results, reviewed documented beta blocker date and time   Airway Mallampati: III  TM Distance: >3 FB Neck ROM: Full    Dental no notable dental hx. (+) Teeth Intact, Dental Advisory Given   Pulmonary COPD, former smoker   Pulmonary exam normal breath sounds clear to auscultation       Cardiovascular Exercise Tolerance: Good hypertension, Pt. on medications and Pt. on home beta blockers  Rhythm:Regular Rate:Normal     Neuro/Psych CVA, Residual Symptoms  negative psych ROS   GI/Hepatic negative GI ROS, Neg liver ROS,,,  Endo/Other  Hypothyroidism    Renal/GU negative Renal ROS  negative genitourinary   Musculoskeletal   Abdominal   Peds  Hematology negative hematology ROS (+)   Anesthesia Other Findings   Reproductive/Obstetrics negative OB ROS                             Anesthesia Physical Anesthesia Plan  ASA: 3  Anesthesia Plan: General   Post-op Pain Management: Ofirmev  IV (intra-op)*   Induction: Intravenous  PONV Risk Score and Plan: 3 and Ondansetron , Dexamethasone and Treatment may vary due to age or medical condition  Airway Management Planned: Oral ETT  Additional Equipment: None  Intra-op Plan:   Post-operative Plan: Extubation in OR  Informed Consent: I have reviewed the patients History and Physical, chart, labs and discussed the procedure including the risks, benefits and alternatives for the proposed anesthesia with the patient or authorized representative who has indicated his/her understanding and acceptance.     Dental advisory given  Plan Discussed with: Anesthesiologist and CRNA  Anesthesia Plan Comments: (  )       Anesthesia Quick Evaluation

## 2023-12-21 NOTE — Progress Notes (Signed)
 PT Cancellation Note  Patient Details Name: [name] MRN: [id number] DOB: 1941-06-23   Cancelled Treatment:    Reason Eval/Treat Not Completed: Other (comment) (Pt with new hip fx. Plan for OR today. Will follow up tomorrow post surgery.)   [name] 12/21/2023, 9:44 AM

## 2023-12-21 NOTE — Progress Notes (Signed)
 OT Cancellation Note  Patient Details Name: MARYANA PITTMON MRN: [id number] DOB: [birth year]   Cancelled Treatment:    Reason Eval/Treat Not Completed: Other (comment) (Pt with new hip fx s/p fall. Will reassess s/p surgery)  Jayliah Benett,HILLARY 12/21/2023, 7:32 AM Milburn Aliment, OT/L   Acute OT Clinical Specialist Acute Rehabilitation Services Pager [phone] Office [phone]

## 2023-12-21 NOTE — Interval H&P Note (Signed)
 History and Physical Interval Note:  12/21/2023 11:04 AM  [name]  has presented today for surgery, with the diagnosis of RIGHT HIP FRACTURE.  The various methods of treatment have been discussed with the patient and family. After consideration of risks, benefits and other options for treatment, the patient has consented to  Procedure(s) with comments: ARTHROPLASTY, HIP, TOTAL, ANTERIOR APPROACH (Right) - HEMI HIP as a surgical intervention.  The patient's history has been reviewed, patient examined, no change in status, stable for surgery.  I have reviewed the patient's chart and labs.  Questions were answered to the patient's satisfaction.     [name] [name]

## 2023-12-22 DIAGNOSIS — S72011A Unspecified intracapsular fracture of right femur, initial encounter for closed fracture: Secondary | ICD-10-CM | POA: Diagnosis not present

## 2023-12-22 LAB — CBC
HCT: 27.6 % — ABNORMAL LOW (ref 36.0–46.0)
Hemoglobin: 9.4 g/dL — ABNORMAL LOW (ref 12.0–15.0)
MCH: 30.7 pg (ref 26.0–34.0)
MCHC: 34.1 g/dL (ref 30.0–36.0)
MCV: 90.2 fL (ref 80.0–100.0)
Platelets: 191 10*3/uL (ref 150–400)
RBC: 3.06 MIL/uL — ABNORMAL LOW (ref 3.87–5.11)
RDW: 13.2 % (ref 11.5–15.5)
WBC: 9.9 10*3/uL (ref 4.0–10.5)
nRBC: 0 % (ref 0.0–0.2)

## 2023-12-22 LAB — COMPREHENSIVE METABOLIC PANEL WITH GFR
ALT: 14 U/L (ref 0–44)
AST: 31 U/L (ref 15–41)
Albumin: 3.1 g/dL — ABNORMAL LOW (ref 3.5–5.0)
Alkaline Phosphatase: 38 U/L (ref 38–126)
Anion gap: 10 (ref 5–15)
BUN: 25 mg/dL — ABNORMAL HIGH (ref 8–23)
CO2: 21 mmol/L — ABNORMAL LOW (ref 22–32)
Calcium: 8.4 mg/dL — ABNORMAL LOW (ref 8.9–10.3)
Chloride: 99 mmol/L (ref 98–111)
Creatinine, Ser: 1.17 mg/dL — ABNORMAL HIGH (ref 0.44–1.00)
GFR, Estimated: 47 mL/min — ABNORMAL LOW (ref >60)
Glucose, Bld: 106 mg/dL — ABNORMAL HIGH (ref 70–99)
Potassium: 4 mmol/L (ref 3.5–5.1)
Sodium: 130 mmol/L — ABNORMAL LOW (ref 135–145)
Total Bilirubin: 0.6 mg/dL (ref 0.0–1.2)
Total Protein: 5.8 g/dL — ABNORMAL LOW (ref 6.5–8.1)

## 2023-12-22 LAB — GLUCOSE, CAPILLARY: Glucose-Capillary: 121 mg/dL — ABNORMAL HIGH (ref 70–99)

## 2023-12-22 LAB — VITAMIN D 25 HYDROXY (VIT D DEFICIENCY, FRACTURES): Vit D, 25-Hydroxy: 41.66 ng/mL (ref 30–100)

## 2023-12-22 MED ORDER — ENSURE ENLIVE PO LIQD
237.0000 mL | Freq: Two times a day (BID) | ORAL | Status: DC
Start: 2023-12-22 — End: 2023-12-27
  Administered 2023-12-22 – 2023-12-27 (×10): 237 mL via ORAL

## 2023-12-22 MED ORDER — SODIUM CHLORIDE 0.9 % IV SOLN: INTRAVENOUS | Status: AC

## 2023-12-22 MED ORDER — CLOPIDOGREL BISULFATE 75 MG PO TABS
75.0000 mg | ORAL_TABLET | Freq: Every day | ORAL | Status: DC
  Administered 2023-12-22 – 2023-12-24 (×3): 75 mg via ORAL
  Filled 2023-12-22 (×3): qty 1

## 2023-12-22 MED ORDER — ASPIRIN 81 MG PO TBEC
81.0000 mg | DELAYED_RELEASE_TABLET | Freq: Every day | ORAL | Status: DC
  Administered 2023-12-22 – 2023-12-24 (×3): 81 mg via ORAL
  Filled 2023-12-22 (×3): qty 1

## 2023-12-22 NOTE — Progress Notes (Signed)
 Rounding Note    Patient Name: [name] Date of Encounter: 12/22/2023  [facility] Cardiologist: Antoinette Batman, MD   Subjective   BP 112/54.  Creatinine 1.17, hemoglobin 9.4. She is confused, oriented to person only  Inpatient Medications    Scheduled Meds:  aspirin  EC  81 mg Oral Q breakfast   dorzolamide -timolol   1 drop Left Eye Q12H   feeding supplement  237 mL Oral BID BM   latanoprost   1 drop Left Eye QHS   levothyroxine   75 mcg Oral Q0600   memantine   5 mg Oral QHS   multivitamin with minerals  1 tablet Oral QPC lunch   mupirocin ointment  1 Application Nasal BID   prednisoLONE  acetate  1 drop Right Eye q AM   rosuvastatin   5 mg Oral QPC lunch   sodium chloride  flush  3-10 mL Intravenous Q12H   Continuous Infusions:  PRN Meds: acetaminophen , haloperidol lactate, hydrALAZINE , HYDROcodone-acetaminophen , HYDROcodone-acetaminophen , labetalol , lip balm, melatonin, menthol-cetylpyridinium **OR** phenol, morphine  injection, polyvinyl alcohol, prochlorperazine, senna-docusate, sodium chloride  flush   Vital Signs    Vitals:   12/22/23 0756 12/22/23 0837 12/22/23 0851 12/22/23 1124  BP: (!) 142/58 (!) 122/59  (!) 112/54  Pulse: 87   91  Resp: 17 20  17   Temp: 98.6 F (37 C) 97.7 F (36.5 C)  98.4 F (36.9 C)  TempSrc: Oral Axillary  Oral  SpO2: 98% 99% 97% 95%  Weight:      Height:        Intake/Output Summary (Last 24 hours) at 12/22/2023 1203 Last data filed at 12/22/2023 0826 Gross per 24 hour  Intake 2034.33 ml  Output 350 ml  Net 1684.33 ml      12/21/2023   10:38 AM 12/18/2023    3:44 PM 11/29/2023    3:45 PM  Last 3 Weights  Weight (lbs) 132 lb 7.9 oz 132 lb 7.9 oz 132 lb 9.6 oz  Weight (kg) 60.1 kg 60.1 kg 60.147 kg      Telemetry    Sinus tachycardia in 100s - Personally Reviewed  ECG    No new ECG - Personally Reviewed  Physical Exam   GEN: No acute distress.   Neck: No JVD Cardiac: tachycardic, regular, no  murmurs, rubs, or gallops.  Respiratory: Clear to auscultation bilaterally. GI: Soft, nontender, non-distended  MS: No edema; No deformity. Neuro:  Oriented to person only  Labs    High Sensitivity Troponin:  No results for input(s): "TROPONINIHS" in the last 720 hours.   Chemistry Recent Labs  Lab 12/18/23 1535 12/19/23 0422 12/20/23 0958 12/21/23 0651 12/22/23 0743  NA 135 135 131* 129* 130*  K 3.8 3.8 3.5 3.9 4.0  CL 103 103 100 96* 99  CO2 21* 21* 22 19* 21*  GLUCOSE 85 76 133* 112* 106*  BUN 20 15 11 16  25*  CREATININE 1.05* 0.85 0.95 0.93 1.17*  CALCIUM  9.4 9.0 9.1 9.2 8.4*  MG 2.1 2.0  --   --   --   PROT 6.4*  --   --   --  5.8*  ALBUMIN  3.5  --   --   --  3.1*  AST 28  --   --   --  31  ALT 17  --   --   --  14  ALKPHOS 51  --   --   --  38  BILITOT 0.9  --   --   --  0.6  GFRNONAA  53* >60 60* >60 47*  ANIONGAP 11 11 9 14 10     Lipids  Recent Labs  Lab 12/19/23 0422  CHOL 131  TRIG 46  HDL 77  LDLCALC 45  CHOLHDL 1.7    Hematology Recent Labs  Lab 12/20/23 0958 12/21/23 0651 12/22/23 0743  WBC 9.2 10.7* 9.9  RBC 4.12 4.65 3.06*  HGB 12.4 14.2 9.4*  HCT 36.4 41.6 27.6*  MCV 88.3 89.5 90.2  MCH 30.1 30.5 30.7  MCHC 34.1 34.1 34.1  RDW 13.1 13.3 13.2  PLT 196 201 191   Thyroid  No results for input(s): "TSH", "FREET4" in the last 168 hours.  BNPNo results for input(s): "BNP", "PROBNP" in the last 168 hours.  DDimer No results for input(s): "DDIMER" in the last 168 hours.   Radiology    DG HIP UNILAT W OR W/O PELVIS 2-3 VIEWS RIGHT Result Date: 12/21/2023 CLINICAL DATA:  Postop EXAM: DG HIP (WITH OR WITHOUT PELVIS) 2-3V RIGHT COMPARISON:  12/20/2023 FINDINGS: Interval right hip replacement with normal alignment. Gas in the soft tissues consistent with recent surgery. IMPRESSION: Status post right hip replacement with expected postsurgical change. Electronically Signed   By: Esmeralda Hedge M.D.   On: 12/21/2023 18:09   DG HIP UNILAT WITH PELVIS  1V RIGHT Result Date: 12/21/2023 CLINICAL DATA:  Elective surgery EXAM: DG HIP (WITH OR WITHOUT PELVIS) 1V RIGHT COMPARISON:  12/20/2023 FINDINGS: Multiple low resolution intraoperative spot views of the right hip. Total fluoroscopy time was 43.3 seconds, fluoroscopic dose of 4.6 mGy. Images were obtained during a right hip replacement. IMPRESSION: Intraoperative fluoroscopic assistance provided during right hip replacement. Electronically Signed   By: Esmeralda Hedge M.D.   On: 12/21/2023 18:09   DG C-Arm 1-60 Min-No Report Result Date: 12/21/2023 Fluoroscopy was utilized by the requesting physician.  No radiographic interpretation.   DG C-Arm 1-60 Min-No Report Result Date: 12/21/2023 Fluoroscopy was utilized by the requesting physician.  No radiographic interpretation.    Cardiac Studies     Patient Profile     83 y.o. female with a history of severe aortic stenosis status post TAVR 10/2023, nonobstructive CAD, COPD, hypertension, hyperlipidemia, hypothyroidism, dementia consulted for preoperative evaluation   Assessment & Plan    Status post recent TAVR 10/2023 Stable function on most recent echocardiogram..  She had been started on Plavix after her acute CVA.  Agree with holding Plavix in setting of surgery for hip fracture, but would continue aspirin  81 mg daily   Acute CVA MRI revealing small left frontal punctate infarct.  Managed by neurology.  Felt to be cardioembolic.  30-day heart monitor already ordered.  No evidence of atrial fibrillation here.  Potentially could be secondary to valve.  Neurology recommended aspirin  times Plavix x 3 weeks but Plavix currently on hold following hip fracture.  Continue aspirin  81 mg daily   Hypertension Currently normotensive off medication  Right hip fracture Had fall in hospital on 5/21, suffered hip fracture.  Underwent surgery on 5/23    For questions or updates, please contact [facility] Please consult [email or web address] for  contact info under        Signed, Wendie Hamburg, MD  12/22/2023, 12:03 PM

## 2023-12-22 NOTE — Plan of Care (Signed)
  Problem: Ischemic Stroke/TIA Tissue Perfusion: Goal: Complications of ischemic stroke/TIA will be minimized Outcome: Progressing   Problem: Self-Care: Goal: Ability to communicate needs accurately will improve Outcome: Progressing   Problem: Nutrition: Goal: Dietary intake will improve Outcome: Progressing   Problem: Nutrition: Goal: Adequate nutrition will be maintained Outcome: Progressing

## 2023-12-22 NOTE — Progress Notes (Signed)
 Physical Therapy Treatment Patient Details Name: [name] MRN: [id number] DOB: 1941/05/02 Today's Date: 12/22/2023   History of Present Illness 83 y.o. female presents to [facility] 12/18/23 with intermittent confusion. Brain MRI showed small punctate L frontal CVA. Pt with fall while admitted leading to R femoral neck fx, s/p R THA on 12/21/23. PMHx: COPD, osteoporosis, severe aortic stenosis status post TAVR, diagnosed mild Alzheimer's and vascular dementia    PT Comments  Pt today seen for re-eval after fall resulting in femur fx with subsequent R THA yesterday. Co-treat with OT. Pt today required Max A for bed mobility and +2 Mod A to transfer to chair via step pivot transfer. Patient will benefit from continued inpatient follow up therapy, <3 hours/day. PT will continue to follow.      If plan is discharge home, recommend the following: A lot of help with walking and/or transfers;A lot of help with bathing/dressing/bathroom;Assistance with cooking/housework;Direct supervision/assist for medications management;Direct supervision/assist for financial management;Assist for transportation;Help with stairs or ramp for entrance   Can travel by private vehicle     No  Equipment Recommendations  Rolling walker (2 wheels)    Recommendations for Other Services Rehab consult     Precautions / Restrictions Precautions Precautions: Fall Recall of Precautions/Restrictions: Impaired Precaution/Restrictions Comments: Alz/Dementia at baseline, fear of falling. Watch HR Restrictions Weight Bearing Restrictions Per Provider Order: Yes RLE Weight Bearing Per Provider Order: Weight bearing as tolerated     Mobility  Bed Mobility Overal bed mobility: Needs Assistance Bed Mobility: Rolling, Sidelying to Sit Rolling: Max assist Sidelying to sit: Max assist, Used rails, HOB elevated       General bed mobility comments: Max A for positioning of RLE and mutlimodal cues to sequence.     Transfers Overall transfer level: Needs assistance Equipment used: 2 person hand held assist Transfers: Sit to/from Stand, Bed to chair/wheelchair/BSC Sit to Stand: Mod assist, +2 physical assistance, +2 safety/equipment   Step pivot transfers: Mod assist, +2 safety/equipment, +2 physical assistance       General transfer comment: Pt hesistant, cues needed to sequence pivot to recliner.    Ambulation/Gait               General Gait Details: Deferred   Stairs             Wheelchair Mobility     Tilt Bed    Modified Rankin (Stroke Patients Only) Modified Rankin (Stroke Patients Only) Pre-Morbid Rankin Score: No symptoms Modified Rankin: Moderate disability     Balance Overall balance assessment: Needs assistance, History of Falls Sitting-balance support: No upper extremity supported, Feet supported Sitting balance-Leahy Scale: Fair Sitting balance - Comments: sitting EOB with CGA   Standing balance support: Bilateral upper extremity supported, During functional activity Standing balance-Leahy Scale: Poor Standing balance comment: reliant on external support                            Communication Communication Communication: No apparent difficulties  Cognition Arousal: Alert Behavior During Therapy: Anxious   PT - Cognitive impairments: History of cognitive impairments, Orientation, Memory, Attention, Sequencing, Problem solving, Safety/Judgement                       PT - Cognition Comments: Frequent cueing for sequencing and to adhere to task at hand Following commands: Impaired Following commands impaired: Follows one step commands inconsistently, Follows one step commands with increased time  Cueing Cueing Techniques: Verbal cues, Gestural cues, Tactile cues  Exercises      General Comments General comments (skin integrity, edema, etc.): VSS      Pertinent Vitals/Pain Pain Assessment Pain Assessment:  Faces Faces Pain Scale: Hurts little more Pain Location: R hip with bed mobility Pain Descriptors / Indicators: Discomfort, Sore, Guarding Pain Intervention(s): Monitored during session, Limited activity within patient's tolerance, Repositioned    Home Living Family/patient expects to be discharged to:: Private residence Living Arrangements: Children (daughter) Available Help at Discharge: Family;Available PRN/intermittently Type of Home: House Home Access: Stairs to enter Entrance Stairs-Rails: None Entrance Stairs-Number of Steps: 3   Home Layout: One level Home Equipment: None;Other (comment) (cameras inside the house for daughter to make sure mom is ok while shes at work) Additional Comments: daughter works during the day,    Prior Function            PT Goals (current goals can now be found in the care plan section) Progress towards PT goals: Progressing toward goals    Frequency    Min 2X/week      PT Plan      Co-evaluation PT/OT/SLP Co-Evaluation/Treatment: Yes Reason for Co-Treatment: Necessary to address cognition/behavior during functional activity;To address functional/ADL transfers;For patient/therapist safety PT goals addressed during session: Mobility/safety with mobility;Balance;Strengthening/ROM OT goals addressed during session: ADL's and self-care;Strengthening/ROM      AM-PAC PT "6 Clicks" Mobility   Outcome Measure  Help needed turning from your back to your side while in a flat bed without using bedrails?: A Lot Help needed moving from lying on your back to sitting on the side of a flat bed without using bedrails?: A Lot Help needed moving to and from a bed to a chair (including a wheelchair)?: A Lot Help needed standing up from a chair using your arms (e.g., wheelchair or bedside chair)?: A Lot Help needed to walk in hospital room?: Total Help needed climbing 3-5 steps with a railing? : Total 6 Click Score: 10    End of Session Equipment  Utilized During Treatment: Gait belt Activity Tolerance: Patient tolerated treatment well Patient left: in bed;with call bell/phone within reach Nurse Communication: Mobility status PT Visit Diagnosis: Unsteadiness on feet (R26.81);Other abnormalities of gait and mobility (R26.89);Muscle weakness (generalized) (M62.81);History of falling (Z91.81)     Time: 7829-5621 PT Time Calculation (min) (ACUTE ONLY): 33 min  Charges:      PT General Charges $$ ACUTE PT VISIT: 1 Visit                     Rodgers Clack, PT, DPT Acute Rehab Services [phone]    Ivey Cina 12/22/2023, 3:38 PM

## 2023-12-22 NOTE — Progress Notes (Addendum)
 PROGRESS NOTE    [name]  JJO:[id number] DOB: February 17, 1941 DOA: 12/18/2023 PCP: Imelda Man, MD   Brief Narrative:    [name] is a 83 y.o. 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  Associated with unsteady gait, which is not her baseline.  Family thought that she was having a urinary tract infection or dehydration and finally decided to bring her to the ER for further evaluation.  She was noted to have acute ischemic CVA and underwent evaluation per neurology with recommendations for DAPT for 3 weeks and then Plavix alone along with 30-day heart monitor after discharge.  Was noted to have a fall out of bed 5/20 of the right femoral neck and underwent operative repair 5/23 for this.  PT/OT evaluation note pending.  Assessment & Plan:   Principal Problem:   Closed subcapital fracture of neck of right femur, initial encounter (HCC) Active Problems:   Acute CVA (cerebrovascular accident) (HCC)   Preop cardiovascular exam  Assessment and Plan:  Acute CVA, POA Brain MRI revealed small left high frontal punctate stroke (recent TAVR procedure) CT angio head and neck with no emergent finding although suboptimal CT due to bolus timing Echo showed EF of 65 to 70%, no regional wall motion abnormality, grade 1 diastolic dysfunction, no atrial level shunt detected LDL 45, A1c 5 Permissive hypertension Neurology/stroke team recommend aspirin  and clopidogrel for 3 weeks, then clopidogrel alone.  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 Cardiology following Orthopedic surgery continues to follow  Hyponatremia Gentle time-limited NS with Cr elevation Recheck in am  Postoperative anemia Continue to monitor CBC and consider transfusion for hemoglobin less than 7   Vascular dementia Continue Namenda  Reorient as needed Fall precautions   Hypothyroidism Resume home levothyroxine    Hyperlipidemia Resume home Crestor    Hypertension Allow for permissive hypertension   DVT prophylaxis: SCDs Code Status: Full Family Communication: Son at bedside 5/24 Disposition Plan: SNF after PT evaluation. Status is: Inpatient Remains inpatient appropriate because: Need for IV medications and further evaluation with likely need for placement.   Consultants:  Neurology Cardiology Orthopedics  Procedures:  Right total hip arthroplasty 12/21/23  Antimicrobials:  Anti-infectives (From admission, onward)    Start     Dose/Rate Route Frequency Ordered Stop   12/21/23 1800  ceFAZolin  (ANCEF ) IVPB 2g/100 mL premix        2 g 200 mL/hr over 30 Minutes Intravenous Every 6 hours 12/21/23 1540 12/22/23 0102   12/21/23 1401  vancomycin (VANCOCIN) powder  Status:  Discontinued          As needed 12/21/23 1402 12/21/23 1437   12/21/23 1053  ceFAZolin  (ANCEF ) 2-4 GM/100ML-% IVPB  Note to Pharmacy: Devoria Font O: cabinet override      12/21/23 1053 12/21/23 1248   12/21/23 1045  ceFAZolin  (ANCEF ) IVPB 2g/100 mL premix        2 g 200 mL/hr over 30 Minutes Intravenous On call to O.R.  12/21/23 1037 12/21/23 1217       Subjective: Patient seen and evaluated today with no new acute complaints or concerns. Denies significant L hip pain. No acute concerns or events noted overnight.  Objective: Vitals:   12/22/23 0756 12/22/23 0837 12/22/23 0851 12/22/23 1124  BP: (!) 142/58 (!) 122/59  (!) 112/54  Pulse: 87   91  Resp: 17 20  17   Temp: 98.6 F (37 C) 97.7 F (36.5 C)  98.4 F (36.9 C)  TempSrc: Oral Axillary  Oral  SpO2: 98% 99% 97% 95%  Weight:      Height:        Intake/Output Summary (Last 24 hours) at 12/22/2023 1211 Last data filed at 12/22/2023 0826 Gross per 24 hour  Intake 2034.33 ml  Output 350 ml  Net 1684.33 ml   Filed Weights   12/18/23 1544 12/21/23 1038  Weight: 60.1 kg 60.1 kg    Examination:  General exam: Appears calm and comfortable  Respiratory system: Clear to auscultation. Respiratory effort normal. Cardiovascular system: S1 & S2 heard, RRR.  Gastrointestinal system: Abdomen is soft Central nervous system: Alert and awake Extremities: No edema Skin: No significant lesions noted; L hip incision C/D/I Psychiatry: Flat affect.    Data Reviewed: I have personally reviewed following labs and imaging studies  CBC: Recent Labs  Lab 12/18/23 1535 12/19/23 0422 12/20/23 0958 12/21/23 0651 12/22/23 0743  WBC 9.7 7.4 9.2 10.7* 9.9  NEUTROABS 7.6  --   --  8.9*  --   HGB 11.9* 11.7* 12.4 14.2 9.4*  HCT 36.1 35.8* 36.4 41.6 27.6*  MCV 92.6 93.5 88.3 89.5 90.2  PLT 209 204 196 201 191   Basic Metabolic Panel: Recent Labs  Lab 12/18/23 1535 12/19/23 0422 12/20/23 0958 12/21/23 0651 12/22/23 0743  NA 135 135 131* 129* 130*  K 3.8 3.8 3.5 3.9 4.0  CL 103 103 100 96* 99  CO2 21* 21* 22 19* 21*  GLUCOSE 85 76 133* 112* 106*  BUN 20 15 11 16  25*  CREATININE 1.05* 0.85 0.95 0.93 1.17*  CALCIUM  9.4 9.0 9.1 9.2 8.4*  MG 2.1 2.0  --   --   --   PHOS  --  2.4*  --   --   --    GFR: Estimated Creatinine Clearance: 34.7  mL/min (A) (by C-G formula based on SCr of 1.17 mg/dL (H)). Liver Function Tests: Recent Labs  Lab 12/18/23 1535 12/22/23 0743  AST 28 31  ALT 17 14  ALKPHOS 51 38  BILITOT 0.9 0.6  PROT 6.4* 5.8*  ALBUMIN  3.5 3.1*   No results for input(s): "LIPASE", "AMYLASE" in the last 168 hours. Recent Labs  Lab 12/18/23 1535  AMMONIA <13   Coagulation Profile: No results for input(s): "INR", "PROTIME" in the last 168 hours. Cardiac Enzymes: No results for input(s): "CKTOTAL", "CKMB", "CKMBINDEX", "TROPONINI" in the last 168 hours. BNP (last 3 results) No results for input(s): "PROBNP" in the last 8760 hours. HbA1C: No results for input(s): "HGBA1C" in the last 72 hours. CBG: No results for input(s): "GLUCAP" in the last 168 hours. Lipid Profile: No results for input(s): "CHOL", "HDL", "LDLCALC", "TRIG", "CHOLHDL", "LDLDIRECT" in the last 72 hours.  Thyroid  Function Tests: No results for input(s): "TSH", "T4TOTAL", "FREET4", "T3FREE", "THYROIDAB" in the last 72 hours. Anemia Panel: No results for input(s): "VITAMINB12", "FOLATE", "FERRITIN", "TIBC", "IRON", "RETICCTPCT" in the last 72 hours. Sepsis Labs: No results for input(s): "PROCALCITON", "LATICACIDVEN" in the last 168 hours.  Recent Results (from the past 240 hours)  Surgical PCR screen     Status: None   Collection Time: 12/21/23  2:00 AM   Specimen: Nasal Mucosa; Nasal Swab  Result Value Ref Range Status   MRSA, PCR NEGATIVE NEGATIVE Final   Staphylococcus aureus NEGATIVE NEGATIVE Final    Comment: (NOTE) The Xpert SA Assay (FDA approved for NASAL specimens in patients 20 years of age and older), is one component of a comprehensive surveillance program. It is not intended to diagnose infection nor to guide or monitor treatment. Performed at [facility], [address] [place], [place] [address]          Radiology Studies: DG HIP UNILAT W OR W/O PELVIS 2-3 VIEWS RIGHT Result Date: 12/21/2023 CLINICAL  DATA:  Postop EXAM: DG HIP (WITH OR WITHOUT PELVIS) 2-3V RIGHT COMPARISON:  12/20/2023 FINDINGS: Interval right hip replacement with normal alignment. Gas in the soft tissues consistent with recent surgery. IMPRESSION: Status post right hip replacement with expected postsurgical change. Electronically Signed   By: Esmeralda Hedge M.D.   On: 12/21/2023 18:09   DG HIP UNILAT WITH PELVIS 1V RIGHT Result Date: 12/21/2023 CLINICAL DATA:  Elective surgery EXAM: DG HIP (WITH OR WITHOUT PELVIS) 1V RIGHT COMPARISON:  12/20/2023 FINDINGS: Multiple low resolution intraoperative spot views of the right hip. Total fluoroscopy time was 43.3 seconds, fluoroscopic dose of 4.6 mGy. Images were obtained during a right hip replacement. IMPRESSION: Intraoperative fluoroscopic assistance provided during right hip replacement. Electronically Signed   By: Esmeralda Hedge M.D.   On: 12/21/2023 18:09   DG C-Arm 1-60 Min-No Report Result Date: 12/21/2023 Fluoroscopy was utilized by the requesting physician.  No radiographic interpretation.   DG C-Arm 1-60 Min-No Report Result Date: 12/21/2023 Fluoroscopy was utilized by the requesting physician.  No radiographic interpretation.        Scheduled Meds:  aspirin  EC  81 mg Oral Q breakfast   clopidogrel  75 mg Oral Daily   dorzolamide -timolol   1 drop Left Eye Q12H   feeding supplement  237 mL Oral BID BM   latanoprost   1 drop Left Eye QHS   levothyroxine   75 mcg Oral Q0600   memantine   5 mg Oral QHS   multivitamin with minerals  1 tablet Oral QPC lunch   mupirocin ointment  1 Application Nasal BID   prednisoLONE  acetate  1 drop Right Eye q AM   rosuvastatin   5 mg Oral QPC lunch   sodium chloride  flush  3-10 mL Intravenous Q12H     LOS: 1 day    Time spent: 55 minutes    Chen Holzman Loran Rock, DO [facility]  If 7PM-7AM, please contact night-coverage [email or web address] 12/22/2023, 12:11 PM

## 2023-12-22 NOTE — Evaluation (Signed)
 Occupational Therapy Re-Evaluation Patient Details Name: [name] MRN: [id number] DOB: 19-May-1941 Today's Date: 12/22/2023   History of Present Illness   83 y.o. female presents to [facility] 12/18/23 with intermittent confusion. Brain MRI showed small punctate L frontal CVA. Pt with fall while admitted leading to R femoral neck fx, s/p R THA on 12/21/23. PMHx: COPD, osteoporosis, severe aortic stenosis status post TAVR, diagnosed mild Alzheimer's and vascular dementia     Clinical Impressions Pt admitted for above, now s/p R THA following a fall and WBAT on affected LE. Pt not c/o intolerable pain and able to perform small knee flexion but unable to fully lift RLE against gravity, she remains confused and anxious (very nervous about falling). Pt now needing mod A +2 for STS and pivot transfers and total A to CGA for ADLs. Pt requires a lot of reassurance and multi modal cues to sequence functional mobility. OT to continue following pt acutely to address listed deficits and help transition to next level of care. Patient will benefit from continued inpatient follow up therapy, <3 hours/day HOWEVER a rehab consult was placed as pt has potential to make significant progress within a reasonable time frame and was ind PTA, would need increased supervision upon DC home if able.      If plan is discharge home, recommend the following:   Assistance with cooking/housework;Direct supervision/assist for medications management;Direct supervision/assist for financial management;Assist for transportation;Help with stairs or ramp for entrance;A lot of help with walking and/or transfers;A lot of help with bathing/dressing/bathroom     Functional Status Assessment   Patient has had a recent decline in their functional status and demonstrates the ability to make significant improvements in function in a reasonable and predictable amount of time.     Equipment Recommendations   Tub/shower seat;Wheelchair  (measurements OT);BSC/3in1;Wheelchair cushion (measurements OT)     Recommendations for Other Services   Rehab consult     Precautions/Restrictions   Precautions Precautions: Fall Recall of Precautions/Restrictions: Impaired Precaution/Restrictions Comments: Alz/Dementia at baseline, fear of falling. Watch HR Restrictions Weight Bearing Restrictions Per Provider Order: Yes RLE Weight Bearing Per Provider Order: Weight bearing as tolerated     Mobility Bed Mobility Overal bed mobility: Needs Assistance Bed Mobility: Rolling, Sidelying to Sit Rolling: Max assist Sidelying to sit: Max assist, Used rails, HOB elevated       General bed mobility comments: Max A for positioning of RLE and mutlimodal cues to sequence.    Transfers Overall transfer level: Needs assistance Equipment used: 2 person hand held assist Transfers: Sit to/from Stand, Bed to chair/wheelchair/BSC Sit to Stand: Mod assist, +2 physical assistance, +2 safety/equipment     Step pivot transfers: Mod assist, +2 safety/equipment, +2 physical assistance     General transfer comment: Pt hesistant, cues needed to sequence pivot to recliner.      Balance Overall balance assessment: Needs assistance, History of Falls Sitting-balance support: No upper extremity supported, Feet supported Sitting balance-Leahy Scale: Fair Sitting balance - Comments: sitting EOB with CGA   Standing balance support: Bilateral upper extremity supported, During functional activity Standing balance-Leahy Scale: Poor Standing balance comment: reliant on external support                           ADL either performed or assessed with clinical judgement   ADL Overall ADL's : Needs assistance/impaired Eating/Feeding: Set up;Bed level   Grooming: Sitting;Contact guard assist   Upper Body Bathing: Minimal assistance;Sitting  Lower Body Bathing: Moderate assistance;Sitting/lateral leans   Upper Body Dressing :  Sitting;Minimal assistance;Cueing for sequencing   Lower Body Dressing: Total assistance;Sitting/lateral leans;Sit to/from stand   Toilet Transfer: Moderate assistance;+2 for physical assistance;+2 for safety/equipment;BSC/3in1;Stand-pivot Toilet Transfer Details (indicate cue type and reason): simulated with recliner, Bilat HHA Toileting- Clothing Manipulation and Hygiene: Maximal assistance;Sit to/from stand       Functional mobility during ADLs: Moderate assistance;+2 for safety/equipment;Cueing for sequencing (HHA with pivot transfer) General ADL Comments: needs a lot of reassurance     Vision         Perception         Praxis         Pertinent Vitals/Pain Pain Assessment Pain Assessment: Faces Faces Pain Scale: Hurts little more Pain Location: R hip with bed mobility Pain Descriptors / Indicators: Discomfort, Sore, Guarding Pain Intervention(s): Monitored during session, Limited activity within patient's tolerance, Repositioned, Other (comment) (RN informed daugther that he intends to give pain medications after mobilizing and pt more aroused)     Extremity/Trunk Assessment Upper Extremity Assessment Upper Extremity Assessment: Generalized weakness   Lower Extremity Assessment Lower Extremity Assessment: Generalized weakness;RLE deficits/detail RLE Deficits / Details: recent THA 12/21/23       Communication Communication Communication: No apparent difficulties   Cognition Arousal: Alert Behavior During Therapy: Anxious Cognition: History of cognitive impairments, Cognition impaired   Orientation impairments: Situation, Time Awareness: Intellectual awareness impaired, Online awareness impaired Memory impairment (select all impairments): Short-term memory, Working [employer] fast streamer, Conservation officer, historic buildings Attention impairment (select first level of impairment): Sustained attention, Selective attention Executive functioning impairment (select all impairments):  Organization, Reasoning, Problem solving, Sequencing OT - Cognition Comments: gets very anxious and fearful                 Following commands: Impaired Following commands impaired: Follows one step commands inconsistently, Follows one step commands with increased time     Cueing  General Comments   Cueing Techniques: Verbal cues;Gestural cues;Tactile cues  Pt daughter present, pt positioned with legs elevated and propped on pillows. BP while semi reclined in recliner 117/57. Pt HR sitting EOB up to 147 bpm despite being sedentary. Needs  calming.   Exercises     Shoulder Instructions      Home Living Family/patient expects to be discharged to:: Private residence Living Arrangements: Children (daughter) Available Help at Discharge: Family;Available PRN/intermittently Type of Home: House Home Access: Stairs to enter [employer] of Steps: 3 Entrance Stairs-Rails: None Home Layout: One level     Bathroom Shower/Tub: Chief Strategy Officer: Standard     Home Equipment: None;Other (comment) (cameras inside the house for daughter to make sure mom is ok while shes at work)   Additional Comments: daughter works during the day,      Prior Functioning/Environment Prior Level of Function : Independent/Modified Independent;History of Falls (last six months)             Mobility Comments: Ind with no AD, will furniture surf, likes to walk to [employer] and get mail. and likes to go on walks around the neighborhood with daughter. Multiple recent falls ADLs Comments: does not drive, typically does small cooking and cleaning and showers standing up, daughter manages medicines and finanaces    OT Problem List: Decreased strength;Decreased activity tolerance;Impaired balance (sitting and/or standing);Decreased cognition;Decreased safety awareness;Decreased knowledge of use of DME or AE;Pain;Decreased knowledge of precautions   OT Treatment/Interventions:  Self-care/ADL training;DME and/or AE instruction;Therapeutic activities;Patient/family education;Balance training  OT Goals(Current goals can be found in the care plan section)   Acute Rehab OT Goals Patient Stated Goal: To get better, back to baseline OT Goal Formulation: With patient/family Time For Goal Achievement: 01/05/24 Potential to Achieve Goals: Good ADL Goals Pt Will Perform Lower Body Bathing: with set-up;sitting/lateral leans Pt Will Perform Lower Body Dressing: sitting/lateral leans;sit to/from stand;with min assist Pt Will Transfer to Toilet: stand pivot transfer;with contact guard assist;bedside commode Additional ADL Goal #2: Pt will complete bed mobility with min A as a precursor to seated ADLs Additional ADL Goal #4: Pt will follow 75% of commands during therapy session.   OT Frequency:  Min 2X/week    Co-evaluation PT/OT/SLP Co-Evaluation/Treatment: Yes Reason for Co-Treatment: Necessary to address cognition/behavior during functional activity;To address functional/ADL transfers;For patient/therapist safety PT goals addressed during session: Mobility/safety with mobility;Balance;Strengthening/ROM OT goals addressed during session: ADL's and self-care;Strengthening/ROM      AM-PAC OT "6 Clicks" Daily Activity     Outcome Measure Help from another person eating meals?: A Little Help from another person taking care of personal grooming?: A Little Help from another person toileting, which includes using toliet, bedpan, or urinal?: A Lot Help from another person bathing (including washing, rinsing, drying)?: A Lot Help from another person to put on and taking off regular upper body clothing?: A Little Help from another person to put on and taking off regular lower body clothing?: Total 6 Click Score: 14   End of Session Equipment Utilized During Treatment: Gait belt Nurse Communication: Need for lift equipment;Precautions (+2 stedy, watch HR)  Activity  Tolerance: Patient tolerated treatment well Patient left: in chair;with call bell/phone within reach;with chair alarm set;with family/visitor present  OT Visit Diagnosis: Unsteadiness on feet (R26.81);Repeated falls (R29.6);History of falling (Z91.81);Other symptoms and signs involving the nervous system (R29.898);Other symptoms and signs involving cognitive function;Other abnormalities of gait and mobility (R26.89);Pain Pain - Right/Left: Right Pain - part of body: Hip                Time: 4132-4401 OT Time Calculation (min): 33 min Charges:  OT General Charges $OT Visit: 1 Visit OT Evaluation $OT Eval Moderate Complexity: 1 Mod  12/22/2023  AB, OTR/L  Acute Rehabilitation Services  Office: [phone]   Jorene New 12/22/2023, 2:44 PM

## 2023-12-22 NOTE — Progress Notes (Signed)
   ORTHOPAEDIC PROGRESS NOTE  s/p Procedure(s): ARTHROPLASTY, HIP, TOTAL, ANTERIOR APPROACH  SUBJECTIVE: Reports mild pain about operative site. Son at bedside. She has difficulty with her memory.    OBJECTIVE: PE: General: sitting up in hospital bed, NAD RLE: dressing CDI, warm well perfused foot, intact EHL/TA/GSC   Vitals:   12/22/23 0837 12/22/23 0851  BP: (!) 122/59   Pulse:    Resp: 20   Temp: 97.7 F (36.5 C)   SpO2: 99% 97%   Stable post-op images.   ASSESSMENT: [name] is a 83 y.o. female POD#1  PLAN: Weightbearing: WBAT RLE Insicional and dressing care: Reinforce dressings as needed Orthopedic device(s): None Showering: Hold for now VTE prophylaxis: Lovenox . Ambulation Pain control: PRN pain medications, minimize narcotics as able Follow - up plan: 2 weeks in office with Dr. Curtiss Dowdy Dispo: TBD. PT/OT evals today. Anticipate SNF.  Contact information:  After hours and holidays please check [email or web address] for group call information for [facility]  Nicholas Bari, PA-C 12/22/23

## 2023-12-23 DIAGNOSIS — S72011A Unspecified intracapsular fracture of right femur, initial encounter for closed fracture: Secondary | ICD-10-CM | POA: Diagnosis not present

## 2023-12-23 LAB — CBC
HCT: 25.4 % — ABNORMAL LOW (ref 36.0–46.0)
Hemoglobin: 8.7 g/dL — ABNORMAL LOW (ref 12.0–15.0)
MCH: 30.9 pg (ref 26.0–34.0)
MCHC: 34.3 g/dL (ref 30.0–36.0)
MCV: 90.1 fL (ref 80.0–100.0)
Platelets: 208 10*3/uL (ref 150–400)
RBC: 2.82 MIL/uL — ABNORMAL LOW (ref 3.87–5.11)
RDW: 13.2 % (ref 11.5–15.5)
WBC: 8.1 10*3/uL (ref 4.0–10.5)
nRBC: 0 % (ref 0.0–0.2)

## 2023-12-23 LAB — BASIC METABOLIC PANEL WITH GFR
Anion gap: 6 (ref 5–15)
BUN: 20 mg/dL (ref 8–23)
CO2: 24 mmol/L (ref 22–32)
Calcium: 8.3 mg/dL — ABNORMAL LOW (ref 8.9–10.3)
Chloride: 102 mmol/L (ref 98–111)
Creatinine, Ser: 0.86 mg/dL (ref 0.44–1.00)
GFR, Estimated: >60 mL/min (ref >60)
Glucose, Bld: 103 mg/dL — ABNORMAL HIGH (ref 70–99)
Potassium: 3.7 mmol/L (ref 3.5–5.1)
Sodium: 132 mmol/L — ABNORMAL LOW (ref 135–145)

## 2023-12-23 LAB — MAGNESIUM: Magnesium: 2.2 mg/dL (ref 1.7–2.4)

## 2023-12-23 MED ORDER — METOPROLOL TARTRATE 5 MG/5ML IV SOLN: 2.5000 mg | INTRAVENOUS | Status: AC | PRN

## 2023-12-23 MED ORDER — SODIUM CHLORIDE 0.9 % IV SOLN: INTRAVENOUS | Status: AC

## 2023-12-23 NOTE — Progress Notes (Signed)
 PROGRESS NOTE    [name]  ZOX:[id number] DOB: [birth year] DOA: 12/18/2023 PCP: Imelda Man, MD   Brief Narrative:    [name] is a 83 y.o. 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  Associated with unsteady gait, which is not her baseline.  Family thought that she was having a urinary tract infection or dehydration and finally decided to bring her to the ER for further evaluation.  She was noted to have acute ischemic CVA and underwent evaluation per neurology with recommendations for DAPT for 3 weeks and then Plavix alone along with 30-day heart monitor after discharge.  Was noted to have a fall out of bed 5/20 of the right femoral neck and underwent operative repair 5/23 for this.  PT/OT evaluation recommending SNF.  Assessment & Plan:   Principal Problem:   Closed subcapital fracture of neck of right femur, initial encounter (HCC) Active Problems:   Cerebrovascular accident (CVA) (HCC)   Preop cardiovascular exam  Assessment and Plan:  Acute CVA, POA Brain MRI revealed small left high frontal punctate stroke (recent TAVR procedure) CT angio head and neck with no emergent finding although suboptimal CT due to bolus timing Echo showed EF of 65 to 70%, no regional wall motion abnormality, grade 1 diastolic dysfunction, no atrial level shunt detected LDL 45, A1c 5 Permissive hypertension Neurology/stroke team recommend aspirin  and clopidogrel for 3 weeks, then clopidogrel alone.  Recommend 30-day heart monitor after discharge (cardiology notified) Continue neurochecks and monitor on telemetry PT/OT evaluation recommending SNF Fall precautions Okay to resume back on aspirin  and Plavix per orthopedics 5/24   Right hip fracture 2/2 fall status post right  hip total arthroplasty 5/23 Fell on 5/21 here in the hospital Noted significant pain upon movement around right hip and some bruising Right x-ray hip showed acute fracture, mildly displaced Orthopedics consulted on 5/22, recommend cardiology clearance, plan for surgery on 5/23 Cardiology notified evaluated patient and determined that she is an intermediate risk for the procedure did not recommend any further cardiac workup. Cardiology following Orthopedic surgery continues to follow  Hyponatremia-improving Gentle time-limited NS with Cr elevation Recheck in am  Postoperative anemia Continue to monitor CBC and consider transfusion for hemoglobin less than 7   Vascular dementia Continue Namenda  Reorient as needed Fall precautions   Hypothyroidism Resume home levothyroxine    Hyperlipidemia Resume home Crestor    Hypertension Allow for permissive hypertension   DVT prophylaxis: SCDs Code Status: Full Family Communication: Son at bedside 5/25 Disposition Plan: SNF placement Status is: Inpatient Remains inpatient appropriate because: Need for IV medications and further evaluation with likely need for placement.   Consultants:  Neurology Cardiology Orthopedics  Procedures:  Right total hip arthroplasty 12/21/23  Antimicrobials:  Anti-infectives (From admission, onward)    Start     Dose/Rate Route Frequency Ordered Stop   12/21/23 1800  ceFAZolin  (ANCEF ) IVPB 2g/100 mL premix        2 g 200 mL/hr over 30 Minutes Intravenous Every 6 hours 12/21/23 1540 12/22/23 0102   12/21/23 1401  vancomycin (VANCOCIN) powder  Status:  Discontinued          As needed 12/21/23 1402 12/21/23 1437   12/21/23 1053  ceFAZolin  (ANCEF ) 2-4 GM/100ML-% IVPB       Note to Pharmacy: Devoria Font  O: cabinet override      12/21/23 1053 12/21/23 1248   12/21/23 1045  ceFAZolin  (ANCEF ) IVPB 2g/100 mL premix        2 g 200 mL/hr over 30 Minutes Intravenous On call to O.R. 12/21/23 1037 12/21/23  1217       Subjective: Patient seen and evaluated today with no new acute complaints or concerns. Denies significant L hip pain. No acute concerns or events noted overnight.  Objective: Vitals:   12/22/23 2017 12/23/23 0021 12/23/23 0415 12/23/23 0817  BP: 131/61 (!) 122/53 (!) 134/56 (!) 139/58  Pulse: 100 89 99 (!) 108  Resp: 16 16 16    Temp: 98.6 F (37 C) 99 F (37.2 C) 98.5 F (36.9 C)   TempSrc: Oral Oral Oral   SpO2: 96% 92% 95% 96%  Weight:      Height:        Intake/Output Summary (Last 24 hours) at 12/23/2023 1037 Last data filed at 12/23/2023 0640 Gross per 24 hour  Intake 610 ml  Output 900 ml  Net -290 ml   Filed Weights   12/18/23 1544 12/21/23 1038  Weight: 60.1 kg 60.1 kg    Examination:  General exam: Appears calm and comfortable  Respiratory system: Clear to auscultation. Respiratory effort normal. Cardiovascular system: S1 & S2 heard, RRR.  Gastrointestinal system: Abdomen is soft Central nervous system: Alert and awake Extremities: No edema Skin: No significant lesions noted; L hip incision C/D/I Psychiatry: Flat affect.    Data Reviewed: I have personally reviewed following labs and imaging studies  CBC: Recent Labs  Lab 12/18/23 1535 12/19/23 0422 12/20/23 0958 12/21/23 0651 12/22/23 0743 12/23/23 0701  WBC 9.7 7.4 9.2 10.7* 9.9 8.1  NEUTROABS 7.6  --   --  8.9*  --   --   HGB 11.9* 11.7* 12.4 14.2 9.4* 8.7*  HCT 36.1 35.8* 36.4 41.6 27.6* 25.4*  MCV 92.6 93.5 88.3 89.5 90.2 90.1  PLT 209 204 196 201 191 208   Basic Metabolic Panel: Recent Labs  Lab 12/18/23 1535 12/19/23 0422 12/20/23 0958 12/21/23 0651 12/22/23 0743 12/23/23 0701  NA 135 135 131* 129* 130* 132*  K 3.8 3.8 3.5 3.9 4.0 3.7  CL 103 103 100 96* 99 102  CO2 21* 21* 22 19* 21* 24  GLUCOSE 85 76 133* 112* 106* 103*  BUN 20 15 11 16  25* 20  CREATININE 1.05* 0.85 0.95 0.93 1.17* 0.86  CALCIUM  9.4 9.0 9.1 9.2 8.4* 8.3*  MG 2.1 2.0  --   --   --  2.2   PHOS  --  2.4*  --   --   --   --    GFR: Estimated Creatinine Clearance: 47.2 mL/min (by C-G formula based on SCr of 0.86 mg/dL). Liver Function Tests: Recent Labs  Lab 12/18/23 1535 12/22/23 0743  AST 28 31  ALT 17 14  ALKPHOS 51 38  BILITOT 0.9 0.6  PROT 6.4* 5.8*  ALBUMIN  3.5 3.1*   No results for input(s): "LIPASE", "AMYLASE" in the last 168 hours. Recent Labs  Lab 12/18/23 1535  AMMONIA <13   Coagulation Profile: No results for input(s): "INR", "PROTIME" in the last 168 hours. Cardiac Enzymes: No results for input(s): "CKTOTAL", "CKMB", "CKMBINDEX", "TROPONINI" in the last 168 hours. BNP (last 3 results) No results for input(s): "PROBNP" in the last 8760 hours. HbA1C: No results for input(s): "HGBA1C" in the last 72 hours. CBG: Recent Labs  Lab 12/22/23 2031  GLUCAP 121*  Lipid Profile: No results for input(s): "CHOL", "HDL", "LDLCALC", "TRIG", "CHOLHDL", "LDLDIRECT" in the last 72 hours. Thyroid  Function Tests: No results for input(s): "TSH", "T4TOTAL", "FREET4", "T3FREE", "THYROIDAB" in the last 72 hours. Anemia Panel: No results for input(s): "VITAMINB12", "FOLATE", "FERRITIN", "TIBC", "IRON", "RETICCTPCT" in the last 72 hours. Sepsis Labs: No results for input(s): "PROCALCITON", "LATICACIDVEN" in the last 168 hours.  Recent Results (from the past 240 hours)  Surgical PCR screen     Status: None   Collection Time: 12/21/23  2:00 AM   Specimen: Nasal Mucosa; Nasal Swab  Result Value Ref Range Status   MRSA, PCR NEGATIVE NEGATIVE Final   Staphylococcus aureus NEGATIVE NEGATIVE Final    Comment: (NOTE) The Xpert SA Assay (FDA approved for NASAL specimens in patients 57 years of age and older), is one component of a comprehensive surveillance program. It is not intended to diagnose infection nor to guide or monitor treatment. Performed at [facility], [address] [place], [place] [address]          Radiology Studies: DG HIP UNILAT  W OR W/O PELVIS 2-3 VIEWS RIGHT Result Date: 12/21/2023 CLINICAL DATA:  Postop EXAM: DG HIP (WITH OR WITHOUT PELVIS) 2-3V RIGHT COMPARISON:  12/20/2023 FINDINGS: Interval right hip replacement with normal alignment. Gas in the soft tissues consistent with recent surgery. IMPRESSION: Status post right hip replacement with expected postsurgical change. Electronically Signed   By: Esmeralda Hedge M.D.   On: 12/21/2023 18:09   DG HIP UNILAT WITH PELVIS 1V RIGHT Result Date: 12/21/2023 CLINICAL DATA:  Elective surgery EXAM: DG HIP (WITH OR WITHOUT PELVIS) 1V RIGHT COMPARISON:  12/20/2023 FINDINGS: Multiple low resolution intraoperative spot views of the right hip. Total fluoroscopy time was 43.3 seconds, fluoroscopic dose of 4.6 mGy. Images were obtained during a right hip replacement. IMPRESSION: Intraoperative fluoroscopic assistance provided during right hip replacement. Electronically Signed   By: Esmeralda Hedge M.D.   On: 12/21/2023 18:09   DG C-Arm 1-60 Min-No Report Result Date: 12/21/2023 Fluoroscopy was utilized by the requesting physician.  No radiographic interpretation.   DG C-Arm 1-60 Min-No Report Result Date: 12/21/2023 Fluoroscopy was utilized by the requesting physician.  No radiographic interpretation.        Scheduled Meds:  aspirin  EC  81 mg Oral Q breakfast   clopidogrel   75 mg Oral Daily   dorzolamide -timolol   1 drop Left Eye Q12H   feeding supplement  237 mL Oral BID BM   latanoprost   1 drop Left Eye QHS   levothyroxine   75 mcg Oral Q0600   memantine   5 mg Oral QHS   multivitamin with minerals  1 tablet Oral QPC lunch   mupirocin  ointment  1 Application Nasal BID   prednisoLONE  acetate  1 drop Right Eye q AM   rosuvastatin   5 mg Oral QPC lunch   sodium chloride  flush  3-10 mL Intravenous Q12H     LOS: 2 days    Time spent: 55 minutes    Staci Carver Loran Rock, DO [facility]  If 7PM-7AM, please contact night-coverage [email or web address] 12/23/2023, 10:37 AM

## 2023-12-23 NOTE — Progress Notes (Cosign Needed Addendum)
 Orthopaedic Trauma Service Progress Note  Patient ID: [name] MRN: [id number] DOB/AGE: 83/03/1941 83 y.o.  Subjective:  Family reports pt doing a little better today   Review of Systems  Unable to perform ROS: Dementia    Today's  total administered Morphine  Milligram Equivalents: 0 Yesterday's total administered Morphine  Milligram Equivalents: 0  Objective:   VITALS:   Vitals:   12/22/23 2017 12/23/23 0021 12/23/23 0415 12/23/23 0817  BP: 131/61 (!) 122/53 (!) 134/56 (!) 139/58  Pulse: 100 89 99 (!) 108  Resp: 16 16 16    Temp: 98.6 F (37 C) 99 F (37.2 C) 98.5 F (36.9 C)   TempSrc: Oral Oral Oral   SpO2: 96% 92% 95% 96%  Weight:      Height:        Estimated body mass index is 21.39 kg/m as calculated from the following:   Height as of this encounter: 5\' 6"  (1.676 m).   Weight as of this encounter: 60.1 kg.   Intake/Output      05/24 0701 05/25 0700 05/25 0701 05/26 0700   P.O. 627 150   I.V. (mL/kg) 220 (3.7)    IV Piggyback     Total Intake(mL/kg) 847 (14.1) 150 (2.5)   Urine (mL/kg/hr) 1150 (0.8)    Blood     Total Output 1150    Net -303 +150          LABS  Results for orders placed or performed during the hospital encounter of 12/18/23 (from the past 24 hours)  Glucose, capillary     Status: Abnormal   Collection Time: 12/22/23  8:31 PM  Result Value Ref Range   Glucose-Capillary 121 (H) 70 - 99 mg/dL  CBC     Status: Abnormal   Collection Time: 12/23/23  7:01 AM  Result Value Ref Range   WBC 8.1 4.0 - 10.5 K/uL   RBC 2.82 (L) 3.87 - 5.11 MIL/uL   Hemoglobin 8.7 (L) 12.0 - 15.0 g/dL   HCT 40.9 (L) 81.1 - 91.4 %   MCV 90.1 80.0 - 100.0 fL   MCH 30.9 26.0 - 34.0 pg   MCHC 34.3 30.0 - 36.0 g/dL   RDW 78.2 95.6 - 21.3 %   Platelets 208 150 - 400 K/uL   nRBC 0.0 0.0 - 0.2 %  Basic metabolic panel with GFR     Status: Abnormal   Collection Time: 12/23/23  7:01  AM  Result Value Ref Range   Sodium 132 (L) 135 - 145 mmol/L   Potassium 3.7 3.5 - 5.1 mmol/L   Chloride 102 98 - 111 mmol/L   CO2 24 22 - 32 mmol/L   Glucose, Bld 103 (H) 70 - 99 mg/dL   BUN 20 8 - 23 mg/dL   Creatinine, Ser 0.86 0.44 - 1.00 mg/dL   Calcium  8.3 (L) 8.9 - 10.3 mg/dL   GFR, Estimated >57 >84 mL/min   Anion gap 6 5 - 15  Magnesium      Status: None   Collection Time: 12/23/23  7:01 AM  Result Value Ref Range   Magnesium  2.2 1.7 - 2.4 mg/dL     PHYSICAL EXAM:   Gen: resting comfortably in bed, NAD  Lungs: unlabored Ext:       Right Lower Extremity Dressing is clean, dry  and intact  Extremity is warm  No DCT  DPN, SPN, TN sensory functions are intact  EHL, FHL, lesser toe motor functions intact  Ankle flexion, extension, inversion eversion intact  + DP pulse   Assessment/Plan: 2 Days Post-Op   Principal Problem:   Closed subcapital fracture of neck of right femur, initial encounter (HCC) Active Problems:   Cerebrovascular accident (CVA) (HCC)   Preop cardiovascular exam   Anti-infectives (From admission, onward)    Start     Dose/Rate Route Frequency Ordered Stop   12/21/23 1800  ceFAZolin  (ANCEF ) IVPB 2g/100 mL premix        2 g 200 mL/hr over 30 Minutes Intravenous Every 6 hours 12/21/23 1540 12/22/23 0102   12/21/23 1401  vancomycin (VANCOCIN) powder  Status:  Discontinued          As needed 12/21/23 1402 12/21/23 1437   12/21/23 1053  ceFAZolin  (ANCEF ) 2-4 GM/100ML-% IVPB       Note to Pharmacy: Devoria Font O: cabinet override      12/21/23 1053 12/21/23 1248   12/21/23 1045  ceFAZolin  (ANCEF ) IVPB 2g/100 mL premix        2 g 200 mL/hr over 30 Minutes Intravenous On call to O.R. 12/21/23 1037 12/21/23 1217     .  POD/HD#: 79  83 year old female ground-level fall with right femoral neck fracture status post right total hip arthroplasty   Weightbearing: WBAT RLE ROM: No hip range of motion restrictions Insicional and dressing care:  Dressing changes as needed Pain management: Multimodal ID: Perioperative antibiotics completed  Orthopedic device(s): Walker  Showering: Okay to shower  VTE prophylaxis: ASA  Dispo: Continue with therapies, SNF  Follow - up plan: 2 weeks with Dr. Lessie Ratel, PA-C [phone] (C) 12/23/2023, 11:02 AM  [facility] [address] [place] [address] [phone] Deanna [phone] (F)    After 5pm and on the weekends please log on to Amion, go to orthopaedics and the look under the [facility] Call for the provider(s) on call. You can also call our office at [phone] and then follow the prompts to be connected to the call team.  Patient ID: [name], female   DOB: 04/03/1941, 83 y.o.   MRN: [id number]

## 2023-12-23 NOTE — TOC Progression Note (Signed)
 Transition of Care [facility]) - Progression Note    Patient Details  Name: [name] MRN: [id number] Date of Birth: [birth year]  Transition of Care [facility]) CM/SW Contact  Laken Lobato A [place], LCSW Phone Number: 12/23/2023, 10:27 AM  Clinical Narrative:     Pt therapy recommendation for short term rehab at SNF. Per last TOC note, pt and family agreeable for pt to go to rehab at discharge. Pt faxed out for SNF referral. Bed offers pending.   PASSR added to FL2.   TOC will continue to follow.   Expected Discharge Plan: Skilled Nursing Facility Barriers to Discharge: Continued Medical Work up  Expected Discharge Plan and Services In-house Referral: Clinical Social Work Discharge Planning Services: CM Consult Post Acute Care Choice: Skilled Nursing Facility Living arrangements for the past 2 months: Single Family Home                                       Social Determinants of Health (SDOH) Interventions SDOH Screenings   Food Insecurity: No Food Insecurity (12/18/2023)  Housing: Low Risk  (12/18/2023)  Transportation Needs: No Transportation Needs (12/18/2023)  Utilities: Not At Risk (12/18/2023)  Social Connections: Socially Isolated (12/18/2023)  Tobacco Use: Medium Risk (12/21/2023)    Readmission Risk Interventions    11/21/2023   10:23 AM  Readmission Risk Prevention Plan  Post Dischage Appt Complete  Medication Screening Complete  Transportation Screening Complete

## 2023-12-23 NOTE — Progress Notes (Signed)
 Pt's mews yellow d/t hr . HR has been in the 110s-120s , not new for pt . Pt denies any chest pain or sob.Provider on call notified see new order.   12/23/23 2011  Vitals  Temp 99.4 F (37.4 C)  Temp Source Oral  BP 137/77  MAP (mmHg) 93  BP Location Right Arm  BP Method Automatic  Patient Position (if appropriate) Lying  Pulse Rate (!) 118  Pulse Rate Source Monitor  Resp 18  Level of Consciousness  Level of Consciousness Alert  MEWS COLOR  MEWS Score Color Yellow  Oxygen Therapy  SpO2 97 %  O2 Device Room Air  MEWS Score  MEWS Temp 0  MEWS Systolic 0  MEWS Pulse 2  MEWS RR 0  MEWS LOC 0  MEWS Score 2  Provider Notification  Provider Name/Title Sundil MD  Date Provider Notified 12/23/23  Time Provider Notified 2033  Method of Notification Page  Notification Reason Other (Comment) (Yellow mews due to HR , pt ST 120s, pt asymptomatic. Per cardiology note aware of the above and have signed off .)  Provider response See new orders;Evaluate remotely  Date of Provider Response 12/23/23  Time of Provider Response 2045  Rapid Response Notification  Name of Rapid Response RN Notified Mindy RN  Date Rapid Response Notified 12/23/23  Time Rapid Response Notified 2045

## 2023-12-23 NOTE — Progress Notes (Signed)
 Rounding Note    Patient Name: [name] Date of Encounter: 12/23/2023  [facility] Cardiologist: Antoinette Batman, MD   Subjective   BP 140/51.  Creatinine 0.86, hemoglobin 8.7. She is oriented x2.  Denies chest pain or dyspnea  Inpatient Medications    Scheduled Meds:  aspirin  EC  81 mg Oral Q breakfast   clopidogrel  75 mg Oral Daily   dorzolamide -timolol   1 drop Left Eye Q12H   feeding supplement  237 mL Oral BID BM   latanoprost   1 drop Left Eye QHS   levothyroxine   75 mcg Oral Q0600   memantine   5 mg Oral QHS   multivitamin with minerals  1 tablet Oral QPC lunch   mupirocin ointment  1 Application Nasal BID   prednisoLONE  acetate  1 drop Right Eye q AM   rosuvastatin   5 mg Oral QPC lunch   sodium chloride  flush  3-10 mL Intravenous Q12H   Continuous Infusions:  sodium chloride  75 mL/hr at 12/23/23 1111   PRN Meds: acetaminophen , haloperidol lactate, hydrALAZINE , HYDROcodone-acetaminophen , HYDROcodone-acetaminophen , labetalol , lip balm, melatonin, menthol-cetylpyridinium **OR** phenol, morphine  injection, polyvinyl alcohol, prochlorperazine, senna-docusate, sodium chloride  flush   Vital Signs    Vitals:   12/23/23 0021 12/23/23 0415 12/23/23 0817 12/23/23 1138  BP: (!) 122/53 (!) 134/56 (!) 139/58 (!) 140/51  Pulse: 89 99 (!) 108 100  Resp: 16 16  19   Temp: 99 F (37.2 C) 98.5 F (36.9 C)  98.2 F (36.8 C)  TempSrc: Oral Oral  Oral  SpO2: 92% 95% 96% 98%  Weight:      Height:        Intake/Output Summary (Last 24 hours) at 12/23/2023 1348 Last data filed at 12/23/2023 0900 Gross per 24 hour  Intake 640 ml  Output 900 ml  Net -260 ml      12/21/2023   10:38 AM 12/18/2023    3:44 PM 11/29/2023    3:45 PM  Last 3 Weights  Weight (lbs) 132 lb 7.9 oz 132 lb 7.9 oz 132 lb 9.6 oz  Weight (kg) 60.1 kg 60.1 kg 60.147 kg      Telemetry    Sinus tachycardia in 100s - Personally Reviewed  ECG    No new ECG - Personally  Reviewed  Physical Exam   GEN: No acute distress.   Neck: No JVD Cardiac: tachycardic, regular, no murmurs, rubs, or gallops.  Respiratory: Clear to auscultation bilaterally. GI: Soft, nontender, non-distended  MS: No edema; No deformity. Neuro:  Oriented to person only  Labs    High Sensitivity Troponin:  No results for input(s): "TROPONINIHS" in the last 720 hours.   Chemistry Recent Labs  Lab 12/18/23 1535 12/19/23 0422 12/20/23 0958 12/21/23 0651 12/22/23 0743 12/23/23 0701  NA 135 135   < > 129* 130* 132*  K 3.8 3.8   < > 3.9 4.0 3.7  CL 103 103   < > 96* 99 102  CO2 21* 21*   < > 19* 21* 24  GLUCOSE 85 76   < > 112* 106* 103*  BUN 20 15   < > 16 25* 20  CREATININE 1.05* 0.85   < > 0.93 1.17* 0.86  CALCIUM  9.4 9.0   < > 9.2 8.4* 8.3*  MG 2.1 2.0  --   --   --  2.2  PROT 6.4*  --   --   --  5.8*  --   ALBUMIN  3.5  --   --   --  3.1*  --   AST 28  --   --   --  31  --   ALT 17  --   --   --  14  --   ALKPHOS 51  --   --   --  38  --   BILITOT 0.9  --   --   --  0.6  --   GFRNONAA 53* >60   < > >60 47* >60  ANIONGAP 11 11   < > 14 10 6    < > = values in this interval not displayed.    Lipids  Recent Labs  Lab 12/19/23 0422  CHOL 131  TRIG 46  HDL 77  LDLCALC 45  CHOLHDL 1.7    Hematology Recent Labs  Lab 12/21/23 0651 12/22/23 0743 12/23/23 0701  WBC 10.7* 9.9 8.1  RBC 4.65 3.06* 2.82*  HGB 14.2 9.4* 8.7*  HCT 41.6 27.6* 25.4*  MCV 89.5 90.2 90.1  MCH 30.5 30.7 30.9  MCHC 34.1 34.1 34.3  RDW 13.3 13.2 13.2  PLT 201 191 208   Thyroid  No results for input(s): "TSH", "FREET4" in the last 168 hours.  BNPNo results for input(s): "BNP", "PROBNP" in the last 168 hours.  DDimer No results for input(s): "DDIMER" in the last 168 hours.   Radiology    DG HIP UNILAT W OR W/O PELVIS 2-3 VIEWS RIGHT Result Date: 12/21/2023 CLINICAL DATA:  Postop EXAM: DG HIP (WITH OR WITHOUT PELVIS) 2-3V RIGHT COMPARISON:  12/20/2023 FINDINGS: Interval right hip  replacement with normal alignment. Gas in the soft tissues consistent with recent surgery. IMPRESSION: Status post right hip replacement with expected postsurgical change. Electronically Signed   By: Esmeralda Hedge M.D.   On: 12/21/2023 18:09   DG HIP UNILAT WITH PELVIS 1V RIGHT Result Date: 12/21/2023 CLINICAL DATA:  Elective surgery EXAM: DG HIP (WITH OR WITHOUT PELVIS) 1V RIGHT COMPARISON:  12/20/2023 FINDINGS: Multiple low resolution intraoperative spot views of the right hip. Total fluoroscopy time was 43.3 seconds, fluoroscopic dose of 4.6 mGy. Images were obtained during a right hip replacement. IMPRESSION: Intraoperative fluoroscopic assistance provided during right hip replacement. Electronically Signed   By: Esmeralda Hedge M.D.   On: 12/21/2023 18:09   DG C-Arm 1-60 Min-No Report Result Date: 12/21/2023 Fluoroscopy was utilized by the requesting physician.  No radiographic interpretation.   DG C-Arm 1-60 Min-No Report Result Date: 12/21/2023 Fluoroscopy was utilized by the requesting physician.  No radiographic interpretation.    Cardiac Studies     Patient Profile     83 y.o. female with a history of severe aortic stenosis status post TAVR 10/2023, nonobstructive CAD, COPD, hypertension, hyperlipidemia, hypothyroidism, dementia consulted for preoperative evaluation   Assessment & Plan    Status post recent TAVR 10/2023 Stable function on most recent echocardiogram.  She was on aspirin  81 mg daily after her TAVR but Plavix added after acute CVA.  Both were held after hip fracture but now back on DAPT   Acute CVA MRI revealing small left frontal punctate infarct.  Managed by neurology.  Felt to be cardioembolic.  30-day heart monitor already ordered.  No evidence of atrial fibrillation here.  Potentially could be secondary to valve.  Neurology recommended aspirin  plus Plavix x 3 weeks then Plavix alone   Hypertension Has been normotensive off medication  Right hip fracture Had  fall in hospital on 5/21, suffered hip fracture.  Underwent surgery on 5/23  [facility] will sign off.  Medication Recommendations: No changes Other recommendations (labs, testing, etc): None Follow up as an outpatient: Will schedule     For questions or updates, please contact [facility] Please consult [email or web address] for contact info under        Signed, Wendie Hamburg, MD  12/23/2023, 1:48 PM

## 2023-12-23 NOTE — Plan of Care (Signed)
  Problem: Ischemic Stroke/TIA Tissue Perfusion: Goal: Complications of ischemic stroke/TIA will be minimized Outcome: Progressing   Problem: Coping: Goal: Will identify appropriate support needs Outcome: Progressing   Problem: Self-Care: Goal: Ability to communicate needs accurately will improve Outcome: Progressing   Problem: Nutrition: Goal: Risk of aspiration will decrease Outcome: Progressing Goal: Dietary intake will improve Outcome: Progressing

## 2023-12-23 NOTE — Progress Notes (Signed)
 Sinus tachycardia Reported that overnight patient heart rate was up to 120.  Ordered IV metoprolol as needed for persistent tachycardia heart rate above 120.

## 2023-12-23 NOTE — NC FL2 (Signed)
 [place]  MEDICAID FL2 LEVEL OF CARE FORM     IDENTIFICATION  Patient Name: [name] Birthdate: [birth year] Sex: female Admission Date (Current Location): 12/18/2023  [facility] and [place] Number:  Producer, television/film/video and Address:  [facility] [place], [address] [place], [place] [address]      Provider Number: [id number]  Attending Physician Name and Address:  Cornelius Dill, DO  Relative Name and Phone Number:  [facility] Daughter [phone]    Current Level of Care: Hospital Recommended Level of Care: Skilled Nursing Facility Prior Approval Number:    Date Approved/Denied:   PASRR Number: [id number]  Discharge Plan: SNF    Current Diagnoses: Patient Active Problem List   Diagnosis Date Noted   Preop cardiovascular exam 12/20/2023   Closed subcapital fracture of neck of right femur, initial encounter (HCC) 12/20/2023   Cerebrovascular accident (CVA) (HCC) 12/18/2023   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   Hypothyroid    Hypertension    Severe aortic stenosis 11/08/2023   MCI (mild cognitive impairment) 05/25/2020   COVID-19 virus infection 05/25/2020   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   Pseudophakia 11/01/2012   COPD (chronic obstructive pulmonary disease) (HCC) 07/05/2012   Multiple thyroid  nodules 01/01/2012    Orientation RESPIRATION BLADDER Height & Weight     Self  Normal Continent Weight: 132 lb 7.9 oz (60.1 kg) Height:  5\' 6"  (167.6 cm)  BEHAVIORAL SYMPTOMS/MOOD NEUROLOGICAL BOWEL NUTRITION STATUS      Continent Diet (heart healthy with thin liquids)  AMBULATORY STATUS COMMUNICATION OF NEEDS Skin   Extensive Assist Verbally Normal                       Personal Care Assistance Level of Assistance  Bathing, Feeding, Dressing Bathing  Assistance: Maximum assistance Feeding assistance: Limited assistance Dressing Assistance: Maximum assistance     Functional Limitations Info  Sight, Hearing, Speech Sight Info: Adequate Hearing Info: Adequate Speech Info: Adequate    SPECIAL CARE FACTORS FREQUENCY  PT (By licensed PT), OT (By licensed OT)     PT Frequency: 5x/wk OT Frequency: 5x/wk            Contractures Contractures Info: Not present    Additional Factors Info  Code Status, Allergies, Psychotropic Code Status Info: Full Allergies Info: Brimonidine/ Fluocinolone Acetonide/ naphazoline-polyehtyl Glycol/ Triamcinolone Acetonide/ Tropicanmide Psychotropic Info: Namenda  5 mg at bedtime         Current Medications (12/23/2023):  This is the current hospital active medication list Current Facility-Administered Medications  Medication Dose Route Frequency Provider Last Rate Last Admin   acetaminophen  (TYLENOL ) tablet 325-650 mg  325-650 mg Oral Q6H PRN McClung, Sarah A, PA-C       aspirin  EC tablet 81 mg  81 mg Oral Q breakfast Shah, Pratik D, DO   81 mg at 12/23/23 6644   clopidogrel (PLAVIX) tablet 75 mg  75 mg Oral Daily Shah, Pratik D, DO   75 mg at 12/23/23 0347   dorzolamide -timolol  (COSOPT ) 2-0.5 % ophthalmic solution 1 drop  1 drop Left Eye Q12H Versie Gores, PA-C   1 drop at 12/23/23 0825   feeding supplement (ENSURE ENLIVE / ENSURE PLUS) liquid 237 mL  237 mL Oral BID BM Shah, Pratik D, DO   237 mL at 12/23/23 1009   haloperidol lactate (HALDOL) injection 1  mg  1 mg Intravenous Q6H PRN Versie Gores, PA-C   1 mg at 12/19/23 2311   hydrALAZINE  (APRESOLINE ) injection 10 mg  10 mg Intravenous Q6H PRN Versie Gores, PA-C   10 mg at 12/21/23 0458   HYDROcodone-acetaminophen  (NORCO) 7.5-325 MG per tablet 1-2 tablet  1-2 tablet Oral Q4H PRN Versie Gores, PA-C       HYDROcodone-acetaminophen  (NORCO/VICODIN) 5-325 MG per tablet 1-2 tablet  1-2 tablet Oral Q4H PRN Versie Gores, PA-C        labetalol  (NORMODYNE ) injection 5 mg  5 mg Intravenous Q2H PRN Versie Gores, PA-C       latanoprost  (XALATAN ) 0.005 % ophthalmic solution 1 drop  1 drop Left Eye QHS Versie Gores, PA-C   1 drop at 12/22/23 2127   levothyroxine  (SYNTHROID ) tablet 75 mcg  75 mcg Oral [id number] Versie Gores, PA-C   75 mcg at 12/23/23 6045   lip balm (CARMEX) ointment   Topical PRN Versie Gores, PA-C       melatonin tablet 5 mg  5 mg Oral QHS PRN Versie Gores, PA-C   5 mg at 12/19/23 2212   memantine  (NAMENDA ) tablet 5 mg  5 mg Oral QHS Versie Gores, PA-C   5 mg at 12/22/23 2130   menthol-cetylpyridinium (CEPACOL) lozenge 3 mg  1 lozenge Oral PRN Versie Gores, PA-C       Or   phenol (CHLORASEPTIC) mouth spray 1 spray  1 spray Mouth/Throat PRN Versie Gores, PA-C       morphine  (PF) 2 MG/ML injection 0.5-1 mg  0.5-1 mg Intravenous Q2H PRN Jonelle Neri, Sarah A, PA-C       multivitamin with minerals tablet 1 tablet  1 tablet Oral QPC lunch Versie Gores, PA-C   1 tablet at 12/22/23 1325   mupirocin ointment (BACTROBAN) 2 % 1 Application  1 Application Nasal BID Versie Gores, PA-C   1 Application at 12/23/23 0826   polyvinyl alcohol (LIQUIFILM TEARS) 1.4 % ophthalmic solution 1 drop  1 drop Both Eyes PRN Versie Gores, PA-C       prednisoLONE  acetate (PRED FORTE ) 1 % ophthalmic suspension 1 drop  1 drop Right Eye q AM Versie Gores, PA-C   1 drop at 12/23/23 0821   prochlorperazine (COMPAZINE) injection 5 mg  5 mg Intravenous Q6H PRN Versie Gores, PA-C       rosuvastatin  (CRESTOR ) tablet 5 mg  5 mg Oral QPC lunch Versie Gores, PA-C   5 mg at 12/22/23 1324   senna-docusate (Senokot-S) tablet 1 tablet  1 tablet Oral QHS PRN Versie Gores, PA-C       sodium chloride  flush (NS) 0.9 % injection 3-10 mL  3-10 mL Intravenous Q12H Versie Gores, PA-C   10 mL at 12/23/23 4098   sodium chloride  flush (NS) 0.9 % injection 3-10 mL  3-10 mL Intravenous PRN Versie Gores, PA-C          Discharge Medications: Please see discharge summary for a list of discharge medications.  Relevant Imaging Results:  Relevant Lab Results:   Additional Information SS#: [id number]  Rey Dansby A [place], LCSW

## 2023-12-24 DIAGNOSIS — S72011A Unspecified intracapsular fracture of right femur, initial encounter for closed fracture: Secondary | ICD-10-CM | POA: Diagnosis not present

## 2023-12-24 LAB — TSH: TSH: 9.552 u[IU]/mL — ABNORMAL HIGH (ref 0.350–4.500)

## 2023-12-24 LAB — CBC
HCT: 24.6 % — ABNORMAL LOW (ref 36.0–46.0)
Hemoglobin: 8.3 g/dL — ABNORMAL LOW (ref 12.0–15.0)
MCH: 30 pg (ref 26.0–34.0)
MCHC: 33.7 g/dL (ref 30.0–36.0)
MCV: 88.8 fL (ref 80.0–100.0)
Platelets: 231 10*3/uL (ref 150–400)
RBC: 2.77 MIL/uL — ABNORMAL LOW (ref 3.87–5.11)
RDW: 13.1 % (ref 11.5–15.5)
WBC: 7.6 10*3/uL (ref 4.0–10.5)
nRBC: 0 % (ref 0.0–0.2)

## 2023-12-24 LAB — IRON AND TIBC
Iron: 14 ug/dL — ABNORMAL LOW (ref 28–170)
Saturation Ratios: 6 % — ABNORMAL LOW (ref 10.4–31.8)
TIBC: 232 ug/dL — ABNORMAL LOW (ref 250–450)
UIBC: 218 ug/dL

## 2023-12-24 LAB — HEMOGLOBIN AND HEMATOCRIT, BLOOD
HCT: 30.5 % — ABNORMAL LOW (ref 36.0–46.0)
Hemoglobin: 10.4 g/dL — ABNORMAL LOW (ref 12.0–15.0)

## 2023-12-24 LAB — BASIC METABOLIC PANEL WITH GFR
Anion gap: 7 (ref 5–15)
BUN: 13 mg/dL (ref 8–23)
CO2: 23 mmol/L (ref 22–32)
Calcium: 8.2 mg/dL — ABNORMAL LOW (ref 8.9–10.3)
Chloride: 100 mmol/L (ref 98–111)
Creatinine, Ser: 0.81 mg/dL (ref 0.44–1.00)
GFR, Estimated: >60 mL/min (ref >60)
Glucose, Bld: 113 mg/dL — ABNORMAL HIGH (ref 70–99)
Potassium: 3.9 mmol/L (ref 3.5–5.1)
Sodium: 130 mmol/L — ABNORMAL LOW (ref 135–145)

## 2023-12-24 LAB — FOLATE: Folate: 20 ng/mL (ref >5.9)

## 2023-12-24 LAB — FERRITIN: Ferritin: 127 ng/mL (ref 11–307)

## 2023-12-24 LAB — RETICULOCYTES
Immature Retic Fract: 12.5 % (ref 2.3–15.9)
RBC.: 2.81 MIL/uL — ABNORMAL LOW (ref 3.87–5.11)
Retic Count, Absolute: 69.7 10*3/uL (ref 19.0–186.0)
Retic Ct Pct: 2.5 % (ref 0.4–3.1)

## 2023-12-24 LAB — VITAMIN B12: Vitamin B-12: 1348 pg/mL — ABNORMAL HIGH (ref 180–914)

## 2023-12-24 LAB — MAGNESIUM: Magnesium: 2 mg/dL (ref 1.7–2.4)

## 2023-12-24 LAB — T4, FREE: Free T4: 1.23 ng/dL — ABNORMAL HIGH (ref 0.61–1.12)

## 2023-12-24 LAB — PREPARE RBC (CROSSMATCH)

## 2023-12-24 MED ORDER — SODIUM CHLORIDE 0.9% IV SOLUTION: Freq: Once | INTRAVENOUS | Status: AC

## 2023-12-24 MED ORDER — METOPROLOL SUCCINATE ER 100 MG PO TB24
100.0000 mg | ORAL_TABLET | Freq: Every day | ORAL | Status: DC
  Administered 2023-12-24 – 2023-12-27 (×4): 100 mg via ORAL
  Filled 2023-12-24 (×4): qty 1

## 2023-12-24 NOTE — TOC Progression Note (Signed)
 Transition of Care [facility]) - Progression Note    Patient Details  Name: [name] MRN: [id number] Date of Birth: 1941-01-27  Transition of Care [facility]) CM/SW Contact  Kavita Bartl A [place], LCSW Phone Number: 12/24/2023, 2:44 PM  Clinical Narrative:     CSW met with pt's daughter [name] and provided bed offers. She said she would speak with her family and follow up with CSW on decision, but so far, leaning towards choice for [facility] SNF.   Also requested CSW reach out to CIR for review for admissions, CSW requested, waiting to hear back.    TOC will continue to follow.   Expected Discharge Plan: Skilled Nursing Facility Barriers to Discharge: Continued Medical Work up  Expected Discharge Plan and Services In-house Referral: Clinical Social Work Discharge Planning Services: CM Consult Post Acute Care Choice: Skilled Nursing Facility Living arrangements for the past 2 months: Single Family Home                                       Social Determinants of Health (SDOH) Interventions SDOH Screenings   Food Insecurity: No Food Insecurity (12/18/2023)  Housing: Low Risk  (12/18/2023)  Transportation Needs: No Transportation Needs (12/18/2023)  Utilities: Not At Risk (12/18/2023)  Social Connections: Socially Isolated (12/18/2023)  Tobacco Use: Medium Risk (12/21/2023)    Readmission Risk Interventions    11/21/2023   10:23 AM  Readmission Risk Prevention Plan  Post Dischage Appt Complete  Medication Screening Complete  Transportation Screening Complete

## 2023-12-24 NOTE — Progress Notes (Signed)
 PROGRESS NOTE    [name]  ZOX:[id number] DOB: April 25, 1941 DOA: 12/18/2023 PCP: Imelda Man, MD   Brief Narrative:    [name] is a 83 y.o. 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  Associated with unsteady gait, which is not her baseline.  Family thought that she was having a urinary tract infection or dehydration and finally decided to bring her to the ER for further evaluation.  She was noted to have acute ischemic CVA and underwent evaluation per neurology with recommendations for DAPT for 3 weeks and then Plavix alone along with 30-day heart monitor after discharge.  Was noted to have a fall out of bed 5/20 of the right femoral neck and underwent operative repair 5/23 for this.  PT/OT evaluation recommending SNF.  She is noted to have some significant postoperative anemia and will require 1 unit PRBC transfusion today and was also noted to have some tachycardia overnight.  Anticipate discharge in the next 1-2 days when SNF bed availability further stabilized.  Assessment & Plan:   Principal Problem:   Closed subcapital fracture of neck of right femur, initial encounter (HCC) Active Problems:   Cerebrovascular accident (CVA) (HCC)   Preop cardiovascular exam  Assessment and Plan:  Acute CVA, POA Brain MRI revealed small left high frontal punctate stroke (recent TAVR procedure) CT angio head and neck with no emergent finding although suboptimal CT due to bolus timing Echo showed EF of 65 to 70%, no regional wall motion abnormality, grade 1 diastolic dysfunction, no atrial level shunt detected LDL 45, A1c 5 Permissive hypertension Neurology/stroke team recommend aspirin  and clopidogrel for 3 weeks, then clopidogrel alone.  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 Cardiology following Orthopedic surgery continues to follow  Hyponatremia-stable Hold further IV fluid and continue to monitor in a.m.  Postoperative anemia-worsening Plan for 1 unit PRBC transfusion 5/26 given excessive weakness/fatigue and downward trend in hemoglobin along with tachycardia Continue to monitor CBC Check anemia panel Recheck hemoglobin and hematocrit post transfusion May need to consider holding Plavix if continued downward trend is noted, but currently without any overt bleeding identified   Vascular dementia Continue Namenda  Reorient as needed Fall precautions   Hypothyroidism Resume home levothyroxine  TSH 9.552 with T4 1.23   Hyperlipidemia Resume home Crestor    Hypertension Allow for permissive hypertension   DVT prophylaxis: SCDs Code Status: Full Family Communication: Grandson at bedside 5/26 Disposition Plan: SNF placement pending Status is: Inpatient Remains inpatient appropriate because: Need for IV medications and further evaluation with likely need for placement.   Consultants:  Neurology Cardiology Orthopedics  Procedures:  Right total hip arthroplasty  12/21/23  Antimicrobials:  Anti-infectives (From admission, onward)    Start     Dose/Rate Route Frequency Ordered Stop   12/21/23 1800  ceFAZolin  (ANCEF ) IVPB 2g/100 mL premix        2 g 200 mL/hr over 30 Minutes Intravenous Every 6 hours 12/21/23 1540 12/22/23 0102   12/21/23 1401  vancomycin (VANCOCIN) powder  Status:  Discontinued          As needed 12/21/23 1402 12/21/23 1437   12/21/23 1053  ceFAZolin  (ANCEF ) 2-4 GM/100ML-% IVPB       Note to Pharmacy: Devoria Font O: cabinet override      12/21/23 1053 12/21/23 1248   12/21/23 1045  ceFAZolin  (ANCEF ) IVPB 2g/100 mL premix        2 g 200 mL/hr over 30 Minutes Intravenous On call to O.R. 12/21/23 1037 12/21/23 1217       Subjective: Patient seen and evaluated today with no new acute complaints or concerns. Denies significant L hip pain.  She was noted to have some significant tachycardia issues overnight requiring IV metoprolol.  She appears more tired and fatigued today and is not eating very much.  Objective: Vitals:   12/23/23 2321 12/24/23 0441 12/24/23 0720 12/24/23 1144  BP: (!) 161/75 (!) 155/68 (!) 152/68 (!) 124/59  Pulse: (!) 108 (!) 108 94 91  Resp: 18 18 18 18   Temp: 98.6 F (37 C) 97.8 F (36.6 C) 98.3 F (36.8 C) 98.3 F (36.8 C)  TempSrc: Oral Oral    SpO2: 95% 97% 96% 97%  Weight:      Height:        Intake/Output Summary (Last 24 hours) at 12/24/2023 1155 Last data filed at 12/24/2023 0310 Gross per 24 hour  Intake 1050.82 ml  Output 450 ml  Net 600.82 ml   Filed Weights   12/18/23 1544 12/21/23 1038  Weight: 60.1 kg 60.1 kg    Examination:  General exam: Appears calm and comfortable  Respiratory system: Clear to auscultation. Respiratory effort normal. Cardiovascular system: S1 & S2 heard, RRR.  Gastrointestinal system: Abdomen is soft Central nervous system: Alert and awake Extremities: No edema Skin: No significant lesions noted; L hip incision C/D/I Psychiatry: Flat  affect.    Data Reviewed: I have personally reviewed following labs and imaging studies  CBC: Recent Labs  Lab 12/18/23 1535 12/19/23 0422 12/20/23 0958 12/21/23 0651 12/22/23 0743 12/23/23 0701 12/24/23 0609  WBC 9.7   < > 9.2 10.7* 9.9 8.1 7.6  NEUTROABS 7.6  --   --  8.9*  --   --   --   HGB 11.9*   < > 12.4 14.2 9.4* 8.7* 8.3*  HCT 36.1   < > 36.4 41.6 27.6* 25.4* 24.6*  MCV 92.6   < > 88.3 89.5 90.2 90.1 88.8  PLT 209   < > 196 201 191 208 231   < > = values in this interval not displayed.   Basic Metabolic Panel: Recent Labs  Lab 12/18/23 1535 12/19/23 0422 12/20/23 0958 12/21/23 0651 12/22/23 0743 12/23/23 0701 12/24/23 0609  NA 135 135 131* 129* 130* 132* 130*  K 3.8 3.8 3.5 3.9 4.0 3.7 3.9  CL 103 103 100 96* 99 102 100  CO2 21* 21* 22 19* 21* 24 23  GLUCOSE 85 76 133* 112* 106* 103* 113*  BUN 20 15 11 16  25* 20 13  CREATININE 1.05* 0.85 0.95 0.93 1.17* 0.86 0.81  CALCIUM  9.4 9.0 9.1 9.2 8.4*  8.3* 8.2*  MG 2.1 2.0  --   --   --  2.2 2.0  PHOS  --  2.4*  --   --   --   --   --    GFR: Estimated Creatinine Clearance: 50.1 mL/min (by C-G formula based on SCr of 0.81 mg/dL). Liver Function Tests: Recent Labs  Lab 12/18/23 1535 12/22/23 0743  AST 28 31  ALT 17 14  ALKPHOS 51 38  BILITOT 0.9 0.6  PROT 6.4* 5.8*  ALBUMIN  3.5 3.1*   No results for input(s): "LIPASE", "AMYLASE" in the last 168 hours. Recent Labs  Lab 12/18/23 1535  AMMONIA <13   Coagulation Profile: No results for input(s): "INR", "PROTIME" in the last 168 hours. Cardiac Enzymes: No results for input(s): "CKTOTAL", "CKMB", "CKMBINDEX", "TROPONINI" in the last 168 hours. BNP (last 3 results) No results for input(s): "PROBNP" in the last 8760 hours. HbA1C: No results for input(s): "HGBA1C" in the last 72 hours. CBG: Recent Labs  Lab 12/22/23 2031  GLUCAP 121*   Lipid Profile: No results for input(s): "CHOL", "HDL", "LDLCALC", "TRIG", "CHOLHDL", "LDLDIRECT" in the last 72  hours. Thyroid  Function Tests: Recent Labs    12/24/23 0852  TSH 9.552*  FREET4 1.23*   Anemia Panel: No results for input(s): "VITAMINB12", "FOLATE", "FERRITIN", "TIBC", "IRON", "RETICCTPCT" in the last 72 hours. Sepsis Labs: No results for input(s): "PROCALCITON", "LATICACIDVEN" in the last 168 hours.  Recent Results (from the past 240 hours)  Surgical PCR screen     Status: None   Collection Time: 12/21/23  2:00 AM   Specimen: Nasal Mucosa; Nasal Swab  Result Value Ref Range Status   MRSA, PCR NEGATIVE NEGATIVE Final   Staphylococcus aureus NEGATIVE NEGATIVE Final    Comment: (NOTE) The Xpert SA Assay (FDA approved for NASAL specimens in patients 71 years of age and older), is one component of a comprehensive surveillance program. It is not intended to diagnose infection nor to guide or monitor treatment. Performed at [facility], [address], [place], [place] [address]          Radiology Studies: No results found.       Scheduled Meds:  sodium chloride    Intravenous Once   aspirin  EC  81 mg Oral Q breakfast   clopidogrel  75 mg Oral Daily   dorzolamide -timolol   1 drop Left Eye Q12H   feeding supplement  237 mL Oral BID BM   latanoprost   1 drop Left Eye QHS   levothyroxine   75 mcg Oral Q0600   memantine   5 mg Oral QHS   metoprolol succinate  100 mg Oral QPC lunch   multivitamin with minerals  1 tablet Oral QPC lunch   mupirocin ointment  1 Application Nasal BID   prednisoLONE  acetate  1 drop Right Eye q AM   rosuvastatin   5 mg Oral QPC lunch   sodium chloride  flush  3-10 mL Intravenous Q12H     LOS: 3 days    Time spent: 55 minutes    Baudelia Schroepfer Loran Rock, DO [facility]  If 7PM-7AM, please contact night-coverage [email or web address] 12/24/2023, 11:55 AM

## 2023-12-24 NOTE — Progress Notes (Signed)

## 2023-12-24 NOTE — Plan of Care (Signed)
  Problem: Education: Goal: Knowledge of disease or condition will improve Outcome: Progressing Goal: Knowledge of secondary prevention will improve (MUST DOCUMENT ALL) Outcome: Progressing Goal: Knowledge of patient specific risk factors will improve (DELETE if not current risk factor) Outcome: Progressing   Problem: Ischemic Stroke/TIA Tissue Perfusion: Goal: Complications of ischemic stroke/TIA will be minimized Outcome: Progressing   Problem: Coping: Goal: Will verbalize positive feelings about self Outcome: Progressing Goal: Will identify appropriate support needs Outcome: Progressing   Problem: Health Behavior/Discharge Planning: Goal: Ability to manage health-related needs will improve Outcome: Progressing Goal: Goals will be collaboratively established with patient/family Outcome: Progressing   Problem: Self-Care: Goal: Ability to participate in self-care as condition permits will improve Outcome: Progressing Goal: Verbalization of feelings and concerns over difficulty with self-care will improve Outcome: Progressing Goal: Ability to communicate needs accurately will improve Outcome: Progressing   Problem: Nutrition: Goal: Risk of aspiration will decrease Outcome: Progressing Goal: Dietary intake will improve Outcome: Progressing   Problem: Education: Goal: Knowledge of General Education information will improve Description: Including pain rating scale, medication(s)/side effects and non-pharmacologic comfort measures Outcome: Progressing   Problem: Health Behavior/Discharge Planning: Goal: Ability to manage health-related needs will improve Outcome: Progressing   Problem: Clinical Measurements: Goal: Ability to maintain clinical measurements within normal limits will improve Outcome: Progressing Goal: Will remain free from infection Outcome: Progressing Goal: Diagnostic test results will improve Outcome: Progressing Goal: Respiratory complications will  improve Outcome: Progressing Goal: Cardiovascular complication will be avoided Outcome: Progressing   Problem: Activity: Goal: Risk for activity intolerance will decrease Outcome: Progressing   Problem: Nutrition: Goal: Adequate nutrition will be maintained Outcome: Progressing   Problem: Coping: Goal: Level of anxiety will decrease Outcome: Progressing   Problem: Elimination: Goal: Will not experience complications related to bowel motility Outcome: Progressing Goal: Will not experience complications related to urinary retention Outcome: Progressing   Problem: Pain Managment: Goal: General experience of comfort will improve and/or be controlled Outcome: Progressing   Problem: Safety: Goal: Ability to remain free from injury will improve Outcome: Progressing   Problem: Skin Integrity: Goal: Risk for impaired skin integrity will decrease Outcome: Progressing   Problem: Education: Goal: Knowledge of the prescribed therapeutic regimen will improve Outcome: Progressing Goal: Understanding of discharge needs will improve Outcome: Progressing Goal: Individualized Educational Video(s) Outcome: Progressing   Problem: Activity: Goal: Ability to avoid complications of mobility impairment will improve Outcome: Progressing Goal: Ability to tolerate increased activity will improve Outcome: Progressing   Problem: Clinical Measurements: Goal: Postoperative complications will be avoided or minimized Outcome: Progressing   Problem: Pain Management: Goal: Pain level will decrease with appropriate interventions Outcome: Progressing   Problem: Skin Integrity: Goal: Will show signs of wound healing Outcome: Progressing

## 2023-12-24 NOTE — Plan of Care (Signed)
  Problem: Ischemic Stroke/TIA Tissue Perfusion: Goal: Complications of ischemic stroke/TIA will be minimized 12/24/2023 1725 by Jaxzen Vanhorn, RN Outcome: Progressing 12/24/2023 1722 by Bram Hottel, RN Outcome: Progressing   Problem: Coping: Goal: Will identify appropriate support needs 12/24/2023 1725 by Tyrique Sporn, RN Outcome: Progressing 12/24/2023 1722 by Evaan Tidwell, RN Outcome: Progressing   Problem: Health Behavior/Discharge Planning: Goal: Goals will be collaboratively established with patient/family Outcome: Progressing   Problem: Self-Care: Goal: Ability to communicate needs accurately will improve 12/24/2023 1725 by Shresta Risden, RN Outcome: Progressing 12/24/2023 1722 by Adil Tugwell, RN Outcome: Progressing   Problem: Nutrition: Goal: Risk of aspiration will decrease 12/24/2023 1725 by Kalyan Barabas, RN Outcome: Progressing 12/24/2023 1722 by Olen Eaves, RN Outcome: Progressing Goal: Dietary intake will improve 12/24/2023 1725 by Maleigh Bagot, RN Outcome: Progressing 12/24/2023 1722 by Amarri Satterly, RN Outcome: Progressing   Problem: Clinical Measurements: Goal: Respiratory complications will improve 12/24/2023 1725 by Egon Dittus, RN Outcome: Progressing 12/24/2023 1722 by Charmin Aguiniga, RN Outcome: Progressing Goal: Cardiovascular complication will be avoided 12/24/2023 1725 by Quanta Roher, RN Outcome: Progressing 12/24/2023 1722 by Annabelle Rexroad, RN Outcome: Progressing   Problem: Elimination: Goal: Will not experience complications related to bowel motility 12/24/2023 1725 by Caye Cocks, RN Outcome: Progressing 12/24/2023 1722 by Justyne Roell, RN Outcome: Progressing Goal: Will not experience complications related to urinary retention 12/24/2023 1725 by Caye Cocks, RN Outcome: Progressing 12/24/2023 1722 by Ramiyah Mcclenahan, RN Outcome: Progressing   Problem: Skin Integrity: Goal: Risk for impaired  skin integrity will decrease Outcome: Progressing

## 2023-12-25 ENCOUNTER — Inpatient Hospital Stay (HOSPITAL_COMMUNITY)

## 2023-12-25 ENCOUNTER — Encounter (HOSPITAL_COMMUNITY): Payer: Self-pay | Admitting: Student

## 2023-12-25 DIAGNOSIS — R933 Abnormal findings on diagnostic imaging of other parts of digestive tract: Secondary | ICD-10-CM | POA: Diagnosis not present

## 2023-12-25 DIAGNOSIS — K529 Noninfective gastroenteritis and colitis, unspecified: Secondary | ICD-10-CM

## 2023-12-25 DIAGNOSIS — I639 Cerebral infarction, unspecified: Secondary | ICD-10-CM | POA: Diagnosis not present

## 2023-12-25 DIAGNOSIS — K625 Hemorrhage of anus and rectum: Secondary | ICD-10-CM

## 2023-12-25 DIAGNOSIS — K5900 Constipation, unspecified: Secondary | ICD-10-CM | POA: Diagnosis not present

## 2023-12-25 DIAGNOSIS — S72011A Unspecified intracapsular fracture of right femur, initial encounter for closed fracture: Secondary | ICD-10-CM | POA: Diagnosis not present

## 2023-12-25 DIAGNOSIS — Z7982 Long term (current) use of aspirin: Secondary | ICD-10-CM

## 2023-12-25 DIAGNOSIS — Z7902 Long term (current) use of antithrombotics/antiplatelets: Secondary | ICD-10-CM

## 2023-12-25 LAB — CBC
HCT: 32.3 % — ABNORMAL LOW (ref 36.0–46.0)
Hemoglobin: 11.1 g/dL — ABNORMAL LOW (ref 12.0–15.0)
MCH: 30.3 pg (ref 26.0–34.0)
MCHC: 34.4 g/dL (ref 30.0–36.0)
MCV: 88.3 fL (ref 80.0–100.0)
Platelets: 276 10*3/uL (ref 150–400)
RBC: 3.66 MIL/uL — ABNORMAL LOW (ref 3.87–5.11)
RDW: 13.6 % (ref 11.5–15.5)
WBC: 8.1 10*3/uL (ref 4.0–10.5)
nRBC: 0 % (ref 0.0–0.2)

## 2023-12-25 LAB — HEMOGLOBIN AND HEMATOCRIT, BLOOD
HCT: 30.2 % — ABNORMAL LOW (ref 36.0–46.0)
Hemoglobin: 10.3 g/dL — ABNORMAL LOW (ref 12.0–15.0)

## 2023-12-25 LAB — BASIC METABOLIC PANEL WITH GFR
Anion gap: 4 — ABNORMAL LOW (ref 5–15)
BUN: 14 mg/dL (ref 8–23)
CO2: 25 mmol/L (ref 22–32)
Calcium: 8.4 mg/dL — ABNORMAL LOW (ref 8.9–10.3)
Chloride: 101 mmol/L (ref 98–111)
Creatinine, Ser: 0.82 mg/dL (ref 0.44–1.00)
GFR, Estimated: >60 mL/min (ref >60)
Glucose, Bld: 111 mg/dL — ABNORMAL HIGH (ref 70–99)
Potassium: 4 mmol/L (ref 3.5–5.1)
Sodium: 130 mmol/L — ABNORMAL LOW (ref 135–145)

## 2023-12-25 LAB — PREPARE RBC (CROSSMATCH)

## 2023-12-25 LAB — SODIUM, URINE, RANDOM: Sodium, Ur: 97 mmol/L

## 2023-12-25 LAB — MAGNESIUM: Magnesium: 2 mg/dL (ref 1.7–2.4)

## 2023-12-25 LAB — OSMOLALITY, URINE: Osmolality, Ur: 388 mosm/kg (ref 300–900)

## 2023-12-25 MED ORDER — OXYCODONE HCL 5 MG PO TABS: 5.0000 mg | ORAL_TABLET | Freq: Three times a day (TID) | ORAL | Status: DC | PRN

## 2023-12-25 MED ORDER — ACETAMINOPHEN 325 MG PO TABS
650.0000 mg | ORAL_TABLET | Freq: Four times a day (QID) | ORAL | Status: DC
  Administered 2023-12-25 – 2023-12-27 (×6): 650 mg via ORAL
  Filled 2023-12-25 (×7): qty 2

## 2023-12-25 MED ORDER — HYDRALAZINE HCL 25 MG PO TABS
25.0000 mg | ORAL_TABLET | Freq: Four times a day (QID) | ORAL | Status: DC | PRN
  Administered 2023-12-26: 25 mg via ORAL
  Filled 2023-12-25: qty 1

## 2023-12-25 MED ORDER — PANTOPRAZOLE SODIUM 40 MG IV SOLR
40.0000 mg | Freq: Two times a day (BID) | INTRAVENOUS | Status: DC
  Administered 2023-12-25 – 2023-12-26 (×4): 40 mg via INTRAVENOUS
  Filled 2023-12-25 (×4): qty 10

## 2023-12-25 MED ORDER — LACTATED RINGERS IV SOLN: INTRAVENOUS | Status: DC

## 2023-12-25 MED ORDER — SENNOSIDES-DOCUSATE SODIUM 8.6-50 MG PO TABS
1.0000 | ORAL_TABLET | Freq: Two times a day (BID) | ORAL | Status: DC
  Administered 2023-12-25 – 2023-12-27 (×6): 1 via ORAL
  Filled 2023-12-25 (×6): qty 1

## 2023-12-25 MED ORDER — IOHEXOL 350 MG/ML SOLN
100.0000 mL | Freq: Once | INTRAVENOUS | Status: AC | PRN
  Administered 2023-12-25: 100 mL via INTRAVENOUS

## 2023-12-25 MED ORDER — BISACODYL 10 MG RE SUPP
10.0000 mg | Freq: Every day | RECTAL | Status: DC
  Administered 2023-12-25 – 2023-12-27 (×2): 10 mg via RECTAL
  Filled 2023-12-25 (×3): qty 1

## 2023-12-25 MED ORDER — SODIUM CHLORIDE 0.9% IV SOLUTION: Freq: Once | INTRAVENOUS | Status: DC

## 2023-12-25 MED ORDER — HYDROCODONE-ACETAMINOPHEN 5-325 MG PO TABS: 1.0000 | ORAL_TABLET | Freq: Four times a day (QID) | ORAL | 0 refills | Status: DC | PRN

## 2023-12-25 NOTE — Consult Note (Addendum)
 Consultation Note   Referring Provider:  [facility] PCP: Imelda Man, MD Primary Gastroenterologist: Para Bold       Reason for Consultation: Hematochezia DOA: 12/18/2023         Hospital Day: 8   ASSESSMENT    83 yo female with acute CVA, on plavix and ASA and now with rectal bleeding. CT angio negative for active bleeding. Bleeding likely 2/2 to stercoral colitis seen on imaging  Acute CVA ( POA).  Plavix and ASA currently on hold  Right femoral fracture 2/2 to fall S/p R total hip arthroplasty 12/22/22.   Aortic stenosis, s/p TAVR April 2025.   See PMH for additional history  Principal Problem:   Closed subcapital fracture of neck of right femur, initial encounter (HCC) Active Problems:   Cerebrovascular accident (CVA) (HCC)   Preop cardiovascular exam     PLAN:   --Dulcolax suppository followed by manual disimpaction by nurse. Depending on result she may also need an soap suds enema.  --Can't take MIralax due to allergies --Continue Senokot BID.  --No need to keep NPO from GI standpoint.   HPI   83 y.o. 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 hypothyroidism , neck fracture status post right total hip arthroplasty 12/21/2018  [facility] underwent TAVR late April 2025.  Presented to ED 2025 with altered mental status.  Found to have acute CVA.  Echo 12/19/23 showed EF 65%, normally functioning TAVR.  Neurology started having aspirin  and Plavix for 3 weeks then Plavix alone and recommended a 30-day heart monitor after discharge .  During this admission she had a fall resulting in a right hip fracture.  On 12/21/2023 she underwent a right total hip arthroplasty.  Postoperatively for which she received a u RBCs on 5/26.  Hemoglobin improved to 10.4  During the night patient developed  hematochezia.  Aspirin  and Plavix placed on hold.  Bleeding noted around 1 am today, at 3 am her hgb was stable. CT angio negative for active GI bleed but did show large stool ball in the rectum with mild presacral and perirectal stranding, which could indicate stercoral colitis. She has been started on BID Senokot.   Patient has dementia. Daughter helps with history. Patient doesn't have a problem with constipation at home. She doesn't see blood in stool at home. She has no abdominal pain.   Labs and Imaging:  Recent Labs    12/23/23 0701 12/24/23 0609 12/24/23 1844 12/25/23 0311  WBC 8.1 7.6  --   --   HGB 8.7* 8.3* 10.4* 10.3*  HCT 25.4* 24.6* 30.5* 30.2*  MCV 90.1 88.8  --   --   PLT 208 231  --   --    Recent Labs    12/23/23 0701 12/24/23 0609 12/25/23 0311  NA 132* 130* 130*  K 3.7 3.9 4.0  CL 102 100 101  CO2 24 23 25   GLUCOSE 103* 113* 111*  BUN 20 13 14   CREATININE 0.86 0.81 0.82  CALCIUM  8.3* 8.2* 8.4*     CT ANGIO GI BLEED CLINICAL DATA:  Right red blood per  rectum  EXAM: CTA ABDOMEN AND PELVIS WITHOUT AND WITH CONTRAST  TECHNIQUE: Multidetector CT imaging of the abdomen and pelvis was performed using the standard protocol during bolus administration of intravenous contrast. Multiplanar reconstructed images and MIPs were obtained and reviewed to evaluate the vascular anatomy.  RADIATION DOSE REDUCTION: This exam was performed according to the departmental dose-optimization program which includes automated exposure control, adjustment of the mA and/or kV according to patient size and/or use of iterative reconstruction technique.  CONTRAST:  OMNIPAQUE  IOHEXOL  350 MG/ML SOLN  COMPARISON:  None Available.  FINDINGS: VASCULAR  Aorta: Normal caliber aorta without aneurysm, dissection, vasculitis or significant stenosis.  Celiac: Patent without evidence of aneurysm, dissection, vasculitis or significant stenosis.  SMA: Patent without evidence  of aneurysm, dissection, vasculitis or significant stenosis.  Renals: Both renal arteries are patent without evidence of aneurysm, dissection, vasculitis, fibromuscular dysplasia or significant stenosis.  IMA: Patent without evidence of aneurysm, dissection, vasculitis or significant stenosis.  Inflow: Patent without evidence of aneurysm, dissection, vasculitis or significant stenosis.  Proximal Outflow: Bilateral common femoral and visualized portions of the superficial and profunda femoral arteries are patent without evidence of aneurysm, dissection, vasculitis or significant stenosis.  Veins: Negative  Review of the MIP images confirms the above findings.  NON-VASCULAR  Lower chest: Small amount of right pleural fluid  Hepatobiliary: No focal liver abnormality is seen. No gallstones, gallbladder wall thickening, or biliary dilatation.  Pancreas: Unremarkable. No pancreatic ductal dilatation or surrounding inflammatory changes.  Spleen: Normal in size without focal abnormality.  Adrenals/Urinary Tract: Adrenal glands are unremarkable. Kidneys are normal, without renal calculi, focal lesion, or hydronephrosis. Bladder is unremarkable.  Stomach/Bowel: Large stool ball in the rectum. Mild presacral and perirectal stranding. Rectosigmoid diverticulosis. No visible hemorrhage into the colon. No dilated small bowel.  Lymphatic: No lymphadenopathy  Reproductive: No adnexal mass or free fluid in the pelvis.  Other: Gas and edema within the soft tissues anterior to the right hip, likely postsurgical.  Musculoskeletal: Recent right hip arthroplasty. No acute osseous findings. Bilateral L5 pars interarticularis defects  IMPRESSION: 1. No visible hemorrhage into the colon. 2. Large stool ball in the rectum with mild presacral and perirectal stranding, which may indicate stercoral colitis. 3. Recent right hip arthroplasty with gas and edema within the soft tissues  anterior to the right hip, likely postsurgical. 4. Small amount of right pleural fluid.  Electronically Signed   By: Juanetta Nordmann M.D.   On: 12/25/2023 02:48    Pertinent GI Studies    None. 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 Abel Hoe & Dr. Honey Lusty   Scoliosis    Severe aortic stenosis    Wears glasses     Past Surgical History:  Procedure Laterality Date   CESAREAN SECTION  07/31/1976   TWINS, ONE WAS STILLBORN   INTRAOPERATIVE TRANSTHORACIC ECHOCARDIOGRAM N/A 11/20/2023   Procedure: ECHOCARDIOGRAM, TRANSTHORACIC;  Surgeon: Odie Benne, MD;  Location: [facility];  Service: Cardiovascular;  Laterality: N/A;   MOUTH SURGERY  07/31/1994   tongue resection   NODULE REMOVED     BENIGN, LOWER NECK   RIGHT HEART CATH AND CORONARY ANGIOGRAPHY N/A 09/21/2023   Procedure: RIGHT HEART CATH AND CORONARY ANGIOGRAPHY;  Surgeon: Odie Benne, MD;  Location: [facility];  Service: Cardiovascular;  Laterality: N/A;    Family History  Problem Relation Age of Onset   Thyroid  disease Mother    Dementia Mother    Thyroid  disease Sister     Prior to Admission medications   Medication Sig Start Date End Date Taking? Authorizing Provider  aspirin  81 MG chewable tablet Chew 1 tablet (81 mg total) by mouth daily. 11/22/23  Yes Ardia Kraft, PA-C  Calcium  Carbonate (CALTRATE 600 PO) Take 1 tablet by mouth daily after lunch.   Yes [provider]  Cholecalciferol (VITAMIN D3) 10 MCG (400 UNIT) tablet Take 400 Units by mouth daily  after lunch.   Yes [provider]  dorzolamide -timolol  (COSOPT ) 22.3-6.8 MG/ML ophthalmic solution Place 1 drop into the left eye every 12 (twelve) hours.   Yes [provider]  levothyroxine  (SYNTHROID ) 75 MCG tablet Take 75 mcg by mouth daily after lunch.   Yes [provider]  LUMIGAN 0.01 % SOLN Place 1 drop into the left eye at bedtime. 04/13/20  Yes [provider]  memantine  (NAMENDA ) 5 MG tablet TAKE 1 TABLET BY MOUTH EVERYDAY AT BEDTIME 06/14/23  Yes Millikan, Megan, NP  metoprolol  succinate (TOPROL -XL) 100 MG 24 hr tablet Take 100 mg by mouth daily after lunch. 03/20/20  Yes [provider]  Multiple Vitamin (MULTIVITAMIN WITH MINERALS) TABS tablet Take 1 tablet by mouth daily after lunch. Centrum Silver   Yes [provider]  polyvinyl alcohol  (ARTIFICIAL TEARS) 1.4 % ophthalmic solution Place 1 drop into both eyes as needed for dry eyes.   Yes [provider]  prednisoLONE  acetate (PREDNISOLONE  ACETATE P-F) 1 % ophthalmic suspension Place 1 drop into the right eye in the morning.   Yes [provider]  rosuvastatin  (CRESTOR ) 5 MG tablet Take 5 mg by mouth daily after lunch.   Yes [provider]  HYDROcodone -acetaminophen  (NORCO/VICODIN) 5-325 MG tablet Take 1 tablet by mouth every 6 (six) hours as needed for severe pain (pain score 7-10). 12/25/23   Versie Gores, PA-C    Current Facility-Administered Medications  Medication Dose Route Frequency Provider Last Rate Last Admin   0.9 %  sodium chloride  infusion (Manually program via Guardrails IV Fluids)   Intravenous Once Sundil, Subrina, MD       acetaminophen  (TYLENOL ) tablet 325-650 mg  325-650 mg Oral Q6H PRN Alona Jamaica A, PA-C       dorzolamide -timolol  (COSOPT ) 2-0.5 % ophthalmic solution 1 drop  1 drop Left Eye Q12H Versie Gores, PA-C   1 drop at 12/24/23 2137   feeding supplement (ENSURE ENLIVE / ENSURE PLUS) liquid 237 mL  237 mL Oral BID BM  Mason Sole, Pratik D, DO   237 mL at 12/24/23 1532   haloperidol  lactate (HALDOL ) injection 1 mg  1 mg Intravenous Q6H PRN Versie Gores, PA-C   1 mg at 12/19/23 2311   hydrALAZINE  (APRESOLINE ) injection 10 mg  10 mg Intravenous Q6H PRN Versie Gores, PA-C   10 mg at 12/21/23 0458   HYDROcodone -acetaminophen  (NORCO) 7.5-325 MG per tablet 1-2 tablet  1-2 tablet Oral Q4H PRN Versie Gores, PA-C  HYDROcodone-acetaminophen  (NORCO/VICODIN) 5-325 MG per tablet 1-2 tablet  1-2 tablet Oral Q4H PRN Versie Gores, PA-C       lactated ringers  infusion   Intravenous Continuous Sundil, Subrina, MD 100 mL/hr at 12/25/23 0446 Infusion Verify at 12/25/23 0446   latanoprost  (XALATAN ) 0.005 % ophthalmic solution 1 drop  1 drop Left Eye QHS Versie Gores, PA-C   1 drop at 12/24/23 2138   levothyroxine  (SYNTHROID ) tablet 75 mcg  75 mcg Oral [id number] Versie Gores, PA-C   75 mcg at 12/25/23 1191   lip balm (CARMEX) ointment   Topical PRN Versie Gores, PA-C       melatonin tablet 5 mg  5 mg Oral QHS PRN Versie Gores, PA-C   5 mg at 12/19/23 2212   memantine  (NAMENDA ) tablet 5 mg  5 mg Oral QHS Versie Gores, PA-C   5 mg at 12/24/23 2139   menthol-cetylpyridinium (CEPACOL) lozenge 3 mg  1 lozenge Oral PRN Versie Gores, PA-C       Or   phenol (CHLORASEPTIC) mouth spray 1 spray  1 spray Mouth/Throat PRN Versie Gores, PA-C       metoprolol succinate (TOPROL-XL) 24 hr tablet 100 mg  100 mg Oral QPC lunch Mason Sole, Pratik D, DO   100 mg at 12/24/23 1322   morphine  (PF) 2 MG/ML injection 0.5-1 mg  0.5-1 mg Intravenous Q2H PRN Alona Jamaica A, PA-C       multivitamin with minerals tablet 1 tablet  1 tablet Oral QPC lunch Versie Gores, PA-C   1 tablet at 12/24/23 1322   mupirocin ointment (BACTROBAN) 2 % 1 Application  1 Application Nasal BID Versie Gores, PA-C   1 Application at 12/24/23 2138   pantoprazole (PROTONIX) injection 40 mg  40 mg Intravenous Q12H Sundil, Subrina, MD   40 mg at  12/25/23 0155   polyvinyl alcohol (LIQUIFILM TEARS) 1.4 % ophthalmic solution 1 drop  1 drop Both Eyes PRN Versie Gores, PA-C       prednisoLONE  acetate (PRED FORTE ) 1 % ophthalmic suspension 1 drop  1 drop Right Eye q AM Versie Gores, PA-C   1 drop at 12/25/23 4782   prochlorperazine (COMPAZINE) injection 5 mg  5 mg Intravenous Q6H PRN Versie Gores, PA-C       rosuvastatin  (CRESTOR ) tablet 5 mg  5 mg Oral QPC lunch Versie Gores, PA-C   5 mg at 12/24/23 1326   senna-docusate (Senokot-S) tablet 1 tablet  1 tablet Oral BID Sundil, Subrina, MD   1 tablet at 12/25/23 0331   sodium chloride  flush (NS) 0.9 % injection 3-10 mL  3-10 mL Intravenous Q12H Versie Gores, PA-C   3 mL at 12/24/23 2141   sodium chloride  flush (NS) 0.9 % injection 3-10 mL  3-10 mL Intravenous PRN Versie Gores, PA-C        Allergies as of 12/18/2023 - Reviewed 12/18/2023  Allergen Reaction Noted   Brimonidine Itching 05/21/2015   Naphazoline-polyethyl glycol  12/30/2010   Tropicamide Other (See Comments) 05/21/2015    Social History   Socioeconomic History   Marital status: Widowed    Spouse name: Not on file   Number of children: 3   Years of education: Not on file   Highest education level: Not on file  Occupational History   Not on file  Tobacco Use   Smoking status: Former    Current packs/day: 0.00  Average packs/day: 0.2 packs/day for 2.0 years (0.4 ttl pk-yrs)    Types: Cigarettes    Start date: 07/31/1992    Quit date: 07/31/1994    Years since quitting: 29.4   Smokeless tobacco: Never   Tobacco comments:    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  Vaping Use   Vaping status: Never Used  Substance and Sexual Activity   Alcohol use: No   Drug use: No   Sexual activity: Not on file  Other Topics Concern   Not on file  Social History Narrative   12/28/21 lives alone   Social Drivers of Health   Financial Resource Strain: Not on file  Food Insecurity: No Food Insecurity (12/18/2023)    Hunger Vital Sign    Worried About Running Out of Food in the Last Year: Never true    Ran Out of Food in the Last Year: Never true  Transportation Needs: No Transportation Needs (12/18/2023)   PRAPARE - Administrator, Civil Service (Medical): No    Lack of Transportation (Non-Medical): No  Physical Activity: Not on file  Stress: Not on file  Social Connections: Socially Isolated (12/18/2023)   Social Connection and Isolation Panel [NHANES]    Frequency of Communication with Friends and Family: Twice a week    Frequency of Social Gatherings with Friends and Family: More than three times a week    Attends Religious Services: Never    Database administrator or Organizations: No    Attends Banker Meetings: Never    Marital Status: Widowed  Intimate Partner Violence: Not At Risk (12/18/2023)   Humiliation, Afraid, Rape, and Kick questionnaire    Fear of Current or Ex-Partner: No    Emotionally Abused: No    Physically Abused: No    Sexually Abused: No     Code Status   Code Status: Full Code  Review of Systems: All systems reviewed and negative except where noted in HPI.  Physical Exam: Vital signs in last 24 hours: Temp:  [98 F (36.7 C)-98.9 F (37.2 C)] 98.6 F (37 C) (05/27 0755) Pulse Rate:  [72-92] 78 (05/27 0755) Resp:  [16-19] 17 (05/27 0755) BP: (124-164)/(57-75) 154/67 (05/27 0755) SpO2:  [95 %-98 %] 96 % (05/27 0755) Last BM Date : 12/24/23  General:  Pleasant female in NAD Psych:  Cooperative. Normal mood and affect Eyes: Pupils equal. Bruising under right eye Ears:  Normal auditory acuity Nose: No deformity, discharge or lesions Neck:  Supple, no masses felt Lungs:  Clear to auscultation.  Heart:  Regular rate, regular rhythm, + murmur.  Abdomen:  Soft, nondistended, nontender, active bowel sounds, no masses felt Rectal :  Deferred Msk: Symmetrical without gross deformities.  Neurologic:  Alert, oriented, grossly normal  neurologically Extremities : No edema Skin:  Intact without significant lesions.    Intake/Output from previous day: 05/26 0701 - 05/27 0700 In: 456 [I.V.:141; Blood:315] Out: 1700 [Urine:1700] Intake/Output this shift:  No intake/output data recorded.   Mai Schwalbe, NP-C   12/25/2023, 8:28 AM  I have taken an interval history, thoroughly reviewed the chart and examined the patient. I agree with the Advanced Practitioner's note, impression and recommendations, and have recorded additional findings, impressions and recommendations below. I performed a substantive portion of this encounter (>50% time spent), including a complete performance of the medical decision making.  My additional thoughts are as follows:  Hematochezia with CTAP showing a large amount of retained rectal stool.  Probable  stercoral ulcer as a result as bleeding source that has now stopped.  Nursing communicated with our NP after the initial consultation to report significant stool output after manual disimpaction and enema administration.  No further bleeding at this point.  Continue bowel regimen as ordered  We will see her tomorrow and determine appropriate timing of resuming aspirin  and/or Plavix.  No current plans for sigmoidoscopy or colonoscopy unless patient has significant rebleeding.   Kerby Pearson III Office:[phone]

## 2023-12-25 NOTE — Progress Notes (Addendum)
 GI bleed Hematochezia and blood clot - RN reported that during the cleaning of the patient noticed that dark blood and blood clot coming per rectum. Per chart review no history of GI bleed in the past.  However during this hospitalization patient found anemic postoperatively and transfused 1 unit of blood so far.  Most recent hemoglobin is 10.4 checked in the evening 2/26. -Obtaining CT abdomen to rule out any GI bleed -Holding aspirin  and Plavix in the setting of GI bleed. -Starting to keep patient n.p.o. in case patient will need an EGD/colonoscopy. -Starting IV fluid LR 100 cc/h - Checking stat H&H and type and screen - Preparing 1 unit of blood in case patient needs blood transfusion.  Of note, patient has recent TAVR 10/2023 was on aspirin  and during this hospitalization patient has a CVA being started on DAPT with aspirin  and Plavix.  Paged on-call cardiology Dr. Franky Ivanoff, per cardiology from cardiac standpoint can hold aspirin  and Plavix in the setting of GI bleed.  Also discussed with neurology Dr. Dr. Lindzen and from neurology standpoint also could hold apirin and Plavix in the setting of GI bleed.  Also consulted and sent secure chat message to [facility] Dr. Cherryl Corona PA (unassigned patient) for further evaluation  Update, no evidence of acute GI bleed.  Evidence of stercoral colitis due to large stool ball in the rectum.  Postsurgical change of the right hip arthroplasty.  Small amount of rectal effusion.   Also stable hemoglobin 10.3.  Holding blood transfusion.  -Plan to hold aspirin  and Plavix and need GI recommendation before reinitiating.  Lavren Lewan, MD [facility] 12/25/2023, 1:36 AM

## 2023-12-25 NOTE — TOC Progression Note (Signed)
 Transition of Care [facility]) - Progression Note    Patient Details  Name: [name] MRN: [id number] Date of Birth: 13-May-1941  [facility] CM/SW Contact  Jonathan Neighbor, RN Phone Number: 12/25/2023, 11:30 AM  Clinical Narrative:     CM has met with the patient and her daughter. CM has asked CIR to evaluate pt. Daughter wants to see if pt will qualify. Daughter has decided on [name] so far for SNF if she is not a CIR candidate. Daughter to tour [facility] today. [name] has offered a bed.  TOC following.  Expected Discharge Plan: Skilled Nursing Facility Barriers to Discharge: Continued Medical Work up  Expected Discharge Plan and Services In-house Referral: Clinical Social Work Discharge Planning Services: CM Consult Post Acute Care Choice: Skilled Nursing Facility Living arrangements for the past 2 months: Single Family Home                                       Social Determinants of Health (SDOH) Interventions SDOH Screenings   Food Insecurity: No Food Insecurity (12/18/2023)  Housing: Low Risk  (12/18/2023)  Transportation Needs: No Transportation Needs (12/18/2023)  Utilities: Not At Risk (12/18/2023)  Social Connections: Socially Isolated (12/18/2023)  Tobacco Use: Medium Risk (12/21/2023)    Readmission Risk Interventions    11/21/2023   10:23 AM  Readmission Risk Prevention Plan  Post Dischage Appt Complete  Medication Screening Complete  Transportation Screening Complete

## 2023-12-25 NOTE — Care Management Important Message (Signed)
 Important Message  Patient Details  Name: [name] MRN: [id number] Date of Birth: 1941/03/24   Important Message Given:  Yes - Medicare IM     Felix Host 12/25/2023, 3:22 PM

## 2023-12-25 NOTE — Progress Notes (Signed)
Pt has arrived back from CT.

## 2023-12-25 NOTE — Progress Notes (Signed)
 Inpatient Rehab Coordinator Note:  I met with patient and her daughter, [name], at bedside to discuss CIR recommendations and goals/expectations of CIR stay.  We reviewed 3 hrs/day of therapy, physician follow up, and average length of stay 2 weeks (dependent upon progress) with goals of supervision 24/7.  We discussed that given pt's history of VD and new CVA, that we would not expect her cognition to clear with estimated length of stay and would expect that she would absolutely require 24/7 supervision, whenever she is up moving someone with her.  We reviewed need for insurance auth with [employer] and that [employer] would not approve SNF following CIR admission, so plan would have to be home.  [name] states pt has LTC policy through [facility] and can find out about waiting period and what they offer caregiver-wise.  I later spoke to pt's son, Lavonia Powers, on the phone.  I updated him that I would expect pt's LOS to be longer than 2 weeks, maybe 3-3.5, depending on how she progressed.  I shared that she would need someone (not necessarily a skilled medical provider) 24/7 to provide guidance and cuing for all mobility/ADLs.  Lavonia Powers shares that her [employer] policy has a 2 week waiting period to kick in.  I am prepared to proceed with prior auth request with [employer] once Lavonia Powers and [name] are able to discuss caregiver support.    Loye Rumble, PT, DPT Admissions Coordinator [phone] 12/25/23  3:53 PM

## 2023-12-25 NOTE — Plan of Care (Signed)
  Problem: Education: Goal: Knowledge of disease or condition will improve Outcome: Progressing Goal: Knowledge of secondary prevention will improve (MUST DOCUMENT ALL) Outcome: Progressing Goal: Knowledge of patient specific risk factors will improve (DELETE if not current risk factor) Outcome: Progressing   Problem: Ischemic Stroke/TIA Tissue Perfusion: Goal: Complications of ischemic stroke/TIA will be minimized Outcome: Progressing   Problem: Coping: Goal: Will verbalize positive feelings about self Outcome: Progressing Goal: Will identify appropriate support needs Outcome: Progressing   Problem: Health Behavior/Discharge Planning: Goal: Ability to manage health-related needs will improve Outcome: Progressing Goal: Goals will be collaboratively established with patient/family Outcome: Progressing   Problem: Self-Care: Goal: Ability to participate in self-care as condition permits will improve Outcome: Progressing Goal: Verbalization of feelings and concerns over difficulty with self-care will improve Outcome: Progressing Goal: Ability to communicate needs accurately will improve Outcome: Progressing   Problem: Nutrition: Goal: Risk of aspiration will decrease Outcome: Progressing Goal: Dietary intake will improve Outcome: Progressing   Problem: Education: Goal: Knowledge of General Education information will improve Description: Including pain rating scale, medication(s)/side effects and non-pharmacologic comfort measures Outcome: Progressing   Problem: Health Behavior/Discharge Planning: Goal: Ability to manage health-related needs will improve Outcome: Progressing   Problem: Clinical Measurements: Goal: Ability to maintain clinical measurements within normal limits will improve Outcome: Progressing Goal: Will remain free from infection Outcome: Progressing Goal: Diagnostic test results will improve Outcome: Progressing Goal: Respiratory complications will  improve Outcome: Progressing Goal: Cardiovascular complication will be avoided Outcome: Progressing   Problem: Activity: Goal: Risk for activity intolerance will decrease Outcome: Progressing   Problem: Nutrition: Goal: Adequate nutrition will be maintained Outcome: Progressing   Problem: Coping: Goal: Level of anxiety will decrease Outcome: Progressing   Problem: Elimination: Goal: Will not experience complications related to bowel motility Outcome: Progressing Goal: Will not experience complications related to urinary retention Outcome: Progressing   Problem: Pain Managment: Goal: General experience of comfort will improve and/or be controlled Outcome: Progressing   Problem: Safety: Goal: Ability to remain free from injury will improve Outcome: Progressing   Problem: Skin Integrity: Goal: Risk for impaired skin integrity will decrease Outcome: Progressing   Problem: Education: Goal: Knowledge of the prescribed therapeutic regimen will improve Outcome: Progressing Goal: Understanding of discharge needs will improve Outcome: Progressing Goal: Individualized Educational Video(s) Outcome: Progressing   Problem: Activity: Goal: Ability to avoid complications of mobility impairment will improve Outcome: Progressing Goal: Ability to tolerate increased activity will improve Outcome: Progressing   Problem: Clinical Measurements: Goal: Postoperative complications will be avoided or minimized Outcome: Progressing   Problem: Pain Management: Goal: Pain level will decrease with appropriate interventions Outcome: Progressing   Problem: Skin Integrity: Goal: Will show signs of wound healing Outcome: Progressing

## 2023-12-25 NOTE — Progress Notes (Signed)
 No BM even after suppository and enema, Fecal disimpaction done, got a huge amount of stool out, patient tolerated well, will continue to  monitor

## 2023-12-25 NOTE — Progress Notes (Signed)
 Pt taken down for CT scan at this time, daughter at bedside.

## 2023-12-25 NOTE — Progress Notes (Signed)
 Physical Therapy Treatment Patient Details Name: [name] MRN: [id number] DOB: [birth year] Today's Date: 12/25/2023   History of Present Illness 83 y.o. female presents to [facility] 12/18/23 with intermittent confusion. Brain MRI showed small punctate L frontal CVA. Pt with fall while admitted leading to R femoral neck fx, s/p R THA on 12/21/23. PMHx: COPD, osteoporosis, severe aortic stenosis status post TAVR, diagnosed mild Alzheimer's and vascular dementia    PT Comments  Pt with fair tolerance to treatment today. +2 Mod A to stand and take sidesteps along EOB. Nursing staff requested pt be left in bed due to pt recently trying to get out of bed. No change in DC/DME recs at this time. PT will continue to follow.     If plan is discharge home, recommend the following: A lot of help with walking and/or transfers;A lot of help with bathing/dressing/bathroom;Assistance with cooking/housework;Direct supervision/assist for medications management;Direct supervision/assist for financial management;Assist for transportation;Help with stairs or ramp for entrance   Can travel by private vehicle     No  Equipment Recommendations  Rolling walker (2 wheels)    Recommendations for Other Services       Precautions / Restrictions Precautions Precautions: Fall Restrictions Weight Bearing Restrictions Per Provider Order: Yes RLE Weight Bearing Per Provider Order: Weight bearing as tolerated     Mobility  Bed Mobility Overal bed mobility: Needs Assistance Bed Mobility: Supine to Sit, Sit to Supine     Supine to sit: +2 for physical assistance, Max assist Sit to supine: +2 for physical assistance, Max assist   General bed mobility comments: +2 Max A via helicopter as pt was not following commands.    Transfers Overall transfer level: Needs assistance Equipment used: 2 person hand held assist Transfers: Sit to/from Stand Sit to Stand: Mod assist, +2 physical assistance, +2 safety/equipment            General transfer comment: +2 Mod A to stand and take sidesteps along EOB. Nursing staff requested pt be left in bed due to pt recently trying to get out of bed.    Ambulation/Gait               General Gait Details: Deferred   Stairs             Wheelchair Mobility     Tilt Bed    Modified Rankin (Stroke Patients Only) Modified Rankin (Stroke Patients Only) Pre-Morbid Rankin Score: No symptoms Modified Rankin: Moderate disability     Balance Overall balance assessment: Needs assistance, History of Falls Sitting-balance support: No upper extremity supported, Feet supported Sitting balance-Leahy Scale: Fair Sitting balance - Comments: sitting EOB with CGA   Standing balance support: Bilateral upper extremity supported, During functional activity Standing balance-Leahy Scale: Poor Standing balance comment: reliant on external support                            Communication Communication Communication: No apparent difficulties  Cognition Arousal: Alert Behavior During Therapy: Restless, Impulsive   PT - Cognitive impairments: History of cognitive impairments, Orientation, Memory, Attention, Sequencing, Problem solving, Safety/Judgement   Orientation impairments: Place, Time, Situation                   PT - Cognition Comments: Pt received attempting to get out of bed. Following commands: Impaired Following commands impaired: Follows one step commands inconsistently, Follows one step commands with increased time    Cueing Cueing Techniques:  Verbal cues, Gestural cues, Tactile cues  Exercises      General Comments General comments (skin integrity, edema, etc.): VSS      Pertinent Vitals/Pain Pain Assessment Pain Assessment: Faces Faces Pain Scale: Hurts little more Pain Location: R hip with bed mobility Pain Descriptors / Indicators: Discomfort, Sore, Guarding Pain Intervention(s): Monitored during session,  Repositioned, Limited activity within patient's tolerance    Home Living                          Prior Function            PT Goals (current goals can now be found in the care plan section) Progress towards PT goals: Progressing toward goals    Frequency    Min 2X/week      PT Plan      Co-evaluation              AM-PAC PT "6 Clicks" Mobility   Outcome Measure  Help needed turning from your back to your side while in a flat bed without using bedrails?: A Lot Help needed moving from lying on your back to sitting on the side of a flat bed without using bedrails?: A Lot Help needed moving to and from a bed to a chair (including a wheelchair)?: A Lot Help needed standing up from a chair using your arms (e.g., wheelchair or bedside chair)?: A Lot Help needed to walk in hospital room?: Total Help needed climbing 3-5 steps with a railing? : Total 6 Click Score: 10    End of Session Equipment Utilized During Treatment: Gait belt Activity Tolerance: Treatment limited secondary to agitation Patient left: in bed;with call bell/phone within reach;with bed alarm set Nurse Communication: Mobility status PT Visit Diagnosis: Unsteadiness on feet (R26.81);Other abnormalities of gait and mobility (R26.89);Muscle weakness (generalized) (M62.81);History of falling (Z91.81)     Time: 2130-8657 PT Time Calculation (min) (ACUTE ONLY): 14 min  Charges:    $Therapeutic Activity: 8-22 mins PT General Charges $$ ACUTE PT VISIT: 1 Visit                     Rodgers Clack, PT, DPT Acute Rehab Services [phone]    [name] 12/25/2023, 3:38 PM

## 2023-12-25 NOTE — Progress Notes (Signed)
 Went to assist nurse tech to clean pt up when wiping noted dark red blood on wash cloth, turned pt over and saw that there was a small amount of blood with one clot coming from pt's rectum. Vitals stable, pt had received 1 unit RBC on day shift due to drop in hgb , provider on call notified , see new orders.   12/25/23 0123  Vitals  Temp 98.4 F (36.9 C)  Temp Source Oral  BP 134/61  MAP (mmHg) 81  BP Location Right Arm  BP Method Automatic  Patient Position (if appropriate) Lying  Pulse Rate 74  Pulse Rate Source Monitor  Resp 19  MEWS COLOR  MEWS Score Color Green  Oxygen Therapy  SpO2 95 %  O2 Device Room Air  MEWS Score  MEWS Temp 0  MEWS Systolic 0  MEWS Pulse 0  MEWS RR 0  MEWS LOC 0  MEWS Score 0

## 2023-12-25 NOTE — Plan of Care (Signed)
  Problem: Ischemic Stroke/TIA Tissue Perfusion: Goal: Complications of ischemic stroke/TIA will be minimized Outcome: Progressing   Problem: Coping: Goal: Will identify appropriate support needs Outcome: Progressing   Problem: Health Behavior/Discharge Planning: Goal: Goals will be collaboratively established with patient/family Outcome: Progressing   Problem: Nutrition: Goal: Risk of aspiration will decrease Outcome: Progressing   Problem: Clinical Measurements: Goal: Respiratory complications will improve Outcome: Progressing Goal: Cardiovascular complication will be avoided Outcome: Progressing   Problem: Coping: Goal: Level of anxiety will decrease Outcome: Progressing   Problem: Pain Managment: Goal: General experience of comfort will improve and/or be controlled Outcome: Progressing

## 2023-12-25 NOTE — Progress Notes (Signed)
 PROGRESS NOTE  [name] ZOX:[id number] DOB: 10-03-1940   PCP: Imelda Man, MD  Patient is from: Home.  DOA: 12/18/2023 LOS: 4  Chief complaints Chief Complaint  Patient presents with   Altered Mental Status     Brief Narrative / Interim history: 83 year old F with PMH of severe aortic stenosis status post TAVR (11/20/2023), vascular dementia, COPD, s/p right heart cath in February 2025, HTN, HLD, hypothyroidism, presented to the ER from home due to increasing confusion and unsteady gait for the past 5 days and brought to ED by family due to concern for urinary tract infection and dehydration.  Brain MRI revealed a small left high frontal punctate stroke.  CT angio head and neck with no LVO but suboptimal study.  TTE without significant finding.  LDL 45.  A1c 5%.  Neurology recommended DAPT for 3 weeks and then Plavix alone along with 30-day heart monitor after discharge.   Patient had unwitnessed fall out of bed and sustained right femoral neck fracture on 12/19/2023.  She underwent operative repair on 12/21/2023.  Postoperatively, hemoglobin dropped and she was transfused 1 unit.  Therapy recommended SNF.  While waiting on placement, patient had rectal bleeding the night of 5/26-27.  Plavix and aspirin  held after discussion with neurology and cardiology.  GI consulted and recommended bowel regimen.  Antiplatelet remains on hold.  Hemoglobin stable.  Subjective: Seen and examined earlier this morning.  No major events overnight or this morning.  Patient had rectal bleeding last night.  Aspirin  and Plavix held.  GI consulted and ordered bowel regimen.  Objective: Vitals:   12/25/23 0436 12/25/23 0755 12/25/23 1209 12/25/23 1545  BP: (!) 158/60 (!) 154/67 (!) 146/63 (!) 143/70  Pulse: 72 78 82 76  Resp: 18 17 17 17   Temp: 98.2 F (36.8 C) 98.6 F (37 C) 97.9 F (36.6 C) 97.7 F (36.5 C)  TempSrc:      SpO2: 95% 96% 98% 98%  Weight:      Height:         Examination:  GENERAL: No apparent distress.  Nontoxic. HEENT: MMM.  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  Subconjunctival hemorrhage medially.  Ecchymosis around right eye. NECK: Supple.  No apparent JVD.  RESP:  No IWOB.  Fair aeration bilaterally. CVS:  RRR. Heart sounds normal.  ABD/GI/GU: BS+. Abd soft, NTND.  MSK/EXT:  Moves extremities. No apparent deformity. No edema.  SKIN: no apparent skin lesion or wound NEURO: AA.  Oriented appropriately.  No apparent focal neuro deficit. PSYCH: Calm. Normal affect.   Consultants:  Neurology Cardiology Gastroenterology  Procedures:   Microbiology summarized: MRSA PCR screen nonreactive  Assessment and plan: Acute ischemic CVA, POA: Brain MRI revealed small left high frontal punctate stroke (recent TAVR procedure).  CT angio head and neck without significant finding but limited study.  TTE without significant finding. LDL 45, A1c 5 -Neurology recs:  -Plavix and aspirin  for 3 weeks followed by Plavix alone -30-day heart monitor-cardiology to arrange.  -Therapy recommended SNF -Plavix and aspirin  held due to rectal bleed the night of 5/26   Unwitnessed fall in the hospital: Unwitnessed fall the night of 5/21.  Acute mildly displaced and angulated fracture of mid right femoral neck -S/p ORIF by Dr. Curtiss Dowdy on 5/23 -Pain control-scheduled Tylenol  with as needed oxycodone .  Continue IV morphine  -SCD for VTE prophylaxis until we are able to resume pharmacologic VTE prophylaxis -Fall precautions  Postoperative acute blood loss anemia: B/l Hgb 11-12..  Transfused 1 unit on 5/26.  Rectal bleed-noted to have rectal bleed the night of 4/26.  H&H stable Recent Labs    12/18/23 1535 12/19/23 0422 12/20/23 0958 12/21/23 0651 12/22/23 0743 12/23/23 0701 12/24/23 0609 12/24/23 1844 12/25/23 0311 12/25/23 0839  HGB 11.9* 11.7* 12.4 14.2 9.4* 8.7* 8.3* 10.4* 10.3* 11.1*  -Hold antiplatelets -Continue PPI - Appreciate input by GI  Vascular  dementia: Awake and alert and oriented to self and daughter.  Follows commands.  Seems to move all extremities. -Continue home Namenda  -Reorientation and delirium precaution -Minimize avoid sedating medications  Essential hypertension: BP slightly elevated - Continue Toprol-XL - P.Grant. hydralazine  as needed  Hypothyroidism: TSH 9.5 and free T4 1.23 Suggesting euthyroid sick syndrome -Continue home Synthroid  -Recheck thyroid  panel in 4 to 6 weeks  Constipation -Disimpaction none bowel regimen per GI.   Hyperlipidemia -Continue Crestor   Body mass index is 21.39 kg/m.          DVT prophylaxis:  SCDs Start: 12/21/23 1541  Code Status: Full code Family Communication: Updated patient's daughter at bedside Level of care: Telemetry Medical Status is: Inpatient Remains inpatient appropriate because: Rectal bleed and acute CVA   Final disposition: Likely SNF versus CIR   55 minutes with more than 50% spent in reviewing records, counseling patient/family and coordinating care.   Sch Meds:  Scheduled Meds:  sodium chloride    Intravenous Once   acetaminophen   650 mg Oral Q6H WA   bisacodyl  10 mg Rectal Daily   dorzolamide -timolol   1 drop Left Eye Q12H   feeding supplement  237 mL Oral BID BM   latanoprost   1 drop Left Eye QHS   levothyroxine   75 mcg Oral Q0600   memantine   5 mg Oral QHS   metoprolol succinate  100 mg Oral QPC lunch   multivitamin with minerals  1 tablet Oral QPC lunch   mupirocin ointment  1 Application Nasal BID   pantoprazole (PROTONIX) IV  40 mg Intravenous Q12H   prednisoLONE  acetate  1 drop Right Eye q AM   rosuvastatin   5 mg Oral QPC lunch   senna-docusate  1 tablet Oral BID   sodium chloride  flush  3-10 mL Intravenous Q12H   Continuous Infusions:   PRN Meds:.haloperidol lactate, hydrALAZINE , HYDROcodone-acetaminophen , lip balm, melatonin, menthol-cetylpyridinium **OR** phenol, oxyCODONE , polyvinyl alcohol, prochlorperazine, sodium chloride   flush  Antimicrobials: Anti-infectives (From admission, onward)    Start     Dose/Rate Route Frequency Ordered Stop   12/21/23 1800  ceFAZolin  (ANCEF ) IVPB 2g/100 mL premix        2 g 200 mL/hr over 30 Minutes Intravenous Every 6 hours 12/21/23 1540 12/22/23 0102   12/21/23 1401  vancomycin (VANCOCIN) powder  Status:  Discontinued          As needed 12/21/23 1402 12/21/23 1437   12/21/23 1053  ceFAZolin  (ANCEF ) 2-4 GM/100ML-% IVPB       Note to Pharmacy: [name]: cabinet override      12/21/23 1053 12/21/23 1248   12/21/23 1045  ceFAZolin  (ANCEF ) IVPB 2g/100 mL premix        2 g 200 mL/hr over 30 Minutes Intravenous On call to Grant.R. 12/21/23 1037 12/21/23 1217        I have personally reviewed the following labs and images: CBC: Recent Labs  Lab 12/21/23 0651 12/22/23 0743 12/23/23 0701 12/24/23 0609 12/24/23 1844 12/25/23 0311 12/25/23 0839  WBC 10.7* 9.9 8.1 7.6  --   --  8.1  NEUTROABS 8.9*  --   --   --   --   --   --  HGB 14.2 9.4* 8.7* 8.3* 10.4* 10.3* 11.1*  HCT 41.6 27.6* 25.4* 24.6* 30.5* 30.2* 32.3*  MCV 89.5 90.2 90.1 88.8  --   --  88.3  PLT 201 191 208 231  --   --  276   BMP &GFR Recent Labs  Lab 12/19/23 0422 12/20/23 0958 12/21/23 0651 12/22/23 0743 12/23/23 0701 12/24/23 0609 12/25/23 0311  NA 135   < > 129* 130* 132* 130* 130*  K 3.8   < > 3.9 4.0 3.7 3.9 4.0  CL 103   < > 96* 99 102 100 101  CO2 21*   < > 19* 21* 24 23 25   GLUCOSE 76   < > 112* 106* 103* 113* 111*  BUN 15   < > 16 25* 20 13 14   CREATININE 0.85   < > 0.93 1.17* 0.86 0.81 0.82  CALCIUM  9.0   < > 9.2 8.4* 8.3* 8.2* 8.4*  MG 2.0  --   --   --  2.2 2.0 2.0  PHOS 2.4*  --   --   --   --   --   --    < > = values in this interval not displayed.   Estimated Creatinine Clearance: 49.5 mL/min (by C-G formula based on SCr of 0.82 mg/dL). Liver & Pancreas: Recent Labs  Lab 12/22/23 0743  AST 31  ALT 14  ALKPHOS 38  BILITOT 0.6  PROT 5.8*  ALBUMIN  3.1*   No  results for input(s): "LIPASE", "AMYLASE" in the last 168 hours. No results for input(s): "AMMONIA" in the last 168 hours. Diabetic: No results for input(s): "HGBA1C" in the last 72 hours. Recent Labs  Lab 12/22/23 2031  GLUCAP 121*   Cardiac Enzymes: No results for input(s): "CKTOTAL", "CKMB", "CKMBINDEX", "TROPONINI" in the last 168 hours. No results for input(s): "PROBNP" in the last 8760 hours. Coagulation Profile: No results for input(s): "INR", "PROTIME" in the last 168 hours. Thyroid  Function Tests: Recent Labs    12/24/23 0852  TSH 9.552*  FREET4 1.23*   Lipid Profile: No results for input(s): "CHOL", "HDL", "LDLCALC", "TRIG", "CHOLHDL", "LDLDIRECT" in the last 72 hours. Anemia Panel: Recent Labs    12/24/23 1323  VITAMINB12 1,348*  FOLATE 20.0  FERRITIN 127  TIBC 232*  IRON 14*  RETICCTPCT 2.5   Urine analysis:    Component Value Date/Time   COLORURINE STRAW (A) 12/18/2023 2000   APPEARANCEUR CLEAR 12/18/2023 2000   LABSPEC 1.008 12/18/2023 2000   PHURINE 6.0 12/18/2023 2000   GLUCOSEU NEGATIVE 12/18/2023 2000   HGBUR NEGATIVE 12/18/2023 2000   BILIRUBINUR NEGATIVE 12/18/2023 2000   KETONESUR 20 (A) 12/18/2023 2000   PROTEINUR NEGATIVE 12/18/2023 2000   NITRITE NEGATIVE 12/18/2023 2000   LEUKOCYTESUR TRACE (A) 12/18/2023 2000   Sepsis Labs: Invalid input(s): "PROCALCITONIN", "LACTICIDVEN"  Microbiology: Recent Results (from the past 240 hours)  Surgical PCR screen     Status: None   Collection Time: 12/21/23  2:00 AM   Specimen: Nasal Mucosa; Nasal Swab  Result Value Ref Range Status   MRSA, PCR NEGATIVE NEGATIVE Final   Staphylococcus aureus NEGATIVE NEGATIVE Final    Comment: (NOTE) The Xpert SA Assay (FDA approved for NASAL specimens in patients 56 years of age and older), is one component of a comprehensive surveillance program. It is not intended to diagnose infection nor to guide or monitor treatment. Performed at [facility]  Lab, [address], [place], [place] [address]     Radiology  Studies: CT ANGIO GI BLEED Result Date: 12/25/2023 CLINICAL DATA:  Right red blood per rectum EXAM: CTA ABDOMEN AND PELVIS WITHOUT AND WITH CONTRAST TECHNIQUE: Multidetector CT imaging of the abdomen and pelvis was performed using the standard protocol during bolus administration of intravenous contrast. Multiplanar reconstructed images and MIPs were obtained and reviewed to evaluate the vascular anatomy. RADIATION DOSE REDUCTION: This exam was performed according to the departmental dose-optimization program which includes automated exposure control, adjustment of the mA and/or kV according to patient size and/or use of iterative reconstruction technique. CONTRAST:  OMNIPAQUE  IOHEXOL  350 MG/ML SOLN COMPARISON:  None Available. FINDINGS: VASCULAR Aorta: Normal caliber aorta without aneurysm, dissection, vasculitis or significant stenosis. Celiac: Patent without evidence of aneurysm, dissection, vasculitis or significant stenosis. SMA: Patent without evidence of aneurysm, dissection, vasculitis or significant stenosis. Renals: Both renal arteries are patent without evidence of aneurysm, dissection, vasculitis, fibromuscular dysplasia or significant stenosis. IMA: Patent without evidence of aneurysm, dissection, vasculitis or significant stenosis. Inflow: Patent without evidence of aneurysm, dissection, vasculitis or significant stenosis. Proximal Outflow: Bilateral common femoral and visualized portions of the superficial and profunda femoral arteries are patent without evidence of aneurysm, dissection, vasculitis or significant stenosis. Veins: Negative Review of the MIP images confirms the above findings. NON-VASCULAR Lower chest: Small amount of right pleural fluid Hepatobiliary: No focal liver abnormality is seen. No gallstones, gallbladder wall thickening, or biliary dilatation. Pancreas: Unremarkable. No pancreatic ductal dilatation or  surrounding inflammatory changes. Spleen: Normal in size without focal abnormality. Adrenals/Urinary Tract: Adrenal glands are unremarkable. Kidneys are normal, without renal calculi, focal lesion, or hydronephrosis. Bladder is unremarkable. Stomach/Bowel: Large stool ball in the rectum. Mild presacral and perirectal stranding. Rectosigmoid diverticulosis. No visible hemorrhage into the colon. No dilated small bowel. Lymphatic: No lymphadenopathy Reproductive: No adnexal mass or free fluid in the pelvis. Other: Gas and edema within the soft tissues anterior to the right hip, likely postsurgical. Musculoskeletal: Recent right hip arthroplasty. No acute osseous findings. Bilateral L5 pars interarticularis defects IMPRESSION: 1. No visible hemorrhage into the colon. 2. Large stool ball in the rectum with mild presacral and perirectal stranding, which may indicate stercoral colitis. 3. Recent right hip arthroplasty with gas and edema within the soft tissues anterior to the right hip, likely postsurgical. 4. Small amount of right pleural fluid. Electronically Signed   By: Juanetta Nordmann M.D.   On: 12/25/2023 02:48      Adean Milosevic T. Saniyah Mondesir [facility]  If 7PM-7AM, please contact night-coverage [email or web address] 12/25/2023, 4:16 PM

## 2023-12-26 DIAGNOSIS — S72011A Unspecified intracapsular fracture of right femur, initial encounter for closed fracture: Secondary | ICD-10-CM | POA: Diagnosis not present

## 2023-12-26 DIAGNOSIS — H5461 Unqualified visual loss, right eye, normal vision left eye: Secondary | ICD-10-CM | POA: Insufficient documentation

## 2023-12-26 DIAGNOSIS — K625 Hemorrhage of anus and rectum: Secondary | ICD-10-CM | POA: Diagnosis not present

## 2023-12-26 DIAGNOSIS — K5900 Constipation, unspecified: Secondary | ICD-10-CM | POA: Diagnosis not present

## 2023-12-26 DIAGNOSIS — Z01810 Encounter for preprocedural cardiovascular examination: Secondary | ICD-10-CM | POA: Diagnosis not present

## 2023-12-26 DIAGNOSIS — I639 Cerebral infarction, unspecified: Secondary | ICD-10-CM | POA: Diagnosis not present

## 2023-12-26 DIAGNOSIS — D62 Acute posthemorrhagic anemia: Secondary | ICD-10-CM

## 2023-12-26 DIAGNOSIS — R933 Abnormal findings on diagnostic imaging of other parts of digestive tract: Secondary | ICD-10-CM | POA: Diagnosis not present

## 2023-12-26 DIAGNOSIS — K529 Noninfective gastroenteritis and colitis, unspecified: Secondary | ICD-10-CM | POA: Diagnosis not present

## 2023-12-26 DIAGNOSIS — K921 Melena: Secondary | ICD-10-CM

## 2023-12-26 LAB — RENAL FUNCTION PANEL
Albumin: 2.5 g/dL — ABNORMAL LOW (ref 3.5–5.0)
Anion gap: 4 — ABNORMAL LOW (ref 5–15)
BUN: 13 mg/dL (ref 8–23)
CO2: 25 mmol/L (ref 22–32)
Calcium: 8.5 mg/dL — ABNORMAL LOW (ref 8.9–10.3)
Chloride: 100 mmol/L (ref 98–111)
Creatinine, Ser: 0.87 mg/dL (ref 0.44–1.00)
GFR, Estimated: >60 mL/min (ref >60)
Glucose, Bld: 109 mg/dL — ABNORMAL HIGH (ref 70–99)
Phosphorus: 3 mg/dL (ref 2.5–4.6)
Potassium: 4.2 mmol/L (ref 3.5–5.1)
Sodium: 129 mmol/L — ABNORMAL LOW (ref 135–145)

## 2023-12-26 LAB — CBC
HCT: 32.1 % — ABNORMAL LOW (ref 36.0–46.0)
Hemoglobin: 11 g/dL — ABNORMAL LOW (ref 12.0–15.0)
MCH: 29.9 pg (ref 26.0–34.0)
MCHC: 34.3 g/dL (ref 30.0–36.0)
MCV: 87.2 fL (ref 80.0–100.0)
Platelets: 310 10*3/uL (ref 150–400)
RBC: 3.68 MIL/uL — ABNORMAL LOW (ref 3.87–5.11)
RDW: 13.3 % (ref 11.5–15.5)
WBC: 9 10*3/uL (ref 4.0–10.5)
nRBC: 0 % (ref 0.0–0.2)

## 2023-12-26 LAB — BPAM RBC
Blood Product Expiration Date: 202505282359
Blood Product Expiration Date: 202506202359
ISSUE DATE / TIME: 202505261506
Unit Type and Rh: 9500
Unit Type and Rh: 5100

## 2023-12-26 LAB — TYPE AND SCREEN
ABO/RH(D): O POS
Antibody Screen: NEGATIVE
Unit division: 0
Unit division: 0

## 2023-12-26 LAB — MAGNESIUM: Magnesium: 2.1 mg/dL (ref 1.7–2.4)

## 2023-12-26 MED ORDER — OXYCODONE HCL 5 MG PO TABS: 2.5000 mg | ORAL_TABLET | Freq: Three times a day (TID) | ORAL | Status: DC | PRN

## 2023-12-26 MED ORDER — PANTOPRAZOLE SODIUM 40 MG PO TBEC
40.0000 mg | DELAYED_RELEASE_TABLET | Freq: Two times a day (BID) | ORAL | Status: DC
  Administered 2023-12-26 – 2023-12-27 (×2): 40 mg via ORAL
  Filled 2023-12-26 (×2): qty 1

## 2023-12-26 MED ORDER — SODIUM CHLORIDE 1 G PO TABS
1.0000 g | ORAL_TABLET | Freq: Two times a day (BID) | ORAL | Status: DC
  Administered 2023-12-26 – 2023-12-27 (×3): 1 g via ORAL
  Filled 2023-12-26 (×3): qty 1

## 2023-12-26 MED ORDER — ASPIRIN 81 MG PO TBEC
81.0000 mg | DELAYED_RELEASE_TABLET | Freq: Every day | ORAL | Status: DC
  Administered 2023-12-26 – 2023-12-27 (×2): 81 mg via ORAL
  Filled 2023-12-26 (×2): qty 1

## 2023-12-26 NOTE — PMR Pre-admission (Signed)
 PMR Admission Coordinator Pre-Admission Assessment  Patient: [name] is an 83 y.o., female MRN: [id number] DOB: 1941/04/16 Height: 5\' 6"  (167.6 cm) Weight: 60.1 kg              Insurance Information HMO: ***    PPO: ***     PCP:      IPA:      80/20:      OTHER:  PRIMARY: [employer] Medicare      Policy#: [id number]       Subscriber: pt CM Name: ***      Phone#: ***     Fax#: *** Pre-Cert#: ***      Employer:  Benefits:  Phone #: ***     Name:  Eff. Date: ***     Deduct: ***      Out of Pocket Max: ***      Life Max:   CIR: ***      SNF: *** Outpatient: ***     Co-Pay: *** Home Health: ***      Co-Pay: *** DME: ***     Co-Pay: *** Providers:  SECONDARY:       Policy#:       Phone#:   Financial Counselor:       Phone#:   The "Data Collection Information Summary" for patients in Inpatient Rehabilitation Facilities with attached "Privacy Act Statement-Health Care Records" was provided and verbally reviewed with: Patient and Family  Emergency Contact Information Contact Information     Name Relation Home Work Mobile   Gary Daughter [phone]   Silvie, Obremski   [phone]   Betsy, Rosello   [phone]      Other Contacts   None on File    Current Medical History  Patient Admitting Diagnosis: CVA, hip fracture s/p THA  History of Present Illness: PT is an 83 y/o female with PMH of 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  Stroke service was consulted and pt underwent full stroke workup.  EF 65-70% and LV with grade 1 diastolic dysfunction, A1C 5.0.  Recommendations for 30-day heart monitor as an outpatient, and  aspiring/plavix x3 weeks then aspirin  alone.  On HD2 pt with unwitnessed fall.  Initial imaging negative on 5/21, but repeat imaging on 5/22 revealed a displaced angulated femoral neck fracture.  Orthopedics was consulted and she underwent a right THA with Dr. Curtiss Dowdy on 5/23.  Post op she is weightbearing as tolerated on the RLE.  Post op course complicated by ABLA and she was transfused one unit of blood on 5/24.  On 5/27 AM she developed BRBPR and GI consulted. Workup revealed large stool burden and it was felt likely that stercoral ulcer was cause of bleeding which was stopped.  Nursing able to manually disimpact patient after enema and pt had no further bleeding.  Therapy ongoing and pt has been recommended for CIR.   Complete NIHSS TOTAL: 3 Glasgow Coma Scale Score: 14  Patient's medical record from [facility] has been reviewed by the rehabilitation admission coordinator and physician.  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 Abel Hoe & Dr. Honey Lusty   Scoliosis    Severe aortic stenosis    Wears glasses     Has the patient had major surgery during 100 days prior to admission? Yes  Family History  family history includes Dementia in her mother; Thyroid  disease in her mother and sister.   Current Medications   Current Facility-Administered Medications:    0.9 %  sodium chloride  infusion (Manually program via Guardrails IV Fluids), , Intravenous, Once, Sundil, Subrina, MD   acetaminophen  (TYLENOL ) tablet 650 mg, 650 mg, Oral, Q6H WA, Gonfa, Taye T, MD, 650 mg at 12/26/23 1039   bisacodyl  (DULCOLAX) suppository 10 mg, 10 mg,  Rectal, Daily, Arlee Bellows, NP, 10 mg at 12/25/23 0945   dorzolamide -timolol  (COSOPT ) 2-0.5 % ophthalmic solution 1 drop, 1 drop, Left Eye, Q12H, McClung, Sarah A, PA-C, 1 drop at 12/26/23 1039   feeding supplement (ENSURE ENLIVE / ENSURE PLUS) liquid 237 mL, 237 mL, Oral, BID BM, Shah, Pratik D, DO, 237 mL at 12/26/23 1040   haloperidol  lactate (HALDOL ) injection 1 mg, 1 mg, Intravenous, Q6H PRN, Versie Gores, PA-C, 1 mg at 12/19/23 2311   hydrALAZINE  (APRESOLINE ) tablet 25 mg, 25 mg, Oral, Q6H PRN, Gonfa, Taye T, MD, 25 mg at 12/26/23 0059   HYDROcodone -acetaminophen  (NORCO) 7.5-325 MG per tablet 1-2 tablet, 1-2 tablet, Oral, Q4H PRN, McClung, Sarah A, PA-C   latanoprost  (XALATAN ) 0.005 % ophthalmic solution 1 drop, 1 drop, Left Eye, QHS, McClung, Sarah A, PA-C, 1 drop at 12/25/23 2121   levothyroxine  (SYNTHROID ) tablet 75 mcg, 75 mcg, Oral, Q0600, Versie Gores, PA-C, 75 mcg at 12/26/23 0607   lip balm (CARMEX) ointment, , Topical, PRN, Versie Gores, PA-C   melatonin tablet 5 mg, 5 mg, Oral, QHS PRN, Versie Gores, PA-C, 5 mg at 12/19/23 2212   memantine  (NAMENDA ) tablet 5 mg, 5 mg, Oral, QHS, McClung, Sarah A, PA-C, 5 mg at 12/25/23 2119   menthol -cetylpyridinium (CEPACOL) lozenge 3 mg, 1 lozenge, Oral, PRN **OR** phenol (CHLORASEPTIC) mouth spray 1 spray, 1 spray, Mouth/Throat, PRN, McClung, Sarah A, PA-C   metoprolol  succinate (TOPROL -XL) 24 hr tablet 100 mg, 100 mg, Oral, QPC lunch, Mason Sole, Pratik D, DO, 100 mg at 12/25/23 1348   multivitamin with minerals tablet 1 tablet, 1 tablet, Oral, QPC lunch, Versie Gores, PA-C, 1 tablet at 12/25/23 1348   oxyCODONE  (Oxy IR/ROXICODONE ) immediate release tablet 5 mg, 5 mg, Oral, Q8H PRN, Gonfa, Taye T, MD   pantoprazole  (PROTONIX ) injection 40 mg, 40 mg, Intravenous, Q12H, Sundil, Subrina, MD, 40 mg at 12/26/23 1038   polyvinyl alcohol  (LIQUIFILM TEARS) 1.4 % ophthalmic solution 1 drop, 1 drop, Both Eyes, PRN, McClung, Sarah A,  PA-C   prednisoLONE  acetate (PRED FORTE ) 1 % ophthalmic suspension 1 drop, 1 drop, Right Eye, q AM, McClung, Sarah A, PA-C, 1 drop at 12/26/23 1610   prochlorperazine  (COMPAZINE ) injection 5 mg, 5 mg, Intravenous, Q6H PRN, McClung, Sarah A, PA-C   rosuvastatin  (CRESTOR ) tablet 5 mg, 5 mg, Oral, QPC lunch, McClung, Sarah A, PA-C, 5 mg at 12/25/23 1348   senna-docusate (Senokot-S) tablet 1  tablet, 1 tablet, Oral, BID, Sundil, Subrina, MD, 1 tablet at 12/26/23 1042   sodium chloride  flush (NS) 0.9 % injection 3-10 mL, 3-10 mL, Intravenous, Q12H, McClung, Sarah A, PA-C, 10 mL at 12/26/23 1041   sodium chloride  flush (NS) 0.9 % injection 3-10 mL, 3-10 mL, Intravenous, PRN, Versie Gores, PA-C   sodium chloride  tablet 1 g, 1 g, Oral, BID WC, Gonfa, Taye T, MD, 1 g at 12/26/23 1038  Patients Current Diet:  Diet Order             Diet Heart Room service appropriate? Yes; Fluid consistency: Thin; Fluid restriction: 1500 mL Fluid  Diet effective now                   Precautions / Restrictions Precautions Precautions: Fall Precaution/Restrictions Comments: Alz/Dementia at baseline, fear of falling. Watch HR Restrictions Weight Bearing Restrictions Per Provider Order: Yes RLE Weight Bearing Per Provider Order: Weight bearing as tolerated   Has the patient had 2 or more falls or a fall with injury in the past year?Yes  Prior Activity Level Limited Community (1-2x/wk): mod I at home for mobility and ADLs some light cooking/cleaning, assist for med management and finances, does not drive, able to take walks with daughter in the neighborhood, daughter had cameras set up for supervision during the day while she was at work  Prior Functional Level Prior Function Prior Level of Function : Independent/Modified Independent, History of Falls (last six months) Mobility Comments: Ind with no AD, will furniture surf, likes to walk to [employer] and get mail. and likes to go on walks around the  neighborhood with daughter. Multiple recent falls ADLs Comments: does not drive, typically does small cooking and cleaning and showers standing up, daughter manages medicines and finanaces  Self Care: Did the patient need help bathing, dressing, using the toilet or eating?  Independent  Indoor Mobility: Did the patient need assistance with walking from room to room (with or without device)? Independent  Stairs: Did the patient need assistance with internal or external stairs (with or without device)? Independent  Functional Cognition: Did the patient need help planning regular tasks such as shopping or remembering to take medications? Needed some help  Patient Information Are you of Hispanic, Latino/a,or Spanish origin?: A. No, not of Hispanic, Latino/a, or Spanish origin, X. Patient unable to respond (all questions answered by proxy) What is your race?: A. White, X. Patient unable to respond Do you need or want an interpreter to communicate with a doctor or health care staff?: 0. No  Patient's Response To:  Health Literacy and Transportation Is the patient able to respond to health literacy and transportation needs?: No Health Literacy - How often do you need to have someone help you when you read instructions, pamphlets, or other written material from your doctor or pharmacy?: Patient unable to respond In the past 12 months, has lack of transportation kept you from medical appointments or from getting medications?: No In the past 12 months, has lack of transportation kept you from meetings, work, or from getting things needed for daily living?: No  Journalist, newspaper / Equipment Home Equipment: None, Other (comment) (cameras inside the house for daughter to make sure mom is ok while shes at work)  Prior Device Use: Indicate devices/aids used by the patient prior to current illness, exacerbation or injury? None of the above  Current Functional Level Cognition  Orientation Level:  Oriented to person    Extremity Assessment (  includes Sensation/Coordination)  Upper Extremity Assessment: Generalized weakness (Pt able to use BUEs to engage in functional tasks for grooming/dressing. ROM WFL bilaterally, MMT not assessed today.)  Lower Extremity Assessment: Generalized weakness, RLE deficits/detail RLE Deficits / Details: recent THA 12/21/23    ADLs  Overall ADL's : Needs assistance/impaired Eating/Feeding: Set up, Bed level Eating/Feeding Details (indicate cue type and reason): meal tray in room, Pt reports self-feeding- not witnessed Grooming: Sitting, Contact guard assist Grooming Details (indicate cue type and reason): sink level Upper Body Bathing: Minimal assistance, Sitting Lower Body Bathing: Moderate assistance, Sitting/lateral leans Lower Body Bathing Details (indicate cue type and reason): knees down Upper Body Dressing : Moderate assistance, Sitting, Cueing for sequencing Upper Body Dressing Details (indicate cue type and reason): doff/don gown; Pt needing cues for her to initiate task without OT assist and to sequence doffing shirt. Lower Body Dressing: Total assistance, Sitting/lateral leans, Sit to/from stand Lower Body Dressing Details (indicate cue type and reason): remains total A for socks Toilet Transfer: Moderate assistance, +2 for physical assistance, +2 for safety/equipment, BSC/3in1 Toilet Transfer Details (indicate cue type and reason): step pivot to [facility], cues for hand placement safety-reaching back to sit. Toileting- Clothing Manipulation and Hygiene: Maximal assistance, Sit to/from stand Toileting - Clothing Manipulation Details (indicate cue type and reason): rear pericare assist needed from therapist. Functional mobility during ADLs: Moderate assistance, +2 for safety/equipment, Rolling walker (2 wheels) General ADL Comments: needs a lot of reassurance    Mobility  Overal bed mobility: Needs Assistance Bed Mobility: Supine to Sit Rolling:  Max assist Sidelying to sit: Max assist, Used rails, HOB elevated Supine to sit: Mod assist Sit to supine: +2 for physical assistance, Max assist General bed mobility comments: Mod A to scoot hips forward and for trunk elevation. Therapist assisting with RLE management and cues for pt to shift LLE during bed mobility    Transfers  Overall transfer level: Needs assistance Equipment used: 2 person hand held assist, Rolling walker (2 wheels) Transfers: Sit to/from Stand, Bed to chair/wheelchair/BSC Sit to Stand: Mod assist, +2 physical assistance, +2 safety/equipment Bed to/from chair/wheelchair/BSC transfer type:: Step pivot Step pivot transfers: Mod assist, +2 physical assistance, +2 safety/equipment General transfer comment: Cues for hand placement and upright standing posture. +2 Mod A to power up from bed and BSC. RLE uncoordinated with gait.    Ambulation / Gait / Stairs / Wheelchair Mobility  Ambulation/Gait Ambulation/Gait assistance: +2 physical assistance, +2 safety/equipment, Mod assist Gait Distance (Feet): 35 Feet (10+25) Assistive device: 2 person hand held assist, Rolling walker (2 wheels) Gait Pattern/deviations: Step-through pattern, Drifts right/left, Trunk flexed, Narrow base of support, Decreased stride length General Gait Details: 2 bouts of ambulation. First bout with 2 person HHA to sink. 2nd bout with RW in hallway. Daughter used as visual cue for ambulation distance. Constant cues for sequencing and increasing step length. Gait velocity: decr    Posture / Balance Dynamic Sitting Balance Sitting balance - Comments: sitting EOB with CGA Balance Overall balance assessment: Needs assistance, History of Falls Sitting-balance support: No upper extremity supported, Feet supported Sitting balance-Leahy Scale: Fair Sitting balance - Comments: sitting EOB with CGA Standing balance support: Bilateral upper extremity supported, During functional activity Standing  balance-Leahy Scale: Poor Standing balance comment: reliant on external support    Special needs/care consideration Skin surgical incision for R hip THA and Behavioral consideration sundowns     Previous Home Environment (from acute therapy documentation) Living Arrangements: Children (daughter) Available Help at Discharge:  Family, Available PRN/intermittently Type of Home: House Home Layout: One level Home Access: Stairs to enter Entrance Stairs-Rails: None Entrance Stairs-Number of Steps: 3 Bathroom Shower/Tub: Engineer, manufacturing systems: Standard Home Care Services: No Additional Comments: daughter works during the day,  Discharge Living Setting Plans for Discharge Living Setting: Lives with (comment) (daughter) Type of Home at Discharge: House Discharge Home Layout: One level Discharge Home Access: Stairs to enter Entrance Stairs-Rails: None Entrance Stairs-Number of Steps: 3 Discharge Bathroom Shower/Tub: Tub/shower unit Discharge Bathroom Toilet: Standard Discharge Bathroom Accessibility: Yes How Accessible: Accessible via walker Does the patient have any problems obtaining your medications?: No  Social/Family/Support Systems Anticipated Caregiver: Daughter [name] and caregivers through LTC policy Anticipated Caregiver's Contact Information: [name] [phone] Ability/Limitations of Caregiver: [name] works during the day, plan to have caregivers through pt's LTC policy (2 week waiting period from hospital discharge) while she is at work Engineer, structural Availability: 24/7 Discharge Plan Discussed with Primary Caregiver: Yes Is Caregiver In Agreement with Plan?: Yes Does Caregiver/Family have Issues with Lodging/Transportation while Pt is in Rehab?: No   Goals Patient/Family Goal for Rehab: PT/OT supervision, SLP min assist Expected length of stay: 14-16 days Additional Information: Discharge plan: home to [facility]'s home (as PTA), with [name] and hired caregivers  providing 24/7 supervision Pt/Family Agrees to Admission and willing to participate: Yes Program Orientation Provided & Reviewed with Pt/Caregiver Including Roles  & Responsibilities: Yes   Decrease burden of Care through IP rehab admission: n/a   Possible need for SNF placement upon discharge: Not anticipated.  Plan home with daughter as previous.  Plan to use LTC policy for additional caregivers.  Plan has a 2 week waiting period from hospital discharge.    Patient Condition: This patient's condition remains as documented in the consult dated 12/26/23, in which the Rehabilitation Physician determined and documented that the patient's condition is appropriate for intensive rehabilitative care in an inpatient rehabilitation facility. Will admit to inpatient rehab pending insurance approval ***.  Preadmission Screen Completed By:  Mickey Alar, PT, DPT 12/26/2023 12:59 PM ______________________________________________________________________   Discussed status with Dr. Aaron Aason***at *** and received approval for admission today.  Admission Coordinator:  Caitlin E Warren, time***/Date***

## 2023-12-26 NOTE — Progress Notes (Addendum)
 PROGRESS NOTE  [name] OZH:[id number] DOB: 11-Mar-1941   PCP: Imelda Man, MD  Patient is from: Home.  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  Brain MRI revealed a small left high frontal punctate stroke.  CT angio head and neck with no LVO but suboptimal study.  TTE without significant finding.  LDL 45.  A1c 5%.  Neurology recommended DAPT for 3 weeks and then Plavix alone along with 30-day heart monitor after discharge.   Patient had unwitnessed fall out of bed and sustained right femoral neck fracture on 12/19/2023.  She underwent operative repair on 12/21/2023.  Postoperatively, hemoglobin dropped and she was transfused 1 unit.  Therapy recommended SNF.  While waiting on placement, patient had rectal bleeding the night of 5/26-27.  Plavix and aspirin  held after discussion with neurology and cardiology.  GI consulted and recommended bowel regimen.  Antiplatelet remains on hold.  Hemoglobin stable.  Subjective: Seen and examined earlier this morning.  No major events overnight or this morning.  No complaints but seems a little frustrated about not knowing where she is at and and the situation.  She is awake and alert and oriented to self and daughter.  She denies pain.  No report of GI bleed.  Daughter worried about confusion and "dementia".  She likes her to get out of the bed and walk more.  Objective: Vitals:   12/26/23 0053 12/26/23 0059 12/26/23 0333 12/26/23 1106  BP: (!) 170/66 (!) 170/66 (!) 148/70 130/66  Pulse: 63  82 77   Resp:    16  Temp: 98.2 F (36.8 C)  98.6 F (37 C) (!) 97.5 F (36.4 C)  TempSrc: Oral  Axillary Oral  SpO2: 97%  96% 97%  Weight:      Height:        Examination:  GENERAL: No apparent distress.  Nontoxic. HEENT: MMM.  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  Subconjunctival hemorrhage medially.  Ecchymosis around right eye. NECK: Supple.  No apparent JVD.  RESP:  No IWOB.  Fair aeration bilaterally. CVS:  RRR. Heart sounds normal.  ABD/GI/GU: BS+. Abd soft, NTND.  MSK/EXT:  Moves extremities but limited by pain in right leg. No edema.  SKIN: no apparent skin lesion or wound NEURO: AA.  Oriented to self and her daughter.  Follows commands.  PSYCH: Calm. Normal affect.   Consultants:  Neurology Cardiology Gastroenterology  Procedures:   Microbiology summarized: MRSA PCR screen nonreactive  Assessment and plan: Acute ischemic CVA, POA: Brain MRI revealed small left high frontal punctate stroke (recent TAVR procedure).  CT angio head and neck without significant finding but limited study.  TTE without significant finding. LDL 45, A1c 5 -Neurology recs:  -Plavix and aspirin  for 3 weeks followed by Plavix alone -30-day heart monitor-cardiology to arrange.  -Therapy recommended SNF -Plavix and aspirin  held due to rectal bleed the night of 5/26 pending GI input   Unwitnessed fall in the hospital: Unwitnessed fall the night of 5/21.  Acute mildly displaced and angulated fracture of mid right  femoral neck -S/p ORIF by Dr. Curtiss Dowdy on 5/23 -Pain control-scheduled Tylenol  with as needed oxycodone .  Discontinued IV morphine . -SCD for VTE prophylaxis until we are able to resume pharmacologic VTE prophylaxis -Therapy recommended CIR -Fall precautions  Postoperative acute blood loss anemia: B/l Hgb 11-12..  Transfused 1 unit on 5/26. Rectal bleed-noted to have rectal bleed the night of 4/26.  H&H stable Recent Labs    12/19/23 0422 12/20/23 0958 12/21/23 0651 12/22/23 0743  12/23/23 0701 12/24/23 0609 12/24/23 1844 12/25/23 0311 12/25/23 0839 12/26/23 0552  HGB 11.7* 12.4 14.2 9.4* 8.7* 8.3* 10.4* 10.3* 11.1* 11.0*  -Hold antiplatelets pending input by GI -Continue PPI  Vascular dementia: Awake and alert and oriented to self and daughter.  Follows commands.  -Continue home Namenda  -Reorientation and delirium precaution -Minimize avoid sedating medications  Essential hypertension: BP slightly elevated - Continue Toprol-XL - P.Grant. 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  Urine sodium 97 - Fluid restriction to 1500 cc - P.Grant. sodium chloride  - Recheck in the morning  Constipation -Disimpaction and bowel regimen per GI.   Hyperlipidemia -Continue Crestor    Body mass index is 21.39 kg/m.          DVT prophylaxis:  Place TED hose Start: 12/26/23 1141 Place and maintain sequential compression device Start: 12/25/23 1619 SCDs Start: 12/21/23 1541  Code Status: Full code Family Communication: Updated patient's daughter at bedside Level of care: Telemetry Medical Status is: Inpatient Remains inpatient appropriate because: Rectal bleed and acute CVA   Final disposition: CIR   35 minutes with more than 50% spent in reviewing records, counseling patient/family and coordinating care.   Sch Meds:  Scheduled Meds:  sodium chloride    Intravenous Once   acetaminophen   650 mg Oral Q6H WA   bisacodyl  10 mg Rectal Daily   dorzolamide -timolol   1 drop Left Eye Q12H   feeding supplement  237 mL Oral BID BM   latanoprost   1 drop Left Eye QHS   levothyroxine   75 mcg Oral Q0600   memantine   5 mg Oral QHS   metoprolol succinate  100 mg Oral QPC lunch   multivitamin with minerals  1 tablet Oral QPC lunch   pantoprazole (PROTONIX) IV  40 mg Intravenous Q12H   prednisoLONE  acetate  1 drop Right  Eye q AM   rosuvastatin   5 mg Oral QPC lunch   senna-docusate  1 tablet Oral BID   sodium chloride  flush  3-10 mL Intravenous Q12H   sodium chloride   1 g Oral BID WC   Continuous Infusions:   PRN Meds:.haloperidol lactate, hydrALAZINE , HYDROcodone-acetaminophen , lip balm, melatonin, menthol-cetylpyridinium **OR** phenol, oxyCODONE , polyvinyl alcohol, prochlorperazine, sodium chloride  flush  Antimicrobials: Anti-infectives (From admission, onward)    Start     Dose/Rate Route Frequency Ordered Stop   12/21/23 1800  ceFAZolin  (ANCEF ) IVPB 2g/100 mL premix        2 g 200 mL/hr over 30 Minutes Intravenous Every 6 hours 12/21/23 1540 12/22/23 0102   12/21/23 1401  vancomycin (VANCOCIN) powder  Status:  Discontinued          As needed 12/21/23 1402 12/21/23 1437   12/21/23 1053  ceFAZolin  (ANCEF ) 2-4 GM/100ML-% IVPB       Note to Pharmacy: [name]: cabinet override      12/21/23 1053 12/21/23 1248   12/21/23 1045  ceFAZolin  (  ANCEF ) IVPB 2g/100 mL premix        2 g 200 mL/hr over 30 Minutes Intravenous On call to Grant.R. 12/21/23 1037 12/21/23 1217        I have personally reviewed the following labs and images: CBC: Recent Labs  Lab 12/21/23 0651 12/22/23 0743 12/23/23 0701 12/24/23 0609 12/24/23 1844 12/25/23 0311 12/25/23 0839 12/26/23 0552  WBC 10.7* 9.9 8.1 7.6  --   --  8.1 9.0  NEUTROABS 8.9*  --   --   --   --   --   --   --   HGB 14.2 9.4* 8.7* 8.3* 10.4* 10.3* 11.1* 11.0*  HCT 41.6 27.6* 25.4* 24.6* 30.5* 30.2* 32.3* 32.1*  MCV 89.5 90.2 90.1 88.8  --   --  88.3 87.2  PLT 201 191 208 231  --   --  276 310   BMP &GFR Recent Labs  Lab 12/22/23 0743 12/23/23 0701 12/24/23 0609 12/25/23 0311 12/26/23 0552  NA 130* 132* 130* 130* 129*  K 4.0 3.7 3.9 4.0 4.2  CL 99 102 100 101 100  CO2 21* 24 23 25 25   GLUCOSE 106* 103* 113* 111* 109*  BUN 25* 20 13 14 13   CREATININE 1.17* 0.86 0.81 0.82 0.87  CALCIUM  8.4* 8.3* 8.2* 8.4* 8.5*  MG  --  2.2 2.0 2.0  2.1  PHOS  --   --   --   --  3.0   Estimated Creatinine Clearance: 46.7 mL/min (by C-G formula based on SCr of 0.87 mg/dL). Liver & Pancreas: Recent Labs  Lab 12/22/23 0743 12/26/23 0552  AST 31  --   ALT 14  --   ALKPHOS 38  --   BILITOT 0.6  --   PROT 5.8*  --   ALBUMIN  3.1* 2.5*   No results for input(s): "LIPASE", "AMYLASE" in the last 168 hours. No results for input(s): "AMMONIA" in the last 168 hours. Diabetic: No results for input(s): "HGBA1C" in the last 72 hours. Recent Labs  Lab 12/22/23 2031  GLUCAP 121*   Cardiac Enzymes: No results for input(s): "CKTOTAL", "CKMB", "CKMBINDEX", "TROPONINI" in the last 168 hours. No results for input(s): "PROBNP" in the last 8760 hours. Coagulation Profile: No results for input(s): "INR", "PROTIME" in the last 168 hours. Thyroid  Function Tests: Recent Labs    12/24/23 0852  TSH 9.552*  FREET4 1.23*   Lipid Profile: No results for input(s): "CHOL", "HDL", "LDLCALC", "TRIG", "CHOLHDL", "LDLDIRECT" in the last 72 hours. Anemia Panel: Recent Labs    12/24/23 1323  VITAMINB12 1,348*  FOLATE 20.0  FERRITIN 127  TIBC 232*  IRON 14*  RETICCTPCT 2.5   Urine analysis:    Component Value Date/Time   COLORURINE STRAW (A) 12/18/2023 2000   APPEARANCEUR CLEAR 12/18/2023 2000   LABSPEC 1.008 12/18/2023 2000   PHURINE 6.0 12/18/2023 2000   GLUCOSEU NEGATIVE 12/18/2023 2000   HGBUR NEGATIVE 12/18/2023 2000   BILIRUBINUR NEGATIVE 12/18/2023 2000   KETONESUR 20 (A) 12/18/2023 2000   PROTEINUR NEGATIVE 12/18/2023 2000   NITRITE NEGATIVE 12/18/2023 2000   LEUKOCYTESUR TRACE (A) 12/18/2023 2000   Sepsis Labs: Invalid input(s): "PROCALCITONIN", "LACTICIDVEN"  Microbiology: Recent Results (from the past 240 hours)  Surgical PCR screen     Status: None   Collection Time: 12/21/23  2:00 AM   Specimen: Nasal Mucosa; Nasal Swab  Result Value Ref Range Status   MRSA, PCR NEGATIVE NEGATIVE Final   Staphylococcus aureus  NEGATIVE NEGATIVE Final  Comment: (NOTE) The Xpert SA Assay (FDA approved for NASAL specimens in patients 28 years of age and older), is one component of a comprehensive surveillance program. It is not intended to diagnose infection nor to guide or monitor treatment. Performed at [facility], [address], [place], [place] [address]     Radiology Studies: No results found.     Demetris Capell T. Olevia Westervelt [facility]  If 7PM-7AM, please contact night-coverage [email or web address] 12/26/2023, 1:16 PM

## 2023-12-26 NOTE — Plan of Care (Signed)
  Problem: Education: Goal: Knowledge of disease or condition will improve Outcome: Progressing Goal: Knowledge of secondary prevention will improve (MUST DOCUMENT ALL) Outcome: Progressing Goal: Knowledge of patient specific risk factors will improve (DELETE if not current risk factor) Outcome: Progressing   Problem: Ischemic Stroke/TIA Tissue Perfusion: Goal: Complications of ischemic stroke/TIA will be minimized Outcome: Progressing   Problem: Coping: Goal: Will verbalize positive feelings about self Outcome: Progressing Goal: Will identify appropriate support needs Outcome: Progressing   Problem: Health Behavior/Discharge Planning: Goal: Ability to manage health-related needs will improve Outcome: Progressing Goal: Goals will be collaboratively established with patient/family Outcome: Progressing   Problem: Self-Care: Goal: Ability to participate in self-care as condition permits will improve Outcome: Progressing Goal: Verbalization of feelings and concerns over difficulty with self-care will improve Outcome: Progressing Goal: Ability to communicate needs accurately will improve Outcome: Progressing   Problem: Nutrition: Goal: Risk of aspiration will decrease Outcome: Progressing Goal: Dietary intake will improve Outcome: Progressing   Problem: Education: Goal: Knowledge of General Education information will improve Description: Including pain rating scale, medication(s)/side effects and non-pharmacologic comfort measures Outcome: Progressing   Problem: Health Behavior/Discharge Planning: Goal: Ability to manage health-related needs will improve Outcome: Progressing   Problem: Clinical Measurements: Goal: Ability to maintain clinical measurements within normal limits will improve Outcome: Progressing Goal: Will remain free from infection Outcome: Progressing Goal: Diagnostic test results will improve Outcome: Progressing Goal: Respiratory complications will  improve Outcome: Progressing Goal: Cardiovascular complication will be avoided Outcome: Progressing   Problem: Activity: Goal: Risk for activity intolerance will decrease Outcome: Progressing   Problem: Nutrition: Goal: Adequate nutrition will be maintained Outcome: Progressing   Problem: Coping: Goal: Level of anxiety will decrease Outcome: Progressing   Problem: Elimination: Goal: Will not experience complications related to bowel motility Outcome: Progressing Goal: Will not experience complications related to urinary retention Outcome: Progressing   Problem: Pain Managment: Goal: General experience of comfort will improve and/or be controlled Outcome: Progressing   Problem: Safety: Goal: Ability to remain free from injury will improve Outcome: Progressing   Problem: Skin Integrity: Goal: Risk for impaired skin integrity will decrease Outcome: Progressing   Problem: Education: Goal: Knowledge of the prescribed therapeutic regimen will improve Outcome: Progressing Goal: Understanding of discharge needs will improve Outcome: Progressing Goal: Individualized Educational Video(s) Outcome: Progressing   Problem: Activity: Goal: Ability to avoid complications of mobility impairment will improve Outcome: Progressing Goal: Ability to tolerate increased activity will improve Outcome: Progressing   Problem: Clinical Measurements: Goal: Postoperative complications will be avoided or minimized Outcome: Progressing   Problem: Pain Management: Goal: Pain level will decrease with appropriate interventions Outcome: Progressing   Problem: Skin Integrity: Goal: Will show signs of wound healing Outcome: Progressing

## 2023-12-26 NOTE — TOC Progression Note (Signed)
 Transition of Care [facility]) - Progression Note    Patient Details  Name: [name] MRN: [id number] Date of Birth: 1941-04-25  Transition of Care [facility]) CM/SW Contact  Jonathan Neighbor, RN Phone Number: 12/26/2023, 10:48 AM  Clinical Narrative:     Pt's daughter says they have decided on CIR. Pt will need insurance auth prior to admission. TOC following.   Expected Discharge Plan: Skilled Nursing Facility Barriers to Discharge: Continued Medical Work up  Expected Discharge Plan and Services In-house Referral: Clinical Social Work Discharge Planning Services: CM Consult Post Acute Care Choice: Skilled Nursing Facility Living arrangements for the past 2 months: Single Family Home                                       Social Determinants of Health (SDOH) Interventions SDOH Screenings   Food Insecurity: No Food Insecurity (12/18/2023)  Housing: Low Risk  (12/18/2023)  Transportation Needs: No Transportation Needs (12/18/2023)  Utilities: Not At Risk (12/18/2023)  Social Connections: Socially Isolated (12/18/2023)  Tobacco Use: Medium Risk (12/21/2023)    Readmission Risk Interventions    11/21/2023   10:23 AM  Readmission Risk Prevention Plan  Post Dischage Appt Complete  Medication Screening Complete  Transportation Screening Complete

## 2023-12-26 NOTE — Progress Notes (Signed)
 Orthopaedic Trauma Progress Note  SUBJECTIVE: Patient doing ok today.  Confused as to where she is currently.  Does note she is not having any pain at rest.  Has not required any IV or oral narcotics postoperatively.  Was able to ambulate in the hallway with therapies earlier today.  No chest pain. No SOB. No nausea/vomiting. No other complaints.  Patient's daughter at bedside  OBJECTIVE:  Vitals:   12/26/23 0333 12/26/23 1106  BP: (!) 148/70 130/66  Pulse: 82 77  Resp:  16  Temp: 98.6 F (37 C) (!) 97.5 F (36.4 C)  SpO2: 96% 97%    Opiates Today (MME): Today's  total administered Morphine  Milligram Equivalents: 0 Opiates Yesterday (MME): Yesterday's total administered Morphine  Milligram Equivalents: 0  General: Sitting up in bedside chair, no acute distress Respiratory: No increased work of breathing.  Operative Extremity (RLE): TED hose not currently in place.  Dressing to the anterior hip is clean, dry, intact.  Notable swelling and bruising of the posterior lateral hip as expected.  No significant tenderness with palpation of the hip or throughout the thigh.  No calf tenderness.  Ankle dorsiflexion/plantarflexion intact.  Endorses sensation light touch over all aspects of the foot. + DP pulse  IMAGING: Stable post op imaging.   LABS:  Results for orders placed or performed during the hospital encounter of 12/18/23 (from the past 24 hours)  Osmolality, urine     Status: None   Collection Time: 12/25/23  1:40 PM  Result Value Ref Range   Osmolality, Ur 388 300 - 900 mOsm/kg  Sodium, urine, random     Status: None   Collection Time: 12/25/23  1:40 PM  Result Value Ref Range   Sodium, Ur 97 mmol/L  Magnesium      Status: None   Collection Time: 12/26/23  5:52 AM  Result Value Ref Range   Magnesium  2.1 1.7 - 2.4 mg/dL  CBC     Status: Abnormal   Collection Time: 12/26/23  5:52 AM  Result Value Ref Range   WBC 9.0 4.0 - 10.5 K/uL   RBC 3.68 (L) 3.87 - 5.11 MIL/uL    Hemoglobin 11.0 (L) 12.0 - 15.0 g/dL   HCT 78.2 (L) 95.6 - 21.3 %   MCV 87.2 80.0 - 100.0 fL   MCH 29.9 26.0 - 34.0 pg   MCHC 34.3 30.0 - 36.0 g/dL   RDW 08.6 57.8 - 46.9 %   Platelets 310 150 - 400 K/uL   nRBC 0.0 0.0 - 0.2 %  Renal function panel     Status: Abnormal   Collection Time: 12/26/23  5:52 AM  Result Value Ref Range   Sodium 129 (L) 135 - 145 mmol/L   Potassium 4.2 3.5 - 5.1 mmol/L   Chloride 100 98 - 111 mmol/L   CO2 25 22 - 32 mmol/L   Glucose, Bld 109 (H) 70 - 99 mg/dL   BUN 13 8 - 23 mg/dL   Creatinine, Ser 6.29 0.44 - 1.00 mg/dL   Calcium  8.5 (L) 8.9 - 10.3 mg/dL   Phosphorus 3.0 2.5 - 4.6 mg/dL   Albumin  2.5 (L) 3.5 - 5.0 g/dL   GFR, Estimated >52 >84 mL/min   Anion gap 4 (L) 5 - 15    ASSESSMENT: [name] is a 83 y.o. female, 5 Days Post-Op s/p fall in the hospital Procedures: LEFT TOTAL HIP ARTHROPLASTY, ANTERIOR APPROACH for fracture   CV/Blood loss: Hemoglobin 11.0 this morning, stable.  Hemodynamically stable  PLAN: Weightbearing: WBAT RLE ROM: Unrestricted ROM.  No hip precautions needed. Incisional and dressing care: Dressings left intact until follow-up  Showering: Okay to shower.  Aquacel dressing may get wet Orthopedic device(s): None  Pain management:  1. Tylenol  650 mg q 6 hours scheduled 2. Oxycodone  2.5-5 mg q 8 hours PRN VTE prophylaxis: Plavix and aspirin  currently on hold.  Continue TED hose and SCDs ID:  Ancef  2gm post op completed Foley/Lines:  No foley, KVO IVFs Impediments to Fracture Healing: Vitamin D level 41, no supplementation needed Dispo: PT/OT evaluation ongoing, currently recommending CIR.  Patient's family in agreement.  Okay for discharge from ortho standpoint once cleared by medicine team and therapies  D/C recommendations: - Oxycodone  as needed, Tylenol  for pain control - Continue home dose aspirin /Plavix for DVT prophylaxis once cleared by GI - No additional need for Vit D supplementation  Follow - up plan: 2  weeks after d/c with Dr. Curtiss Grant for wound check and repeat x-rays   Contact information:  Brandy Pane MD, Brandy [place] PA-C. After hours and holidays please check [email or web address] for group call information for [facility]   Brandy Gordon, PA-C [phone] (office) [email or web address]

## 2023-12-26 NOTE — Progress Notes (Signed)
 Occupational Therapy Treatment Patient Details Name: [name] MRN: [id number] DOB: 08/19/1940 Today's Date: 12/26/2023   History of present illness 83 y.o. female presents to [facility] 12/18/23 with intermittent confusion. Brain MRI showed small punctate L frontal CVA. Pt with fall while admitted leading to R femoral neck fx, s/p R THA on 12/21/23. PMHx: COPD, osteoporosis, severe aortic stenosis status post TAVR, diagnosed mild Alzheimer's and vascular dementia   OT comments  Pt remains confused, needing verbal and gestural cues to help her sequence bed and OOB mobility using RW. Multi modal cues also needed for pt to initiate and sequence UBD without OT assist + Mod A from OT. Pt was able to progress gait to hallway with Mod A +2 using RW, demonstrates impaired coordination with RLE during gait. Pt making good progress from last therapy session, c/o little hip pain. OT to continue to progress pt as able. Patient has the potential to reach Mod I and demos the ability to tolerate 3 hours of therapy. Pt would benefit from an intensive rehab program to help maximize functional independence.        If plan is discharge home, recommend the following:  Assistance with cooking/housework;Direct supervision/assist for medications management;Direct supervision/assist for financial management;Assist for transportation;Help with stairs or ramp for entrance;A lot of help with walking and/or transfers;A lot of help with bathing/dressing/bathroom   Equipment Recommendations  Tub/shower seat;Wheelchair (measurements OT);BSC/3in1;Wheelchair cushion (measurements OT)    Recommendations for Other Services      Precautions / Restrictions Precautions Precautions: Fall Recall of Precautions/Restrictions: Impaired Precaution/Restrictions Comments: Alz/Dementia at baseline, fear of falling. Watch HR Restrictions Weight Bearing Restrictions Per Provider Order: Yes RLE Weight Bearing Per Provider Order: Weight bearing  as tolerated       Mobility Bed Mobility Overal bed mobility: Needs Assistance Bed Mobility: Supine to Sit     Supine to sit: Mod assist     General bed mobility comments: Mod A to scoot hips forward and for trunk elevation. Therapist assisting with RLE management and cues for pt to shift LLE during bed mobility    Transfers Overall transfer level: Needs assistance Equipment used: 2 person hand held assist, Rolling walker (2 wheels) Transfers: Sit to/from Stand, Bed to chair/wheelchair/BSC Sit to Stand: Mod assist, +2 physical assistance, +2 safety/equipment     Step pivot transfers: Mod assist, +2 physical assistance, +2 safety/equipment     General transfer comment: Cues for hand placement and upright standing posture. +2 Mod A to power up from bed and BSC. RLE uncoordinated with gait.     Balance Overall balance assessment: Needs assistance, History of Falls Sitting-balance support: No upper extremity supported, Feet supported Sitting balance-Leahy Scale: Fair Sitting balance - Comments: sitting EOB with CGA   Standing balance support: Bilateral upper extremity supported, During functional activity Standing balance-Leahy Scale: Poor Standing balance comment: reliant on external support                           ADL either performed or assessed with clinical judgement   ADL Overall ADL's : Needs assistance/impaired                 Upper Body Dressing : Moderate assistance;Sitting;Cueing for sequencing Upper Body Dressing Details (indicate cue type and reason): doff/don gown; Pt needing cues for her to initiate task without OT assist and to sequence doffing shirt.   Lower Body Dressing Details (indicate cue type and reason): remains total A  for socks Toilet Transfer: Moderate assistance;+2 for physical assistance;+2 for safety/equipment;BSC/3in1 Toilet Transfer Details (indicate cue type and reason): step pivot to [facility], cues for hand placement  safety-reaching back to sit. Toileting- Clothing Manipulation and Hygiene: Maximal assistance;Sit to/from stand Toileting - Clothing Manipulation Details (indicate cue type and reason): rear pericare assist needed from therapist.     Functional mobility during ADLs: Moderate assistance;+2 for safety/equipment;Rolling walker (2 wheels)      Extremity/Trunk Assessment Upper Extremity Assessment Upper Extremity Assessment: Generalized weakness (Pt able to use BUEs to engage in functional tasks for grooming/dressing. ROM WFL bilaterally, MMT not assessed today.)            Vision       Perception Perception Perception: Not tested   Praxis Praxis Praxis: Impaired Praxis Impairment Details: Initiation   Communication Communication Communication: No apparent difficulties   Cognition Arousal: Alert Behavior During Therapy: Flat affect, Anxious Cognition: History of cognitive impairments, Cognition impaired   Orientation impairments: Situation, Place, Time Awareness: Intellectual awareness impaired, Online awareness impaired Memory impairment (select all impairments): Short-term memory, Working [employer] fast streamer, Conservation officer, historic buildings Attention impairment (select first level of impairment): Sustained attention, Selective attention Executive functioning impairment (select all impairments): Organization, Reasoning, Problem solving, Sequencing, Initiation OT - Cognition Comments: Pt needing gestural cues to faciliate initation in functional mobility and ADLs, pt not able to recall events of therapy session at the end. Continued to reorient patient as she reports that she does not know why she is here (at the hospital) or what she is doing. Explained to pt her current situation and why, no carryover                 Following commands: Impaired Following commands impaired: Follows one step commands inconsistently, Follows one step commands with increased time      Cueing   Cueing  Techniques: Verbal cues, Gestural cues, Tactile cues, Visual cues  Exercises      Shoulder Instructions       General Comments HR up to 127bpm during ambulation    Pertinent Vitals/ Pain       Pain Assessment Pain Assessment: Faces Faces Pain Scale: Hurts a little bit Pain Location: R hip with bed mobility Pain Descriptors / Indicators: Discomfort, Sore, Guarding Pain Intervention(s): Monitored during session, Other (comment) (denies pain at end of session once positioned in recliner)  Home Living                                          Prior Functioning/Environment              Frequency  Min 2X/week        Progress Toward Goals  OT Goals(current goals can now be found in the care plan section)  Progress towards OT goals: Progressing toward goals  Acute Rehab OT Goals Patient Stated Goal: To get better, back to baseline OT Goal Formulation: With patient/family Time For Goal Achievement: 01/05/24 Potential to Achieve Goals: Good  Plan      Co-evaluation    PT/OT/SLP Co-Evaluation/Treatment: Yes Reason for Co-Treatment: Necessary to address cognition/behavior during functional activity;To address functional/ADL transfers;For patient/therapist safety PT goals addressed during session: Mobility/safety with mobility;Balance;Strengthening/ROM OT goals addressed during session: ADL's and self-care      AM-PAC OT "6 Clicks" Daily Activity     Outcome Measure   Help from another person eating meals?:  A Little Help from another person taking care of personal grooming?: A Little Help from another person toileting, which includes using toliet, bedpan, or urinal?: A Lot Help from another person bathing (including washing, rinsing, drying)?: A Lot Help from another person to put on and taking off regular upper body clothing?: A Lot Help from another person to put on and taking off regular lower body clothing?: Total 6 Click Score: 13    End of  Session Equipment Utilized During Treatment: Gait belt;Rolling walker (2 wheels)  OT Visit Diagnosis: Unsteadiness on feet (R26.81);Repeated falls (R29.6);History of falling (Z91.81);Other symptoms and signs involving the nervous system (R29.898);Other symptoms and signs involving cognitive function;Other abnormalities of gait and mobility (R26.89);Pain Pain - Right/Left: Right Pain - part of body: Hip   Activity Tolerance Patient tolerated treatment well   Patient Left in chair;with call bell/phone within reach;with chair alarm set;with family/visitor present   Nurse Communication Need for lift equipment;Precautions (+2 pivot to/from. Can use RW or HHA.)        Time: 9562-1308 OT Time Calculation (min): 32 min  Charges: OT General Charges $OT Visit: 1 Visit OT Treatments $Self Care/Home Management : 8-22 mins  12/26/2023  AB, OTR/L  Acute Rehabilitation Services  Office: [phone]   Jorene New 12/26/2023, 12:26 PM

## 2023-12-26 NOTE — Evaluation (Signed)
 Speech Language Pathology Evaluation Patient Details Name: [name] MRN: [id number] DOB: 04/16/1941 Today's Date: 12/26/2023 Time: 1650-1705 SLP Time Calculation (min) (ACUTE ONLY): 15 min  Problem List:  Patient Active Problem List   Diagnosis Date Noted   Vision loss of right eye 12/26/2023   Hematochezia 12/26/2023   Abnormal finding on GI tract imaging 12/26/2023   ABLA (acute blood loss anemia) 12/26/2023   Preop cardiovascular exam 12/20/2023   Closed subcapital fracture of neck of right femur, initial encounter (HCC) 12/20/2023   Cerebrovascular accident (CVA) (HCC) 12/18/2023   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   Severe aortic stenosis 11/08/2023   Cognitive impairment 05/25/2020   COVID-19 virus infection 05/25/2020   Hypothyroidism 02/21/2018   Hypertension 02/21/2018   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 Abel Hoe & Dr. Honey Lusty   Scoliosis    Severe aortic stenosis    Wears glasses    Past Surgical History:  Past Surgical History:  Procedure Laterality Date   CESAREAN SECTION  07/31/1976   TWINS, ONE WAS STILLBORN   INTRAOPERATIVE TRANSTHORACIC ECHOCARDIOGRAM N/A 11/20/2023   Procedure: ECHOCARDIOGRAM, TRANSTHORACIC;  Surgeon: Odie Benne, MD;  Location: [facility]  INVASIVE CV LAB;  Service: Cardiovascular;  Laterality: N/A;   MOUTH SURGERY  07/31/1994   tongue resection   NODULE REMOVED     BENIGN, LOWER NECK   RIGHT HEART CATH AND CORONARY ANGIOGRAPHY N/A 09/21/2023   Procedure: RIGHT HEART CATH AND CORONARY ANGIOGRAPHY;  Surgeon: Odie Benne, MD;  Location: [facility] INVASIVE CV LAB;  Service: Cardiovascular;  Laterality: N/A;   TOTAL HIP ARTHROPLASTY Right 12/21/2023   Procedure: ARTHROPLASTY, HIP, TOTAL, ANTERIOR APPROACH;  Surgeon: Laneta Pintos, MD;  Location: [facility] OR;  Service: Orthopedics;  Laterality: Right;  HEMI HIP   HPI:  Patient is an 83 y.o. female with PMH: Alzheimer's and vascular dementia, COPD, osteoporosis, severe aortic stenosis s/p TAVR. She was admitted to [facility] on 12/21/23 with intermittent confusion. At baseline, she lives with her daughter but is able to complete basic level ADL's and is home alone during the day while daughter is at work. MRI brain showed small punctate left frontal CVA. Patient with  fall while admitted, resulting in right femoral neck fracture s/p THA on 12/21/23.   Assessment / Plan / Recommendation Clinical Impression  Patient presents with impaired cognitive-linguistic function as per this evaluation. Daughter reported that although patient did have memory impairment prior and she did exhibit sundowning behaviors ("5:30 every evening"), patient is not at her baseline. Daughter denied any instances of patient wandering, agitation, etc. prior to this hospitalization. Patient does not currently drive but she was able to complete ADL's including making simple meals. Daughter lived with her but patient alone during the day when daughter at  work. Patient was awake, alert,, pleasant during evaluation. She was oriented to place and month but expressed confusion about why she is here. Pragmatically, she did not engage or initiate in conversation much unless SLP directly spoke with her. She exhibited some odd behaviors  such as reaching out seemingly to shake SLP's hand, but instead touching the back of her hand to back of SLP's hand. Daughter indicated that she thinks a combination of her dementia and recent anasthesia is contributing to her impaired behaviors. SLP discussed hospital delirium as well as dementia behaviors likely exacerbated with her being in unfamiliar environment and not able to complete her daily routine. Daughter is understanding and although she is hopeful that patient will be able to return home, she seems to be understanding that patient will not be able to be left home alone. SLP will continue to follow while admitted and recommending SLP at next venue of care as well.    SLP Assessment  SLP Recommendation/Assessment: Patient needs continued Speech Lanaguage Pathology Services SLP Visit Diagnosis: Cognitive communication deficit (R41.841)    Recommendations for follow up therapy are one component of a multi-disciplinary discharge planning process, led by the attending physician.  Recommendations may be updated based on patient status, additional functional criteria and insurance authorization.    Follow Up Recommendations  Acute inpatient rehab (3hours/day)    Assistance Recommended at Discharge  Frequent or constant Supervision/Assistance  Functional Status Assessment Patient has had a recent decline in their functional status and demonstrates the ability to make significant improvements in function in a reasonable and predictable amount of time.  Frequency and Duration min 1 x/week  1 week      SLP Evaluation Cognition  Overall Cognitive Status: Difficult to assess Arousal/Alertness: Awake/alert Orientation Level: Oriented to person;Oriented to place;Other (comment) (oriented to month) Memory: Impaired Memory Impairment: Storage deficit;Decreased recall of new information Awareness: Impaired Awareness Impairment: Intellectual impairment       Comprehension  Auditory  Comprehension Overall Auditory Comprehension: Impaired Conversation: Simple Interfering Components: Attention;Processing speed;Working [employer] fast streamer    Expression Expression Primary Mode of Expression: Verbal Verbal Expression Overall Verbal Expression: Impaired Naming: Not tested Pragmatics: Impairment Impairments: Abnormal affect Interfering Components: Attention;Premorbid deficit   Oral / Motor  Oral Motor/Sensory Function Overall Oral Motor/Sensory Function: Other (comment) (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

## 2023-12-26 NOTE — Consult Note (Signed)
 Physical Medicine and Rehabilitation Consult Reason for Consult:Closed subcapital fracture of neck of right femur Referring Physician: Carry Clapper, MD   HPI: [name] is a 83 y.o. female who presented to [facility] on 12/18/23 with intermittent confusion. Brain MRI showed a small punctate L frontal CVA. She had a fall while admitted that lead of a right femoral neck fracture, s/p R THA on 12/21/23. PMH includes COPD, osteoporosis, severe aortic stenosis status post TAVR, mild Alzheimer's disease, and vascular dementia.  ROS: +right hip pain  Home: Home Living Family/patient expects to be discharged to:: Private residence Living Arrangements: Children (daughter) Available Help at Discharge: Family, Available PRN/intermittently Type of Home: House Home Access: Stairs to enter Secretary/administrator of Steps: 3 Entrance Stairs-Rails: None Home Layout: One level Bathroom Shower/Tub: Engineer, manufacturing systems: Standard Home Equipment: None, Other (comment) (cameras inside the house for daughter to make sure mom is ok while shes at work) Additional Comments: daughter works during the day,  Functional History: Prior Function Prior Level of Function : Independent/Modified Independent, History of Falls (last six months) Mobility Comments: Ind with no AD, will furniture surf, likes to walk to [employer] and get mail. and likes to go on walks around the neighborhood with daughter. Multiple recent falls ADLs Comments: does not drive, typically does small cooking and cleaning and showers standing up, daughter manages medicines and finanaces Functional Status:  Mobility: Bed Mobility Overal bed mobility: Needs Assistance Bed Mobility: Supine to Sit, Sit to Supine Rolling: Max assist Sidelying to sit: Max assist, Used rails, HOB elevated Supine to sit: +2 for physical assistance, Max assist Sit to supine: +2 for physical assistance, Max assist General bed mobility comments: +2 Max A via  helicopter as pt was not following commands. Transfers Overall transfer level: Needs assistance Equipment used: 2 person hand held assist Transfers: Sit to/from Stand Sit to Stand: Mod assist, +2 physical assistance, +2 safety/equipment Bed to/from chair/wheelchair/BSC transfer type:: Step pivot Step pivot transfers: Mod assist, +2 safety/equipment, +2 physical assistance General transfer comment: +2 Mod A to stand and take sidesteps along EOB. Nursing staff requested pt be left in bed due to pt recently trying to get out of bed. Ambulation/Gait Ambulation/Gait assistance: Min assist, +2 safety/equipment Gait Distance (Feet): 120 Feet (x60 with 2HH, x60 with RW) Assistive device: Rolling walker (2 wheels), 2 person hand held assist Gait Pattern/deviations: Step-through pattern, Drifts right/left, Trunk flexed, Narrow base of support, Decreased stride length General Gait Details: Deferred Gait velocity: decr    ADL: ADL Overall ADL's : Needs assistance/impaired Eating/Feeding: Set up, Bed level Eating/Feeding Details (indicate cue type and reason): meal tray in room, Pt reports self-feeding- not witnessed Grooming: Sitting, Contact guard assist Grooming Details (indicate cue type and reason): sink level Upper Body Bathing: Minimal assistance, Sitting Lower Body Bathing: Moderate assistance, Sitting/lateral leans Lower Body Bathing Details (indicate cue type and reason): knees down Upper Body Dressing : Sitting, Minimal assistance, Cueing for sequencing Upper Body Dressing Details (indicate cue type and reason): gown Lower Body Dressing: Total assistance, Sitting/lateral leans, Sit to/from stand Lower Body Dressing Details (indicate cue type and reason): required assist with socks, able to manage her own underwear for toileting Toilet Transfer: Moderate assistance, +2 for physical assistance, +2 for safety/equipment, BSC/3in1, Stand-pivot Toilet Transfer Details (indicate cue type and  reason): simulated with recliner, Bilat HHA Toileting- Clothing Manipulation and Hygiene: Maximal assistance, Sit to/from stand Toileting - Clothing Manipulation Details (indicate cue type and reason): able  to manage peri care with TP and pull up underwear without assist (and panty liner in underwear) Functional mobility during ADLs: Moderate assistance, +2 for safety/equipment, Cueing for sequencing (HHA with pivot transfer) General ADL Comments: needs a lot of reassurance  Cognition: Cognition Orientation Level: Oriented to person Cognition Arousal: Alert Behavior During Therapy: Restless, Impulsive   ROS +right hip pain  Past Medical History:  Diagnosis Date   Cataract    COPD (chronic obstructive pulmonary disease) (HCC)    Glaucoma    Goiter    Hypertension    Hypothyroid    Memory loss    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 Abel Hoe & Dr. Honey Lusty   Scoliosis    Severe aortic stenosis    Wears glasses    Past Surgical History:  Procedure Laterality Date   CESAREAN SECTION  07/31/1976   TWINS, ONE WAS STILLBORN   INTRAOPERATIVE TRANSTHORACIC ECHOCARDIOGRAM N/A 11/20/2023   Procedure: ECHOCARDIOGRAM, TRANSTHORACIC;  Surgeon: Odie Benne, MD;  Location: [facility];  Service: Cardiovascular;  Laterality: N/A;   MOUTH SURGERY  07/31/1994   tongue resection   NODULE REMOVED     BENIGN, LOWER NECK   RIGHT HEART CATH AND CORONARY ANGIOGRAPHY N/A 09/21/2023   Procedure: RIGHT HEART CATH AND CORONARY ANGIOGRAPHY;  Surgeon: Odie Benne, MD;  Location: [facility];  Service: Cardiovascular;  Laterality: N/A;   TOTAL HIP ARTHROPLASTY Right 12/21/2023   Procedure: ARTHROPLASTY, HIP, TOTAL, ANTERIOR APPROACH;  Surgeon: Laneta Pintos, MD;  Location: [facility] OR;  Service:  Orthopedics;  Laterality: Right;  HEMI HIP   Family History  Problem Relation Age of Onset   Thyroid  disease Mother    Dementia Mother    Thyroid  disease Sister    Social History:  reports that she quit smoking about 29 years ago. Her smoking use included cigarettes. She started smoking about 31 years ago. She has a 0.4 pack-year smoking history. She has never used smokeless tobacco. She reports that she does not drink alcohol and does not use drugs. Allergies:  Allergies  Allergen Reactions   Brimonidine Itching    Red eyes   Fluocinolone Acetonide Other (See Comments)    Unable to recall   Naphazoline-Polyethyl Glycol     THAT YOU USE TO DILATE YOUR EYES.Aaron AasMAKES BP GO UP, CAN ONLY USE HALF STRENGTH   Triamcinolone Acetonide Other (See Comments)    Unable to recall   Tropicamide Other (See Comments)    Elevates IOP OK to use 0.5%   Medications Prior to Admission  Medication Sig Dispense Refill   aspirin  81 MG chewable tablet Chew 1 tablet (81 mg total) by mouth daily.     Calcium  Carbonate (CALTRATE 600 PO) Take 1 tablet by mouth daily after lunch.     Cholecalciferol (VITAMIN D3) 10 MCG (400 UNIT) tablet Take 400 Units by mouth daily after lunch.     dorzolamide -timolol  (COSOPT ) 22.3-6.8 MG/ML ophthalmic solution Place 1 drop into the left eye every 12 (twelve) hours.     levothyroxine  (SYNTHROID ) 75 MCG tablet Take 75 mcg by mouth daily after lunch.     LUMIGAN 0.01 % SOLN Place 1 drop into the left eye at bedtime.     memantine  (NAMENDA ) 5 MG tablet TAKE 1  TABLET BY MOUTH EVERYDAY AT BEDTIME 90 tablet 4   metoprolol succinate (TOPROL-XL) 100 MG 24 hr tablet Take 100 mg by mouth daily after lunch.     Multiple Vitamin (MULTIVITAMIN WITH MINERALS) TABS tablet Take 1 tablet by mouth daily after lunch. Centrum Silver     polyvinyl alcohol (ARTIFICIAL TEARS) 1.4 % ophthalmic solution Place 1 drop into both eyes as needed for dry eyes.     prednisoLONE  acetate (PREDNISOLONE  ACETATE  P-F) 1 % ophthalmic suspension Place 1 drop into the right eye in the morning.     rosuvastatin  (CRESTOR ) 5 MG tablet Take 5 mg by mouth daily after lunch.       Blood pressure (!) 148/70, pulse 82, temperature 98.6 F (37 C), temperature source Axillary, resp. rate 17, height 5\' 6"  (1.676 m), weight 60.1 kg, SpO2 96%. Physical Exam Gen: no distress, normal appearing HEENT: oral mucosa pink and moist, NCAT Cardio: Reg rate Chest: normal effort, normal rate of breathing Abd: soft, non-distended Ext: no edema Skin: intact Neuro: Alert, impulsive, impaired cognition, follows 1 step commands, delayed processing MSK: RLE mobility is limited by pain  Results for orders placed or performed during the hospital encounter of 12/18/23 (from the past 24 hours)  Osmolality, urine     Status: None   Collection Time: 12/25/23  1:40 PM  Result Value Ref Range   Osmolality, Ur 388 300 - 900 mOsm/kg  Sodium, urine, random     Status: None   Collection Time: 12/25/23  1:40 PM  Result Value Ref Range   Sodium, Ur 97 mmol/L  Magnesium      Status: None   Collection Time: 12/26/23  5:52 AM  Result Value Ref Range   Magnesium  2.1 1.7 - 2.4 mg/dL  CBC     Status: Abnormal   Collection Time: 12/26/23  5:52 AM  Result Value Ref Range   WBC 9.0 4.0 - 10.5 K/uL   RBC 3.68 (L) 3.87 - 5.11 MIL/uL   Hemoglobin 11.0 (L) 12.0 - 15.0 g/dL   HCT 40.9 (L) 81.1 - 91.4 %   MCV 87.2 80.0 - 100.0 fL   MCH 29.9 26.0 - 34.0 pg   MCHC 34.3 30.0 - 36.0 g/dL   RDW 78.2 95.6 - 21.3 %   Platelets 310 150 - 400 K/uL   nRBC 0.0 0.0 - 0.2 %  Renal function panel     Status: Abnormal   Collection Time: 12/26/23  5:52 AM  Result Value Ref Range   Sodium 129 (L) 135 - 145 mmol/L   Potassium 4.2 3.5 - 5.1 mmol/L   Chloride 100 98 - 111 mmol/L   CO2 25 22 - 32 mmol/L   Glucose, Bld 109 (H) 70 - 99 mg/dL   BUN 13 8 - 23 mg/dL   Creatinine, Ser 0.86 0.44 - 1.00 mg/dL   Calcium  8.5 (L) 8.9 - 10.3 mg/dL   Phosphorus 3.0  2.5 - 4.6 mg/dL   Albumin  2.5 (L) 3.5 - 5.0 g/dL   GFR, Estimated >57 >84 mL/min   Anion gap 4 (L) 5 - 15   CT ANGIO GI BLEED Result Date: 12/25/2023 CLINICAL DATA:  Right red blood per rectum EXAM: CTA ABDOMEN AND PELVIS WITHOUT AND WITH CONTRAST TECHNIQUE: Multidetector CT imaging of the abdomen and pelvis was performed using the standard protocol during bolus administration of intravenous contrast. Multiplanar reconstructed images and MIPs were obtained and reviewed to evaluate the vascular anatomy. RADIATION DOSE REDUCTION: This exam was performed  according to the departmental dose-optimization program which includes automated exposure control, adjustment of the mA and/or kV according to patient size and/or use of iterative reconstruction technique. CONTRAST:  OMNIPAQUE  IOHEXOL  350 MG/ML SOLN COMPARISON:  None Available. FINDINGS: VASCULAR Aorta: Normal caliber aorta without aneurysm, dissection, vasculitis or significant stenosis. Celiac: Patent without evidence of aneurysm, dissection, vasculitis or significant stenosis. SMA: Patent without evidence of aneurysm, dissection, vasculitis or significant stenosis. Renals: Both renal arteries are patent without evidence of aneurysm, dissection, vasculitis, fibromuscular dysplasia or significant stenosis. IMA: Patent without evidence of aneurysm, dissection, vasculitis or significant stenosis. Inflow: Patent without evidence of aneurysm, dissection, vasculitis or significant stenosis. Proximal Outflow: Bilateral common femoral and visualized portions of the superficial and profunda femoral arteries are patent without evidence of aneurysm, dissection, vasculitis or significant stenosis. Veins: Negative Review of the MIP images confirms the above findings. NON-VASCULAR Lower chest: Small amount of right pleural fluid Hepatobiliary: No focal liver abnormality is seen. No gallstones, gallbladder wall thickening, or biliary dilatation. Pancreas: Unremarkable.  No pancreatic ductal dilatation or surrounding inflammatory changes. Spleen: Normal in size without focal abnormality. Adrenals/Urinary Tract: Adrenal glands are unremarkable. Kidneys are normal, without renal calculi, focal lesion, or hydronephrosis. Bladder is unremarkable. Stomach/Bowel: Large stool ball in the rectum. Mild presacral and perirectal stranding. Rectosigmoid diverticulosis. No visible hemorrhage into the colon. No dilated small bowel. Lymphatic: No lymphadenopathy Reproductive: No adnexal mass or free fluid in the pelvis. Other: Gas and edema within the soft tissues anterior to the right hip, likely postsurgical. Musculoskeletal: Recent right hip arthroplasty. No acute osseous findings. Bilateral L5 pars interarticularis defects IMPRESSION: 1. No visible hemorrhage into the colon. 2. Large stool ball in the rectum with mild presacral and perirectal stranding, which may indicate stercoral colitis. 3. Recent right hip arthroplasty with gas and edema within the soft tissues anterior to the right hip, likely postsurgical. 4. Small amount of right pleural fluid. Electronically Signed   By: Juanetta Nordmann M.D.   On: 12/25/2023 02:48    Assessment/Plan: Diagnosis: Closed subcapital fracture of neck of right femur Does the need for close, 24 hr/day medical supervision in concert with the patient's rehab needs make it unreasonable for this patient to be served in a less intensive setting? Yes Co-Morbidities requiring supervision/potential complications:  1) right hip pain: continue tylenol  2) recent CVA: would benefit from intensive PT, OT, SLP 3) Alzheimer's disease: continue namenda  4) vascular dementia 5) HTN: continue toprol XL 6) Osteoporosis: consider adding vitamin D supplement Due to bladder management, bowel management, safety, skin/wound care, disease management, medication administration, pain management, and patient education, does the patient require 24 hr/day rehab nursing?  Yes Does the patient require coordinated care of a physician, rehab nurse, therapy disciplines of PT, OT, SLP to address physical and functional deficits in the context of the above medical diagnosis(es)? Yes Addressing deficits in the following areas: balance, endurance, locomotion, strength, transferring, bowel/bladder control, bathing, dressing, feeding, grooming, toileting, cognition, and psychosocial support Can the patient actively participate in an intensive therapy program of at least 3 hrs of therapy per day at least 5 days per week? Yes The potential for patient to make measurable gains while on inpatient rehab is excellent Anticipated functional outcomes upon discharge from inpatient rehab are supervision  with PT, supervision with OT, supervision with SLP. Estimated rehab length of stay to reach the above functional goals is: 10-12 days Anticipated discharge destination: Home Overall Rehab/Functional Prognosis: excellent  POST ACUTE RECOMMENDATIONS: This patient's condition is  appropriate for continued rehabilitative care in the following setting: CIR Patient has agreed to participate in recommended program. Yes Note that insurance prior authorization may be required for reimbursement for recommended care.    I have personally performed a face to face diagnostic evaluation of this patient. Additionally, I have examined the patient's medical record including any pertinent labs and radiographic images.    Thanks,  Liam Redhead, MD 12/26/2023

## 2023-12-26 NOTE — Progress Notes (Signed)
 Inpatient Rehab Admissions Coordinator:   Spoke to pt's daughter, [name], on the phone.  Plan for LTC policy through [facility] to help with caregiver support while she is at work.  Family does wish to pursue CIR at this time.  [name] notes pt just ambulated in the hallway with therapy.  I will await their notes to send to [employer] to demonstrate pt's ability to progress.    Loye Rumble, PT, DPT Admissions Coordinator [phone] 12/26/23  11:06 AM

## 2023-12-26 NOTE — Progress Notes (Signed)
 Physical Therapy Treatment Patient Details Name: [name] MRN: [id number] DOB: 29-May-1941 Today's Date: 12/26/2023   History of Present Illness 83 y.o. female presents to [facility] 12/18/23 with intermittent confusion. Brain MRI showed small punctate L frontal CVA. Pt with fall while admitted leading to R femoral neck fx, s/p R THA on 12/21/23. PMHx: COPD, osteoporosis, severe aortic stenosis status post TAVR, diagnosed mild Alzheimer's and vascular dementia    PT Comments  Pt tolerated treatment well today. Co-treat with OT. Pt today was able to progress ambulation. 2 bouts of ambulation. First bout with 2 person HHA to sink. 2nd bout with RW in hallway. Daughter used as visual cue for ambulation distance. Constant cues for sequencing and increasing step length. DC recs updated to [facility]. PT will continue to follow.     If plan is discharge home, recommend the following: A lot of help with walking and/or transfers;A lot of help with bathing/dressing/bathroom;Assistance with cooking/housework;Direct supervision/assist for medications management;Direct supervision/assist for financial management;Assist for transportation;Help with stairs or ramp for entrance   Can travel by private vehicle     No  Equipment Recommendations  Rolling walker (2 wheels)    Recommendations for Other Services Rehab consult     Precautions / Restrictions Precautions Precautions: Fall Recall of Precautions/Restrictions: Impaired Precaution/Restrictions Comments: Alz/Dementia at baseline, fear of falling. Watch HR Restrictions Weight Bearing Restrictions Per Provider Order: Yes RLE Weight Bearing Per Provider Order: Weight bearing as tolerated     Mobility  Bed Mobility Overal bed mobility: Needs Assistance Bed Mobility: Supine to Sit     Supine to sit: Mod assist     General bed mobility comments: Mod A to scoot hips forward and for trunk elevation.    Transfers Overall transfer level: Needs  assistance Equipment used: 2 person hand held assist, Rolling walker (2 wheels) Transfers: Sit to/from Stand Sit to Stand: Mod assist, +2 physical assistance, +2 safety/equipment           General transfer comment: Cues for hand placement. +2 Mod A to power up from bed and BSC.    Ambulation/Gait Ambulation/Gait assistance: +2 physical assistance, +2 safety/equipment, Mod assist Gait Distance (Feet): 35 Feet (10+25) Assistive device: 2 person hand held assist, Rolling walker (2 wheels) Gait Pattern/deviations: Step-through pattern, Drifts right/left, Trunk flexed, Narrow base of support, Decreased stride length Gait velocity: decr     General Gait Details: 2 bouts of ambulation. First bout with 2 person HHA to sink. 2nd bout with RW in hallway. Daughter used as visual cue for ambulation distance. Constant cues for sequencing and increasing step length.   Stairs             Wheelchair Mobility     Tilt Bed    Modified Rankin (Stroke Patients Only) Modified Rankin (Stroke Patients Only) Pre-Morbid Rankin Score: No symptoms Modified Rankin: Moderate disability     Balance Overall balance assessment: Needs assistance, History of Falls Sitting-balance support: No upper extremity supported, Feet supported Sitting balance-Leahy Scale: Fair Sitting balance - Comments: sitting EOB with CGA   Standing balance support: Bilateral upper extremity supported, During functional activity Standing balance-Leahy Scale: Poor Standing balance comment: reliant on external support                            Communication Communication Communication: No apparent difficulties  Cognition Arousal: Alert Behavior During Therapy: Flat affect, Anxious   PT - Cognitive impairments: History of cognitive impairments,  Orientation, Memory, Attention, Sequencing, Problem solving, Safety/Judgement   Orientation impairments: Place, Time, Situation                   PT -  Cognition Comments: Pt repeatly states "I dont know what I am doing or where I am at" Following commands: Impaired Following commands impaired: Follows one step commands inconsistently, Follows one step commands with increased time    Cueing Cueing Techniques: Verbal cues, Gestural cues, Tactile cues  Exercises      General Comments General comments (skin integrity, edema, etc.): HR up to 127 during ambulation.      Pertinent Vitals/Pain Pain Assessment Pain Assessment: Faces Faces Pain Scale: Hurts a little bit Pain Location: R hip with bed mobility Pain Descriptors / Indicators: Discomfort, Sore, Guarding Pain Intervention(s): Monitored during session    Home Living                          Prior Function            PT Goals (current goals can now be found in the care plan section) Progress towards PT goals: Progressing toward goals    Frequency    Min 2X/week      PT Plan      Co-evaluation PT/OT/SLP Co-Evaluation/Treatment: Yes Reason for Co-Treatment: Necessary to address cognition/behavior during functional activity;To address functional/ADL transfers;For patient/therapist safety PT goals addressed during session: Mobility/safety with mobility;Balance;Strengthening/ROM OT goals addressed during session: ADL's and self-care;Strengthening/ROM      AM-PAC PT "6 Clicks" Mobility   Outcome Measure  Help needed turning from your back to your side while in a flat bed without using bedrails?: A Lot Help needed moving from lying on your back to sitting on the side of a flat bed without using bedrails?: A Lot Help needed moving to and from a bed to a chair (including a wheelchair)?: A Lot Help needed standing up from a chair using your arms (e.g., wheelchair or bedside chair)?: A Lot Help needed to walk in hospital room?: A Lot Help needed climbing 3-5 steps with a railing? : Total 6 Click Score: 11    End of Session Equipment Utilized During  Treatment: Gait belt Activity Tolerance: Patient tolerated treatment well Patient left: in chair;with call bell/phone within reach;with chair alarm set;Other (comment) (Left with OT in chair) Nurse Communication: Mobility status PT Visit Diagnosis: Unsteadiness on feet (R26.81);Other abnormalities of gait and mobility (R26.89);Muscle weakness (generalized) (M62.81);History of falling (Z91.81)     Time: 8657-8469 PT Time Calculation (min) (ACUTE ONLY): 28 min  Charges:    $Gait Training: 8-22 mins PT General Charges $$ ACUTE PT VISIT: 1 Visit                     Rodgers Clack, PT, DPT Acute Rehab Services [phone]    Wells Mabe 12/26/2023, 12:04 PM

## 2023-12-26 NOTE — Progress Notes (Addendum)
 Daily Progress Note  DOA: 12/18/2023 Hospital Day: 9   Cc: Rectal bleeding/ stercoral colitis  Brief History:  83 y.o. 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 hypothyroidism , neck fracture status post right total hip arthroplasty 12/21/2018.  Patient admitted with CVA and was started on aspirin  and Plavix.  During this admission she had a fall resulting in a right hip fracture.  She underwent a total right hip arthroplasty.  She had postop anemia which improved with transfusion.  During this admission she developed hematochezia.  See GI consult note from 527  ASSESSMENT    Rectal bleeding on Plavix and aspirin  , likely secondary to stercoral colitis seen on CT angio which also showed a stool ball in the rectum.  Good results with suppository, enema yesterday. No further rectal bleeding. Hgb stable at 11  Acute CVA ( POA).  Plavix and ASA currently on hold   Right femoral fracture 2/2 to fall S/p R total hip arthroplasty 12/22/22.    Aortic stenosis, s/p TAVR April 2025.   Principal Problem:   Closed subcapital fracture of neck of right femur, initial encounter [facility]) Active Problems:   Cerebrovascular accident (CVA) (HCC)   Preop cardiovascular exam   PLAN   --Need to be sure to avoid constipation while inpatient or in rehab. Doesn't generally have issues with constipation at home.  For now recommend she continue twice daily Senokot.  Will add daily fiber supplement.  Also daily Dulcolax suppositories -- Okay from GI standpoint to aspirin  now and Plavix in 3 days  Subjective   Daughter in room.  Patient has no complaints.   Objective   Recent Labs    12/24/23 0609 12/24/23 1844 12/25/23 0311 12/25/23 0839 12/26/23 0552  WBC 7.6  --   --  8.1 9.0  HGB 8.3*   < > 10.3* 11.1* 11.0*  HCT 24.6*   < > 30.2* 32.3* 32.1*  MCV 88.8  --   --  88.3  87.2  PLT 231  --   --  276 310   < > = values in this interval not displayed.   Recent Labs    12/24/23 1323  FOLATE 20.0  VITAMINB12 1,348*  FERRITIN 127  TIBC 232*  IRONPCTSAT 6*   Recent Labs    12/24/23 0609 12/25/23 0311 12/26/23 0552  NA 130* 130* 129*  K 3.9 4.0 4.2  CL 100 101 100  CO2 23 25 25   GLUCOSE 113* 111* 109*  BUN 13 14 13   CREATININE 0.81 0.82 0.87  CALCIUM  8.2* 8.4* 8.5*   Recent Labs    12/26/23 0552  ALBUMIN  2.5*    Imaging:  CT ANGIO GI BLEED CLINICAL DATA:  Right red blood per rectum  EXAM: CTA ABDOMEN AND PELVIS WITHOUT AND WITH CONTRAST  TECHNIQUE: Multidetector CT imaging of the abdomen and pelvis was performed using the standard protocol during bolus administration of intravenous contrast. Multiplanar reconstructed images and MIPs were obtained and reviewed to evaluate the vascular anatomy.  RADIATION DOSE REDUCTION: This exam was performed according to the departmental dose-optimization program which includes automated exposure control, adjustment of the mA and/or kV according to patient size and/or use of iterative reconstruction technique.  CONTRAST:  OMNIPAQUE  IOHEXOL  350 MG/ML SOLN  COMPARISON:  None Available.  FINDINGS: VASCULAR  Aorta: Normal caliber aorta without aneurysm, dissection,  vasculitis or significant stenosis.  Celiac: Patent without evidence of aneurysm, dissection, vasculitis or significant stenosis.  SMA: Patent without evidence of aneurysm, dissection, vasculitis or significant stenosis.  Renals: Both renal arteries are patent without evidence of aneurysm, dissection, vasculitis, fibromuscular dysplasia or significant stenosis.  IMA: Patent without evidence of aneurysm, dissection, vasculitis or significant stenosis.  Inflow: Patent without evidence of aneurysm, dissection, vasculitis or significant stenosis.  Proximal Outflow: Bilateral common femoral and visualized portions of the  superficial and profunda femoral arteries are patent without evidence of aneurysm, dissection, vasculitis or significant stenosis.  Veins: Negative  Review of the MIP images confirms the above findings.  NON-VASCULAR  Lower chest: Small amount of right pleural fluid  Hepatobiliary: No focal liver abnormality is seen. No gallstones, gallbladder wall thickening, or biliary dilatation.  Pancreas: Unremarkable. No pancreatic ductal dilatation or surrounding inflammatory changes.  Spleen: Normal in size without focal abnormality.  Adrenals/Urinary Tract: Adrenal glands are unremarkable. Kidneys are normal, without renal calculi, focal lesion, or hydronephrosis. Bladder is unremarkable.  Stomach/Bowel: Large stool ball in the rectum. Mild presacral and perirectal stranding. Rectosigmoid diverticulosis. No visible hemorrhage into the colon. No dilated small bowel.  Lymphatic: No lymphadenopathy  Reproductive: No adnexal mass or free fluid in the pelvis.  Other: Gas and edema within the soft tissues anterior to the right hip, likely postsurgical.  Musculoskeletal: Recent right hip arthroplasty. No acute osseous findings. Bilateral L5 pars interarticularis defects  IMPRESSION: 1. No visible hemorrhage into the colon. 2. Large stool ball in the rectum with mild presacral and perirectal stranding, which may indicate stercoral colitis. 3. Recent right hip arthroplasty with gas and edema within the soft tissues anterior to the right hip, likely postsurgical. 4. Small amount of right pleural fluid.  Electronically Signed   By: Juanetta Nordmann M.D.   On: 12/25/2023 02:48     Scheduled inpatient medications:   sodium chloride    Intravenous Once   acetaminophen   650 mg Oral Q6H WA   bisacodyl  10 mg Rectal Daily   dorzolamide -timolol   1 drop Left Eye Q12H   feeding supplement  237 mL Oral BID BM   latanoprost   1 drop Left Eye QHS   levothyroxine   75 mcg Oral Q0600   memantine    5 mg Oral QHS   metoprolol succinate  100 mg Oral QPC lunch   multivitamin with minerals  1 tablet Oral QPC lunch   pantoprazole (PROTONIX) IV  40 mg Intravenous Q12H   prednisoLONE  acetate  1 drop Right Eye q AM   rosuvastatin   5 mg Oral QPC lunch   senna-docusate  1 tablet Oral BID   sodium chloride  flush  3-10 mL Intravenous Q12H   sodium chloride   1 g Oral BID WC   Continuous inpatient infusions:  PRN inpatient medications: haloperidol lactate, hydrALAZINE , HYDROcodone-acetaminophen , lip balm, melatonin, menthol-cetylpyridinium **OR** phenol, oxyCODONE , polyvinyl alcohol, prochlorperazine, sodium chloride  flush  Vital signs in last 24 hours: Temp:  [97.5 F (36.4 C)-98.6 F (37 C)] 97.5 F (36.4 C) (05/28 1106) Pulse Rate:  [63-82] 77 (05/28 1106) Resp:  [16-17] 16 (05/28 1106) BP: (130-170)/(66-70) 130/66 (05/28 1106) SpO2:  [96 %-98 %] 97 % (05/28 1106) FiO2 (%):  [21 %] 21 % (05/28 1106) Last BM Date : 12/25/23  Intake/Output Summary (Last 24 hours) at 12/26/2023 1253 Last data filed at 12/25/2023 1708 Gross per 24 hour  Intake --  Output 550 ml  Net -550 ml    Intake/Output from previous day: 05/27  0701 - 05/28 0700 In: -  Out: 1100 [Urine:1100] Intake/Output this shift: No intake/output data recorded.   Physical Exam:  General: Alert female in NAD.  In bedside chair. Heart:  Regular rate .  Pulmonary: Normal respiratory effort Abdomen: Soft, nondistended, nontender. Normal bowel sounds Psych: Pleasant. Cooperative     LOS: 5 days   Mai Schwalbe ,NP 12/26/2023, 12:53 PM   I have taken an interval history, thoroughly reviewed the chart and examined the patient. I agree with the Advanced Practitioner's note, impression and recommendations, and have recorded additional findings, impressions and recommendations below. I performed a substantive portion of this encounter (>50% time spent), including a complete performance of the medical decision making.  My  additional thoughts are as follows:  No further bleeding On bowel regimen Resumption of DAPT as indicated above Inpatient GI service signing off-call as needed.    Kerby Pearson III [phone]

## 2023-12-27 ENCOUNTER — Inpatient Hospital Stay (HOSPITAL_COMMUNITY)
Admission: AD | Admit: 2023-12-27 | Discharge: 2024-01-11 | DRG: 057 | Disposition: A | Discharge status: Home Health | Source: Intra-hospital | Attending: Physical Medicine & Rehabilitation | Admitting: Physical Medicine & Rehabilitation

## 2023-12-27 ENCOUNTER — Other Ambulatory Visit: Payer: Self-pay

## 2023-12-27 ENCOUNTER — Encounter (HOSPITAL_COMMUNITY): Payer: Self-pay | Admitting: Physical Medicine & Rehabilitation

## 2023-12-27 DIAGNOSIS — Z96641 Presence of right artificial hip joint: Secondary | ICD-10-CM | POA: Diagnosis present

## 2023-12-27 DIAGNOSIS — F0150 Vascular dementia without behavioral disturbance: Secondary | ICD-10-CM | POA: Diagnosis present

## 2023-12-27 DIAGNOSIS — Z79890 Hormone replacement therapy (postmenopausal): Secondary | ICD-10-CM | POA: Diagnosis not present

## 2023-12-27 DIAGNOSIS — I69398 Other sequelae of cerebral infarction: Secondary | ICD-10-CM | POA: Diagnosis not present

## 2023-12-27 DIAGNOSIS — K5900 Constipation, unspecified: Secondary | ICD-10-CM | POA: Diagnosis present

## 2023-12-27 DIAGNOSIS — Z85819 Personal history of malignant neoplasm of unspecified site of lip, oral cavity, and pharynx: Secondary | ICD-10-CM

## 2023-12-27 DIAGNOSIS — Z7902 Long term (current) use of antithrombotics/antiplatelets: Secondary | ICD-10-CM | POA: Diagnosis not present

## 2023-12-27 DIAGNOSIS — R531 Weakness: Secondary | ICD-10-CM | POA: Diagnosis present

## 2023-12-27 DIAGNOSIS — E871 Hypo-osmolality and hyponatremia: Secondary | ICD-10-CM | POA: Diagnosis not present

## 2023-12-27 DIAGNOSIS — D62 Acute posthemorrhagic anemia: Secondary | ICD-10-CM | POA: Diagnosis present

## 2023-12-27 DIAGNOSIS — I2510 Atherosclerotic heart disease of native coronary artery without angina pectoris: Secondary | ICD-10-CM | POA: Diagnosis present

## 2023-12-27 DIAGNOSIS — R933 Abnormal findings on diagnostic imaging of other parts of digestive tract: Secondary | ICD-10-CM | POA: Diagnosis not present

## 2023-12-27 DIAGNOSIS — H409 Unspecified glaucoma: Secondary | ICD-10-CM | POA: Diagnosis present

## 2023-12-27 DIAGNOSIS — Z7982 Long term (current) use of aspirin: Secondary | ICD-10-CM

## 2023-12-27 DIAGNOSIS — M7989 Other specified soft tissue disorders: Secondary | ICD-10-CM | POA: Diagnosis not present

## 2023-12-27 DIAGNOSIS — I82411 Acute embolism and thrombosis of right femoral vein: Secondary | ICD-10-CM | POA: Diagnosis present

## 2023-12-27 DIAGNOSIS — I10 Essential (primary) hypertension: Secondary | ICD-10-CM | POA: Diagnosis present

## 2023-12-27 DIAGNOSIS — S72001D Fracture of unspecified part of neck of right femur, subsequent encounter for closed fracture with routine healing: Secondary | ICD-10-CM | POA: Diagnosis not present

## 2023-12-27 DIAGNOSIS — Z01810 Encounter for preprocedural cardiovascular examination: Secondary | ICD-10-CM | POA: Diagnosis not present

## 2023-12-27 DIAGNOSIS — W1830XD Fall on same level, unspecified, subsequent encounter: Secondary | ICD-10-CM | POA: Diagnosis not present

## 2023-12-27 DIAGNOSIS — Z952 Presence of prosthetic heart valve: Secondary | ICD-10-CM

## 2023-12-27 DIAGNOSIS — H5461 Unqualified visual loss, right eye, normal vision left eye: Secondary | ICD-10-CM

## 2023-12-27 DIAGNOSIS — E222 Syndrome of inappropriate secretion of antidiuretic hormone: Secondary | ICD-10-CM | POA: Diagnosis present

## 2023-12-27 DIAGNOSIS — F0280 Dementia in other diseases classified elsewhere without behavioral disturbance: Secondary | ICD-10-CM | POA: Diagnosis present

## 2023-12-27 DIAGNOSIS — Z953 Presence of xenogenic heart valve: Secondary | ICD-10-CM | POA: Diagnosis not present

## 2023-12-27 DIAGNOSIS — H1131 Conjunctival hemorrhage, right eye: Secondary | ICD-10-CM | POA: Diagnosis present

## 2023-12-27 DIAGNOSIS — Z8349 Family history of other endocrine, nutritional and metabolic diseases: Secondary | ICD-10-CM | POA: Diagnosis not present

## 2023-12-27 DIAGNOSIS — S72011A Unspecified intracapsular fracture of right femur, initial encounter for closed fracture: Secondary | ICD-10-CM | POA: Diagnosis not present

## 2023-12-27 DIAGNOSIS — R4189 Other symptoms and signs involving cognitive functions and awareness: Secondary | ICD-10-CM

## 2023-12-27 DIAGNOSIS — J449 Chronic obstructive pulmonary disease, unspecified: Secondary | ICD-10-CM | POA: Diagnosis present

## 2023-12-27 DIAGNOSIS — I639 Cerebral infarction, unspecified: Secondary | ICD-10-CM | POA: Diagnosis not present

## 2023-12-27 DIAGNOSIS — K921 Melena: Secondary | ICD-10-CM

## 2023-12-27 DIAGNOSIS — Z79899 Other long term (current) drug therapy: Secondary | ICD-10-CM

## 2023-12-27 DIAGNOSIS — E785 Hyperlipidemia, unspecified: Secondary | ICD-10-CM | POA: Diagnosis present

## 2023-12-27 DIAGNOSIS — Z87891 Personal history of nicotine dependence: Secondary | ICD-10-CM | POA: Diagnosis not present

## 2023-12-27 DIAGNOSIS — S72001G Fracture of unspecified part of neck of right femur, subsequent encounter for closed fracture with delayed healing: Secondary | ICD-10-CM | POA: Diagnosis not present

## 2023-12-27 DIAGNOSIS — Z604 Social exclusion and rejection: Secondary | ICD-10-CM | POA: Diagnosis present

## 2023-12-27 DIAGNOSIS — E039 Hypothyroidism, unspecified: Secondary | ICD-10-CM | POA: Diagnosis present

## 2023-12-27 DIAGNOSIS — K5901 Slow transit constipation: Secondary | ICD-10-CM

## 2023-12-27 DIAGNOSIS — G309 Alzheimer's disease, unspecified: Secondary | ICD-10-CM | POA: Diagnosis present

## 2023-12-27 LAB — RENAL FUNCTION PANEL
Albumin: 2.7 g/dL — ABNORMAL LOW (ref 3.5–5.0)
Anion gap: 6 (ref 5–15)
BUN: 15 mg/dL (ref 8–23)
CO2: 26 mmol/L (ref 22–32)
Calcium: 9.3 mg/dL (ref 8.9–10.3)
Chloride: 102 mmol/L (ref 98–111)
Creatinine, Ser: 0.85 mg/dL (ref 0.44–1.00)
GFR, Estimated: >60 mL/min (ref >60)
Glucose, Bld: 101 mg/dL — ABNORMAL HIGH (ref 70–99)
Phosphorus: 3.3 mg/dL (ref 2.5–4.6)
Potassium: 4.7 mmol/L (ref 3.5–5.1)
Sodium: 134 mmol/L — ABNORMAL LOW (ref 135–145)

## 2023-12-27 LAB — CBC
HCT: 33.3 % — ABNORMAL LOW (ref 36.0–46.0)
Hemoglobin: 11.2 g/dL — ABNORMAL LOW (ref 12.0–15.0)
MCH: 29.8 pg (ref 26.0–34.0)
MCHC: 33.6 g/dL (ref 30.0–36.0)
MCV: 88.6 fL (ref 80.0–100.0)
Platelets: 390 10*3/uL (ref 150–400)
RBC: 3.76 MIL/uL — ABNORMAL LOW (ref 3.87–5.11)
RDW: 13.4 % (ref 11.5–15.5)
WBC: 8.4 10*3/uL (ref 4.0–10.5)
nRBC: 0 % (ref 0.0–0.2)

## 2023-12-27 LAB — GLUCOSE, CAPILLARY: Glucose-Capillary: 103 mg/dL — ABNORMAL HIGH (ref 70–99)

## 2023-12-27 LAB — MAGNESIUM: Magnesium: 2.1 mg/dL (ref 1.7–2.4)

## 2023-12-27 MED ORDER — ASPIRIN 81 MG PO CHEW: 81.0000 mg | CHEWABLE_TABLET | Freq: Every day | ORAL | Status: DC

## 2023-12-27 MED ORDER — AMLODIPINE BESYLATE 5 MG PO TABS
5.0000 mg | ORAL_TABLET | Freq: Every day | ORAL | Status: DC
  Administered 2023-12-28 – 2024-01-11 (×15): 5 mg via ORAL
  Filled 2023-12-27 (×15): qty 1

## 2023-12-27 MED ORDER — ADULT MULTIVITAMIN W/MINERALS CH
1.0000 | ORAL_TABLET | Freq: Every day | ORAL | Status: DC
  Administered 2023-12-28 – 2024-01-10 (×14): 1 via ORAL
  Filled 2023-12-27 (×13): qty 1

## 2023-12-27 MED ORDER — SENNOSIDES-DOCUSATE SODIUM 8.6-50 MG PO TABS
1.0000 | ORAL_TABLET | Freq: Two times a day (BID) | ORAL | Status: DC
  Administered 2023-12-27 – 2024-01-11 (×30): 1 via ORAL
  Filled 2023-12-27 (×30): qty 1

## 2023-12-27 MED ORDER — CLOPIDOGREL BISULFATE 75 MG PO TABS
75.0000 mg | ORAL_TABLET | Freq: Every day | ORAL | Status: DC
  Administered 2023-12-29 – 2024-01-11 (×14): 75 mg via ORAL
  Filled 2023-12-27 (×14): qty 1

## 2023-12-27 MED ORDER — ACETAMINOPHEN 325 MG PO TABS: 650.0000 mg | ORAL_TABLET | Freq: Four times a day (QID) | ORAL | Status: DC

## 2023-12-27 MED ORDER — ASPIRIN 81 MG PO TBEC
81.0000 mg | DELAYED_RELEASE_TABLET | Freq: Every day | ORAL | Status: DC
  Administered 2023-12-28 – 2024-01-11 (×15): 81 mg via ORAL
  Filled 2023-12-27 (×15): qty 1

## 2023-12-27 MED ORDER — LEVOTHYROXINE SODIUM 75 MCG PO TABS
75.0000 ug | ORAL_TABLET | Freq: Every day | ORAL | Status: DC
  Administered 2023-12-28 – 2024-01-11 (×14): 75 ug via ORAL
  Filled 2023-12-27 (×13): qty 1

## 2023-12-27 MED ORDER — SODIUM CHLORIDE 1 G PO TABS: 1.0000 g | ORAL_TABLET | Freq: Two times a day (BID) | ORAL | Status: DC

## 2023-12-27 MED ORDER — BISACODYL 10 MG RE SUPP: 10.0000 mg | Freq: Every day | RECTAL | Status: DC | PRN

## 2023-12-27 MED ORDER — ENSURE ENLIVE PO LIQD
237.0000 mL | Freq: Two times a day (BID) | ORAL | Status: DC
  Administered 2023-12-28 – 2024-01-04 (×14): 237 mL via ORAL
  Filled 2023-12-27 (×16): qty 237

## 2023-12-27 MED ORDER — SENNOSIDES-DOCUSATE SODIUM 8.6-50 MG PO TABS: 1.0000 | ORAL_TABLET | Freq: Two times a day (BID) | ORAL | Status: DC | PRN

## 2023-12-27 MED ORDER — DORZOLAMIDE HCL-TIMOLOL MAL 2-0.5 % OP SOLN
1.0000 [drp] | Freq: Two times a day (BID) | OPHTHALMIC | Status: DC
  Administered 2023-12-27 – 2024-01-11 (×30): 1 [drp] via OPHTHALMIC
  Filled 2023-12-27: qty 10

## 2023-12-27 MED ORDER — BISACODYL 10 MG RE SUPP
10.0000 mg | Freq: Every day | RECTAL | Status: DC
  Administered 2023-12-28 – 2024-01-11 (×15): 10 mg via RECTAL
  Filled 2023-12-27 (×16): qty 1

## 2023-12-27 MED ORDER — CLOPIDOGREL BISULFATE 75 MG PO TABS
75.0000 mg | ORAL_TABLET | Freq: Every day | ORAL | Status: AC
Start: 2023-12-29 — End: 2024-12-28

## 2023-12-27 MED ORDER — MELATONIN 5 MG PO TABS
5.0000 mg | ORAL_TABLET | Freq: Every evening | ORAL | Status: DC | PRN
  Administered 2023-12-27 – 2024-01-02 (×4): 5 mg via ORAL
  Filled 2023-12-27 (×4): qty 1

## 2023-12-27 MED ORDER — PANTOPRAZOLE SODIUM 40 MG PO TBEC
40.0000 mg | DELAYED_RELEASE_TABLET | Freq: Two times a day (BID) | ORAL | Status: DC
  Administered 2023-12-27 – 2024-01-11 (×30): 40 mg via ORAL
  Filled 2023-12-27 (×30): qty 1

## 2023-12-27 MED ORDER — AMLODIPINE BESYLATE 5 MG PO TABS
5.0000 mg | ORAL_TABLET | Freq: Every day | ORAL | Status: DC
  Administered 2023-12-27: 5 mg via ORAL
  Filled 2023-12-27: qty 1

## 2023-12-27 MED ORDER — SODIUM CHLORIDE 1 G PO TABS
1.0000 g | ORAL_TABLET | Freq: Two times a day (BID) | ORAL | Status: DC
  Administered 2023-12-27 – 2024-01-11 (×30): 1 g via ORAL
  Filled 2023-12-27 (×30): qty 1

## 2023-12-27 MED ORDER — AMLODIPINE BESYLATE 5 MG PO TABS: 5.0000 mg | ORAL_TABLET | Freq: Every day | ORAL | Status: DC

## 2023-12-27 MED ORDER — PREDNISOLONE ACETATE 1 % OP SUSP
1.0000 [drp] | Freq: Every morning | OPHTHALMIC | Status: DC
  Administered 2023-12-28 – 2024-01-11 (×15): 1 [drp] via OPHTHALMIC
  Filled 2023-12-27: qty 5

## 2023-12-27 MED ORDER — ENSURE ENLIVE PO LIQD: 237.0000 mL | Freq: Two times a day (BID) | ORAL | Status: DC

## 2023-12-27 MED ORDER — METOPROLOL SUCCINATE ER 50 MG PO TB24
100.0000 mg | ORAL_TABLET | Freq: Every day | ORAL | Status: DC
  Administered 2023-12-28 – 2024-01-10 (×12): 100 mg via ORAL
  Filled 2023-12-27 (×14): qty 2

## 2023-12-27 MED ORDER — OXYCODONE HCL 5 MG PO TABS
2.5000 mg | ORAL_TABLET | Freq: Three times a day (TID) | ORAL | Status: DC | PRN
  Administered 2023-12-27: 2.5 mg via ORAL
  Administered 2023-12-29: 5 mg via ORAL
  Administered 2023-12-31: 2.5 mg via ORAL
  Filled 2023-12-27 (×3): qty 1

## 2023-12-27 MED ORDER — PANTOPRAZOLE SODIUM 40 MG PO TBEC: 40.0000 mg | DELAYED_RELEASE_TABLET | Freq: Every day | ORAL | Status: DC

## 2023-12-27 MED ORDER — MEMANTINE HCL 10 MG PO TABS
5.0000 mg | ORAL_TABLET | Freq: Every day | ORAL | Status: DC
  Administered 2023-12-27 – 2024-01-10 (×15): 5 mg via ORAL
  Filled 2023-12-27 (×15): qty 1

## 2023-12-27 MED ORDER — ROSUVASTATIN CALCIUM 5 MG PO TABS
5.0000 mg | ORAL_TABLET | Freq: Every day | ORAL | Status: DC
  Administered 2023-12-28 – 2024-01-10 (×14): 5 mg via ORAL
  Filled 2023-12-27 (×13): qty 1

## 2023-12-27 MED ORDER — CARMEX CLASSIC LIP BALM EX OINT: TOPICAL_OINTMENT | CUTANEOUS | Status: DC | PRN

## 2023-12-27 MED ORDER — POLYVINYL ALCOHOL 1.4 % OP SOLN: 1.0000 [drp] | OPHTHALMIC | Status: DC | PRN

## 2023-12-27 MED ORDER — LATANOPROST 0.005 % OP SOLN
1.0000 [drp] | Freq: Every day | OPHTHALMIC | Status: DC
  Administered 2023-12-27 – 2024-01-10 (×15): 1 [drp] via OPHTHALMIC
  Filled 2023-12-27 (×2): qty 2.5

## 2023-12-27 NOTE — H&P (Incomplete)
 Physical Medicine and Rehabilitation Admission H&P    Chief Complaint  Patient presents with   Altered Mental Status  : HPI: [name] is an 83 year old right-handed female with history of COPD with pulmonary nodule and quit smoking 29 years ago, osteoporosis, severe aortic stenosis status post TAVR 4/25, mild nonobstructive CAD followed by Dr. Antoinette Batman, oral cancer with tongue resection followed at [facility], diagnosed mild Alzheimer's and vascular dementia followed at [facility] neurology, hypertension, hyperlipidemia, hypothyroidism.  Per chart review patient lives with daughter.  1 level home 3 steps to entry.  Daughter works during the day.  Independent mobility prior to admission with no assistive device, will furniture surf and likes to walk to the mailbox to get the mail.  There has been reports of recent falls.  Typically she was able to do some small cooking and cleaning.  Daughter manages medicines and finances.  Presented 12/18/2023 due to increasing confusion over the past 5 days with associated unsteady gait.  Cranial CT scan negative for acute changes.  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  Underlying age-related cerebral atrophy with moderately advanced chronic microvascular ischemic disease.  CTA with no emergent findings.  Admission chemistries unremarkable except CO2 21, creatinine 1.05, GFR 53, ammonia levels unremarkable, urinalysis negative nitrite.  Neurology follow-up.  Patient did not receive TNK.  Echocardiogram with ejection fraction of 65 to 70% grade 1 diastolic dysfunction no regional wall motion abnormalities.  Neurology follow-up maintained on aspirin  81 mg daily with the addition of Plavix  75 mg daily x 3 weeks then Plavix  alone.  Recommendations of 30-day heart monitor after discharge an order has been placed.  Hospital course complicated by unwitnessed fall  while  in the hospital 12/19/2023 with no loss of consciousness sustaining a right displaced femoral neck fracture.  Patient was cleared for surgery by cardiology.  Underwent right total hip arthroplasty 12/21/2023 per Dr. Curtiss Dowdy and advised weightbearing as tolerated right lower extremity..  She did have postoperative anemia 9.4 and transfused 1 unit 5/26.  Gastroenterology services consulted 12/25/2023 Dr. Dominic Friendly for reported rectal bleeding.  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  Her Plavix  and aspirin  were initially held with hemoglobin stable at 11.  Her aspirin  has been resumed 12/26/2023 and Plavix  in 3 days beginning  12/29/2023.  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  Urine sodium 97 fluid restriction 1500 cc and maintained on sodium chloride  tablet.  Therapy evaluations completed due to patient's decreased functional mobility was admitted for a comprehensive rehab program.  Review of Systems  Constitutional:  Negative for fever.  Eyes:  Negative for blurred vision and double vision.  Respiratory:  Negative for cough.        Shortness of breath with heavy exertion  Cardiovascular:  Negative for chest pain, palpitations and leg swelling.  Gastrointestinal:  Positive for constipation. Negative for heartburn, nausea and vomiting.  Genitourinary:  Negative for dysuria, flank pain and hematuria.  Musculoskeletal:  Positive for falls, joint pain and myalgias.  Neurological:  Positive for weakness.  Psychiatric/Behavioral:  Positive for memory loss.   All other systems reviewed and are negative.  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 Abel Hoe & Dr. Honey Lusty   Scoliosis    Severe aortic stenosis    Wears glasses    Past Surgical History:  Procedure Laterality Date   CESAREAN SECTION  07/31/1976   TWINS, ONE WAS STILLBORN   INTRAOPERATIVE TRANSTHORACIC ECHOCARDIOGRAM N/A 11/20/2023   Procedure: ECHOCARDIOGRAM, TRANSTHORACIC;  Surgeon: Odie Benne, MD;  Location: [facility] INVASIVE [facility];  Service: Cardiovascular;  Laterality: N/A;   MOUTH SURGERY  07/31/1994   tongue resection   NODULE REMOVED     BENIGN, LOWER NECK   RIGHT HEART CATH AND CORONARY ANGIOGRAPHY N/A 09/21/2023   Procedure: RIGHT HEART CATH AND CORONARY ANGIOGRAPHY;  Surgeon: Odie Benne, MD;  Location: [facility] INVASIVE CV [facility];  Service: Cardiovascular;  Laterality: N/A;   TOTAL HIP ARTHROPLASTY Right 12/21/2023   Procedure: ARTHROPLASTY, HIP, TOTAL, ANTERIOR APPROACH;  Surgeon: Laneta Pintos, MD;  Location: [facility] OR;  Service: Orthopedics;  Laterality: Right;  HEMI HIP   Family History  Problem Relation Age of Onset   Thyroid  disease Mother    Dementia Mother    Thyroid  disease Sister    Social History:  reports that she quit smoking about 29 years ago. Her smoking use included cigarettes. She started smoking about 31 years ago. She has a 0.4 pack-year smoking history. She has never used smokeless tobacco. She reports that she does not drink alcohol  and does not use drugs. Allergies:  Allergies  Allergen Reactions   Brimonidine Itching    Red eyes   Fluocinolone Acetonide Other (See Comments)    Unable to recall   Naphazoline-Polyethyl Glycol     THAT YOU USE TO DILATE YOUR EYES.Aaron AasMAKES BP GO UP, CAN ONLY USE HALF STRENGTH   Triamcinolone Acetonide Other (See Comments)    Unable to recall   Tropicamide Other (See Comments)    Elevates IOP OK to use 0.5%   Medications Prior to Admission  Medication Sig Dispense Refill    aspirin  81 MG chewable tablet Chew 1 tablet (81 mg total) by mouth daily.     Calcium  Carbonate (CALTRATE 600 PO) Take 1 tablet by mouth daily after lunch.     Cholecalciferol (VITAMIN D3) 10 MCG (400 UNIT) tablet Take 400 Units by mouth daily after lunch.     dorzolamide -timolol  (COSOPT ) 22.3-6.8 MG/ML ophthalmic solution Place 1 drop into the left eye every 12 (twelve) hours.     levothyroxine  (SYNTHROID ) 75 MCG tablet Take 75 mcg by mouth daily after lunch.     LUMIGAN 0.01 % SOLN Place 1 drop into the left eye at bedtime.     memantine  (NAMENDA ) 5 MG tablet TAKE 1 TABLET BY MOUTH EVERYDAY AT BEDTIME 90 tablet 4   metoprolol  succinate (TOPROL -XL) 100 MG 24 hr tablet Take 100 mg by mouth daily after lunch.     Multiple Vitamin (MULTIVITAMIN WITH MINERALS) TABS tablet Take 1 tablet by mouth daily after lunch. Centrum Silver     polyvinyl alcohol  (ARTIFICIAL TEARS) 1.4 % ophthalmic solution Place 1 drop into both eyes as needed for dry eyes.     prednisoLONE  acetate (PREDNISOLONE  ACETATE P-F) 1 % ophthalmic suspension Place 1 drop into the right eye in the morning.  rosuvastatin  (CRESTOR ) 5 MG tablet Take 5 mg by mouth daily after lunch.        Home: Home Living Family/patient expects to be discharged to:: Private residence Living Arrangements: Children (daughter) Available Help at Discharge: Family, Available PRN/intermittently Type of Home: House Home Access: Stairs to enter Secretary/administrator of Steps: 3 Entrance Stairs-Rails: None Home Layout: One level Bathroom Shower/Tub: Engineer, manufacturing systems: Standard Home Equipment: None, Other (comment) (cameras inside the house for daughter to make sure mom is ok while shes at work) Additional Comments: daughter works during the day,  Lives With: Daughter   Functional History: Prior Function Prior Level of Function : Independent/Modified Independent, History of Falls (last six months) Mobility Comments: Ind with no AD,  will furniture surf, likes to walk to [employer] and get mail. and likes to go on walks around the neighborhood with daughter. Multiple recent falls ADLs Comments: does not drive, typically does small cooking and cleaning and showers standing up, daughter manages medicines and finanaces  Functional Status:  Mobility: Bed Mobility Overal bed mobility: Needs Assistance Bed Mobility: Supine to Sit Rolling: Max assist Sidelying to sit: Max assist, Used rails, HOB elevated Supine to sit: Mod assist Sit to supine: +2 for physical assistance, Max assist General bed mobility comments: Mod A to scoot hips forward and for trunk elevation. Therapist assisting with RLE management and cues for pt to shift LLE during bed mobility Transfers Overall transfer level: Needs assistance Equipment used: 2 person hand held assist, Rolling walker (2 wheels) Transfers: Sit to/from Stand, Bed to chair/wheelchair/BSC Sit to Stand: Mod assist, +2 physical assistance, +2 safety/equipment Bed to/from chair/wheelchair/BSC transfer type:: Step pivot Step pivot transfers: Mod assist, +2 physical assistance, +2 safety/equipment General transfer comment: Cues for hand placement and upright standing posture. +2 Mod A to power up from bed and BSC. RLE uncoordinated with gait. Ambulation/Gait Ambulation/Gait assistance: +2 physical assistance, +2 safety/equipment, Mod assist Gait Distance (Feet): 35 Feet (10+25) Assistive device: 2 person hand held assist, Rolling walker (2 wheels) Gait Pattern/deviations: Step-through pattern, Drifts right/left, Trunk flexed, Narrow base of support, Decreased stride length General Gait Details: 2 bouts of ambulation. First bout with 2 person HHA to sink. 2nd bout with RW in hallway. Daughter used as visual cue for ambulation distance. Constant cues for sequencing and increasing step length. Gait velocity: decr    ADL: ADL Overall ADL's : Needs assistance/impaired Eating/Feeding: Set up,  Bed level Eating/Feeding Details (indicate cue type and reason): meal tray in room, Pt reports self-feeding- not witnessed Grooming: Sitting, Contact guard assist Grooming Details (indicate cue type and reason): sink level Upper Body Bathing: Minimal assistance, Sitting Lower Body Bathing: Moderate assistance, Sitting/lateral leans Lower Body Bathing Details (indicate cue type and reason): knees down Upper Body Dressing : Moderate assistance, Sitting, Cueing for sequencing Upper Body Dressing Details (indicate cue type and reason): doff/don gown; Pt needing cues for her to initiate task without OT assist and to sequence doffing shirt. Lower Body Dressing: Total assistance, Sitting/lateral leans, Sit to/from stand Lower Body Dressing Details (indicate cue type and reason): remains total A for socks Toilet Transfer: Moderate assistance, +2 for physical assistance, +2 for safety/equipment, BSC/3in1 Toilet Transfer Details (indicate cue type and reason): step pivot to [facility], cues for hand placement safety-reaching back to sit. Toileting- Clothing Manipulation and Hygiene: Maximal assistance, Sit to/from stand Toileting - Clothing Manipulation Details (indicate cue type and reason): rear pericare assist needed from therapist. Functional mobility during ADLs: Moderate assistance, +2 for  safety/equipment, Rolling walker (2 wheels) General ADL Comments: needs a lot of reassurance  Cognition: Cognition Overall Cognitive Status: Difficult to assess Arousal/Alertness: Awake/alert Orientation Level: Oriented to person, Disoriented to time, Disoriented to place, Disoriented to situation Memory: Impaired Memory Impairment: Storage deficit, Decreased recall of new information Awareness: Impaired Awareness Impairment: Intellectual impairment Cognition Arousal: Alert Behavior During Therapy: Flat affect, Anxious Overall Cognitive Status: Difficult to assess  Physical Exam: Blood pressure (!) 150/74,  pulse 84, temperature 97.7 F (36.5 C), temperature source Oral, resp. rate 17, height 5\' 6"  (1.676 m), weight 60.1 kg, SpO2 98%. Physical Exam Skin:    Comments: Hip incision is dressed.  Appropriately tender  Neurological:     Comments: Patient is alert and a bit impulsive.  Makes eye contact with examiner.  Follows simple verbal commands.  She is oriented to self but not month and year.     Results for orders placed or performed during the hospital encounter of 12/18/23 (from the past 48 hours)  CBC     Status: Abnormal   Collection Time: 12/25/23  8:39 AM  Result Value Ref Range   WBC 8.1 4.0 - 10.5 K/uL   RBC 3.66 (L) 3.87 - 5.11 MIL/uL   Hemoglobin 11.1 (L) 12.0 - 15.0 g/dL   HCT 16.1 (L) 09.6 - 04.5 %   MCV 88.3 80.0 - 100.0 fL   MCH 30.3 26.0 - 34.0 pg   MCHC 34.4 30.0 - 36.0 g/dL   RDW 40.9 81.1 - 91.4 %   Platelets 276 150 - 400 K/uL   nRBC 0.0 0.0 - 0.2 %    Comment: Performed at [facility], [address], [place], [place] [address]  Osmolality, urine     Status: None   Collection Time: 12/25/23  1:40 PM  Result Value Ref Range   Osmolality, Ur 388 300 - 900 mOsm/kg    Comment: REPEATED TO VERIFY Performed at [facility], [address], [place], [place] [address]   Sodium, urine, random     Status: None   Collection Time: 12/25/23  1:40 PM  Result Value Ref Range   Sodium, Ur 97 mmol/L    Comment: Performed at [facility], [address], [place], [place] [address]  Magnesium      Status: None   Collection Time: 12/26/23  5:52 AM  Result Value Ref Range   Magnesium  2.1 1.7 - 2.4 mg/dL    Comment: Performed at [facility], [address] [place], [place] [address]  CBC     Status: Abnormal   Collection Time: 12/26/23  5:52 AM  Result Value Ref Range   WBC 9.0 4.0 - 10.5 K/uL   RBC 3.68 (L) 3.87 - 5.11 MIL/uL   Hemoglobin 11.0 (L) 12.0 - 15.0 g/dL   HCT 95.2 (L) 84.1 - 32.4 %   MCV 87.2 80.0 - 100.0 fL   MCH 29.9 26.0 - 34.0 pg    MCHC 34.3 30.0 - 36.0 g/dL   RDW 40.1 02.7 - 25.3 %   Platelets 310 150 - 400 K/uL   nRBC 0.0 0.0 - 0.2 %    Comment: Performed at [facility], [address] [place], [place] [address]  Renal function panel     Status: Abnormal   Collection Time: 12/26/23  5:52 AM  Result Value Ref Range   Sodium 129 (L) 135 - 145 mmol/L   Potassium 4.2 3.5 - 5.1 mmol/L  Chloride 100 98 - 111 mmol/L   CO2 25 22 - 32 mmol/L   Glucose, Bld 109 (H) 70 - 99 mg/dL    Comment: Glucose reference range applies only to samples taken after fasting for at least 8 hours.   BUN 13 8 - 23 mg/dL   Creatinine, Ser 1.61 0.44 - 1.00 mg/dL   Calcium  8.5 (L) 8.9 - 10.3 mg/dL   Phosphorus 3.0 2.5 - 4.6 mg/dL   Albumin  2.5 (L) 3.5 - 5.0 g/dL   GFR, Estimated >09 >60 mL/min    Comment: (NOTE) Calculated using the CKD-EPI Creatinine Equation (2021)    Anion gap 4 (L) 5 - 15    Comment: Performed at [facility] Lab, [address], [place], [place] [address]   No results found.    Blood pressure (!) 150/74, pulse 84, temperature 97.7 F (36.5 C), temperature source Oral, resp. rate 17, height 5\' 6"  (1.676 m), weight 60.1 kg, SpO2 98%.  Medical Problem List and Plan: 1. 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  Antithrombotics: -DVT/anticoagulation:  Mechanical: Antiembolism stockings, thigh (TED hose) Bilateral lower extremities.  Check vascular study  -antiplatelet therapy: Aspirin  81 mg daily and Plavix 75 mg daily x 3 weeks then Plavix alone.PLAVIX HELD UNTIL 5/31 DUE TO RECTAL BLEEDING 3. Pain Management: Oxycodone  as needed 4. Mood/Behavior/Sleep/dementia: Namenda  5 mg nightly, melatonin 5 mg nightly as needed  -antipsychotic agents: N/A 5. Neuropsych/cognition: This patient is not capable of making decisions  on her own behalf. 6. Skin/Wound Care: Routine skin checks 7. Fluids/Electrolytes/Nutrition: Routine N and outs with follow-up chemistries 8.  Right displaced femoral neck fracture.  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 Curtiss Dowdy.  Weightbearing as tolerated 9.  Acute blood loss anemia.  Transfuse 1 unit packed red blood cells 5/26.  Follow-up CBC 10.  Rectal bleeding.  Follow-up GI services.  CT angio negative for active bleeding felt bleeding likely secondary to stercoral colitis.  Aspirin  resumed 5/28 and plan to RESUME PLAVIX 12/29/2023.  Continue Protonix 11.  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  Urine sodium 97.  Fluid restriction 1500 cc.  P.o. sodium chloride  12.  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  Follow-up cardiology services 13.  History of oral cancer with tongue resection.  Follow-up outpatient [facility] 14.  Glaucoma.  Continue eyedrops as indicated 15.  Hypertension.  Toprol-XL 100 mg daily.  Monitor with increased mobility 16.  Hyperlipidemia.  Crestor  17.  Hypothyroidism.  Synthroid  18.  Constipation.  Senokot S1 tablet twice daily as well as Dulcolax suppository Sterling Eisenmenger, PA-C 12/27/2023

## 2023-12-27 NOTE — Care Management Important Message (Signed)
 Important Message  Patient Details  Name: [name] MRN: [id number] Date of Birth: 1940-12-04   Important Message Given:  Yes - Medicare IM     Wynonia Hedges 12/27/2023, 9:19 AM

## 2023-12-27 NOTE — Discharge Summary (Signed)
 Physician Discharge Summary  [name] ZOX:[id number] DOB: 29-Aug-1940 DOA: 12/18/2023  PCP: Imelda Man, MD  Admit date: 12/18/2023 Discharge date: 12/27/23  Admitted From: Home Disposition: [facility] Recommendations for Outpatient Follow-up:  Outpatient follow-up with orthopedic surgery as below Monitor CBC daily for the next 3 to 4 days. Please follow up on the following pending results: None   Discharge Condition: Stable CODE STATUS: Full code  Follow-up Information     Haddix, Florentina Huntsman, MD. Schedule an appointment as soon as possible for a visit in 2 week(s).   Specialty: Orthopedic Surgery Why: for wound check and repeat x-rays Contact information: 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  Brain MRI revealed a small left high frontal punctate stroke.  CT angio head and neck with no LVO but suboptimal study.  TTE without significant finding.  LDL 45.  A1c 5%.  Neurology recommended DAPT for 3 weeks and then Plavix alone along with 30-day heart monitor after discharge.    Patient had unwitnessed fall out of bed and sustained right femoral neck fracture on 12/19/2023.  She underwent right THA by Dr. Curtiss Dowdy on 12/21/2023.  Postoperatively, hemoglobin dropped and she was transfused 1 unit with appropriate response.   While waiting on placement, patient had rectal bleeding the night of 5/26-27.  Plavix and aspirin  held after discussion with neurology and cardiology.  GI consulted and recommended bowel regimen.   Hemoglobin remained stable.  GI recommended resuming aspirin   on 5/28 and Plavix 3 days later.  Therapy recommended CIR.  See individual problem list below for more.   Problems addressed during this hospitalization Acute ischemic CVA, POA: Brain MRI revealed small left high frontal punctate stroke (recent TAVR procedure).  CT angio head and neck without significant finding but limited study.  TTE without significant finding. LDL 45, A1c 5 -Neurology recs:  -Plavix and aspirin  for 3 weeks followed by Plavix alone -30-day heart monitor-cardiology to arrange.  -Therapy recommended SNF -Per GI, aspirin  resumed on 5/28.  Plavix to be resumed on 5/31 if no further bleed   Unwitnessed fall in the hospital: Unwitnessed fall the night of 5/21.  Acute mildly displaced and angulated fracture of mid right femoral neck -S/p ORIF by Dr. Curtiss Dowdy on 5/23 -Pain control-scheduled Tylenol  with as needed for pain -Aspirin  and Plavix would serve VTE prophylaxis -Therapy recommended CIR -Fall precautions   Postoperative acute blood loss anemia: B/l Hgb 11-12..  Transfused 1 unit on 5/26. Rectal bleed-noted to have rectal bleed the night of 4/26.  H&H stable Recent Labs    12/20/23 0958 12/21/23 0651 12/22/23 0743 12/23/23 0701 12/24/23 0609 12/24/23 1844 12/25/23 0311 12/25/23 0839 12/26/23 0552 12/27/23 0526  HGB 12.4 14.2 9.4* 8.7* 8.3* 10.4* 10.3* 11.1* 11.0* 11.2*  -GI recommended resuming aspirin  on 5/28 and Plavix on 5/31 -Continue bowel regimen -Continue p.o. Protonix 40 mg daily   Vascular dementia: Awake and alert.  Oriented to self, daughter and place.  Follows commands. -Continue home Namenda  -Reorientation and delirium precaution -Minimize avoid sedating medications  Essential hypertension: BP slightly elevated -Continue Toprol-XL -Added low-dose amlodipine - P.o. 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  Urine sodium 97.  Improved after starting p.o. NaCl. - Fluid restriction to 1500 cc - P.o. sodium chloride  - Recheck BMP in 3 to 4 days   Constipation - Bowel regimen   Hyperlipidemia -Continue Crestor            Time spent 35 minutes  Vital signs Vitals:   12/26/23 2147 12/27/23 0006 12/27/23 0314 12/27/23 0808  BP: 135/64 (!) 151/66 (!) 150/74 (!) 166/69  Pulse: 65 66 84 79  Temp: (!) 97.5 F (36.4 C) 98.1 F (36.7 C) 97.7 F (36.5 C) 98 F (36.7 C)  Resp: 18 17 17 20   Height:      Weight:      SpO2: 95% 97% 98% 95%  TempSrc: Oral Oral Oral Oral  BMI (Calculated):         Discharge exam  GENERAL: No apparent distress.  Nontoxic. HEENT: MMM.  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  Subconjunctival hemorrhage medially.  Ecchymosis around right eye.  NECK: Supple.  No apparent JVD.  RESP:  No IWOB.  Fair aeration bilaterally. CVS: RRR.  Normal rate.  Heart sounds normal.  ABD/GI/GU: BS+. Abd soft, NTND.  MSK/EXT:  Moves extremities but some limitation due to pain in right leg. No apparent deformity. No edema.  SKIN: Surgical dressing DCI. NEURO: Awake and alert. Oriented to self, family and place.  Follows once.  No apparent focal neuro deficit. PSYCH: Calm. Normal affect.   Discharge Instructions Discharge Instructions     Diet general   Complete by: As directed    Increase activity slowly   Complete by: As directed       Allergies as of 12/27/2023       Reactions   Brimonidine Itching   Red eyes   Fluocinolone Acetonide Other (See Comments)   Unable to recall   Naphazoline-polyethyl Glycol    THAT YOU USE TO DILATE YOUR EYES.Aaron AasMAKES BP GO UP, CAN ONLY USE HALF STRENGTH   Triamcinolone Acetonide Other (See Comments)   Unable to recall   Tropicamide Other (See Comments)   Elevates IOP OK to use 0.5%        Medication List     TAKE these medications    amLODipine 5 MG tablet Commonly known as: NORVASC Take 1  tablet (5 mg total) by mouth daily.   Artificial Tears 1.4 % ophthalmic solution Generic drug: polyvinyl alcohol Place 1 drop into both eyes as needed for dry eyes.   aspirin  81 MG chewable tablet Chew 1 tablet (81 mg total) by mouth daily for 15 days.   bisacodyl 10 MG suppository Commonly known as: DULCOLAX Place 1 suppository (10 mg total) rectally daily as needed for moderate constipation.   CALTRATE 600 PO Take 1 tablet by mouth daily after lunch.   clopidogrel 75 MG tablet Commonly known as: Plavix Take 1 tablet (75 mg total) by mouth daily. Start taking on: Dec 29, 2023   dorzolamide -timolol  2-0.5 % ophthalmic solution Commonly known as: COSOPT  Place 1 drop into the left eye every 12 (twelve) hours.   feeding supplement Liqd Take 237 mLs by mouth 2 (two) times daily between meals.   HYDROcodone-acetaminophen  5-325 MG tablet Commonly known as: NORCO/VICODIN Take 1 tablet by mouth  every 6 (six) hours as needed for severe pain (pain score 7-10).   levothyroxine  75 MCG tablet Commonly known as: SYNTHROID  Take 75 mcg by mouth daily after lunch.   Lumigan 0.01 % Soln Generic drug: bimatoprost Place 1 drop into the left eye at bedtime.   memantine  5 MG tablet Commonly known as: NAMENDA  TAKE 1 TABLET BY MOUTH EVERYDAY AT BEDTIME   metoprolol  succinate 100 MG 24 hr tablet Commonly known as: TOPROL -XL Take 100 mg by mouth daily after lunch.   multivitamin with minerals Tabs tablet Take 1 tablet by mouth daily after lunch. Centrum Silver   pantoprazole  40 MG tablet Commonly known as: PROTONIX  Take 1 tablet (40 mg total) by mouth daily.   prednisoLONE  Acetate P-F 1 % ophthalmic suspension Generic drug: prednisoLONE  acetate Place 1 drop into the right eye in the morning.   rosuvastatin  5 MG tablet Commonly known as: CRESTOR  Take 5 mg by mouth daily after lunch.   senna-docusate 8.6-50 MG tablet Commonly known as: Senokot-S Take 1 tablet by mouth 2 (two)  times daily between meals as needed for mild constipation.   sodium chloride  1 g tablet Take 1 tablet (1 g total) by mouth 2 (two) times daily with a meal.   Vitamin D3 10 MCG (400 UNIT) tablet Take 400 Units by mouth daily after lunch.        Consultations: Orthopedic surgery Neurology Gastroenterology  Procedures/Studies: Right total hip arthroplasty by Dr. Curtiss Dowdy on 5/23   CT ANGIO GI BLEED Result Date: 12/25/2023 CLINICAL DATA:  Right red blood per rectum EXAM: CTA ABDOMEN AND PELVIS WITHOUT AND WITH CONTRAST TECHNIQUE: Multidetector CT imaging of the abdomen and pelvis was performed using the standard protocol during bolus administration of intravenous contrast. Multiplanar reconstructed images and MIPs were obtained and reviewed to evaluate the vascular anatomy. RADIATION DOSE REDUCTION: This exam was performed according to the departmental dose-optimization program which includes automated exposure control, adjustment of the mA and/or kV according to patient size and/or use of iterative reconstruction technique. CONTRAST:  OMNIPAQUE  IOHEXOL  350 MG/ML SOLN COMPARISON:  None Available. FINDINGS: VASCULAR Aorta: Normal caliber aorta without aneurysm, dissection, vasculitis or significant stenosis. Celiac: Patent without evidence of aneurysm, dissection, vasculitis or significant stenosis. SMA: Patent without evidence of aneurysm, dissection, vasculitis or significant stenosis. Renals: Both renal arteries are patent without evidence of aneurysm, dissection, vasculitis, fibromuscular dysplasia or significant stenosis. IMA: Patent without evidence of aneurysm, dissection, vasculitis or significant stenosis. Inflow: Patent without evidence of aneurysm, dissection, vasculitis or significant stenosis. Proximal Outflow: Bilateral common femoral and visualized portions of the superficial and profunda femoral arteries are patent without evidence of aneurysm, dissection, vasculitis or  significant stenosis. Veins: Negative Review of the MIP images confirms the above findings. NON-VASCULAR Lower chest: Small amount of right pleural fluid Hepatobiliary: No focal liver abnormality is seen. No gallstones, gallbladder wall thickening, or biliary dilatation. Pancreas: Unremarkable. No pancreatic ductal dilatation or surrounding inflammatory changes. Spleen: Normal in size without focal abnormality. Adrenals/Urinary Tract: Adrenal glands are unremarkable. Kidneys are normal, without renal calculi, focal lesion, or hydronephrosis. Bladder is unremarkable. Stomach/Bowel: Large stool ball in the rectum. Mild presacral and perirectal stranding. Rectosigmoid diverticulosis. No visible hemorrhage into the colon. No dilated small bowel. Lymphatic: No lymphadenopathy Reproductive: No adnexal mass or free fluid in the pelvis. Other: Gas and edema within the soft tissues anterior to the right hip, likely postsurgical. Musculoskeletal: Recent right hip arthroplasty. No acute osseous findings. Bilateral L5 pars interarticularis defects IMPRESSION: 1.  No visible hemorrhage into the colon. 2. Large stool ball in the rectum with mild presacral and perirectal stranding, which may indicate stercoral colitis. 3. Recent right hip arthroplasty with gas and edema within the soft tissues anterior to the right hip, likely postsurgical. 4. Small amount of right pleural fluid. Electronically Signed   By: Juanetta Nordmann M.D.   On: 12/25/2023 02:48   DG HIP UNILAT W OR W/O PELVIS 2-3 VIEWS RIGHT Result Date: 12/21/2023 CLINICAL DATA:  Postop EXAM: DG HIP (WITH OR WITHOUT PELVIS) 2-3V RIGHT COMPARISON:  12/20/2023 FINDINGS: Interval right hip replacement with normal alignment. Gas in the soft tissues consistent with recent surgery. IMPRESSION: Status post right hip replacement with expected postsurgical change. Electronically Signed   By: Esmeralda Hedge M.D.   On: 12/21/2023 18:09   DG HIP UNILAT WITH PELVIS 1V RIGHT Result  Date: 12/21/2023 CLINICAL DATA:  Elective surgery EXAM: DG HIP (WITH OR WITHOUT PELVIS) 1V RIGHT COMPARISON:  12/20/2023 FINDINGS: Multiple low resolution intraoperative spot views of the right hip. Total fluoroscopy time was 43.3 seconds, fluoroscopic dose of 4.6 mGy. Images were obtained during a right hip replacement. IMPRESSION: Intraoperative fluoroscopic assistance provided during right hip replacement. Electronically Signed   By: Esmeralda Hedge M.D.   On: 12/21/2023 18:09   DG C-Arm 1-60 Min-No Report Result Date: 12/21/2023 Fluoroscopy was utilized by the requesting physician.  No radiographic interpretation.   DG C-Arm 1-60 Min-No Report Result Date: 12/21/2023 Fluoroscopy was utilized by the requesting physician.  No radiographic interpretation.   CT HEAD WO CONTRAST ( ) Result Date: 12/20/2023 EXAM: CT HEAD WITHOUT 12/20/2023 11:59:13 AM TECHNIQUE: CT of the head was performed without the administration of intravenous contrast. Automated exposure control, iterative reconstruction, and/or weight based adjustment of the mA/kV was utilized to reduce the radiation dose to as low as reasonably achievable. COMPARISON: CT angiography of the head and neck and noncontrast head CT on 12/19/2023, and MR head without contrast on 12/18/2023. CLINICAL HISTORY: Head trauma, minor (Age >= 65y). Chief complaints; Altered Mental Status. FINDINGS: BRAIN AND VENTRICLES: There is no acute intracranial hemorrhage, mass effect or midline shift. No abnormal extra-axial fluid collection. The gray-white differentiation is maintained without evidence of an acute infarct. There is no evidence of hydrocephalus. Confluent periventricular and subcortical white matter disease is stable . ORBITS: The visualized portion of the orbits demonstrate no acute abnormality. SINUSES: The visualized paranasal sinuses and mastoid air cells demonstrate no acute abnormality. SOFT TISSUES AND SKULL: No acute abnormality of the visualized  skull or soft tissues. VASCULATURE: Atherosclerotic calcifications are present in the cavernous carotid arteries bilaterally. No hyperdense vessel is present. IMPRESSION: 1. No acute intracranial abnormality related to the minor head trauma and altered mental status. 2. Stable confluent periventricular and subcortical white matter disease. Electronically signed by: Audree Leas MD 12/20/2023 04:35 PM EDT RP Workstation: [id number]   DG FEMUR PORT, MIN 2 VIEWS RIGHT Result Date: 12/20/2023 CLINICAL DATA:  Fall.  Right hip pain. EXAM: RIGHT FEMUR PORTABLE 2 VIEW; DG HIP (WITH OR WITHOUT PELVIS) 2-3V RIGHT COMPARISON:  Pelvis and right hip radiographs 12/19/2023 FINDINGS: There is diffuse decreased bone mineralization. There is a new acute fracture of the mid right femoral neck with mild superior displacement of the distal fracture component and mild varus angulation. The bilateral femoral heads remain normally located. Mild bilateral superolateral acetabular degenerative osteophytosis. Mild bilateral sacroiliac subchondral sclerosis. Normal alignment at the knee. Mild medial compartment of the knee joint space narrowing. IMPRESSION: Acute mildly  displaced and angulated fracture of the mid right femoral neck. This was not present on yesterday's radiographs. Electronically Signed   By: Bertina Broccoli M.D.   On: 12/20/2023 12:12   DG HIP UNILAT WITH PELVIS 2-3 VIEWS RIGHT Result Date: 12/20/2023 CLINICAL DATA:  Fall.  Right hip pain. EXAM: RIGHT FEMUR PORTABLE 2 VIEW; DG HIP (WITH OR WITHOUT PELVIS) 2-3V RIGHT COMPARISON:  Pelvis and right hip radiographs 12/19/2023 FINDINGS: There is diffuse decreased bone mineralization. There is a new acute fracture of the mid right femoral neck with mild superior displacement of the distal fracture component and mild varus angulation. The bilateral femoral heads remain normally located. Mild bilateral superolateral acetabular degenerative osteophytosis. Mild bilateral  sacroiliac subchondral sclerosis. Normal alignment at the knee. Mild medial compartment of the knee joint space narrowing. IMPRESSION: Acute mildly displaced and angulated fracture of the mid right femoral neck. This was not present on yesterday's radiographs. Electronically Signed   By: Bertina Broccoli M.D.   On: 12/20/2023 12:12   DG HIP PORT UNILAT WITH PELVIS 1V RIGHT Result Date: 12/19/2023 CLINICAL DATA:  Right hip pain. EXAM: DG HIP (WITH OR WITHOUT PELVIS) 1V PORT RIGHT COMPARISON:  None Available. FINDINGS: Mildly decreased bone mineralization. Mild bilateral sacroiliac subchondral sclerosis. The bilateral femoroacetabular joint spaces are maintained. Mild superior pubic symphysis degenerative spurring. Moderate severe left L4-5 and moderate left L3-4 disc space narrowing. No acute fracture or dislocation. A vascular phlebolith overlies the left hemipelvis. IMPRESSION: 1. Mild bilateral sacroiliac osteoarthritis. 2. Moderate to severe left L4-5 and moderate left L3-4 degenerative disc and endplate changes. Electronically Signed   By: Bertina Broccoli M.D.   On: 12/19/2023 21:02   ECHOCARDIOGRAM COMPLETE Result Date: 12/19/2023    ECHOCARDIOGRAM REPORT   Patient Name:   GIZZELLE LACOMB Date of Exam: 12/19/2023 Medical Rec #:  [id number]     Height:       66.0 in Accession #:    [id number]    Weight:       132.5 lb Date of Birth:  [birth year]     BSA:          1.679 m Patient Age:    82 years      BP:           171/67 mmHg Patient Gender: F             HR:           68 bpm. Exam Location:  Inpatient Procedure: 2D Echo, Cardiac Doppler and Color Doppler (Both Spectral and Color            Flow Doppler were utilized during procedure). Indications:    Stroke  History:        Patient has prior history of Echocardiogram examinations, most                 recent 11/21/2023.  Sonographer:    Janette Medley Referring Phys: CAROLE N HALL IMPRESSIONS  1. Left ventricular ejection fraction, by estimation, is 65 to 70%. The  left ventricle has normal function. The left ventricle has no regional wall motion abnormalities. Left ventricular diastolic parameters are consistent with Grade I diastolic dysfunction (impaired relaxation). Elevated left ventricular end-diastolic pressure.  2. Right ventricular systolic function is normal. The right ventricular size is normal.  3. The mitral valve is normal in structure. No evidence of mitral valve regurgitation. No evidence of mitral stenosis.  4. The aortic valve is normal in structure. Aortic valve regurgitation  is mild. No aortic stenosis is present. Echo findings are consistent with normal structure and function of the aortic valve prosthesis. Aortic valve area, by VTI measures 2.29 cm. Aortic valve mean gradient measures 5.0 mmHg. Aortic valve Vmax measures 1.48 m/s.  5. The inferior vena cava is normal in size with greater than 50% respiratory variability, suggesting right atrial pressure of 3 mmHg. FINDINGS  Left Ventricle: Left ventricular ejection fraction, by estimation, is 65 to 70%. The left ventricle has normal function. The left ventricle has no regional wall motion abnormalities. The left ventricular internal cavity size was normal in size. There is  no left ventricular hypertrophy. Left ventricular diastolic parameters are consistent with Grade I diastolic dysfunction (impaired relaxation). Elevated left ventricular end-diastolic pressure. Right Ventricle: The right ventricular size is normal. No increase in right ventricular wall thickness. Right ventricular systolic function is normal. Left Atrium: Left atrial size was normal in size. Right Atrium: Right atrial size was normal in size. Pericardium: There is no evidence of pericardial effusion. Mitral Valve: The mitral valve is normal in structure. No evidence of mitral valve regurgitation. No evidence of mitral valve stenosis. MV peak gradient, 7.5 mmHg. The mean mitral valve gradient is 5.0 mmHg. Tricuspid Valve: The tricuspid  valve is normal in structure. Tricuspid valve regurgitation is not demonstrated. No evidence of tricuspid stenosis. Aortic Valve: The aortic valve is normal in structure. Aortic valve regurgitation is mild. No aortic stenosis is present. Aortic valve mean gradient measures 5.0 mmHg. Aortic valve peak gradient measures 8.8 mmHg. Aortic valve area, by VTI measures 2.29 cm. 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 Echo findings are consistent with normal structure and function of the aortic valve prosthesis. Pulmonic Valve: The pulmonic valve was normal in structure. Pulmonic valve regurgitation is mild. No evidence of pulmonic stenosis. Aorta: The aortic root is normal in size and structure. Venous: The inferior vena cava is normal in size with greater than 50% respiratory variability, suggesting right atrial pressure of 3 mmHg. IAS/Shunts: No atrial level shunt detected by color flow Doppler.  LEFT VENTRICLE PLAX 2D LVIDd:         3.40 cm   Diastology LVIDs:         2.10 cm   LV e' medial:    7.83 cm/s LV PW:         1.00 cm   LV E/e' medial:  14.3 LV IVS:        1.10 cm   LV e' lateral:   8.16 cm/s LVOT diam:     1.70 cm   LV E/e' lateral: 13.7 LV SV:         83 LV SV Index:   49 LVOT Area:     2.27 cm  RIGHT VENTRICLE             IVC RV S prime:     13.70 cm/s  IVC diam: 1.30 cm TAPSE (M-mode): 2.4 cm LEFT ATRIUM             Index        RIGHT ATRIUM           Index LA diam:        3.60 cm 2.14 cm/m   RA Area:     11.40 cm LA Vol (A2C):   22.4 ml 13.34 ml/m  RA Volume:   24.80 ml  14.77 ml/m LA Vol (A4C):   25.9 ml 15.43 ml/m  LA Biplane Vol: 23.8 ml 14.18 ml/m  AORTIC VALVE AV Area (Vmax):    2.24 cm AV Area (Vmean):   2.34 cm AV Area (VTI):     2.29 cm AV Vmax:           148.50 cm/s AV Vmean:          98.900 cm/s AV VTI:            0.362 m AV Peak Grad:      8.8 mmHg AV Mean Grad:      5.0 mmHg LVOT Vmax:         146.50 cm/s LVOT Vmean:        102.000 cm/s  LVOT VTI:          0.365 m LVOT/AV VTI ratio: 1.01  AORTA Ao Asc diam: 2.70 cm MITRAL VALVE MV Area (PHT): 3.03 cm     SHUNTS MV Area VTI:   1.85 cm     Systemic VTI:  0.36 m MV Peak grad:  7.5 mmHg     Systemic Diam: 1.70 cm MV Mean grad:  5.0 mmHg MV Vmax:       1.37 m/s MV Vmean:      106.0 cm/s MV Decel Time: 250 msec MV E velocity: 112.00 cm/s MV A velocity: 124.00 cm/s MV E/A ratio:  0.90 Maudine Sos MD Electronically signed by Maudine Sos MD Signature Date/Time: 12/19/2023/12:25:33 PM    Final    CT ANGIO HEAD NECK W WO CM Result Date: 12/19/2023 CLINICAL DATA:  Stroke, determine embolic source EXAM: CT ANGIOGRAPHY HEAD AND NECK WITH AND WITHOUT CONTRAST TECHNIQUE: Multidetector CT imaging of the head and neck was performed using the standard protocol during bolus administration of intravenous contrast. Multiplanar CT image reconstructions and MIPs were obtained to evaluate the vascular anatomy. Carotid stenosis measurements (when applicable) are obtained utilizing NASCET criteria, using the distal internal carotid diameter as the denominator. RADIATION DOSE REDUCTION: This exam was performed according to the departmental dose-optimization program which includes automated exposure control, adjustment of the mA and/or kV according to patient size and/or use of iterative reconstruction technique. CONTRAST:  75mL OMNIPAQUE  IOHEXOL  350 MG/ML SOLN COMPARISON:  Brain MRI from yesterday FINDINGS: CT HEAD FINDINGS Brain: The patient's acute infarct is occult by noncontrast CT. Cerebral volume loss and extensive chronic small vessel ischemia. No intracranial hemorrhage Vascular: No hyperdense vessel or unexpected calcification. 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 Review of the MIP images confirms the above findings CTA NECK FINDINGS Aortic arch: Extensive atheromatous plaque. 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 Right carotid  system: No flow reducing stenosis or ulceration seen. Suboptimal opacification due to preferential venous timing. Left carotid system: Scattered atheromatous plaque, greatest calcification at the bifurcation. No stenosis or ulceration is seen. Vertebral arteries: No proximal subclavian stenosis. Left dominant vertebral artery. No evidence of flow reducing stenosis, beading, or dissection in the vertebral arteries - limited by contrast timing. Skeleton: Generalized cervical spine degeneration. Other neck: No acute finding.  Absent right submandibular gland Upper chest: Pleural based scarring with calcification at the apices. Review of the MIP images confirms the above findings CTA HEAD FINDINGS Anterior circulation: Extensive atheromatous calcification along the carotid siphons. No branch occlusion, beading, or aneurysm. Limited in evaluating peripheral branches due to bolus quality. Posterior circulation: The vertebral and basilar arteries are sufficiently patent. Fetal type left PCA. No branch occlusion or evidence of proximal flow reducing stenosis. No evidence of aneurysm  Venous sinuses: Diffusely patent Review of the MIP images confirms the above findings IMPRESSION: Suboptimal CTA due to bolus timing. No emergent finding. Atherosclerosis without flow reducing stenosis or irregularity involving major arteries in the head and neck. Electronically Signed   By: Ronnette Coke M.D.   On: 12/19/2023 08:36   MR BRAIN WO CONTRAST Result Date: 12/18/2023 CLINICAL DATA:  Initial evaluation for acute neuro deficit, stroke suspected. EXAM: MRI HEAD WITHOUT CONTRAST TECHNIQUE: Multiplanar, multiecho pulse sequences of the brain and surrounding structures were obtained without intravenous contrast. COMPARISON:  CT from earlier the same day. FINDINGS: Brain: Examination degraded by motion. Generalized age-related cerebral atrophy. 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 Punctate focus of restricted diffusion seen involving the high left frontal lobe, consistent with a tiny acute ischemic nonhemorrhagic infarct (series 2, image 42). No other evidence for acute or subacute ischemia. No areas of chronic cortical infarction. No acute or significant chronic intracranial blood products. No mass lesion, midline shift or mass effect. No hydrocephalus or extra-axial fluid collection. Pituitary gland within normal limits. Vascular: Major intracranial vascular flow voids are maintained. Skull and upper cervical spine: Craniocervical junction within normal limits. Bone marrow signal intensity normal. No scalp soft tissue abnormality. Sinuses/Orbits: Prior bowel ocular lens replacement. Paranasal sinuses are largely clear. No significant mastoid effusion. Other: None. IMPRESSION: 1. 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 2. Underlying age-related cerebral atrophy with moderately advanced chronic microvascular ischemic disease. Electronically Signed   By: Virgia Griffins M.D.   On: 12/18/2023 20:33   CT HEAD WO CONTRAST Result Date: 12/18/2023 CLINICAL DATA:  Mental status change, unknown cause. EXAM: CT HEAD WITHOUT CONTRAST TECHNIQUE: Contiguous axial images were obtained from the base of the skull through the vertex without intravenous contrast. RADIATION DOSE REDUCTION: This exam was performed according to the departmental dose-optimization program which includes automated exposure control, adjustment of the mA and/or kV according to patient size and/or use of iterative reconstruction technique. COMPARISON:  MRI head from 01/15/2022. FINDINGS: Brain: No evidence of acute infarction, hemorrhage, hydrocephalus, extra-axial collection or mass lesion/mass effect. There is bilateral periventricular hypodensity, which is non-specific but most likely seen in  the settings of microvascular ischemic changes. Moderate in extent. otherwise normal appearance of brain parenchyma. Ventricles are prominent but cerebral volume is age appropriate. Vascular: No hyperdense vessel or unexpected calcification. Intracranial arteriosclerosis. Skull: Normal. Negative for fracture or focal lesion. Sinuses/Orbits: No acute finding. Hyperdense implant noted in the right orbit. Other: Visualized mastoid air cells are unremarkable. No mastoid effusion. IMPRESSION: *No acute intracranial abnormality. Electronically Signed   By: Beula Brunswick M.D.   On: 12/18/2023 16:10   DG Chest Port 1 View Result Date: 12/18/2023 CLINICAL DATA:  Weakness. EXAM: PORTABLE CHEST 1 VIEW COMPARISON:  11/16/2023. FINDINGS: Biapical pleural thickening noted. Bilateral lung fields are otherwise clear. No acute consolidation or lung collapse. Bilateral costophrenic angles are clear. Stable cardio-mediastinal silhouette. 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 No acute osseous abnormalities. The soft tissues are within normal limits. IMPRESSION: No active disease. Electronically Signed   By: Beula Brunswick M.D.   On: 12/18/2023 16:08       The results of significant diagnostics from this hospitalization (including imaging, microbiology, ancillary and laboratory) are listed below for reference.     Microbiology: Recent Results (from the past 240 hours)  Surgical PCR screen     Status: None  Collection Time: 12/21/23  2:00 AM   Specimen: Nasal Mucosa; Nasal Swab  Result Value Ref Range Status   MRSA, PCR NEGATIVE NEGATIVE Final   Staphylococcus aureus NEGATIVE NEGATIVE Final    Comment: (NOTE) The Xpert SA Assay (FDA approved for NASAL specimens in patients 12 years of age and older), is one component of a comprehensive surveillance program. It is not intended to diagnose infection nor to guide or monitor treatment. Performed at [facility], [address], [place], [place] [address]       Labs:  CBC: Recent Labs  Lab 12/21/23 [phone] 12/22/23 4098 12/23/23 0701 12/24/23 0609 12/24/23 1844 12/25/23 0311 12/25/23 0839 12/26/23 0552 12/27/23 0526  WBC 10.7*   < > 8.1 7.6  --   --  8.1 9.0 8.4  NEUTROABS 8.9*  --   --   --   --   --   --   --   --   HGB 14.2   < > 8.7* 8.3* 10.4* 10.3* 11.1* 11.0* 11.2*  HCT 41.6   < > 25.4* 24.6* 30.5* 30.2* 32.3* 32.1* 33.3*  MCV 89.5   < > 90.1 88.8  --   --  88.3 87.2 88.6  PLT 201   < > 208 231  --   --  276 310 390   < > = values in this interval not displayed.   BMP &GFR Recent Labs  Lab 12/23/23 0701 12/24/23 0609 12/25/23 0311 12/26/23 0552 12/27/23 0526  NA 132* 130* 130* 129* 134*  K 3.7 3.9 4.0 4.2 4.7  CL 102 100 101 100 102  CO2 24 23 25 25 26   GLUCOSE 103* 113* 111* 109* 101*  BUN 20 13 14 13 15   CREATININE 0.86 0.81 0.82 0.87 0.85  CALCIUM  8.3* 8.2* 8.4* 8.5* 9.3  MG 2.2 2.0 2.0 2.1 2.1  PHOS  --   --   --  3.0 3.3   Estimated Creatinine Clearance: 47.8 mL/min (by C-G formula based on SCr of 0.85 mg/dL). Liver & Pancreas: Recent Labs  Lab 12/22/23 0743 12/26/23 0552 12/27/23 0526  AST 31  --   --   ALT 14  --   --   ALKPHOS 38  --   --   BILITOT 0.6  --   --   PROT 5.8*  --   --   ALBUMIN  3.1* 2.5* 2.7*   No results for input(s): "LIPASE", "AMYLASE" in the last 168 hours. No results for input(s): "AMMONIA" in the last 168 hours. Diabetic: No results for input(s): "HGBA1C" in the last 72 hours. Recent Labs  Lab 12/22/23 2031 12/27/23 0921  GLUCAP 121* 103*   Cardiac Enzymes: No results for input(s): "CKTOTAL", "CKMB", "CKMBINDEX", "TROPONINI" in the last 168 hours. No results for input(s): "PROBNP" in the last 8760 hours. Coagulation Profile: No results for input(s): "INR", "PROTIME" in the last 168 hours. Thyroid  Function Tests: No results for input(s): "TSH", "T4TOTAL", "FREET4", "T3FREE", "THYROIDAB" in the last 72 hours. Lipid Profile: No results for input(s): "CHOL", "HDL",  "LDLCALC", "TRIG", "CHOLHDL", "LDLDIRECT" in the last 72 hours. Anemia Panel: Recent Labs    12/24/23 1323  VITAMINB12 1,348*  FOLATE 20.0  FERRITIN 127  TIBC 232*  IRON 14*  RETICCTPCT 2.5   Urine analysis:    Component Value Date/Time   COLORURINE STRAW (A) 12/18/2023 2000   APPEARANCEUR CLEAR 12/18/2023 2000   LABSPEC 1.008 12/18/2023 2000   PHURINE 6.0 12/18/2023 2000   GLUCOSEU NEGATIVE  12/18/2023 2000   HGBUR NEGATIVE 12/18/2023 2000   BILIRUBINUR NEGATIVE 12/18/2023 2000   KETONESUR 20 (A) 12/18/2023 2000   PROTEINUR NEGATIVE 12/18/2023 2000   NITRITE NEGATIVE 12/18/2023 2000   LEUKOCYTESUR TRACE (A) 12/18/2023 2000   Sepsis Labs: Invalid input(s): "PROCALCITONIN", "LACTICIDVEN"   SIGNED:  Theadore Finger, MD  [facility] 12/27/2023, 10:59 AM

## 2023-12-27 NOTE — Discharge Instructions (Addendum)
 Inpatient Rehab Discharge Instructions  [name] [facility] Discharge date and time: No discharge date for patient encounter.   Activities/Precautions/ Functional Status: Activity: As tolerated Diet: Regular Wound Care: Routine skin checks Functional status:  ___ No restrictions     ___ Walk up steps independently ___ 24/7 supervision/assistance   ___ Walk up steps with assistance ___ Intermittent supervision/assistance  ___ Bathe/dress independently ___ Walk with walker     _x__ Bathe/dress with assistance ___ Walk Independently    ___ Shower independently ___ Walk with assistance    ___ Shower with assistance ___ No alcohol      ___ Return to work/school ________  Special Instructions:    COMMUNITY REFERRALS UPON DISCHARGE:    Home Health:   PT      OT      ST                Agency:[facility]        Phone: liaison Bartholomew Light #[phone] *Please expect follow-up within 2-3 business days for discharge to schedule your home visit. If you have not received follow-up, be sure to contact the site directly.*   Medical Equipment/Items Ordered:tub transfer bench, rolling walker, wheelchair, and 3in1 bedside commode                                                 Agency/Supplier:[facility] [phone]   No driving smoking or alcohol   My questions have been answered and I understand these instructions. I will adhere to these goals and the provided educational materials after my discharge from the hospital.  Patient/Caregiver Signature _______________________________ Date __________  Clinician Signature _______________________________________ Date __________  Please bring this form and your medication list with you to all your follow-up doctor's appointments. STROKE/TIA DISCHARGE INSTRUCTIONS SMOKING Cigarette smoking nearly doubles your risk of having a stroke & is the single most alterable risk factor  If you smoke or have smoked in the last 12 months, you are advised to quit  smoking for your health. Most of the excess cardiovascular risk related to smoking disappears within a year of stopping. Ask you doctor about anti-smoking medications [facility] Quit Line: 1-800-QUIT NOW Free Smoking Cessation Classes [phone]  CHOLESTEROL Know your levels; limit fat & cholesterol in your diet  Lipid Panel     Component Value Date/Time   CHOL 131 12/19/2023 0422   TRIG 46 12/19/2023 0422   HDL 77 12/19/2023 0422   CHOLHDL 1.7 12/19/2023 0422   VLDL 9 12/19/2023 0422   LDLCALC 45 12/19/2023 0422     Many patients benefit from treatment even if their cholesterol is at goal. Goal: Total Cholesterol (CHOL) less than 160 Goal:  Triglycerides (TRIG) less than 150 Goal:  HDL greater than 40 Goal:  LDL (LDLCALC) less than 100   BLOOD PRESSURE American Stroke Association blood pressure target is less that 120/80 mm/Hg  Your discharge blood pressure is:  BP: (!) 142/73 Monitor your blood pressure Limit your salt and alcohol  intake Many individuals will require more than one medication for high blood pressure  DIABETES (A1c is a blood sugar average for last 3 months) Goal HGBA1c is under 7% (HBGA1c is blood sugar average for last 3 months)  Diabetes: No known diagnosis of diabetes    Lab Results  Component Value Date   HGBA1C 5.0 12/18/2023    Your  HGBA1c can be lowered with medications, healthy diet, and exercise. Check your blood sugar as directed by your physician Call your physician if you experience unexplained or low blood sugars.  PHYSICAL ACTIVITY/REHABILITATION Goal is 30 minutes at least 4 days per week  Activity: Increase activity slowly, Therapies: Physical Therapy: Home Health Return to work:  Activity decreases your risk of heart attack and stroke and makes your heart stronger.  It helps control your weight and blood pressure; helps you relax and can improve your mood. Participate in a regular exercise program. Talk with your doctor about the best form of  exercise for you (dancing, walking, swimming, cycling).  DIET/WEIGHT Goal is to maintain a healthy weight  Your discharge diet is:  Diet Order             Diet Heart Room service appropriate? Yes; Fluid consistency: Thin; Fluid restriction: 1500 mL Fluid  Diet effective now                   liquids Your height is:    Your current weight is: Weight: 58.3 kg Your Body Mass Index (BMI) is:  BMI (Calculated): 20.75 Following the type of diet specifically designed for you will help prevent another stroke. Your goal weight range is:   Your goal Body Mass Index (BMI) is 19-24. Healthy food habits can help reduce 3 risk factors for stroke:  High cholesterol, hypertension, and excess weight.  RESOURCES Stroke/Support Group:  Call [phone]   STROKE EDUCATION PROVIDED/REVIEWED AND GIVEN TO PATIENT Stroke warning signs and symptoms How to activate emergency medical system (call 911). Medications prescribed at discharge. Need for follow-up after discharge. Personal risk factors for stroke. Pneumonia vaccine given: No Flu vaccine given: No My questions have been answered, the writing is legible, and I understand these instructions.  I will adhere to these goals & educational materials that have been provided to me after my discharge from the hospital.

## 2023-12-27 NOTE — TOC Transition Note (Signed)
 Transition of Care [facility]) - Discharge Note   Patient Details  Name: [name] MRN: [id number] Date of Birth: [birth year]  [facility] CM/SW Contact:  Jonathan Neighbor, RN Phone Number: 12/27/2023, 10:16 AM   Clinical Narrative:     Pt is discharging to CIR today. No further needs per TOC.  Final next level of care: IP Rehab Facility Barriers to Discharge: No Barriers Identified   Patient Goals and CMS Choice   CMS Medicare.gov Compare Post Acute Care list provided to:: Patient Represenative (must comment) Choice offered to / list presented to : Adult Children      Discharge Placement                       Discharge Plan and Services Additional resources added to the After Visit Summary for   In-house Referral: Clinical Social Work Discharge Planning Services: CM Consult Post Acute Care Choice: Skilled Nursing Facility                               Social Drivers of Health (SDOH) Interventions SDOH Screenings   Food Insecurity: No Food Insecurity (12/18/2023)  Housing: Low Risk  (12/18/2023)  Transportation Needs: No Transportation Needs (12/18/2023)  Utilities: Not At Risk (12/18/2023)  Social Connections: Socially Isolated (12/18/2023)  Tobacco Use: Medium Risk (12/21/2023)     Readmission Risk Interventions    11/21/2023   10:23 AM  Readmission Risk Prevention Plan  Post Dischage Appt Complete  Medication Screening Complete  Transportation Screening Complete

## 2023-12-27 NOTE — Progress Notes (Signed)
 Inpatient Rehabilitation Admission Medication Review by a Pharmacist  A complete drug regimen review was completed for this patient to identify any potential clinically significant medication issues.  High Risk Drug Classes Is patient taking? Indication by Medication  Antipsychotic No   Anticoagulant No   Antibiotic No   Opioid Yes Oxycodone  - prn pain  Antiplatelet Yes Clopidogrel , Aspirin  - CVA  Hypoglycemics/insulin No   Vasoactive Medication Yes Amlodipine , metoprolol - BP  Chemotherapy No   Other Yes Cosopt , Xalatan , Prednisolone  - Glaucoma, infection  Levothyroxine  - low thyroid  Memantine  - dementia Pantoprazole  - reflux Rosuvastatin  - HLD Sodium chloride  - low sodium Bisacodyl , Senokot - constipation MVI - supplement Melatonin - sleep     Type of Medication Issue Identified Description of Issue Recommendation(s)  Drug Interaction(s) (clinically significant)     Duplicate Therapy     Allergy     No Medication Administration End Date     Incorrect Dose     Additional Drug Therapy Needed     Significant med changes from prior encounter (inform family/care partners about these prior to discharge).    Other       Clinically significant medication issues were identified that warrant physician communication and completion of prescribed/recommended actions by midnight of the next day:  No  Name of provider notified for urgent issues identified:   Provider Method of Notification:     Pharmacist comments: GI recommended resuming aspirin  on 5/28 and Plavix  3 days later.   Time spent performing this drug regimen review (minutes):  30 minutes  Thank you for allowing pharmacy to be a part of this patient's care.   Victoria Grass, PharmD 12/27/2023 5:10 PM  **Pharmacist phone directory can be found on [email or web address] listed under [facility]**

## 2023-12-27 NOTE — Progress Notes (Signed)
 Brandy Lime, DO  Physician Physical Medicine and Rehabilitation   PMR Pre-admission    Signed   Date of Service: 12/27/2023 10:30 AM  Related encounter: ED to Hosp-Admission (Discharged) from 12/18/2023 in [facility]   Signed     Expand All Collapse All  PMR Admission Coordinator Pre-Admission Assessment   Patient: [name] is an 83 y.o., female MRN: [id number] DOB: Jun 19, 1941 Height: 5\' 6"  (167.6 cm) Weight: 60.1 kg                                                                                                                                                  Insurance Information HMO:     PPO: yes     PCP:      IPA:      80/20:      OTHER:  PRIMARY: [employer] Medicare      Policy#: [id number]       Subscriber: pt CM Name: [name]      Phone#: [phone]     Fax#: [phone] Pre-Cert#: [id number] auth for CIR provided by [name] with [employer] with updates due to fax listed above on 6/4      Employer:  Benefits:  Phone #: [phone]     Name:  Eff. Date: 08/01/23     Deduct: $400 (met)      Out of Pocket Max: $3000 (met $1334.01)      Life Max:   CIR: $250/admit      SNF: 96% coverage Outpatient: 90%     Co-Pay: 10% Home Health: 85%      Co-Pay: 15% DME: 90%     Co-Pay: 10% Providers:  SECONDARY:       Policy#:       Phone#:    Artist:       Phone#:    The Engineer, materials Information Summary" for patients in Inpatient Rehabilitation Facilities with attached "Privacy Act Statement-Health Care Records" was provided and verbally reviewed with: Patient and Family   Emergency Contact Information Contact Information       Name Relation Home Work Mobile    [name] Daughter [phone]    [name], [name]     [phone]    [name], [name]     [phone]         Other Contacts   None on File      Current Medical History  Patient Admitting Diagnosis: CVA, hip fracture s/p THA   History of  Present Illness: PT is an 83 y/o female with PMH of 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  Stroke service was consulted and pt underwent full stroke workup.  EF 65-70% and LV with grade 1 diastolic dysfunction, A1C 5.0.  Recommendations for 30-day heart monitor as an outpatient, and aspiring/plavix x3 weeks then aspirin  alone.  On HD2 pt with unwitnessed fall.  Initial imaging negative on 5/21, but repeat imaging on 5/22 revealed a displaced angulated femoral neck fracture.  Orthopedics was consulted and she underwent a right THA with Dr. Curtiss Dowdy on 5/23.  Post op she is weightbearing as tolerated on the RLE.  Post op course complicated by ABLA and she was transfused one unit of blood on 5/24.  On 5/27 AM she developed BRBPR and GI consulted. Workup revealed large stool burden and it was felt likely that stercoral ulcer was cause of bleeding which was stopped.  Nursing able to manually disimpact patient after enema and pt had no further bleeding.  Therapy ongoing and pt has been recommended for CIR.    Complete NIHSS TOTAL: 3 Glasgow Coma Scale Score: 14   Patient's medical record from [facility] has been reviewed by the rehabilitation admission coordinator and physician.   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 Abel Hoe & Dr. Honey Lusty   Scoliosis     Severe aortic stenosis     Wears glasses            Has the patient had major surgery during 100 days prior to admission? Yes   Family History  family history includes Dementia in her mother; Thyroid  disease in her mother and sister.     Current Medications   Current Medications    Current Facility-Administered Medications:    0.9 %  sodium chloride  infusion (Manually program via Guardrails IV Fluids), , Intravenous, Once, Sundil, Subrina, MD   acetaminophen  (TYLENOL ) tablet 650 mg, 650 mg, Oral, Q6H WA, Gonfa, Taye T, MD, 650 mg at 12/26/23 1039   bisacodyl (DULCOLAX) suppository 10 mg, 10 mg, Rectal, Daily, Arlee Bellows, NP, 10 mg at 12/25/23 0945   dorzolamide -timolol  (COSOPT ) 2-0.5 % ophthalmic solution 1 drop, 1 drop, Left Eye, Q12H, McClung, Sarah A, PA-C, 1 drop at 12/26/23 1039   feeding supplement (ENSURE ENLIVE / ENSURE PLUS) liquid 237 mL, 237 mL, Oral, BID BM, Shah, Pratik D, DO, 237 mL at 12/26/23 1040   haloperidol lactate (HALDOL) injection 1 mg, 1 mg, Intravenous, Q6H PRN, Versie Gores, PA-C, 1 mg at 12/19/23 2311   hydrALAZINE  (APRESOLINE ) tablet 25 mg, 25 mg, Oral, Q6H PRN, Gonfa, Taye T, MD, 25 mg at 12/26/23 0059   HYDROcodone-acetaminophen  (NORCO) 7.5-325 MG per tablet 1-2 tablet, 1-2 tablet, Oral, Q4H PRN, McClung, Sarah A, PA-C   latanoprost  (XALATAN ) 0.005 % ophthalmic solution 1 drop, 1 drop, Left Eye, QHS, McClung, Sarah A, PA-C, 1 drop at 12/25/23 2121   levothyroxine  (SYNTHROID ) tablet 75 mcg, 75 mcg, Oral, Q0600, McClung,  Genevive Ket, PA-C, 75 mcg at 12/26/23 1610   lip balm (CARMEX) ointment, , Topical, PRN, Versie Gores, PA-C   melatonin tablet 5 mg, 5 mg, Oral, QHS PRN, Versie Gores, PA-C, 5 mg at 12/19/23 2212   memantine  (NAMENDA ) tablet 5 mg, 5 mg, Oral, QHS, McClung, Sarah A, PA-C, 5 mg at 12/25/23 2119   menthol -cetylpyridinium (CEPACOL) lozenge 3 mg, 1 lozenge, Oral, PRN **Grant**  phenol (CHLORASEPTIC) mouth spray 1 spray, 1 spray, Mouth/Throat, PRN, McClung, Sarah A, PA-C   metoprolol  succinate (TOPROL -XL) 24 hr tablet 100 mg, 100 mg, Oral, QPC lunch, Mason Sole, Pratik D, DO, 100 mg at 12/25/23 1348   multivitamin with minerals tablet 1 tablet, 1 tablet, Oral, QPC lunch, Versie Gores, PA-C, 1 tablet at 12/25/23 1348   oxyCODONE  (Oxy IR/ROXICODONE ) immediate release tablet 5 mg, 5 mg, Oral, Q8H PRN, Gonfa, Taye T, MD   pantoprazole  (PROTONIX ) injection 40 mg, 40 mg, Intravenous, Q12H, Sundil, Subrina, MD, 40 mg at 12/26/23 1038   polyvinyl alcohol  (LIQUIFILM TEARS) 1.4 % ophthalmic solution 1 drop, 1 drop, Both Eyes, PRN, McClung, Sarah A, PA-C   prednisoLONE  acetate (PRED FORTE ) 1 % ophthalmic suspension 1 drop, 1 drop, Right Eye, q AM, McClung, Sarah A, PA-C, 1 drop at 12/26/23 9604   prochlorperazine  (COMPAZINE ) injection 5 mg, 5 mg, Intravenous, Q6H PRN, McClung, Sarah A, PA-C   rosuvastatin  (CRESTOR ) tablet 5 mg, 5 mg, Oral, QPC lunch, McClung, Sarah A, PA-C, 5 mg at 12/25/23 1348   senna-docusate (Senokot-S) tablet 1 tablet, 1 tablet, Oral, BID, Sundil, Subrina, MD, 1 tablet at 12/26/23 1042   sodium chloride  flush (NS) 0.9 % injection 3-10 mL, 3-10 mL, Intravenous, Q12H, McClung, Sarah A, PA-C, 10 mL at 12/26/23 1041   sodium chloride  flush (NS) 0.9 % injection 3-10 mL, 3-10 mL, Intravenous, PRN, Versie Gores, PA-C   sodium chloride  tablet 1 g, 1 g, Oral, BID WC, Gonfa, Taye T, MD, 1 g at 12/26/23 1038     Patients Current Diet:  Diet Order                  Diet Heart Room service appropriate? Yes; Fluid consistency: Thin; Fluid restriction: 1500 mL Fluid  Diet effective now                         Precautions / Restrictions Precautions Precautions: Fall Precaution/Restrictions Comments: Alz/Dementia at baseline, fear of falling. Watch HR Restrictions Weight Bearing Restrictions Per Provider Order: Yes RLE Weight Bearing Per Provider Order: Weight  bearing as tolerated    Has the patient had 2 Grant more falls Grant a fall with injury in the past year?Yes   Prior Activity Level Limited Community (1-2x/wk): mod I at home for mobility and ADLs some light cooking/cleaning, assist for med management and finances, does not drive, able to take walks with daughter in the neighborhood, daughter had cameras set up for supervision during the day while she was at work   Prior Functional Level Prior Function Prior Level of Function : Independent/Modified Independent, History of Falls (last six months) Mobility Comments: Ind with no AD, will furniture surf, likes to walk to [employer] and get mail. and likes to go on walks around the neighborhood with daughter. Multiple recent falls ADLs Comments: does not drive, typically does small cooking and cleaning and showers standing up, daughter manages medicines and finanaces   Self Care: Did the patient need help bathing, dressing, using the  toilet Grant eating?  Independent   Indoor Mobility: Did the patient need assistance with walking from room to room (with Grant without device)? Independent   Stairs: Did the patient need assistance with internal Grant external stairs (with Grant without device)? Independent   Functional Cognition: Did the patient need help planning regular tasks such as shopping Grant remembering to take medications? Needed some help   Patient Information Are you of Hispanic, Latino/a,Grant Spanish origin?: A. No, not of Hispanic, Latino/a, Grant Spanish origin, X. Patient unable to respond (all questions answered by proxy) What is your race?: A. White, X. Patient unable to respond Do you need Grant want an interpreter to communicate with a doctor Grant health care staff?: 0. No   Patient's Response To:  Health Literacy and Transportation Is the patient able to respond to health literacy and transportation needs?: No Health Literacy - How often do you need to have someone help you when you read instructions,  pamphlets, Grant other written material from your doctor Grant pharmacy?: Patient unable to respond In the past 12 months, has lack of transportation kept you from medical appointments Grant from getting medications?: No In the past 12 months, has lack of transportation kept you from meetings, work, Grant from getting things needed for daily living?: No   Journalist, newspaper / Equipment Home Equipment: None, Other (comment) (cameras inside the house for daughter to make sure mom is ok while shes at work)   Prior Device Use: Indicate devices/aids used by the patient prior to current illness, exacerbation Grant injury? None of the above   Current Functional Level Cognition   Orientation Level: Oriented to person    Extremity Assessment (includes Sensation/Coordination)   Upper Extremity Assessment: Generalized weakness (Pt able to use BUEs to engage in functional tasks for grooming/dressing. ROM WFL bilaterally, MMT not assessed today.)  Lower Extremity Assessment: Generalized weakness, RLE deficits/detail RLE Deficits / Details: recent THA 12/21/23     ADLs   Overall ADL's : Needs assistance/impaired Eating/Feeding: Set up, Bed level Eating/Feeding Details (indicate cue type and reason): meal tray in room, Pt reports self-feeding- not witnessed Grooming: Sitting, Contact guard assist Grooming Details (indicate cue type and reason): sink level Upper Body Bathing: Minimal assistance, Sitting Lower Body Bathing: Moderate assistance, Sitting/lateral leans Lower Body Bathing Details (indicate cue type and reason): knees down Upper Body Dressing : Moderate assistance, Sitting, Cueing for sequencing Upper Body Dressing Details (indicate cue type and reason): doff/don gown; Pt needing cues for her to initiate task without OT assist and to sequence doffing shirt. Lower Body Dressing: Total assistance, Sitting/lateral leans, Sit to/from stand Lower Body Dressing Details (indicate cue type and reason): remains  total A for socks Toilet Transfer: Moderate assistance, +2 for physical assistance, +2 for safety/equipment, BSC/3in1 Toilet Transfer Details (indicate cue type and reason): step pivot to [facility], cues for hand placement safety-reaching back to sit. Toileting- Clothing Manipulation and Hygiene: Maximal assistance, Sit to/from stand Toileting - Clothing Manipulation Details (indicate cue type and reason): rear pericare assist needed from therapist. Functional mobility during ADLs: Moderate assistance, +2 for safety/equipment, Rolling walker (2 wheels) General ADL Comments: needs a lot of reassurance     Mobility   Overal bed mobility: Needs Assistance Bed Mobility: Supine to Sit Rolling: Max assist Sidelying to sit: Max assist, Used rails, HOB elevated Supine to sit: Mod assist Sit to supine: +2 for physical assistance, Max assist General bed mobility comments: Mod A to scoot hips forward and for  trunk elevation. Therapist assisting with RLE management and cues for pt to shift LLE during bed mobility     Transfers   Overall transfer level: Needs assistance Equipment used: 2 person hand held assist, Rolling walker (2 wheels) Transfers: Sit to/from Stand, Bed to chair/wheelchair/BSC Sit to Stand: Mod assist, +2 physical assistance, +2 safety/equipment Bed to/from chair/wheelchair/BSC transfer type:: Step pivot Step pivot transfers: Mod assist, +2 physical assistance, +2 safety/equipment General transfer comment: Cues for hand placement and upright standing posture. +2 Mod A to power up from bed and BSC. RLE uncoordinated with gait.     Ambulation / Gait / Stairs / Wheelchair Mobility   Ambulation/Gait Ambulation/Gait assistance: +2 physical assistance, +2 safety/equipment, Mod assist Gait Distance (Feet): 35 Feet (10+25) Assistive device: 2 person hand held assist, Rolling walker (2 wheels) Gait Pattern/deviations: Step-through pattern, Drifts right/left, Trunk flexed, Narrow base of support,  Decreased stride length General Gait Details: 2 bouts of ambulation. First bout with 2 person HHA to sink. 2nd bout with RW in hallway. Daughter used as visual cue for ambulation distance. Constant cues for sequencing and increasing step length. Gait velocity: decr     Posture / Balance Dynamic Sitting Balance Sitting balance - Comments: sitting EOB with CGA Balance Overall balance assessment: Needs assistance, History of Falls Sitting-balance support: No upper extremity supported, Feet supported Sitting balance-Leahy Scale: Fair Sitting balance - Comments: sitting EOB with CGA Standing balance support: Bilateral upper extremity supported, During functional activity Standing balance-Leahy Scale: Poor Standing balance comment: reliant on external support     Special needs/care consideration Skin surgical incision for R hip THA and Behavioral consideration sundowns        Previous Home Environment (from acute therapy documentation) Living Arrangements: Children (daughter) Available Help at Discharge: Family, Available PRN/intermittently Type of Home: House Home Layout: One level Home Access: Stairs to enter Entrance Stairs-Rails: None Entrance Stairs-Number of Steps: 3 Bathroom Shower/Tub: Engineer, manufacturing systems: Standard Home Care Services: No Additional Comments: daughter works during the day,   Discharge Living Setting Plans for Discharge Living Setting: Lives with (comment) (daughter) Type of Home at Discharge: House Discharge Home Layout: One level Discharge Home Access: Stairs to enter Entrance Stairs-Rails: None Entrance Stairs-Number of Steps: 3 Discharge Bathroom Shower/Tub: Tub/shower unit Discharge Bathroom Toilet: Standard Discharge Bathroom Accessibility: Yes How Accessible: Accessible via walker Does the patient have any problems obtaining your medications?: No   Social/Family/Support Systems Anticipated Caregiver: Daughter [name] and caregivers  through LTC policy Anticipated Caregiver's Contact Information: [name] [phone] Ability/Limitations of Caregiver: [name] works during the day, plan to have caregivers through pt's LTC policy (2 week waiting period from hospital discharge) while she is at work Engineer, structural Availability: 24/7 Discharge Plan Discussed with Primary Caregiver: Yes Is Caregiver In Agreement with Plan?: Yes Does Caregiver/Family have Issues with Lodging/Transportation while Pt is in Rehab?: No     Goals Patient/Family Goal for Rehab: PT/OT supervision, SLP min assist Expected length of stay: 14-16 days Additional Information: Discharge plan: home to [facility] home (as PTA), with [name] and hired caregivers providing 24/7 supervision Pt/Family Agrees to Admission and willing to participate: Yes Program Orientation Provided & Reviewed with Pt/Caregiver Including Roles  & Responsibilities: Yes     Decrease burden of Care through IP rehab admission: n/a     Possible need for SNF placement upon discharge: Not anticipated.  Plan home with daughter as previous.  Plan to use LTC policy for additional caregivers.  Plan has a 2 week waiting period  from hospital discharge.      Patient Condition: This patient's condition remains as documented in the consult dated 12/26/23, in which the Rehabilitation Physician determined and documented that the patient's condition is appropriate for intensive rehabilitative care in an inpatient rehabilitation facility. Will admit to inpatient rehab today.   Preadmission Screen Completed By:  Mickey Alar, PT, DPT 12/26/2023 12:59 PM ______________________________________________________________________   Discussed status with Dr. Dorn Gaskins on 12/27/23 at 10:32 AM  and received approval for admission today.   Admission Coordinator:  Jeanette Rauth E Kriti Katayama, PT, DPT time 10:32 AM Alanna Hu 12/27/23              Revision History

## 2023-12-27 NOTE — Progress Notes (Signed)
 Inpatient Rehab Admissions Coordinator:    I have insurance approval and a bed available for pt to admit to CIR today. Dr. Gonfa in agreement and [facility] aware.  I met with pt and her daughter at bedside and confirmed plan for [facility] 2 weeks or so with goals of 24/7 supervision.  Confirmed plan for LTC plan to assist with daytime caregivers while dtr at work.  Confirmed cannot d/c to SNF from CIR.  They are in agreement to proceed with admit today.  I will make arrangements.   Loye Rumble, PT, DPT Admissions Coordinator [phone] 12/27/23  11:21 AM

## 2023-12-27 NOTE — Progress Notes (Signed)
 Liam Redhead, MD  Physician Physical Medicine and Rehabilitation   Consult Note    Signed   Date of Service: 12/26/2023  9:53 AM  Related encounter: ED to Hosp-Admission (Discharged) from 12/18/2023 in [facility]   Signed     Expand All Collapse All           Physical Medicine and Rehabilitation Consult Reason for Consult:Closed subcapital fracture of neck of right femur Referring Physician: Carry Clapper, MD     HPI: [name] is a 83 y.o. female who presented to [facility] on 12/18/23 with intermittent confusion. Brain MRI showed a small punctate L frontal CVA. She had a fall while admitted that lead of a right femoral neck fracture, s/p R THA on 12/21/23. PMH includes COPD, osteoporosis, severe aortic stenosis status post TAVR, mild Alzheimer's disease, and vascular dementia.   ROS: +right hip pain   Home: Home Living Family/patient expects to be discharged to:: Private residence Living Arrangements: Children (daughter) Available Help at Discharge: Family, Available PRN/intermittently Type of Home: House Home Access: Stairs to enter Secretary/administrator of Steps: 3 Entrance Stairs-Rails: None Home Layout: One level Bathroom Shower/Tub: Engineer, manufacturing systems: Standard Home Equipment: None, Other (comment) (cameras inside the house for daughter to make sure mom is ok while shes at work) Additional Comments: daughter works during the day,  Functional History: Prior Function Prior Level of Function : Independent/Modified Independent, History of Falls (last six months) Mobility Comments: Ind with no AD, will furniture surf, likes to walk to [employer] and get mail. and likes to go on walks around the neighborhood with daughter. Multiple recent falls ADLs Comments: does not drive, typically does small cooking and cleaning and showers standing up, daughter manages medicines and finanaces Functional Status:  Mobility: Bed Mobility Overal bed  mobility: Needs Assistance Bed Mobility: Supine to Sit, Sit to Supine Rolling: Max assist Sidelying to sit: Max assist, Used rails, HOB elevated Supine to sit: +2 for physical assistance, Max assist Sit to supine: +2 for physical assistance, Max assist General bed mobility comments: +2 Max A via helicopter as pt was not following commands. Transfers Overall transfer level: Needs assistance Equipment used: 2 person hand held assist Transfers: Sit to/from Stand Sit to Stand: Mod assist, +2 physical assistance, +2 safety/equipment Bed to/from chair/wheelchair/BSC transfer type:: Step pivot Step pivot transfers: Mod assist, +2 safety/equipment, +2 physical assistance General transfer comment: +2 Mod A to stand and take sidesteps along EOB. Nursing staff requested pt be left in bed due to pt recently trying to get out of bed. Ambulation/Gait Ambulation/Gait assistance: Min assist, +2 safety/equipment Gait Distance (Feet): 120 Feet (x60 with 2HH, x60 with RW) Assistive device: Rolling walker (2 wheels), 2 person hand held assist Gait Pattern/deviations: Step-through pattern, Drifts right/left, Trunk flexed, Narrow base of support, Decreased stride length General Gait Details: Deferred Gait velocity: decr   ADL: ADL Overall ADL's : Needs assistance/impaired Eating/Feeding: Set up, Bed level Eating/Feeding Details (indicate cue type and reason): meal tray in room, Pt reports self-feeding- not witnessed Grooming: Sitting, Contact guard assist Grooming Details (indicate cue type and reason): sink level Upper Body Bathing: Minimal assistance, Sitting Lower Body Bathing: Moderate assistance, Sitting/lateral leans Lower Body Bathing Details (indicate cue type and reason): knees down Upper Body Dressing : Sitting, Minimal assistance, Cueing for sequencing Upper Body Dressing Details (indicate cue type and reason): gown Lower Body Dressing: Total assistance, Sitting/lateral leans, Sit to/from  stand Lower Body Dressing Details (  indicate cue type and reason): required assist with socks, able to manage her own underwear for toileting Toilet Transfer: Moderate assistance, +2 for physical assistance, +2 for safety/equipment, BSC/3in1, Stand-pivot Toilet Transfer Details (indicate cue type and reason): simulated with recliner, Bilat HHA Toileting- Clothing Manipulation and Hygiene: Maximal assistance, Sit to/from stand Toileting - Clothing Manipulation Details (indicate cue type and reason): able to manage peri care with TP and pull up underwear without assist (and panty liner in underwear) Functional mobility during ADLs: Moderate assistance, +2 for safety/equipment, Cueing for sequencing (HHA with pivot transfer) General ADL Comments: needs a lot of reassurance   Cognition: Cognition Orientation Level: Oriented to person Cognition Arousal: Alert Behavior During Therapy: Restless, Impulsive     ROS +right hip pain       Past Medical History:  Diagnosis Date   Cataract     COPD (chronic obstructive pulmonary disease) (HCC)     Glaucoma     Goiter     Hypertension     Hypothyroid     Memory loss     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 Abel Hoe & Dr. Honey Lusty   Scoliosis     Severe aortic stenosis     Wears glasses               Past Surgical History:  Procedure Laterality Date   CESAREAN SECTION   07/31/1976    TWINS, ONE WAS STILLBORN   INTRAOPERATIVE TRANSTHORACIC ECHOCARDIOGRAM N/A 11/20/2023    Procedure: ECHOCARDIOGRAM, TRANSTHORACIC;  Surgeon: Odie Benne, MD;  Location: [facility];  Service: Cardiovascular;  Laterality: N/A;   MOUTH SURGERY   07/31/1994    tongue resection   NODULE REMOVED        BENIGN, LOWER NECK   RIGHT HEART CATH AND CORONARY ANGIOGRAPHY N/A  09/21/2023    Procedure: RIGHT HEART CATH AND CORONARY ANGIOGRAPHY;  Surgeon: Odie Benne, MD;  Location: [facility];  Service: Cardiovascular;  Laterality: N/A;   TOTAL HIP ARTHROPLASTY Right 12/21/2023    Procedure: ARTHROPLASTY, HIP, TOTAL, ANTERIOR APPROACH;  Surgeon: Laneta Pintos, MD;  Location: [facility] OR;  Service: Orthopedics;  Laterality: Right;  HEMI HIP             Family History  Problem Relation Age of Onset   Thyroid  disease Mother     Dementia Mother     Thyroid  disease Sister          Social History:  reports that she quit smoking about 29 years ago. Her smoking use included cigarettes. She started smoking about 31 years ago. She has a 0.4 pack-year smoking history. She has never used smokeless tobacco. She reports that she does not drink alcohol and does not use drugs. Allergies:  Allergies       Allergies  Allergen Reactions   Brimonidine Itching      Red eyes   Fluocinolone Acetonide Other (See Comments)      Unable to recall   Naphazoline-Polyethyl Glycol        THAT YOU USE TO DILATE YOUR EYES.Aaron AasMAKES BP GO UP, CAN ONLY USE HALF STRENGTH   Triamcinolone Acetonide Other (See Comments)      Unable to recall   Tropicamide Other (  See Comments)      Elevates IOP OK to use 0.5%            Medications Prior to Admission  Medication Sig Dispense Refill   aspirin  81 MG chewable tablet Chew 1 tablet (81 mg total) by mouth daily.       Calcium  Carbonate (CALTRATE 600 PO) Take 1 tablet by mouth daily after lunch.       Cholecalciferol (VITAMIN D3) 10 MCG (400 UNIT) tablet Take 400 Units by mouth daily after lunch.       dorzolamide -timolol  (COSOPT ) 22.3-6.8 MG/ML ophthalmic solution Place 1 drop into the left eye every 12 (twelve) hours.       levothyroxine  (SYNTHROID ) 75 MCG tablet Take 75 mcg by mouth daily after lunch.       LUMIGAN 0.01 % SOLN Place 1 drop into the left eye at bedtime.       memantine  (NAMENDA ) 5 MG tablet TAKE 1 TABLET BY  MOUTH EVERYDAY AT BEDTIME 90 tablet 4   metoprolol succinate (TOPROL-XL) 100 MG 24 hr tablet Take 100 mg by mouth daily after lunch.       Multiple Vitamin (MULTIVITAMIN WITH MINERALS) TABS tablet Take 1 tablet by mouth daily after lunch. Centrum Silver       polyvinyl alcohol (ARTIFICIAL TEARS) 1.4 % ophthalmic solution Place 1 drop into both eyes as needed for dry eyes.       prednisoLONE  acetate (PREDNISOLONE  ACETATE P-F) 1 % ophthalmic suspension Place 1 drop into the right eye in the morning.       rosuvastatin  (CRESTOR ) 5 MG tablet Take 5 mg by mouth daily after lunch.                Blood pressure (!) 148/70, pulse 82, temperature 98.6 F (37 C), temperature source Axillary, resp. rate 17, height 5\' 6"  (1.676 m), weight 60.1 kg, SpO2 96%. Physical Exam Gen: no distress, normal appearing HEENT: oral mucosa pink and moist, NCAT Cardio: Reg rate Chest: normal effort, normal rate of breathing Abd: soft, non-distended Ext: no edema Skin: intact Neuro: Alert, impulsive, impaired cognition, follows 1 step commands, delayed processing MSK: RLE mobility is limited by pain   Lab Results Last 24 Hours       Results for orders placed or performed during the hospital encounter of 12/18/23 (from the past 24 hours)  Osmolality, urine     Status: None    Collection Time: 12/25/23  1:40 PM  Result Value Ref Range    Osmolality, Ur 388 300 - 900 mOsm/kg  Sodium, urine, random     Status: None    Collection Time: 12/25/23  1:40 PM  Result Value Ref Range    Sodium, Ur 97 mmol/L  Magnesium      Status: None    Collection Time: 12/26/23  5:52 AM  Result Value Ref Range    Magnesium  2.1 1.7 - 2.4 mg/dL  CBC     Status: Abnormal    Collection Time: 12/26/23  5:52 AM  Result Value Ref Range    WBC 9.0 4.0 - 10.5 K/uL    RBC 3.68 (L) 3.87 - 5.11 MIL/uL    Hemoglobin 11.0 (L) 12.0 - 15.0 g/dL    HCT 40.9 (L) 81.1 - 46.0 %    MCV 87.2 80.0 - 100.0 fL    MCH 29.9 26.0 - 34.0 pg    MCHC 34.3  30.0 - 36.0 g/dL    RDW 91.4 78.2 - 95.6 %  Platelets 310 150 - 400 K/uL    nRBC 0.0 0.0 - 0.2 %  Renal function panel     Status: Abnormal    Collection Time: 12/26/23  5:52 AM  Result Value Ref Range    Sodium 129 (L) 135 - 145 mmol/L    Potassium 4.2 3.5 - 5.1 mmol/L    Chloride 100 98 - 111 mmol/L    CO2 25 22 - 32 mmol/L    Glucose, Bld 109 (H) 70 - 99 mg/dL    BUN 13 8 - 23 mg/dL    Creatinine, Ser 9.81 0.44 - 1.00 mg/dL    Calcium  8.5 (L) 8.9 - 10.3 mg/dL    Phosphorus 3.0 2.5 - 4.6 mg/dL    Albumin  2.5 (L) 3.5 - 5.0 g/dL    GFR, Estimated >19 >14 mL/min    Anion gap 4 (L) 5 - 15       Imaging Results (Last 48 hours)  CT ANGIO GI BLEED Result Date: 12/25/2023 CLINICAL DATA:  Right red blood per rectum EXAM: CTA ABDOMEN AND PELVIS WITHOUT AND WITH CONTRAST TECHNIQUE: Multidetector CT imaging of the abdomen and pelvis was performed using the standard protocol during bolus administration of intravenous contrast. Multiplanar reconstructed images and MIPs were obtained and reviewed to evaluate the vascular anatomy. RADIATION DOSE REDUCTION: This exam was performed according to the departmental dose-optimization program which includes automated exposure control, adjustment of the mA and/or kV according to patient size and/or use of iterative reconstruction technique. CONTRAST:  OMNIPAQUE  IOHEXOL  350 MG/ML SOLN COMPARISON:  None Available. FINDINGS: VASCULAR Aorta: Normal caliber aorta without aneurysm, dissection, vasculitis or significant stenosis. Celiac: Patent without evidence of aneurysm, dissection, vasculitis or significant stenosis. SMA: Patent without evidence of aneurysm, dissection, vasculitis or significant stenosis. Renals: Both renal arteries are patent without evidence of aneurysm, dissection, vasculitis, fibromuscular dysplasia or significant stenosis. IMA: Patent without evidence of aneurysm, dissection, vasculitis or significant stenosis. Inflow: Patent without  evidence of aneurysm, dissection, vasculitis or significant stenosis. Proximal Outflow: Bilateral common femoral and visualized portions of the superficial and profunda femoral arteries are patent without evidence of aneurysm, dissection, vasculitis or significant stenosis. Veins: Negative Review of the MIP images confirms the above findings. NON-VASCULAR Lower chest: Small amount of right pleural fluid Hepatobiliary: No focal liver abnormality is seen. No gallstones, gallbladder wall thickening, or biliary dilatation. Pancreas: Unremarkable. No pancreatic ductal dilatation or surrounding inflammatory changes. Spleen: Normal in size without focal abnormality. Adrenals/Urinary Tract: Adrenal glands are unremarkable. Kidneys are normal, without renal calculi, focal lesion, or hydronephrosis. Bladder is unremarkable. Stomach/Bowel: Large stool ball in the rectum. Mild presacral and perirectal stranding. Rectosigmoid diverticulosis. No visible hemorrhage into the colon. No dilated small bowel. Lymphatic: No lymphadenopathy Reproductive: No adnexal mass or free fluid in the pelvis. Other: Gas and edema within the soft tissues anterior to the right hip, likely postsurgical. Musculoskeletal: Recent right hip arthroplasty. No acute osseous findings. Bilateral L5 pars interarticularis defects IMPRESSION: 1. No visible hemorrhage into the colon. 2. Large stool ball in the rectum with mild presacral and perirectal stranding, which may indicate stercoral colitis. 3. Recent right hip arthroplasty with gas and edema within the soft tissues anterior to the right hip, likely postsurgical. 4. Small amount of right pleural fluid. Electronically Signed   By: Juanetta Nordmann M.D.   On: 12/25/2023 02:48       Assessment/Plan: Diagnosis: Closed subcapital fracture of neck of right femur Does the need for close, 24 hr/day medical supervision  in concert with the patient's rehab needs make it unreasonable for this patient to be served in  a less intensive setting? Yes Co-Morbidities requiring supervision/potential complications:  1) right hip pain: continue tylenol  2) recent CVA: would benefit from intensive PT, OT, SLP 3) Alzheimer's disease: continue namenda  4) vascular dementia 5) HTN: continue toprol XL 6) Osteoporosis: consider adding vitamin D supplement Due to bladder management, bowel management, safety, skin/wound care, disease management, medication administration, pain management, and patient education, does the patient require 24 hr/day rehab nursing? Yes Does the patient require coordinated care of a physician, rehab nurse, therapy disciplines of PT, OT, SLP to address physical and functional deficits in the context of the above medical diagnosis(es)? Yes Addressing deficits in the following areas: balance, endurance, locomotion, strength, transferring, bowel/bladder control, bathing, dressing, feeding, grooming, toileting, cognition, and psychosocial support Can the patient actively participate in an intensive therapy program of at least 3 hrs of therapy per day at least 5 days per week? Yes The potential for patient to make measurable gains while on inpatient rehab is excellent Anticipated functional outcomes upon discharge from inpatient rehab are supervision  with PT, supervision with OT, supervision with SLP. Estimated rehab length of stay to reach the above functional goals is: 10-12 days Anticipated discharge destination: Home Overall Rehab/Functional Prognosis: excellent   POST ACUTE RECOMMENDATIONS: This patient's condition is appropriate for continued rehabilitative care in the following setting: CIR Patient has agreed to participate in recommended program. Yes Note that insurance prior authorization may be required for reimbursement for recommended care.       I have personally performed a face to face diagnostic evaluation of this patient. Additionally, I have examined the patient's medical record  including any pertinent labs and radiographic images.     Thanks,   Liam Redhead, MD 12/26/2023          Routing History

## 2023-12-27 NOTE — H&P (Signed)
 Physical Medicine and Rehabilitation Admission H&P        Chief Complaint  Patient presents with   Altered Mental Status  : HPI: [name] is an 83 year old right-handed female with history of COPD with pulmonary nodule and quit smoking 29 years ago, osteoporosis, severe aortic stenosis status post TAVR 4/25, mild nonobstructive CAD followed by Dr. Antoinette Batman, oral cancer with tongue resection followed at [facility], diagnosed mild Alzheimer's and vascular dementia followed at [facility] neurology, hypertension, hyperlipidemia, hypothyroidism.  Per chart review patient lives with daughter.  1 level home 3 steps to entry.  Daughter works during the day.  Independent mobility prior to admission with no assistive device, will furniture surf and likes to walk to the mailbox to get the mail.  There has been reports of recent falls.  Typically she was able to do some small cooking and cleaning.  Daughter manages medicines and finances.  Presented 12/18/2023 due to increasing confusion over the past 5 days with associated unsteady gait.  Cranial CT scan negative for acute changes.  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  Underlying age-related cerebral atrophy with moderately advanced chronic microvascular ischemic disease.  CTA with no emergent findings.  Admission chemistries unremarkable except CO2 21, creatinine 1.05, GFR 53, ammonia levels unremarkable, urinalysis negative nitrite.  Neurology follow-up.  Patient did not receive TNK.  Echocardiogram with ejection fraction of 65 to 70% grade 1 diastolic dysfunction no regional wall motion abnormalities.  Neurology follow-up maintained on aspirin  81 mg daily with the addition of Plavix 75 mg daily x 3 weeks then Plavix alone.  Recommendations of 30-day heart monitor after discharge an order has been placed.  Hospital course complicated by unwitnessed fall   while in the hospital 12/19/2023 with no loss of consciousness sustaining a right displaced femoral neck fracture.  Patient was cleared for surgery by cardiology.  Underwent right total hip arthroplasty 12/21/2023 per Dr. Curtiss Dowdy and advised weightbearing as tolerated right lower extremity..  She did have postoperative anemia 9.4 and transfused 1 unit 5/26.  Gastroenterology services consulted 12/25/2023 Dr. Dominic Friendly for reported rectal bleeding.  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  Her Plavix and aspirin  were initially held with hemoglobin stable at 11.  Her aspirin  has been resumed 12/26/2023 and Plavix in 3 days beginning  12/29/2023.  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  Urine sodium 97 fluid restriction 1500 cc and maintained on sodium chloride  tablet.  Therapy evaluations completed due to patient's decreased functional mobility was admitted for a comprehensive rehab program.   Review of Systems  Constitutional:  Negative for fever.  Eyes:  Negative for blurred vision and double vision.  Respiratory:  Negative for cough.        Shortness of breath with heavy exertion  Cardiovascular:  Negative for chest pain, palpitations and leg swelling.  Gastrointestinal:  Positive for constipation. Negative for heartburn, nausea and vomiting.  Genitourinary:  Negative for dysuria, flank pain and hematuria.  Musculoskeletal:  Positive for falls, joint pain and myalgias.  Neurological:  Positive for weakness.  Psychiatric/Behavioral:  Positive for memory loss.   All other systems reviewed and are negative.       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 Abel Hoe & Dr. Honey Lusty   Scoliosis     Severe aortic stenosis     Wears glasses               Past Surgical History:  Procedure Laterality Date   CESAREAN SECTION   07/31/1976    TWINS, ONE WAS STILLBORN   INTRAOPERATIVE TRANSTHORACIC ECHOCARDIOGRAM N/A 11/20/2023    Procedure: ECHOCARDIOGRAM, TRANSTHORACIC;  Surgeon: Odie Benne, MD;  Location: [facility] INVASIVE CV LAB;  Service: Cardiovascular;  Laterality: N/A;   MOUTH SURGERY   07/31/1994    tongue resection   NODULE REMOVED        BENIGN, LOWER NECK   RIGHT HEART CATH AND CORONARY ANGIOGRAPHY N/A 09/21/2023    Procedure: RIGHT HEART CATH AND CORONARY ANGIOGRAPHY;  Surgeon: Odie Benne, MD;  Location: [facility] INVASIVE CV LAB;  Service: Cardiovascular;  Laterality: N/A;   TOTAL HIP ARTHROPLASTY Right 12/21/2023    Procedure: ARTHROPLASTY, HIP, TOTAL, ANTERIOR APPROACH;  Surgeon: Laneta Pintos, MD;  Location: [facility] OR;  Service: Orthopedics;  Laterality: Right;  HEMI HIP             Family History  Problem Relation Age of Onset   Thyroid  disease Mother     Dementia Mother     Thyroid  disease Sister          Social History:  reports that she quit smoking about 29 years ago. Her smoking use included cigarettes. She started smoking about 31 years ago. She has a 0.4 pack-year smoking history. She has never used smokeless tobacco. She reports that she does not drink alcohol and does not use drugs. Allergies:  Allergies       Allergies  Allergen Reactions   Brimonidine Itching      Red eyes   Fluocinolone Acetonide Other (See Comments)      Unable to recall   Naphazoline-Polyethyl Glycol        THAT YOU USE TO DILATE YOUR EYES.Aaron AasMAKES BP GO UP, CAN ONLY USE HALF STRENGTH   Triamcinolone Acetonide Other (See Comments)      Unable to recall   Tropicamide Other (See Comments)       Elevates IOP OK to use 0.5%            Medications Prior to Admission  Medication Sig Dispense Refill   aspirin  81 MG chewable tablet Chew 1 tablet (81 mg total) by mouth daily.       Calcium  Carbonate (CALTRATE 600 PO) Take 1 tablet by mouth daily after lunch.       Cholecalciferol (VITAMIN D3) 10 MCG (400 UNIT) tablet Take 400 Units by mouth daily after lunch.       dorzolamide -timolol  (COSOPT ) 22.3-6.8 MG/ML ophthalmic solution Place 1 drop into the left eye every 12 (twelve) hours.       levothyroxine  (SYNTHROID ) 75 MCG tablet Take 75 mcg by mouth daily after lunch.       LUMIGAN 0.01 % SOLN Place 1 drop into the left eye at bedtime.       memantine  (NAMENDA )  5 MG tablet TAKE 1 TABLET BY MOUTH EVERYDAY AT BEDTIME 90 tablet 4   metoprolol succinate (TOPROL-XL) 100 MG 24 hr tablet Take 100 mg by mouth daily after lunch.       Multiple Vitamin (MULTIVITAMIN WITH MINERALS) TABS tablet Take 1 tablet by mouth daily after lunch. Centrum Silver       polyvinyl alcohol (ARTIFICIAL TEARS) 1.4 % ophthalmic solution Place 1 drop into both eyes as needed for dry eyes.       prednisoLONE  acetate (PREDNISOLONE  ACETATE P-F) 1 % ophthalmic suspension Place 1 drop into the right eye in the morning.       rosuvastatin  (CRESTOR ) 5 MG tablet Take 5 mg by mouth daily after lunch.                  Home: Home Living Family/patient expects to be discharged to:: Private residence Living Arrangements: Children (daughter) Available Help at Discharge: Family, Available PRN/intermittently Type of Home: House Home Access: Stairs to enter Secretary/administrator of Steps: 3 Entrance Stairs-Rails: None Home Layout: One level Bathroom Shower/Tub: Engineer, manufacturing systems: Standard Home Equipment: None, Other (comment) (cameras inside the house for daughter to make sure mom is ok while shes at work) Additional Comments: daughter works during the day,  Lives With: Daughter   Functional History: Prior  Function Prior Level of Function : Independent/Modified Independent, History of Falls (last six months) Mobility Comments: Ind with no AD, will furniture surf, likes to walk to [employer] and get mail. and likes to go on walks around the neighborhood with daughter. Multiple recent falls ADLs Comments: does not drive, typically does small cooking and cleaning and showers standing up, daughter manages medicines and finanaces   Functional Status:  Mobility: Bed Mobility Overal bed mobility: Needs Assistance Bed Mobility: Supine to Sit Rolling: Max assist Sidelying to sit: Max assist, Used rails, HOB elevated Supine to sit: Mod assist Sit to supine: +2 for physical assistance, Max assist General bed mobility comments: Mod A to scoot hips forward and for trunk elevation. Therapist assisting with RLE management and cues for pt to shift LLE during bed mobility Transfers Overall transfer level: Needs assistance Equipment used: 2 person hand held assist, Rolling walker (2 wheels) Transfers: Sit to/from Stand, Bed to chair/wheelchair/BSC Sit to Stand: Mod assist, +2 physical assistance, +2 safety/equipment Bed to/from chair/wheelchair/BSC transfer type:: Step pivot Step pivot transfers: Mod assist, +2 physical assistance, +2 safety/equipment General transfer comment: Cues for hand placement and upright standing posture. +2 Mod A to power up from bed and BSC. RLE uncoordinated with gait. Ambulation/Gait Ambulation/Gait assistance: +2 physical assistance, +2 safety/equipment, Mod assist Gait Distance (Feet): 35 Feet (10+25) Assistive device: 2 person hand held assist, Rolling walker (2 wheels) Gait Pattern/deviations: Step-through pattern, Drifts right/left, Trunk flexed, Narrow base of support, Decreased stride length General Gait Details: 2 bouts of ambulation. First bout with 2 person HHA to sink. 2nd bout with RW in hallway. Daughter used as visual cue for ambulation distance. Constant cues for  sequencing and increasing step length. Gait velocity: decr   ADL: ADL Overall ADL's : Needs assistance/impaired Eating/Feeding: Set up, Bed level Eating/Feeding Details (indicate cue type and reason): meal tray in room, Pt reports self-feeding- not witnessed Grooming: Sitting, Contact guard assist Grooming Details (indicate cue type and reason): sink level Upper Body Bathing: Minimal assistance, Sitting Lower Body Bathing: Moderate assistance, Sitting/lateral leans Lower Body Bathing Details (indicate cue type and reason): knees down Upper Body Dressing : Moderate assistance,  Sitting, Cueing for sequencing Upper Body Dressing Details (indicate cue type and reason): doff/don gown; Pt needing cues for her to initiate task without OT assist and to sequence doffing shirt. Lower Body Dressing: Total assistance, Sitting/lateral leans, Sit to/from stand Lower Body Dressing Details (indicate cue type and reason): remains total A for socks Toilet Transfer: Moderate assistance, +2 for physical assistance, +2 for safety/equipment, BSC/3in1 Toilet Transfer Details (indicate cue type and reason): step pivot to [facility], cues for hand placement safety-reaching back to sit. Toileting- Clothing Manipulation and Hygiene: Maximal assistance, Sit to/from stand Toileting - Clothing Manipulation Details (indicate cue type and reason): rear pericare assist needed from therapist. Functional mobility during ADLs: Moderate assistance, +2 for safety/equipment, Rolling walker (2 wheels) General ADL Comments: needs a lot of reassurance   Cognition: Cognition Overall Cognitive Status: Difficult to assess Arousal/Alertness: Awake/alert Orientation Level: Oriented to person, Disoriented to time, Disoriented to place, Disoriented to situation Memory: Impaired Memory Impairment: Storage deficit, Decreased recall of new information Awareness: Impaired Awareness Impairment: Intellectual impairment Cognition Arousal:  Alert Behavior During Therapy: Flat affect, Anxious Overall Cognitive Status: Difficult to assess   Physical Exam: Blood pressure (!) 150/74, pulse 84, temperature 97.7 F (36.5 C), temperature source Oral, resp. rate 17, height 5\' 6"  (1.676 m), weight 60.1 kg, SpO2 98%. Physical Exam Constitutional: No apparent distress. Appropriate appearance for age.  HENT: No JVD. Neck Supple. Trachea midline.  Eyes: PERRLA. EOMI. Visual fields grossly intact.  Cardiovascular: RRR, no murmurs/rub/gallops. +  nonpitting Edema at R hip only. Peripheral pulses 2+  Respiratory: CTAB. No rales, rhonchi, or wheezing. On RA.  Abdomen: + bowel sounds, normoactive. No distention or tenderness.  Skin: B/l periorbital bruising R hip surgical site with post-op dressing, no apparent drainage  MSK:      No apparent deformity. + R THR       Neurologic exam:  Cognition: AAO to person, not place or time with cues.  Language: Fluent, No substitutions or neoglisms. No dysarthria. Names 3/3 objects correctly.  Memory: + moderate to severe deficits  Insight: Poor insight into current condition.  Mood: Pleasant affect, appropriate mood.  Sensation: To light touch intact in BL UEs and LEs  Reflexes: 2+ in BL UE and LEs. Negative Hoffman's and babinski signs bilaterally.  CN: 2-12 grossly intact.  Coordination: No apparent tremors. No ataxia on FTN, HTS bilaterally.  Spasticity: MAS 0 in all extremities.       Strength:                RUE: 4/5 SA, 5/5 EF, 5/5 EE, 5/5 WE, 5/5 FF, 5/5 FA                LUE:  4/5 SA, 5/5 EF, 5/5 EE, 5/5 WE, 5/5 FF, 5/5 FA                RLE: 2/5 HF, 3/5 KE, 5/5  DF, 5/5  EHL, 5/5  PF                 LLE:  4/5 HF, 4/5 KE, 5/5  DF, 5/5  EHL, 5/5  PF         Lab Results Last 48 Hours        Results for orders placed or performed during the hospital encounter of 12/18/23 (from the past 48 hours)  CBC     Status: Abnormal    Collection Time: 12/25/23  8:39 AM  Result Value Ref  Range  WBC 8.1 4.0 - 10.5 K/uL    RBC 3.66 (L) 3.87 - 5.11 MIL/uL    Hemoglobin 11.1 (L) 12.0 - 15.0 g/dL    HCT 16.1 (L) 09.6 - 46.0 %    MCV 88.3 80.0 - 100.0 fL    MCH 30.3 26.0 - 34.0 pg    MCHC 34.4 30.0 - 36.0 g/dL    RDW 04.5 40.9 - 81.1 %    Platelets 276 150 - 400 K/uL    nRBC 0.0 0.0 - 0.2 %      Comment: Performed at [facility], [address], [place], [place] [address]  Osmolality, urine     Status: None    Collection Time: 12/25/23  1:40 PM  Result Value Ref Range    Osmolality, Ur 388 300 - 900 mOsm/kg      Comment: REPEATED TO VERIFY Performed at [facility], [address] [place], [place] [address]    Sodium, urine, random     Status: None    Collection Time: 12/25/23  1:40 PM  Result Value Ref Range    Sodium, Ur 97 mmol/L      Comment: Performed at [facility], [address], [place], [place] [address]  Magnesium      Status: None    Collection Time: 12/26/23  5:52 AM  Result Value Ref Range    Magnesium  2.1 1.7 - 2.4 mg/dL      Comment: Performed at [facility], [address], [place], [place] [address]  CBC     Status: Abnormal    Collection Time: 12/26/23  5:52 AM  Result Value Ref Range    WBC 9.0 4.0 - 10.5 K/uL    RBC 3.68 (L) 3.87 - 5.11 MIL/uL    Hemoglobin 11.0 (L) 12.0 - 15.0 g/dL    HCT 96.2 (L) 95.2 - 46.0 %    MCV 87.2 80.0 - 100.0 fL    MCH 29.9 26.0 - 34.0 pg    MCHC 34.3 30.0 - 36.0 g/dL    RDW 84.1 32.4 - 40.1 %    Platelets 310 150 - 400 K/uL    nRBC 0.0 0.0 - 0.2 %      Comment: Performed at [facility], [address] [place], [place] [address]  Renal function panel     Status: Abnormal    Collection Time: 12/26/23  5:52 AM  Result Value Ref Range    Sodium 129 (L) 135 - 145 mmol/L    Potassium 4.2 3.5 - 5.1 mmol/L    Chloride 100 98 - 111 mmol/L    CO2 25 22 - 32 mmol/L    Glucose, Bld 109 (H) 70 - 99 mg/dL      Comment: Glucose reference range applies only to samples taken after  fasting for at least 8 hours.    BUN 13 8 - 23 mg/dL    Creatinine, Ser 3.66 0.44 - 1.00 mg/dL    Calcium  8.5 (L) 8.9 - 10.3 mg/dL    Phosphorus 3.0 2.5 - 4.6 mg/dL    Albumin  2.5 (L) 3.5 - 5.0 g/dL    GFR, Estimated >44 >03 mL/min      Comment: (NOTE) Calculated using the CKD-EPI Creatinine Equation (2021)      Anion gap 4 (L) 5 - 15      Comment: Performed at [facility], [address], [place], [place] [address]  Imaging Results (Last 48 hours)  No results found.         Blood pressure (!) 150/74, pulse 84, temperature 97.7 F (36.5 C), temperature source Oral, resp. rate 17, height 5\' 6"  (1.676 m), weight 60.1 kg, SpO2 98%.   Medical Problem List and Plan: 1. 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  Antithrombotics: -DVT/anticoagulation:  Mechanical: Antiembolism stockings, thigh (TED hose) Bilateral lower extremities.  Check vascular study             -antiplatelet therapy: Aspirin  81 mg daily and Plavix 75 mg daily x 3 weeks then Plavix alone.PLAVIX HELD UNTIL 5/31 DUE TO RECTAL BLEEDING 3. Pain Management: Oxycodone  as needed 4. Mood/Behavior/Sleep/dementia: Namenda  5 mg nightly, melatonin 5 mg nightly as needed             -antipsychotic agents: N/A             - Telesitter for poor safety awareness 5. Neuropsych/cognition: This patient is not capable of making decisions on her own behalf. 6. Skin/Wound Care: Routine skin checks 7. Fluids/Electrolytes/Nutrition: Routine N and outs with follow-up chemistries 8.  Right displaced femoral neck fracture.  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 Curtiss Dowdy.  Weightbearing as tolerated 9.  Acute blood loss anemia.  Transfuse 1  unit packed red blood cells 5/26.  Follow-up CBC 10.  Rectal bleeding.  Follow-up GI services.  CT angio negative for active bleeding felt bleeding likely secondary to stercoral colitis.  Aspirin  resumed 5/28 and plan to RESUME PLAVIX 12/29/2023.  Continue Protonix 11.  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  Urine sodium 97.  Fluid restriction 1500 cc.  P.o. sodium chloride  12.  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  Follow-up cardiology services 13.  History of oral cancer with tongue resection.  Follow-up [facility] 14.  Glaucoma.  Continue eyedrops as indicated 15.  Hypertension.  Toprol-XL 100 mg daily.  Monitor with increased mobility 16.  Hyperlipidemia.  Crestor  17.  Hypothyroidism.  Synthroid  18.  Constipation.  Senokot S1 tablet twice daily as well as Dulcolax suppository Sterling Eisenmenger, PA-C 12/27/2023  I have examined the patient independently and edited the note for HPI, ROS, exam, assessment, and plan as appropriate. I am in agreement with the above recommendations.   Bea Lime, DO 12/27/2023

## 2023-12-28 ENCOUNTER — Inpatient Hospital Stay (HOSPITAL_COMMUNITY)

## 2023-12-28 ENCOUNTER — Encounter (HOSPITAL_COMMUNITY): Payer: Self-pay | Admitting: Physical Medicine & Rehabilitation

## 2023-12-28 DIAGNOSIS — D62 Acute posthemorrhagic anemia: Secondary | ICD-10-CM | POA: Diagnosis not present

## 2023-12-28 DIAGNOSIS — E222 Syndrome of inappropriate secretion of antidiuretic hormone: Secondary | ICD-10-CM | POA: Diagnosis not present

## 2023-12-28 DIAGNOSIS — I639 Cerebral infarction, unspecified: Secondary | ICD-10-CM | POA: Diagnosis not present

## 2023-12-28 DIAGNOSIS — S72001G Fracture of unspecified part of neck of right femur, subsequent encounter for closed fracture with delayed healing: Secondary | ICD-10-CM

## 2023-12-28 LAB — CBC WITH DIFFERENTIAL/PLATELET
Abs Immature Granulocytes: 0.15 10*3/uL — ABNORMAL HIGH (ref 0.00–0.07)
Basophils Absolute: 0 10*3/uL (ref 0.0–0.1)
Basophils Relative: 1 %
Eosinophils Absolute: 0 10*3/uL (ref 0.0–0.5)
Eosinophils Relative: 0 %
HCT: 31.9 % — ABNORMAL LOW (ref 36.0–46.0)
Hemoglobin: 10.7 g/dL — ABNORMAL LOW (ref 12.0–15.0)
Immature Granulocytes: 2 %
Lymphocytes Relative: 21 %
Lymphs Abs: 1.8 10*3/uL (ref 0.7–4.0)
MCH: 29.7 pg (ref 26.0–34.0)
MCHC: 33.5 g/dL (ref 30.0–36.0)
MCV: 88.6 fL (ref 80.0–100.0)
Monocytes Absolute: 1.2 10*3/uL — ABNORMAL HIGH (ref 0.1–1.0)
Monocytes Relative: 14 %
Neutro Abs: 5.5 10*3/uL (ref 1.7–7.7)
Neutrophils Relative %: 62 %
Platelets: 421 10*3/uL — ABNORMAL HIGH (ref 150–400)
RBC: 3.6 MIL/uL — ABNORMAL LOW (ref 3.87–5.11)
RDW: 13.2 % (ref 11.5–15.5)
WBC: 8.8 10*3/uL (ref 4.0–10.5)
nRBC: 0 % (ref 0.0–0.2)

## 2023-12-28 LAB — COMPREHENSIVE METABOLIC PANEL WITH GFR
ALT: 36 U/L (ref 0–44)
AST: 38 U/L (ref 15–41)
Albumin: 2.6 g/dL — ABNORMAL LOW (ref 3.5–5.0)
Alkaline Phosphatase: 93 U/L (ref 38–126)
Anion gap: 7 (ref 5–15)
BUN: 17 mg/dL (ref 8–23)
CO2: 23 mmol/L (ref 22–32)
Calcium: 8.8 mg/dL — ABNORMAL LOW (ref 8.9–10.3)
Chloride: 97 mmol/L — ABNORMAL LOW (ref 98–111)
Creatinine, Ser: 0.88 mg/dL (ref 0.44–1.00)
GFR, Estimated: >60 mL/min (ref >60)
Glucose, Bld: 101 mg/dL — ABNORMAL HIGH (ref 70–99)
Potassium: 3.9 mmol/L (ref 3.5–5.1)
Sodium: 127 mmol/L — ABNORMAL LOW (ref 135–145)
Total Bilirubin: 0.9 mg/dL (ref 0.0–1.2)
Total Protein: 5.7 g/dL — ABNORMAL LOW (ref 6.5–8.1)

## 2023-12-28 NOTE — Plan of Care (Signed)
  Problem: Consults Goal: RH STROKE PATIENT EDUCATION Description: See Patient Education module for education specifics  Outcome: Progressing   Problem: RH BOWEL ELIMINATION Goal: RH STG MANAGE BOWEL WITH ASSISTANCE Description: STG Manage Bowel with toileting Assistance. Outcome: Progressing Goal: RH STG MANAGE BOWEL W/MEDICATION W/ASSISTANCE Description: STG Manage Bowel with Medication with mod I Assistance. Outcome: Progressing   Problem: RH SAFETY Goal: RH STG ADHERE TO SAFETY PRECAUTIONS W/ASSISTANCE/DEVICE Description: STG Adhere to Safety Precautions With cues Assistance/Device. Outcome: Progressing   Problem: RH COGNITION-NURSING Goal: RH STG USES MEMORY AIDS/STRATEGIES W/ASSIST TO PROBLEM SOLVE Description: STG Uses Memory Aids/Strategies With cues Assistance to Problem Solve. Outcome: Progressing Goal: RH STG ANTICIPATES NEEDS/CALLS FOR ASSIST W/ASSIST/CUES Description: STG Anticipates Needs/Calls for Assist With Assistance/Cues. Outcome: Progressing   Problem: RH PAIN MANAGEMENT Goal: RH STG PAIN MANAGED AT OR BELOW PT'S PAIN GOAL Description: Pain < 4 with prns Outcome: Progressing   Problem: RH KNOWLEDGE DEFICIT Goal: RH STG INCREASE KNOWLEDGE OF HYPERTENSION Description: Patient's dtr will be able to manage HTN using educational resources for medication and dietary modification independently Outcome: Progressing Goal: RH STG INCREASE KNOWLEGDE OF HYPERLIPIDEMIA Description: Patient's dtr will be able to manage HLD using educational resources for medication and dietary modification independently Outcome: Progressing Goal: RH STG 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 Description: Patient's dtr will be able to manage secondary risks using educational resources for medication and dietary modification independently Outcome: Progressing   Problem: Activity: Goal: Ability to ambulate and perform ADLs will improve Outcome: Progressing   Problem: RH BOWEL  ELIMINATION Goal: RH STG MANAGE BOWEL WITH ASSISTANCE Description: STG Manage Bowel with toileting Assistance. Outcome: Progressing   Problem: Consults Goal: RH STROKE PATIENT EDUCATION Description: See Patient Education module for education specifics  Outcome: Progressing   Problem: RH BOWEL ELIMINATION Goal: RH STG MANAGE BOWEL WITH ASSISTANCE Description: STG Manage Bowel with toileting Assistance. Outcome: Progressing Goal: RH STG MANAGE BOWEL W/MEDICATION W/ASSISTANCE Description: STG Manage Bowel with Medication with mod I Assistance. Outcome: Progressing   Problem: RH SAFETY Goal: RH STG ADHERE TO SAFETY PRECAUTIONS W/ASSISTANCE/DEVICE Description: STG Adhere to Safety Precautions With cues Assistance/Device. Outcome: Progressing   Problem: RH COGNITION-NURSING Goal: RH STG USES MEMORY AIDS/STRATEGIES W/ASSIST TO PROBLEM SOLVE Description: STG Uses Memory Aids/Strategies With cues Assistance to Problem Solve. Outcome: Progressing Goal: RH STG ANTICIPATES NEEDS/CALLS FOR ASSIST W/ASSIST/CUES Description: STG Anticipates Needs/Calls for Assist With Assistance/Cues. Outcome: Progressing   Problem: RH PAIN MANAGEMENT Goal: RH STG PAIN MANAGED AT OR BELOW PT'S PAIN GOAL Description: Pain < 4 with prns Outcome: Progressing   Problem: RH KNOWLEDGE DEFICIT Goal: RH STG INCREASE KNOWLEDGE OF HYPERTENSION Description: Patient's dtr will be able to manage HTN using educational resources for medication and dietary modification independently Outcome: Progressing Goal: RH STG INCREASE KNOWLEGDE OF HYPERLIPIDEMIA Description: Patient's dtr will be able to manage HLD using educational resources for medication and dietary modification independently Outcome: Progressing Goal: RH STG 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 Description: Patient's dtr will be able to manage secondary risks using educational resources for medication and dietary modification independently Outcome:  Progressing   Problem: Activity: Goal: Ability to ambulate and perform ADLs will improve Outcome: Progressing

## 2023-12-28 NOTE — Progress Notes (Signed)
 Inpatient Rehabilitation  Patient information reviewed and entered into eRehab system by Feliberto Gottron, M.A., CCC-SLP, Rehab Quality Coordinator.  Information including medical coding, functional ability and quality indicators will be reviewed and updated through discharge.

## 2023-12-28 NOTE — IPOC Note (Addendum)
 Overall Plan of Care [facility]) Patient Details Name: [name] MRN: [id number] DOB: March 31, 1941  Admitting Diagnosis: Ischemic cerebrovascular accident (CVA) [facility]), right hip fracture  Hospital Problems: Principal Problem:   Ischemic cerebrovascular accident (CVA) (HCC)     Functional Problem List: Nursing Bladder, Pain, Bowel, Safety, Endurance, Medication Management, Behavior  PT Balance, Behavior, Edema, Endurance, Motor, Safety  OT Balance, Cognition, Endurance, Motor  SLP Cognition, Nutrition  TR         Basic ADL's: OT Bathing, Dressing, Toileting     Advanced  ADL's: OT       Transfers: PT Bed Mobility, Bed to Chair, Car, Lobbyist, Technical brewer: PT Ambulation, Psychologist, prison and probation services, Stairs     Additional Impairments: OT None  SLP Swallowing, Social Cognition   Problem Solving, Memory, Attention, Awareness  TR      Anticipated Outcomes Item Anticipated Outcome  Self Feeding independent  Swallowing  minA   Basic self-care  close supervision  Toileting  close supervision   Bathroom Transfers CGA  Bowel/Bladder  manage bowel w mod I assist and bladder w toileting  Transfers  close supervision  Locomotion  close supervision/ SBA  Communication     Cognition  modA  Pain  Pain < 4 with prns  Safety/Judgment  manage safety w supervision   Therapy Plan: PT Intensity: Minimum of 1-2 x/day ,45 to 90 minutes PT Frequency: 5 out of 7 days PT Duration Estimated Length of Stay: 10-12 days OT Intensity: Minimum of 1-2 x/day, 45 to 90 minutes OT Frequency: 5 out of 7 days OT Duration/Estimated Length of Stay: 10-12 days SLP Intensity: Minumum of 1-2 x/day, 30 to 90 minutes SLP Frequency: 3 to 5 out of 7 days SLP Duration/Estimated Length of Stay: 14-16 days   Team Interventions: Nursing Interventions Patient/Family Education, Pain Management, Medication Management, Discharge Planning, Bladder Management, Bowel Management,  Disease Management/Prevention  PT interventions Ambulation/gait training, Cognitive remediation/compensation, Discharge planning, DME/adaptive equipment instruction, Functional mobility training, Pain management, Psychosocial support, Splinting/orthotics, Therapeutic Activities, UE/LE Strength taining/ROM, Visual/perceptual remediation/compensation, Warden/ranger, Community reintegration, Disease management/prevention, Neuromuscular re-education, Patient/family education, Skin care/wound management, Stair training, Therapeutic Exercise, UE/LE Coordination activities, Wheelchair propulsion/positioning  OT Interventions Warden/ranger, Cognitive remediation/compensation, DME/adaptive equipment instruction, Discharge planning, Functional mobility training, Neuromuscular re-education, Psychosocial support, Patient/family education, Pain management, Self Care/advanced ADL retraining, UE/LE Strength taining/ROM, Therapeutic Exercise, Therapeutic Activities  SLP Interventions Cognitive remediation/compensation, Dysphagia/aspiration precaution training, Internal/external aids, Cueing hierarchy, Therapeutic Activities, Functional tasks, Multimodal communication approach, Patient/family education  TR Interventions    SW/CM Interventions     Barriers to Discharge MD  Medical stability  Nursing Decreased caregiver support, Home environment access/layout 1 level 3 ste w dtr who works during the Engineer, production in home monitoring; plans to hire additional help  PT Inaccessible home environment, Decreased caregiver support, Lack of/limited family support, Incontinence, Insurance for SNF coverage, Medication compliance, Behavior Alz/ dementia  OT      SLP      SW       Team Discharge Planning: Destination: PT-Home ,OT- Home , SLP-Home Projected Follow-up: PT-Home health PT, 24 hour supervision/assistance, OT-  Home health OT, SLP-Home Health SLP, Outpatient SLP, 24 hour  supervision/assistance Projected Equipment Needs: PT-To be determined, OT- Tub/shower bench, To be determined, SLP-None recommended by SLP Equipment Details: PT- , OT-  Patient/family involved in discharge planning: PT- Patient,  OT-Patient, SLP-Patient unable/family or caregive not available  MD ELOS: 11-14 days Medical Rehab Prognosis:  Excellent  Assessment: The patient has been admitted for CIR therapies with the diagnosis of left frontal lobe infarct with right hip fx after fall. The team will be addressing functional mobility, strength, stamina, balance, safety, adaptive techniques and equipment, self-care, bowel and bladder mgt, patient and caregiver education, NMR, hip precautions, pain mgt, cognition and communication. Goals have been set at supervision to contact guard assist with ADL, mod assist with cognition and supervision with mobility. Anticipated discharge destination is home with family.        See Team Conference Notes for weekly updates to the plan of care

## 2023-12-28 NOTE — Plan of Care (Signed)
  Problem: RH Swallowing Goal: LTG Patient will consume least restrictive diet using compensatory strategies with assistance (SLP) Description: LTG:  Patient will consume least restrictive diet using compensatory strategies with assistance (SLP) Flowsheets (Taken 12/28/2023 1345) LTG: Pt Patient will consume least restrictive diet using compensatory strategies with assistance of (SLP): Minimal Assistance - Patient > 75%   Problem: RH Cognition - SLP Goal: RH LTG Patient will demonstrate orientation with cues Description:  LTG:  Patient will demonstrate orientation to person/place/time/situation with cues (SLP)   Flowsheets (Taken 12/28/2023 1345) LTG Patient will demonstrate orientation to:  Place  Situation  Time LTG: Patient will demonstrate orientation using cueing (SLP): Moderate Assistance - Patient 50 - 74%   Problem: RH Problem Solving Goal: LTG Patient will demonstrate problem solving for (SLP) Description: LTG:  Patient will demonstrate problem solving for basic/complex daily situations with cues  (SLP) Flowsheets (Taken 12/28/2023 1345) LTG: Patient will demonstrate problem solving for (SLP): Basic daily situations LTG Patient will demonstrate problem solving for: Moderate Assistance - Patient 50 - 74%   Problem: RH Memory Goal: LTG Patient will demonstrate ability for day to day (SLP) Description: LTG:   Patient will demonstrate ability for day to day recall/carryover during cognitive/linguistic activities with assist  (SLP) Flowsheets (Taken 12/28/2023 1345) LTG: Patient will demonstrate ability for day to day recall: Biographical information LTG: Patient will demonstrate ability for day to day recall/carryover during cognitive/linguistic activities with assist (SLP): Moderate Assistance - Patient 50 - 74%   Problem: RH Attention Goal: LTG Patient will demonstrate this level of attention during functional activites (SLP) Description: LTG:  Patient will will demonstrate this  level of attention during functional activites (SLP) Flowsheets (Taken 12/28/2023 1345) Patient will demonstrate during cognitive/linguistic activities the attention type of: Sustained LTG: Patient will demonstrate this level of attention during cognitive/linguistic activities with assistance of (SLP): Moderate Assistance - Patient 50 - 74% Number of minutes patient will demonstrate attention during cognitive/linguistic activities: 5

## 2023-12-28 NOTE — Evaluation (Signed)
 Occupational Therapy Assessment and Plan  Patient Details  Name: [name] MRN: [id number] Date of Birth: 1941-03-28  OT Diagnosis: apraxia, cognitive deficits, and muscle weakness (generalized) Rehab Potential: Rehab Potential (ACUTE ONLY): Good ELOS: 10-12 days   Today's Date: 12/28/2023 OT Individual Time: 1050-1200 OT Individual Time Calculation (min): 70 min     Hospital Problem: Principal Problem:   Ischemic cerebrovascular accident (CVA) (HCC)   Past Medical History:  Past Medical History:  Diagnosis Date   Cataract    COPD (chronic obstructive pulmonary disease) (HCC)    Glaucoma    Goiter    Hypertension 02/21/2018   Hypothyroid    Memory loss    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 Abel Hoe & Dr. Honey Lusty   Scoliosis    Severe aortic stenosis    Wears glasses    Past Surgical History:  Past Surgical History:  Procedure Laterality Date   CESAREAN SECTION  07/31/1976   TWINS, ONE WAS STILLBORN   INTRAOPERATIVE TRANSTHORACIC ECHOCARDIOGRAM N/A 11/20/2023   Procedure: ECHOCARDIOGRAM, TRANSTHORACIC;  Surgeon: Odie Benne, MD;  Location: [facility] INVASIVE CV LAB;  Service: Cardiovascular;  Laterality: N/A;   MOUTH SURGERY  07/31/1994   tongue resection   NODULE REMOVED     BENIGN, LOWER NECK   RIGHT HEART CATH AND CORONARY ANGIOGRAPHY N/A 09/21/2023   Procedure: RIGHT HEART CATH AND CORONARY ANGIOGRAPHY;  Surgeon: Odie Benne, MD;  Location: [facility] INVASIVE CV LAB;  Service: Cardiovascular;  Laterality: N/A;   TOTAL HIP ARTHROPLASTY Right 12/21/2023   Procedure: ARTHROPLASTY, HIP, TOTAL, ANTERIOR APPROACH;  Surgeon: Laneta Pintos, MD;  Location: [facility] OR;  Service: Orthopedics;  Laterality: Right;  HEMI HIP    Assessment & Plan Clinical Impression: [name] is an 83 year old  right-handed female with history of COPD with pulmonary nodule and quit smoking 29 years ago, osteoporosis, severe aortic stenosis status post TAVR 4/25, mild nonobstructive CAD followed by Dr. Antoinette Batman, oral cancer with tongue resection followed at [facility], diagnosed mild Alzheimer's and vascular dementia followed at [facility] neurology, hypertension, hyperlipidemia, hypothyroidism. Per chart review patient lives with daughter. 1 level home 3 steps to entry. Daughter works during the day. Independent mobility prior to admission with no assistive device, will furniture surf and likes to walk to the mailbox to get the mail. There has been reports of recent falls. Typically she was able to do some small cooking and cleaning. Daughter manages medicines and finances. Presented 12/18/2023 due to increasing confusion over the past 5 days with associated unsteady gait. Cranial CT scan negative for acute changes. 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 Underlying age-related cerebral atrophy with moderately advanced chronic microvascular ischemic disease. CTA with no emergent findings. Admission chemistries unremarkable except CO2 21, creatinine 1.05, GFR 53, ammonia levels unremarkable, urinalysis negative nitrite. Neurology follow-up. Patient did not receive TNK. Echocardiogram with ejection fraction of 65 to 70% grade 1 diastolic dysfunction no regional wall motion abnormalities. Neurology follow-up maintained on aspirin  81 mg daily with the addition of Plavix 75 mg daily x 3 weeks then Plavix alone. Recommendations of 30-day heart monitor after discharge an order has been placed. Hospital course complicated by unwitnessed fall while in the hospital 12/19/2023  with no loss of consciousness sustaining a right displaced femoral neck fracture. Patient was cleared for surgery by cardiology. Underwent right total hip  arthroplasty 12/21/2023 per Dr. Curtiss Dowdy and advised weightbearing as tolerated right lower extremity.. She did have postoperative anemia 9.4 and transfused 1 unit 5/26. Gastroenterology services consulted 12/25/2023 Dr. Dominic Friendly for reported rectal bleeding. 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 Her Plavix and aspirin  were initially held with hemoglobin stable at 11. Her aspirin  has been resumed 12/26/2023 and Plavix in 3 days beginning 12/29/2023. 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 Urine sodium 97 fluid restriction 1500 cc and maintained on sodium chloride  tablet. Therapy evaluations completed due to patient's decreased functional mobility was admitted for a comprehensive rehab program.  .  Patient transferred to CIR on 12/27/2023 .    Patient currently requires mod with basic self-care skills secondary to muscle weakness and muscle joint tightness, decreased cardiorespiratoy endurance, decreased coordination and decreased motor planning, decreased awareness, decreased problem solving, decreased safety awareness, decreased memory, and delayed processing, and decreased sitting balance, decreased standing balance, decreased postural control, and decreased balance strategies.  Prior to hospitalization, patient could complete BADLs with modified independent .  Patient will benefit from skilled intervention to increase independence with basic self-care skills prior to discharge home with care partner.  Anticipate patient will require 24 hour supervision and follow up home health.  OT - End of Session Activity Tolerance: Tolerates 10 - 20 min activity with multiple rests Endurance Deficit: Yes OT Assessment Rehab Potential (ACUTE ONLY): Good OT Patient demonstrates impairments in the following area(s): Balance;Cognition;Endurance;Motor OT Basic ADL's Functional Problem(s):  Bathing;Dressing;Toileting OT Transfers Functional Problem(s): Tub/Shower;Toilet OT Additional Impairment(s): None OT Plan OT Intensity: Minimum of 1-2 x/day, 45 to 90 minutes OT Frequency: 5 out of 7 days OT Duration/Estimated Length of Stay: 10-12 days OT Treatment/Interventions: Balance/vestibular training;Cognitive remediation/compensation;DME/adaptive equipment instruction;Discharge planning;Functional mobility training;Neuromuscular re-education;Psychosocial support;Patient/family education;Pain management;Self Care/advanced ADL retraining;UE/LE Strength taining/ROM;Therapeutic Exercise;Therapeutic Activities OT Self Feeding Anticipated Outcome(s): independent OT Basic Self-Care Anticipated Outcome(s): close supervision OT Toileting Anticipated Outcome(s): close supervision OT Bathroom Transfers Anticipated Outcome(s): CGA OT Recommendation Patient destination: Home Follow Up Recommendations: Home health OT Equipment Recommended: Tub/shower bench;To be determined   OT Evaluation Precautions/Restrictions  Precautions Precautions: Fall Restrictions RLE Weight Bearing Per Provider Order: Weight bearing as tolerated   Pain Pain Assessment Pain Score: 0-No pain Home Living/Prior Functioning Home Living Family/patient expects to be discharged to:: Private residence Living Arrangements: Children Available Help at Discharge: Family, Available PRN/intermittently Type of Home: House Home Access: Stairs to enter Secretary/administrator of Steps: 3 Entrance Stairs-Rails: None Home Layout: One level Bathroom Shower/Tub: Engineer, manufacturing systems: Standard Additional Comments: daughter works during the day,  Lives With: Daughter Prior Function Level of Independence: Independent with basic ADLs, Independent with gait, Requires assistive device for independence  Able to Take Stairs?: Yes Driving: No Vocation: Retired Administrator, sports Baseline Vision/History: 3 Glaucoma (R eye legally  blind) Ability to See in Adequate Light: 1 Impaired Patient Visual Report: No change from baseline Additional Comments: vision assessed in L eye -functional Perception  Perception: Within Functional Limits Praxis Praxis: Impaired Praxis Impairment Details: Initiation;Motor planning Cognition Cognition Overall Cognitive Status: Impaired/Different from baseline (unclear if cognition is at baseline) Arousal/Alertness: Awake/alert Orientation Level: Person Memory: Impaired Memory Impairment: Storage deficit;Decreased recall of new information Awareness: Impaired Awareness Impairment: Intellectual impairment Problem Solving: Impaired Problem Solving  Impairment: Verbal basic;Functional basic Safety/Judgment: Impaired Brief Interview for Mental Status (BIMS) Repetition of Three Words (First Attempt): 3 Temporal Orientation: Year: Correct Temporal Orientation: Month: No answer Temporal Orientation: Day: No answer Recall: "Sock": No, could not recall Recall: "Blue": No, could not recall Recall: "Bed": No, could not recall BIMS Summary Score: 6 Sensation Sensation Light Touch: Appears Intact Hot/Cold: Appears Intact Proprioception: Appears Intact Stereognosis: Not tested Coordination Gross Motor Movements are Fluid and Coordinated: No Fine Motor Movements are Fluid and Coordinated: Yes Coordination and Movement Description: functional UE coordination, RLE limited by new THA Motor  Motor Motor - Skilled Clinical Observations: limited by new THA  Trunk/Postural Assessment  Postural Control Postural Control: Deficits on evaluation Trunk Control: posterior lean in standing  Balance Dynamic Sitting Balance Dynamic Sitting - Level of Assistance: 4: Min assist Static Standing Balance Static Standing - Level of Assistance: 4: Min assist Dynamic Standing Balance Dynamic Standing - Level of Assistance: 3: Mod assist Extremity/Trunk Assessment RUE Assessment RUE Assessment: Within  Functional Limits LUE Assessment LUE Assessment: Within Functional Limits  Care Tool Care Tool Self Care Eating   Eating Assist Level: Set up assist    Oral Care    Oral Care Assist Level: Minimal Assistance - Patient > 75%    Bathing   Body parts bathed by patient: Right arm;Left arm;Chest;Abdomen;Right upper leg;Left upper leg;Face;Front perineal area Body parts bathed by helper: Buttocks;Left lower leg;Right lower leg   Assist Level: Moderate Assistance - Patient 50 - 74%    Upper Body Dressing(including orthotics)   What is the patient wearing?: Pull over shirt   Assist Level: Minimal Assistance - Patient > 75%    Lower Body Dressing (excluding footwear)   What is the patient wearing?: Underwear/pull up;Pants Assist for lower body dressing: Maximal Assistance - Patient 25 - 49%    Putting on/Taking off footwear   What is the patient wearing?: Non-skid slipper socks;Ted hose Assist for footwear: Dependent - Patient 0%       Care Tool Toileting Toileting activity   Assist for toileting: Moderate Assistance - Patient 50 - 74%     Care Tool Bed Mobility Roll left and right activity        Sit to lying activity        Lying to sitting on side of bed activity         Care Tool Transfers Sit to stand transfer   Sit to stand assist level: Moderate Assistance - Patient 50 - 74%    Chair/bed transfer   Chair/bed transfer assist level: Minimal Assistance - Patient > 75%     Toilet transfer   Assist Level: Minimal Assistance - Patient > 75%     Care Tool Cognition  Expression of Ideas and Wants Expression of Ideas and Wants: 3. Some difficulty - exhibits some difficulty with expressing needs and ideas (e.g, some words or finishing thoughts) or speech is not clear  Understanding Verbal and Non-Verbal Content Understanding Verbal and Non-Verbal Content: 2. Sometimes understands - understands only basic conversations or simple, direct phrases. Frequently requires  cues to understand   Memory/Recall Ability Memory/Recall Ability : None of the above were recalled   Refer to Care Plan for Long Term Goals  SHORT TERM GOAL WEEK 1 OT Short Term Goal 1 (Week 1): Pt will be able to don pants with min A using AE as needed OT Short Term Goal 2 (Week 1): Pt will be able to self cleanse post toileting with  min A. OT Short Term Goal 3 (Week 1): Pt will sit to stand with CGA at [facility].  Recommendations for other services: None    Skilled Therapeutic Intervention ADL ADL Eating: Set up Grooming: Minimal assistance Upper Body Bathing: Supervision/safety Lower Body Bathing: Moderate assistance Upper Body Dressing: Minimal assistance Lower Body Dressing: Maximal assistance Toileting: Moderate assistance Where Assessed-Toileting: Bedside Commode Toilet Transfer: Minimal assistance Toilet Transfer Method: Stand pivot Toilet Transfer Equipment: Bedside commode   Pt seen for initial evaluation and ADL training with a focus on gentle mobility with getting in and out of bed.  Transfers to [facility] , standing with RW.  Pt participated well and able to follow basic directions for self care tasks.   Reviewed role of OT, discussed POC and pt's goals, and ELOS.  Will need to meet with daughter as pt is not able to fully comprehend situation. Pt resting in bed With all needs met and bed alarm set.        Discharge Criteria: Patient will be discharged from OT if patient refuses treatment 3 consecutive times without medical reason, if treatment goals not met, if there is a change in medical status, if patient makes no progress towards goals or if patient is discharged from hospital.  The above assessment, treatment plan, treatment alternatives and goals were discussed and mutually agreed upon: by patient  Yesena Reaves 12/28/2023, 1:22 PM

## 2023-12-28 NOTE — Discharge Summary (Signed)
 Physician Discharge Summary  Patient ID: [name] MRN: [id number] DOB/AGE: 10/17/1940 83 y.o.  Admit date: 12/27/2023 Discharge date:01/11/2024  Discharge Diagnoses:  Principal Problem:   Ischemic cerebrovascular accident (CVA) (HCC) Memory loss Right femoral DVT Right displaced femoral neck fracture Acute blood loss anemia Rectal bleeding Hyponatremia Severe aortic stenosis History of oral cancer with tongue resection Glaucoma Hypertension Hyperlipidemia Hypothyroidism Constipation Osteoporosis COPD  Discharged Condition: Stable  Significant Diagnostic Studies: DG Swallowing Func-Speech Pathology Result Date: 01/03/2024 Table formatting from the original result was not included. Modified Barium Swallow Study Patient Details Name: ILIANA Grant MRN: [id number] Date of Birth: [birth year] Today's Date: 01/03/2024 HPI/PMH: HPI: Patient is an 83 y.o. female with PMH: Alzheimer's and vascular dementia, COPD, osteoporosis, severe aortic stenosis s/p TAVR. She was admitted to [facility] on 12/21/23 with intermittent confusion. At baseline, she lives with her daughter but is able to complete basic level ADL's and is home alone during the day while daughter is at work. MRI brain showed small punctate left frontal CVA. Patient with  fall while admitted, resulting in right femoral neck fracture s/p THA on 12/21/23. Clinical Impression: Patient presents with mild oropharyngeal dysphagia. Oral phase is characterized by reduced lingual control/coordination and bolus prep/mastication resulting in prolonged mastication of solids and instance of posterior spillage of oral residuals resulting in penetration to the vocal cords (spontaneously cleared - PAS 4). Patient benefits from liquid wash and double swallows to clear lingual residuals. Pharyngeal phase is remarkable for limited epiglottic inversion, incomplete laryngeal vestibular closure and tongue base retraction. These deficits resulted in consistent flash  penetration of thin liquids and pharyngeal residuals present on the BOT and vallecula. No other instances of penetration/aspiration present during remaining consistencies despite patient with consistent throat clears throughout study. During consumption of pill in puree, patient required multiple bites to clear from oral cavity. Recommend D3/thin liquids with medications whole vs crushed in puree. Patient should receive full supervision during mealtimes to cue for compensatory strategies due to patients cognitive deficits. DIGEST Swallow Severity Rating*  Safety: 1  Efficiency:1  Overall Pharyngeal Swallow Severity: mild 1: mild; 2: moderate; 3: severe; 4: profound *The Dynamic Imaging Grade of Swallowing Toxicity is standardized for the head and neck cancer population, however, demonstrates promising clinical applications across populations to standardize the clinical rating of pharyngeal swallow safety and severity. Factors that may increase risk of adverse event in presence of aspiration Roderick Civatte & Jessy Morocco 2021): Factors that may increase risk of adverse event in presence of aspiration Roderick Civatte & Jessy Morocco 2021): Frail or deconditioned; Reduced cognitive function Recommendations/Plan: Swallowing Evaluation Recommendations Swallowing Evaluation Recommendations Recommendations: PO diet PO Diet Recommendation: Dysphagia 3 (Mechanical soft); Thin liquids (Level 0) Liquid Administration via: Straw; Cup Medication Administration: Crushed with puree Supervision: Full supervision/cueing for swallowing strategies; Staff to assist with self-feeding Swallowing strategies  : Minimize environmental distractions; Slow rate; Small bites/sips Postural changes: Position pt fully upright for meals Oral care recommendations: Oral care BID (2x/day) Treatment Plan Treatment Plan Treatment recommendations: Therapy as outlined in treatment plan below Follow-up recommendations: Home health SLP Functional status assessment: Patient has had a  recent decline in their functional status and demonstrates the ability to make significant improvements in function in a reasonable and predictable amount of time. Treatment frequency: Min 2x/week Treatment duration: 2 weeks Interventions: Aspiration precaution training; Oropharyngeal exercises; Patient/family education; Diet toleration management by SLP; Compensatory techniques Recommendations Recommendations for follow up therapy are one component of a multi-disciplinary discharge planning process, led by the attending  physician.  Recommendations may be updated based on patient status, additional functional criteria and insurance authorization. Assessment: Orofacial Exam: Orofacial Exam Oral Cavity: Oral Hygiene: WFL Oral Cavity - Dentition: Adequate natural dentition Orofacial Anatomy: WFL Anatomy: Anatomy: Prominent cricopharyngeus Boluses Administered: Boluses Administered Boluses Administered: Thin liquids (Level 0); Puree; Solid  Oral Impairment Domain: Oral Impairment Domain Lip Closure: No labial escape Tongue control during bolus hold: Cohesive bolus between tongue to palatal seal Bolus preparation/mastication: Slow prolonged chewing/mashing with complete recollection Bolus transport/lingual motion: Slow tongue motion Oral residue: Residue collection on oral structures Location of oral residue : Tongue; Palate Initiation of pharyngeal swallow : -- (x1 initiation at vocal cords due to posterior spillage of oral residuals)  Pharyngeal Impairment Domain: Pharyngeal Impairment Domain Soft palate elevation: No bolus between soft palate (SP)/pharyngeal wall (PW) Laryngeal elevation: Partial superior movement of thyroid  cartilage/partial approximation of arytenoids to epiglottic petiole Anterior hyoid excursion: Partial anterior movement Epiglottic movement: -- (limited inversion) Laryngeal vestibule closure: Incomplete, narrow column air/contrast in laryngeal vestibule Pharyngeal stripping wave : Present -  diminished Pharyngeal contraction (A/P view only): N/A Pharyngoesophageal segment opening: Partial distention/partial duration, partial obstruction of flow (CP Bar) Tongue base retraction: Trace column of contrast or air between tongue base and PPW Pharyngeal residue: Collection of residue within or on pharyngeal structures Location of pharyngeal residue: Valleculae; Tongue base  Esophageal Impairment Domain: No data recorded Pill: Pill Consistency administered: Puree Puree: WFL (required multiple bites of puree) Penetration/Aspiration Scale Score: Penetration/Aspiration Scale Score 1.  Material does not enter airway: Puree; Solid; Pill 4.  Material enters airway, CONTACTS cords then ejected out: Thin liquids (Level 0) Compensatory Strategies: Compensatory Strategies Compensatory strategies: Yes Multiple swallows: Effective Effective Multiple Swallows: Puree; Solid; Pill Liquid wash: Effective Effective Liquid Wash: Puree; Solid   General Information: No data recorded Diet Prior to this Study: Dysphagia 3 (mechanical soft); Thin liquids (Level 0)   No data recorded  Respiratory Status: WFL   Supplemental O2: None (Room air)   No data recorded Behavior/Cognition: Alert; Cooperative Self-Feeding Abilities: Needs assist with self-feeding No data recorded Volitional Cough: Able to elicit Volitional Swallow: Able to elicit Exam Limitations: No limitations Goal Planning: Prognosis for improved oropharyngeal function: Guarded Barriers to Reach Goals: Cognitive deficits No data recorded No data recorded Consulted and agree with results and recommendations: Patient; Nurse Pain: No data recorded End of Session: Start Time:No data recorded Stop Time: No data recorded Time Calculation:No data recorded Charges: No data recorded SLP visit diagnosis: SLP Visit Diagnosis: Dysphagia, oropharyngeal phase (R13.12) Past Medical History: Past Medical History: Diagnosis Date  Cataract   COPD (chronic obstructive pulmonary disease) (HCC)    Glaucoma   Goiter   Hypertension 02/21/2018  Hypothyroid   Memory loss   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 Abel Hoe & Dr. Honey Lusty  Scoliosis   Severe aortic stenosis   Wears glasses  Past Surgical History: Past Surgical History: Procedure Laterality Date  CESAREAN SECTION  07/31/1976  TWINS, ONE WAS STILLBORN  INTRAOPERATIVE TRANSTHORACIC ECHOCARDIOGRAM N/A 11/20/2023  Procedure: ECHOCARDIOGRAM, TRANSTHORACIC;  Surgeon: Odie Benne, MD;  Location: [facility] INVASIVE CV [facility];  Service: Cardiovascular;  Laterality: N/A;  IR IVC FILTER PLMT / S&I /IMG GUID/MOD SED  12/31/2023  MOUTH SURGERY  07/31/1994  tongue resection  NODULE REMOVED    BENIGN,  LOWER NECK  RIGHT HEART CATH AND CORONARY ANGIOGRAPHY N/A 09/21/2023  Procedure: RIGHT HEART CATH AND CORONARY ANGIOGRAPHY;  Surgeon: Odie Benne, MD;  Location: [facility] INVASIVE CV LAB;  Service: Cardiovascular;  Laterality: N/A;  TOTAL HIP ARTHROPLASTY Right 12/21/2023  Procedure: ARTHROPLASTY, HIP, TOTAL, ANTERIOR APPROACH;  Surgeon: Laneta Pintos, MD;  Location: [facility] OR;  Service: Orthopedics;  Laterality: Right;  HEMI HIP Cassidi Sockwell M.A., CCC-SLP 01/03/2024, 9:37 AM  IR IVC FILTER PLMT / S&I Dan Dun GUID/MOD SED Result Date: 12/31/2023 CLINICAL DATA:  Bilateral lower extremity DVT. GI bleed, a relative contraindication to anticoagulation. Caval filtration requested. EXAM: INFERIOR VENACAVOGRAM IVC FILTER PLACEMENT UNDER FLUOROSCOPY FLUOROSCOPY: Radiation Exposure Index (as provided by the fluoroscopic device): 22.1 mGy air Kerma TECHNIQUE: Patency of the right IJ vein was confirmed with ultrasound with image documentation. An appropriate skin site was determined. Skin site was marked, prepped with chlorhexidine , and draped using maximum barrier technique. The region was infiltrated locally with 1%  lidocaine . Under real-time ultrasound guidance, the right IJ vein was accessed with a 21 gauge micropuncture needle; the needle tip within the vein was confirmed with ultrasound image documentation. The needle was exchanged over a 018 guidewire for a transitional dilator, which allow advancement of the [facility] wire into the IVC. A long 6 French vascular sheath was placed for inferior venacavography. This demonstrated no caval thrombus. Renal vein inflows were evident. [facility] retrievable IVC filter was advanced through the sheath and successfully deployed under fluoroscopy at the L2-3 level. Followup cavagram demonstrates stable infrarenal filter position and no evident complication. The sheath was removed and hemostasis achieved at the site. No immediate complication. IMPRESSION: 1. Normal IVC. No thrombus or significant anatomic variation. 2. Technically successful infrarenal IVC filter placement. This is a retrievable model. PLAN: This IVC filter is potentially retrievable. The patient will be assessed for filter retrieval by Interventional Radiology in approximately 8-12 weeks. Further recommendations regarding filter retrieval, continued surveillance or declaration of device permanence, will be made at that time. Electronically Signed   By: Nicoletta Barrier M.D.   On: 12/31/2023 10:19   VAS US  LOWER EXTREMITY VENOUS (DVT) Result Date: 12/30/2023  Lower Venous DVT Study Patient Name:  ZOEJANE GAULIN  Date of Exam:   12/29/2023 Medical Rec #: [id number]      Accession #:    [id number] Date of Birth: [birth year]      Patient Gender: F Patient Age:   46 years Exam Location:  [facility] Procedure:      VAS US  LOWER EXTREMITY VENOUS (DVT) Referring Phys: Georjean Kite --------------------------------------------------------------------------------  Indications: Status post unwitnessed fall in hospital after CVA resulting in right displaced femoral neck fracture and right total hip arthroplasty. Now in rehab unit.   Risk Factors: Immobility. Limitations: Pain with compression and rotation of legs and bandages at proximal thigh and groin. Comparison Study: No prior study on file Performing Technologist: Carleene Chase RVS  Examination Guidelines: A complete evaluation includes B-mode imaging, spectral Doppler, color Doppler, and power Doppler as needed of all accessible portions of each vessel. Bilateral testing is considered an integral part of a complete examination. Limited examinations for reoccurring indications may be performed as noted. The reflux portion of the exam is performed with the patient in reverse Trendelenburg.  +---------+---------------+---------+-----------+----------+-------------------+ RIGHT    CompressibilityPhasicitySpontaneityPropertiesThrombus Aging      +---------+---------------+---------+-----------+----------+-------------------+ CFV                     Yes  Yes                  patent by color and                                                       Doppler             +---------+---------------+---------+-----------+----------+-------------------+ SFJ                     Yes      Yes                  patent by color and                                                       Doppler             +---------+---------------+---------+-----------+----------+-------------------+ FV Prox                 Yes      Yes                  patent by color and                                                       Doppler             +---------+---------------+---------+-----------+----------+-------------------+ FV Mid   None           No       No                   Age Indeterminate   +---------+---------------+---------+-----------+----------+-------------------+ FV DistalNone           No       No                   Age Indeterminate   +---------+---------------+---------+-----------+----------+-------------------+ PFV                      Yes      Yes                  patent by color and                                                       Doppler             +---------+---------------+---------+-----------+----------+-------------------+ POP      Full           Yes      Yes                                      +---------+---------------+---------+-----------+----------+-------------------+ PTV      Full                                                             +---------+---------------+---------+-----------+----------+-------------------+  PERO     Full                                                             +---------+---------------+---------+-----------+----------+-------------------+   +---------+---------------+---------+-----------+----------+-------------------+ LEFT     CompressibilityPhasicitySpontaneityPropertiesThrombus Aging      +---------+---------------+---------+-----------+----------+-------------------+ CFV      Full           Yes      Yes                                      +---------+---------------+---------+-----------+----------+-------------------+ SFJ      Full                                                             +---------+---------------+---------+-----------+----------+-------------------+ FV Prox  Full           Yes      Yes                                      +---------+---------------+---------+-----------+----------+-------------------+ FV Mid   Full                                                             +---------+---------------+---------+-----------+----------+-------------------+ FV DistalFull           Yes      Yes                                      +---------+---------------+---------+-----------+----------+-------------------+ PFV      Full                                                             +---------+---------------+---------+-----------+----------+-------------------+ POP                      Yes      Yes                  patent by color and                                                       Doppler             +---------+---------------+---------+-----------+----------+-------------------+ PTV      Full                                                             +---------+---------------+---------+-----------+----------+-------------------+  PERO     Full                                                             +---------+---------------+---------+-----------+----------+-------------------+     Summary: RIGHT: - Findings consistent with age indeterminate deep vein thrombosis involving the Mid to distal femoral vein.   LEFT: - There is no evidence of deep vein thrombosis in the lower extremity.  *See table(s) above for measurements and observations. Electronically signed by Jimmye Moulds MD on 12/30/2023 at 11:35:52 AM.    Final    CT ANGIO GI BLEED Result Date: 12/25/2023 CLINICAL DATA:  Right red blood per rectum EXAM: CTA ABDOMEN AND PELVIS WITHOUT AND WITH CONTRAST TECHNIQUE: Multidetector CT imaging of the abdomen and pelvis was performed using the standard protocol during bolus administration of intravenous contrast. Multiplanar reconstructed images and MIPs were obtained and reviewed to evaluate the vascular anatomy. RADIATION DOSE REDUCTION: This exam was performed according to the departmental dose-optimization program which includes automated exposure control, adjustment of the mA and/or kV according to patient size and/or use of iterative reconstruction technique. CONTRAST:  OMNIPAQUE  IOHEXOL  350 MG/ML SOLN COMPARISON:  None Available. FINDINGS: VASCULAR Aorta: Normal caliber aorta without aneurysm, dissection, vasculitis or significant stenosis. Celiac: Patent without evidence of aneurysm, dissection, vasculitis or significant stenosis. SMA: Patent without evidence of aneurysm, dissection, vasculitis or significant stenosis. Renals: Both renal  arteries are patent without evidence of aneurysm, dissection, vasculitis, fibromuscular dysplasia or significant stenosis. IMA: Patent without evidence of aneurysm, dissection, vasculitis or significant stenosis. Inflow: Patent without evidence of aneurysm, dissection, vasculitis or significant stenosis. Proximal Outflow: Bilateral common femoral and visualized portions of the superficial and profunda femoral arteries are patent without evidence of aneurysm, dissection, vasculitis or significant stenosis. Veins: Negative Review of the MIP images confirms the above findings. NON-VASCULAR Lower chest: Small amount of right pleural fluid Hepatobiliary: No focal liver abnormality is seen. No gallstones, gallbladder wall thickening, or biliary dilatation. Pancreas: Unremarkable. No pancreatic ductal dilatation or surrounding inflammatory changes. Spleen: Normal in size without focal abnormality. Adrenals/Urinary Tract: Adrenal glands are unremarkable. Kidneys are normal, without renal calculi, focal lesion, or hydronephrosis. Bladder is unremarkable. Stomach/Bowel: Large stool ball in the rectum. Mild presacral and perirectal stranding. Rectosigmoid diverticulosis. No visible hemorrhage into the colon. No dilated small bowel. Lymphatic: No lymphadenopathy Reproductive: No adnexal mass or free fluid in the pelvis. Other: Gas and edema within the soft tissues anterior to the right hip, likely postsurgical. Musculoskeletal: Recent right hip arthroplasty. No acute osseous findings. Bilateral L5 pars interarticularis defects IMPRESSION: 1. No visible hemorrhage into the colon. 2. Large stool ball in the rectum with mild presacral and perirectal stranding, which may indicate stercoral colitis. 3. Recent right hip arthroplasty with gas and edema within the soft tissues anterior to the right hip, likely postsurgical. 4. Small amount of right pleural fluid. Electronically Signed   By: Juanetta Nordmann M.D.   On: 12/25/2023 02:48    DG HIP UNILAT W OR W/O PELVIS 2-3 VIEWS RIGHT Result Date: 12/21/2023 CLINICAL DATA:  Postop EXAM: DG HIP (WITH OR WITHOUT PELVIS) 2-3V RIGHT COMPARISON:  12/20/2023 FINDINGS: Interval right hip replacement with normal alignment. Gas in the soft tissues consistent with recent surgery. IMPRESSION: Status post right hip replacement with expected postsurgical change.  Electronically Signed   By: Esmeralda Hedge M.D.   On: 12/21/2023 18:09   DG HIP UNILAT WITH PELVIS 1V RIGHT Result Date: 12/21/2023 CLINICAL DATA:  Elective surgery EXAM: DG HIP (WITH OR WITHOUT PELVIS) 1V RIGHT COMPARISON:  12/20/2023 FINDINGS: Multiple low resolution intraoperative spot views of the right hip. Total fluoroscopy time was 43.3 seconds, fluoroscopic dose of 4.6 mGy. Images were obtained during a right hip replacement. IMPRESSION: Intraoperative fluoroscopic assistance provided during right hip replacement. Electronically Signed   By: Esmeralda Hedge M.D.   On: 12/21/2023 18:09   DG C-Arm 1-60 Min-No Report Result Date: 12/21/2023 Fluoroscopy was utilized by the requesting physician.  No radiographic interpretation.   DG C-Arm 1-60 Min-No Report Result Date: 12/21/2023 Fluoroscopy was utilized by the requesting physician.  No radiographic interpretation.   CT HEAD WO CONTRAST ( ) Result Date: 12/20/2023 EXAM: CT HEAD WITHOUT 12/20/2023 11:59:13 AM TECHNIQUE: CT of the head was performed without the administration of intravenous contrast. Automated exposure control, iterative reconstruction, and/or weight based adjustment of the mA/kV was utilized to reduce the radiation dose to as low as reasonably achievable. COMPARISON: CT angiography of the head and neck and noncontrast head CT on 12/19/2023, and MR head without contrast on 12/18/2023. CLINICAL HISTORY: Head trauma, minor (Age >= 65y). Chief complaints; Altered Mental Status. FINDINGS: BRAIN AND VENTRICLES: There is no acute intracranial hemorrhage, mass effect or midline  shift. No abnormal extra-axial fluid collection. The gray-white differentiation is maintained without evidence of an acute infarct. There is no evidence of hydrocephalus. Confluent periventricular and subcortical white matter disease is stable . ORBITS: The visualized portion of the orbits demonstrate no acute abnormality. SINUSES: The visualized paranasal sinuses and mastoid air cells demonstrate no acute abnormality. SOFT TISSUES AND SKULL: No acute abnormality of the visualized skull or soft tissues. VASCULATURE: Atherosclerotic calcifications are present in the cavernous carotid arteries bilaterally. No hyperdense vessel is present. IMPRESSION: 1. No acute intracranial abnormality related to the minor head trauma and altered mental status. 2. Stable confluent periventricular and subcortical white matter disease. Electronically signed by: Audree Leas MD 12/20/2023 04:35 PM EDT RP Workstation: [id number]   DG FEMUR PORT, MIN 2 VIEWS RIGHT Result Date: 12/20/2023 CLINICAL DATA:  Fall.  Right hip pain. EXAM: RIGHT FEMUR PORTABLE 2 VIEW; DG HIP (WITH OR WITHOUT PELVIS) 2-3V RIGHT COMPARISON:  Pelvis and right hip radiographs 12/19/2023 FINDINGS: There is diffuse decreased bone mineralization. There is a new acute fracture of the mid right femoral neck with mild superior displacement of the distal fracture component and mild varus angulation. The bilateral femoral heads remain normally located. Mild bilateral superolateral acetabular degenerative osteophytosis. Mild bilateral sacroiliac subchondral sclerosis. Normal alignment at the knee. Mild medial compartment of the knee joint space narrowing. IMPRESSION: Acute mildly displaced and angulated fracture of the mid right femoral neck. This was not present on yesterday's radiographs. Electronically Signed   By: Bertina Broccoli M.D.   On: 12/20/2023 12:12   DG HIP UNILAT WITH PELVIS 2-3 VIEWS RIGHT Result Date: 12/20/2023 CLINICAL DATA:  Fall.  Right hip  pain. EXAM: RIGHT FEMUR PORTABLE 2 VIEW; DG HIP (WITH OR WITHOUT PELVIS) 2-3V RIGHT COMPARISON:  Pelvis and right hip radiographs 12/19/2023 FINDINGS: There is diffuse decreased bone mineralization. There is a new acute fracture of the mid right femoral neck with mild superior displacement of the distal fracture component and mild varus angulation. The bilateral femoral heads remain normally located. Mild bilateral superolateral acetabular degenerative osteophytosis. Mild bilateral sacroiliac subchondral  sclerosis. Normal alignment at the knee. Mild medial compartment of the knee joint space narrowing. IMPRESSION: Acute mildly displaced and angulated fracture of the mid right femoral neck. This was not present on yesterday's radiographs. Electronically Signed   By: Bertina Broccoli M.D.   On: 12/20/2023 12:12   DG HIP PORT UNILAT WITH PELVIS 1V RIGHT Result Date: 12/19/2023 CLINICAL DATA:  Right hip pain. EXAM: DG HIP (WITH OR WITHOUT PELVIS) 1V PORT RIGHT COMPARISON:  None Available. FINDINGS: Mildly decreased bone mineralization. Mild bilateral sacroiliac subchondral sclerosis. The bilateral femoroacetabular joint spaces are maintained. Mild superior pubic symphysis degenerative spurring. Moderate severe left L4-5 and moderate left L3-4 disc space narrowing. No acute fracture or dislocation. A vascular phlebolith overlies the left hemipelvis. IMPRESSION: 1. Mild bilateral sacroiliac osteoarthritis. 2. Moderate to severe left L4-5 and moderate left L3-4 degenerative disc and endplate changes. Electronically Signed   By: Bertina Broccoli M.D.   On: 12/19/2023 21:02   ECHOCARDIOGRAM COMPLETE Result Date: 12/19/2023    ECHOCARDIOGRAM REPORT   Patient Name:   AMEENA VESEY Date of Exam: 12/19/2023 Medical Rec #:  [id number]     Height:       66.0 in Accession #:    [id number]    Weight:       132.5 lb Date of Birth:  [birth year]     BSA:          1.679 m Patient Age:    82 years      BP:           171/67 mmHg Patient  Gender: F             HR:           68 bpm. Exam Location:  Inpatient Procedure: 2D Echo, Cardiac Doppler and Color Doppler (Both Spectral and Color            Flow Doppler were utilized during procedure). Indications:    Stroke  History:        Patient has prior history of Echocardiogram examinations, most                 recent 11/21/2023.  Sonographer:    Janette Medley Referring Phys: CAROLE N HALL IMPRESSIONS  1. Left ventricular ejection fraction, by estimation, is 65 to 70%. The left ventricle has normal function. The left ventricle has no regional wall motion abnormalities. Left ventricular diastolic parameters are consistent with Grade I diastolic dysfunction (impaired relaxation). Elevated left ventricular end-diastolic pressure.  2. Right ventricular systolic function is normal. The right ventricular size is normal.  3. The mitral valve is normal in structure. No evidence of mitral valve regurgitation. No evidence of mitral stenosis.  4. The aortic valve is normal in structure. Aortic valve regurgitation is mild. No aortic stenosis is present. Echo findings are consistent with normal structure and function of the aortic valve prosthesis. Aortic valve area, by VTI measures 2.29 cm. Aortic valve mean gradient measures 5.0 mmHg. Aortic valve Vmax measures 1.48 m/s.  5. The inferior vena cava is normal in size with greater than 50% respiratory variability, suggesting right atrial pressure of 3 mmHg. FINDINGS  Left Ventricle: Left ventricular ejection fraction, by estimation, is 65 to 70%. The left ventricle has normal function. The left ventricle has no regional wall motion abnormalities. The left ventricular internal cavity size was normal in size. There is  no left ventricular hypertrophy. Left ventricular diastolic parameters are consistent with Grade I diastolic dysfunction (impaired relaxation).  Elevated left ventricular end-diastolic pressure. Right Ventricle: The right ventricular size is normal. No  increase in right ventricular wall thickness. Right ventricular systolic function is normal. Left Atrium: Left atrial size was normal in size. Right Atrium: Right atrial size was normal in size. Pericardium: There is no evidence of pericardial effusion. Mitral Valve: The mitral valve is normal in structure. No evidence of mitral valve regurgitation. No evidence of mitral valve stenosis. MV peak gradient, 7.5 mmHg. The mean mitral valve gradient is 5.0 mmHg. Tricuspid Valve: The tricuspid valve is normal in structure. Tricuspid valve regurgitation is not demonstrated. No evidence of tricuspid stenosis. Aortic Valve: The aortic valve is normal in structure. Aortic valve regurgitation is mild. No aortic stenosis is present. Aortic valve mean gradient measures 5.0 mmHg. Aortic valve peak gradient measures 8.8 mmHg. Aortic valve area, by VTI measures 2.29 cm. 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 Echo findings are consistent with normal structure and function of the aortic valve prosthesis. Pulmonic Valve: The pulmonic valve was normal in structure. Pulmonic valve regurgitation is mild. No evidence of pulmonic stenosis. Aorta: The aortic root is normal in size and structure. Venous: The inferior vena cava is normal in size with greater than 50% respiratory variability, suggesting right atrial pressure of 3 mmHg. IAS/Shunts: No atrial level shunt detected by color flow Doppler.  LEFT VENTRICLE PLAX 2D LVIDd:         3.40 cm   Diastology LVIDs:         2.10 cm   LV e' medial:    7.83 cm/s LV PW:         1.00 cm   LV E/e' medial:  14.3 LV IVS:        1.10 cm   LV e' lateral:   8.16 cm/s LVOT diam:     1.70 cm   LV E/e' lateral: 13.7 LV SV:         83 LV SV Index:   49 LVOT Area:     2.27 cm  RIGHT VENTRICLE             IVC RV S prime:     13.70 cm/s  IVC diam: 1.30 cm TAPSE (M-mode): 2.4 cm LEFT ATRIUM             Index        RIGHT ATRIUM           Index LA diam:         3.60 cm 2.14 cm/m   RA Area:     11.40 cm LA Vol (A2C):   22.4 ml 13.34 ml/m  RA Volume:   24.80 ml  14.77 ml/m LA Vol (A4C):   25.9 ml 15.43 ml/m LA Biplane Vol: 23.8 ml 14.18 ml/m  AORTIC VALVE AV Area (Vmax):    2.24 cm AV Area (Vmean):   2.34 cm AV Area (VTI):     2.29 cm AV Vmax:           148.50 cm/s AV Vmean:          98.900 cm/s AV VTI:            0.362 m AV Peak Grad:      8.8 mmHg AV Mean Grad:      5.0 mmHg LVOT Vmax:         146.50 cm/s LVOT Vmean:        102.000 cm/s LVOT VTI:  0.365 m LVOT/AV VTI ratio: 1.01  AORTA Ao Asc diam: 2.70 cm MITRAL VALVE MV Area (PHT): 3.03 cm     SHUNTS MV Area VTI:   1.85 cm     Systemic VTI:  0.36 m MV Peak grad:  7.5 mmHg     Systemic Diam: 1.70 cm MV Mean grad:  5.0 mmHg MV Vmax:       1.37 m/s MV Vmean:      106.0 cm/s MV Decel Time: 250 msec MV E velocity: 112.00 cm/s MV A velocity: 124.00 cm/s MV E/A ratio:  0.90 Maudine Sos MD Electronically signed by Maudine Sos MD Signature Date/Time: 12/19/2023/12:25:33 PM    Final    CT ANGIO HEAD NECK W WO CM Result Date: 12/19/2023 CLINICAL DATA:  Stroke, determine embolic source EXAM: CT ANGIOGRAPHY HEAD AND NECK WITH AND WITHOUT CONTRAST TECHNIQUE: Multidetector CT imaging of the head and neck was performed using the standard protocol during bolus administration of intravenous contrast. Multiplanar CT image reconstructions and MIPs were obtained to evaluate the vascular anatomy. Carotid stenosis measurements (when applicable) are obtained utilizing NASCET criteria, using the distal internal carotid diameter as the denominator. RADIATION DOSE REDUCTION: This exam was performed according to the departmental dose-optimization program which includes automated exposure control, adjustment of the mA and/or kV according to patient size and/or use of iterative reconstruction technique. CONTRAST:  75mL OMNIPAQUE  IOHEXOL  350 MG/ML SOLN COMPARISON:  Brain MRI from yesterday FINDINGS: CT HEAD FINDINGS  Brain: The patient's acute infarct is occult by noncontrast CT. Cerebral volume loss and extensive chronic small vessel ischemia. No intracranial hemorrhage Vascular: No hyperdense vessel or unexpected calcification. 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 Review of the MIP images confirms the above findings CTA NECK FINDINGS Aortic arch: Extensive atheromatous plaque. 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 Right carotid system: No flow reducing stenosis or ulceration seen. Suboptimal opacification due to preferential venous timing. Left carotid system: Scattered atheromatous plaque, greatest calcification at the bifurcation. No stenosis or ulceration is seen. Vertebral arteries: No proximal subclavian stenosis. Left dominant vertebral artery. No evidence of flow reducing stenosis, beading, or dissection in the vertebral arteries - limited by contrast timing. Skeleton: Generalized cervical spine degeneration. Other neck: No acute finding.  Absent right submandibular gland Upper chest: Pleural based scarring with calcification at the apices. Review of the MIP images confirms the above findings CTA HEAD FINDINGS Anterior circulation: Extensive atheromatous calcification along the carotid siphons. No branch occlusion, beading, or aneurysm. Limited in evaluating peripheral branches due to bolus quality. Posterior circulation: The vertebral and basilar arteries are sufficiently patent. Fetal type left PCA. No branch occlusion or evidence of proximal flow reducing stenosis. No evidence of aneurysm Venous sinuses: Diffusely patent Review of the MIP images confirms the above findings IMPRESSION: Suboptimal CTA due to bolus timing. No emergent finding. Atherosclerosis without flow reducing stenosis or irregularity involving major arteries in the head and neck. Electronically Signed   By: Ronnette Coke M.D.   On: 12/19/2023 08:36   MR BRAIN WO  CONTRAST Result Date: 12/18/2023 CLINICAL DATA:  Initial evaluation for acute neuro deficit, stroke suspected. EXAM: MRI HEAD WITHOUT CONTRAST TECHNIQUE: Multiplanar, multiecho pulse sequences of the brain and surrounding structures were obtained without intravenous contrast. COMPARISON:  CT from earlier the same day. FINDINGS: Brain: Examination degraded by motion. Generalized age-related cerebral atrophy. Patchy T2/FLAIR hyperintensity involving the supratentorial cerebral white matter and pons, consistent with chronic small vessel ischemic disease, moderately  advanced in nature. Punctate focus of restricted diffusion seen involving the high left frontal lobe, consistent with a tiny acute ischemic nonhemorrhagic infarct (series 2, image 42). No other evidence for acute or subacute ischemia. No areas of chronic cortical infarction. No acute or significant chronic intracranial blood products. No mass lesion, midline shift or mass effect. No hydrocephalus or extra-axial fluid collection. Pituitary gland within normal limits. Vascular: Major intracranial vascular flow voids are maintained. Skull and upper cervical spine: Craniocervical junction within normal limits. Bone marrow signal intensity normal. No scalp soft tissue abnormality. Sinuses/Orbits: Prior bowel ocular lens replacement. Paranasal sinuses are largely clear. No significant mastoid effusion. Other: None. IMPRESSION: 1. 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 2. Underlying age-related cerebral atrophy with moderately advanced chronic microvascular ischemic disease. Electronically Signed   By: Virgia Griffins M.D.   On: 12/18/2023 20:33   CT HEAD WO CONTRAST Result Date: 12/18/2023 CLINICAL DATA:  Mental status change, unknown cause. EXAM: CT HEAD WITHOUT CONTRAST TECHNIQUE: Contiguous axial images were obtained from the base of the skull through the vertex without intravenous contrast. RADIATION DOSE  REDUCTION: This exam was performed according to the departmental dose-optimization program which includes automated exposure control, adjustment of the mA and/or kV according to patient size and/or use of iterative reconstruction technique. COMPARISON:  MRI head from 01/15/2022. FINDINGS: Brain: No evidence of acute infarction, hemorrhage, hydrocephalus, extra-axial collection or mass lesion/mass effect. There is bilateral periventricular hypodensity, which is non-specific but most likely seen in the settings of microvascular ischemic changes. Moderate in extent. otherwise normal appearance of brain parenchyma. Ventricles are prominent but cerebral volume is age appropriate. Vascular: No hyperdense vessel or unexpected calcification. Intracranial arteriosclerosis. Skull: Normal. Negative for fracture or focal lesion. Sinuses/Orbits: No acute finding. Hyperdense implant noted in the right orbit. Other: Visualized mastoid air cells are unremarkable. No mastoid effusion. IMPRESSION: *No acute intracranial abnormality. Electronically Signed   By: Beula Brunswick M.D.   On: 12/18/2023 16:10   DG Chest Port 1 View Result Date: 12/18/2023 CLINICAL DATA:  Weakness. EXAM: PORTABLE CHEST 1 VIEW COMPARISON:  11/16/2023. FINDINGS: Biapical pleural thickening noted. Bilateral lung fields are otherwise clear. No acute consolidation or lung collapse. Bilateral costophrenic angles are clear. Stable cardio-mediastinal silhouette. 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 No acute osseous abnormalities. The soft tissues are within normal limits. IMPRESSION: No active disease. Electronically Signed   By: Beula Brunswick M.D.   On: 12/18/2023 16:08    Labs:  Basic Metabolic Panel: Recent Labs  Lab 01/07/24 0513 01/10/24 0616  NA 137 140  K 3.8 4.0  CL 104 107  CO2 24 25  GLUCOSE 101* 99  BUN 21 17  CREATININE 0.89 0.90  CALCIUM  9.0 9.2    CBC: Recent Labs  Lab 01/07/24 0513 01/10/24 0616  WBC 7.6 8.7  HGB 9.9* 10.6*  HCT  30.4* 33.3*  MCV 92.1 93.3  PLT 446* 422*    CBG: No results for input(s): GLUCAP in the last 168 hours.  Family history.  Mother with thyroid  disease and dementia.  Denies any colon cancer or esophageal cancer or rectal cancer  Brief HPI:   JENEAN ESCANDON is a 83 y.o. right-handed female with history significant for COPD with pulmonary nodule quit smoking 29 years ago osteoporosis severe aortic stenosis status post TAVR 4/25, mild nonobstructive CAD followed by cardiology services, oral cancer tongue resection followed by [facility], diagnosed mild Alzheimer's disease and vascular dementia followed  by [facility] neurology.  Hypertension hyperlipidemia and hypothyroidism.  Per chart review patient lives with daughter.  1 level home 3 steps to enter.  Daughter works during the day.  Independent mobility prior to admission with no assistive device will furniture surf and likes to walk to the mailbox to get the mail.  There has been reports of recent falls.  Typically she was able to do some cooking and cleaning.  Daughter manages medications and finances.  Presented 12/18/2023 due to increasing confusion over the past 5 days with associated unsteady gait.  Cranial CT scan negative for acute changes.  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  Underlying age-related cerebral atrophy with moderately advanced chronic microvascular ischemic disease.  CTA with no emergent findings.  Admission chemistries unremarkable except CO2 21 creatinine 1.05 GFR 53 ammonia levels unremarkable urinalysis negative.  Neurology follow-up.  Patient did not receive TNK.  Echocardiogram with ejection fraction of 65 to 70% grade 1 diastolic dysfunction no regional wall motion abnormalities.  Neurology follow-up maintained on aspirin  81 mg daily and Plavix  75 mg daily x 3 weeks then Plavix  alone.  Recommendations of 30-day heart monitor after  discharge and order has been placed.  Hospital course complicated by unwitnessed fall while in the hospital 12/19/2023 with no loss of consciousness sustaining a right displaced femoral neck fracture.  Patient was cleared for surgery by cardiology services.  Underwent right total hip arthroplasty 12/21/2023 per Dr. Curtiss Dowdy advised weightbearing as tolerated.  She did have some postoperative anemia 9.4 transfuse 1 unit 5/26.  Gastroenterology services consulted 12/25/2023 per Dr. Dominic Friendly for reported rectal bleeding.  CT angio negative for active bleeding felt bleeding likely secondary to stercoral colitis seen on imaging which was also showing a stool ball in the rectum.  Her Plavix  and aspirin  initially held with hemoglobin stable at 11.  Her aspirin  resumed 12/19/2023 and Plavix  in 3 days beginning 12/29/2023.  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  Urine sodium 97 fluid restriction 1500 cc maintain on sodium chloride  tablet.  Therapy evaluations completed due to patient decreased functional mobility was admitted for a comprehensive rehab program   Hospital Course: GLENDALE WHERRY was admitted to rehab 12/27/2023 for inpatient therapies to consist of PT, ST and OT at least three hours five days a week. Past admission physiatrist, therapy team and rehab RN have worked together to provide customized collaborative inpatient rehab.  Pertaining to patient's ischemic nonhemorrhagic infarct involving the high left frontal lobe complicated by right displaced femoral neck fracture after a fall in the hospital.  Remained stable patient maintained on aspirin  and Plavix  x 3 weeks for CVA prophylaxis then Plavix  alone.  Venous Doppler studies 12/30/2023 showed age-indeterminate DVT right lower extremity mid to distal femoral vein and not felt to be a great candidate for anticoagulation with IVC filter placed per interventional radiology 12/31/2023.  Pain managed with use of oxycodone  monitoring mental status.  History of  memory loss followed by neurology services for dementia patient remained on Namenda  and was cooperating with therapies.  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  Right displaced femoral neck fracture after sustaining a fall while in the hospital status post right total hip arthroplasty 12/21/2023 per Dr. Curtiss Dowdy.  Weightbearing as tolerated.  Neurovascular sensation intact.  Acute blood loss anemia transfused 1 unit packed red blood cells 5/26.  Hospital course complicated by rectal bleeding follow-up GI services CT angio negative for active bleeding felt bleeding likely secondary to  sterocoral colitis.  She was cleared to continue aspirin  and Plavix  therapy.  Hyponatremia likely SIADH urine sodium 97 fluid restriction monitoring of chemistries placed on sodium chloride  tablets until latest sodium 137 .  History of severe aortic stenosis status post TAVR as well as nonobstructive CAD no chest pain shortness of breath follow-up outpatient cardiology services.  History of oral cancer with tongue resection followed outpatient [facility].  She did have a history of glaucoma continue eyedrops as indicated.  Blood pressure controlled on Toprol -XL monitoring with increased mobility.  Crestor  ongoing for hyperlipidemia as well as hormone supplement of Synthroid  for hypothyroidism.   Blood pressures were monitored on TID basis and and remained controlled and monitored     Rehab course: During patient's stay in rehab weekly team conferences were held to monitor patient's progress, set goals and discuss barriers to discharge. At admission, patient required +2 physical assist 35 feet to person hand-held assist moderate assist sit to stand  Physical exam.  Blood pressure 150/74 pulse 84 temperature 97.7 respiration 17 oxygen saturation is 98% room air Constitutional.  No acute distress HEENT Head.  Normocephalic and atraumatic Eyes.  Pupils round and reactive to light no discharge  without nystagmus Neck.  Supple nontender no JVD without thyromegaly Cardiac regular rate and rhythm without any extra sounds or murmur heard Abdomen.  Soft nontender positive bowel sounds without rebound Respiratory effort normal no respiratory distress without wheeze Musculoskeletal Right upper extremity 5/5 except 4/5 SA Left upper extremity 5/5 except 4/5 SA Right lower extremity 2/5 HF 3/5 KE otherwise 5/5 Left lower extremity 4/5 HF 4/5 KE 5/5 DF 5/5 EHL 5/5 PF Neurologic.  Patient alert oriented to person not place or time no dysarthria.  Names 3/3 objects correctly  He/She  has had improvement in activity tolerance, balance, postural control as well as ability to compensate for deficits. He/She has had improvement in functional use RUE/LUE  and RLE/LLE as well as improvement in awareness.  Sit to stand minimal assist to contact-guard.  Perform standing marches and step ups on 4 inch box with contact-guard overall for dynamic balance.  Ambulates to the room and set up in wheelchair with needs in reach and alarm active for patient's safety.  Patient stood from rolling walker supervision ambulates rolling walker to the bathroom .  Perform supine to sit edge of bed with supervision ambulates from edge of bed toilet and rolling walker with supervision.  PTA assisted with donning doffing personal lower body clothing/brief for time management performed anterior PERI care without assistance.Aaron Aas   SLP follow-up for cognition as well as any dysphagia.  SLP targeted cognitive goals through orientation task.  Patient aware she was in the hospital and did require total assist for awareness of situation.    Full family teaching completed and plan discharge to home       Disposition:  Discharge disposition: 06-Home-Health Care Svc        Diet: Regular  Special Instructions: No driving smoking or alcohol   Weightbearing as tolerated right lower extremity.  Medications at discharge 1.  Norvasc   5 mg p.o. daily 2.  Senokot-S 1 tablet p.o. twice daily 3.  Tylenol  as needed 4.  Plavix  75 mg p.o. daily 5.  Cosopt  ophthalmic solution 22.3-6.8 mg 1 drop left eye every 12 hours 6.Lumigan 0.01% 1 drop left eye at bedtime 7.  Synthroid  75 mcg p.o. daily 8.  Melatonin 5 mg nightly as needed 9.  Namenda  5 mg  p.o. nightly 10.  Toprol -XL 100 mg daily 11.  Multivitamin daily 12.  Oxycodone  2.5-5 mg every 8 hours as needed severe pain 13.  Protonix  40 mg p.o. twice daily 14.  Polyvinyl alcohol  tears 1.4% 1 drop both eyes bedtime as needed 15.  Prednisolone  acetate 1% 1 drop right eye every morning 16.  Crestor  5 mg p.o. daily 17.  Sodium chloride  tablet 1 g twice daily 18.  Calcium  carbonate 1 tablet daily after lunch 19.  Cholecalciferol 400 units daily after lunch 20.  Senokot S1 tablet p.o. twice daily   30-35 minutes were spent completing discharge summary and discharge planning  Discharge Instructions     Ambulatory referral to Neurology   Complete by: As directed    An appointment is requested in approximately: 4 weeks ischemic nonhemorrhagic left frontal lobe infarction   Ambulatory referral to Physical Medicine Rehab   Complete by: As directed    Moderate complexity follow-up 1 to 2 weeks ischemic left frontal lobe infarction        Follow-up Information     Kirsteins, Cecilia Coe, MD Follow up.   Specialty: Physical Medicine and Rehabilitation Why: Office to call for appointment Contact information: [address] [place] [address] [place] [address] [phone]         Odie Benne, MD Follow up.   Specialty: Cardiology Why: Call for appointment Contact information: [address] [place] [address] [phone]         Laneta Pintos, MD Follow up.   Specialty: Orthopedic Surgery Why: Call for appointment Contact information: [address] [place] [address] [phone]         Albertina Hugger, MD Follow up.    Specialty: Gastroenterology Why: Call for appointment as needed Contact information: [address] [place] [address] [phone]         Marland Silvas, MD Follow up.   Specialties: Interventional Radiology, Radiology Why: Call for appointment as needed Contact information: [address] [place] [address] [place] [address] [phone]                 Signed: Everlyn Hockey Teneil Shiller 01/11/2024, 4:34 AM

## 2023-12-28 NOTE — Plan of Care (Signed)
  Problem: RH Balance Goal: LTG Patient will maintain dynamic standing with ADLs (OT) Description: LTG:  Patient will maintain dynamic standing balance with assist during activities of daily living (OT)  Flowsheets (Taken 12/28/2023 1326) LTG: Pt will maintain dynamic standing balance during ADLs with: Contact Guard/Touching assist   Problem: Sit to Stand Goal: LTG:  Patient will perform sit to stand in prep for activites of daily living with assistance level (OT) Description: LTG:  Patient will perform sit to stand in prep for activites of daily living with assistance level (OT) Flowsheets (Taken 12/28/2023 1326) LTG: PT will perform sit to stand in prep for activites of daily living with assistance level: Supervision/Verbal cueing   Problem: RH Grooming Goal: LTG Patient will perform grooming w/assist,cues/equip (OT) Description: LTG: Patient will perform grooming with assist, with/without cues using equipment (OT) Flowsheets (Taken 12/28/2023 1326) LTG: Pt will perform grooming with assistance level of: Supervision/Verbal cueing   Problem: RH Bathing Goal: LTG Patient will bathe all body parts with assist levels (OT) Description: LTG: Patient will bathe all body parts with assist levels (OT) Flowsheets (Taken 12/28/2023 1326) LTG: Pt will perform bathing with assistance level/cueing: Contact Guard/Touching assist LTG: Position pt will perform bathing: Shower   Problem: RH Dressing Goal: LTG Patient will perform upper body dressing (OT) Description: LTG Patient will perform upper body dressing with assist, with/without cues (OT). Flowsheets (Taken 12/28/2023 1326) LTG: Pt will perform upper body dressing with assistance level of: Set up assist Goal: LTG Patient will perform lower body dressing w/assist (OT) Description: LTG: Patient will perform lower body dressing with assist, with/without cues in positioning using equipment (OT) Flowsheets (Taken 12/28/2023 1326) LTG: Pt will perform  lower body dressing with assistance level of: Contact Guard/Touching assist   Problem: RH Toileting Goal: LTG Patient will perform toileting task (3/3 steps) with assistance level (OT) Description: LTG: Patient will perform toileting task (3/3 steps) with assistance level (OT)  Flowsheets (Taken 12/28/2023 1326) LTG: Pt will perform toileting task (3/3 steps) with assistance level: Contact Guard/Touching assist   Problem: RH Toilet Transfers Goal: LTG Patient will perform toilet transfers w/assist (OT) Description: LTG: Patient will perform toilet transfers with assist, with/without cues using equipment (OT) Flowsheets (Taken 12/28/2023 1326) LTG: Pt will perform toilet transfers with assistance level of: Contact Guard/Touching assist   Problem: RH Tub/Shower Transfers Goal: LTG Patient will perform tub/shower transfers w/assist (OT) Description: LTG: Patient will perform tub/shower transfers with assist, with/without cues using equipment (OT) Flowsheets (Taken 12/28/2023 1326) LTG: Pt will perform tub/shower stall transfers with assistance level of: Contact Guard/Touching assist LTG: Pt will perform tub/shower transfers from: Tub/shower combination

## 2023-12-28 NOTE — Evaluation (Signed)
 Speech Language Pathology Assessment and Plan  Patient Details  Name: [name] MRN: [id number] Date of Birth: [birth year]  SLP Diagnosis: Cognitive Impairments;Dysphagia  Rehab Potential: Fair ELOS: 14-16 days    Today's Date: 12/28/2023 SLP Individual Time: 7829-5621 SLP Individual Time Calculation (min): 39 min   Hospital Problem: Principal Problem:   Ischemic cerebrovascular accident (CVA) [facility])  Past Medical History:  Past Medical History:  Diagnosis Date   Cataract    COPD (chronic obstructive pulmonary disease) (HCC)    Glaucoma    Goiter    Hypertension 02/21/2018   Hypothyroid    Memory loss    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 Abel Hoe & Dr. Honey Lusty   Scoliosis    Severe aortic stenosis    Wears glasses    Past Surgical History:  Past Surgical History:  Procedure Laterality Date   CESAREAN SECTION  07/31/1976   TWINS, ONE WAS STILLBORN   INTRAOPERATIVE TRANSTHORACIC ECHOCARDIOGRAM N/A 11/20/2023   Procedure: ECHOCARDIOGRAM, TRANSTHORACIC;  Surgeon: Odie Benne, MD;  Location: [facility] INVASIVE CV LAB;  Service: Cardiovascular;  Laterality: N/A;   MOUTH SURGERY  07/31/1994   tongue resection   NODULE REMOVED     BENIGN, LOWER NECK   RIGHT HEART CATH AND CORONARY ANGIOGRAPHY N/A 09/21/2023   Procedure: RIGHT HEART CATH AND CORONARY ANGIOGRAPHY;  Surgeon: Odie Benne, MD;  Location: [facility] INVASIVE CV LAB;  Service: Cardiovascular;  Laterality: N/A;   TOTAL HIP ARTHROPLASTY Right 12/21/2023   Procedure: ARTHROPLASTY, HIP, TOTAL, ANTERIOR APPROACH;  Surgeon: Laneta Pintos, MD;  Location: [facility] OR;  Service: Orthopedics;  Laterality: Right;  HEMI HIP    Assessment / Plan / Recommendation Clinical Impression HPI: PT is an 83 y/o female with PMH of aortic stenosis s/p TAVR (April  2025), 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 Stroke service was consulted and pt underwent full stroke workup. EF 65-70% and LV with grade 1 diastolic dysfunction, A1C 5.0. Recommendations for 30-day heart monitor as an outpatient, and aspiring/plavix x3 weeks then aspirin  alone. On HD2 pt with unwitnessed fall. Initial imaging negative on 5/21, but repeat imaging on 5/22 revealed a displaced angulated femoral neck fracture. Orthopedics was consulted and she underwent a right THA with Dr. Curtiss Dowdy on 5/23. Post op she is weightbearing as tolerated on the RLE. Post op course complicated by ABLA and she was transfused one unit of blood on 5/24. On 5/27 AM she developed BRBPR and GI consulted. Workup revealed large stool burden and it was felt likely that stercoral ulcer was cause of bleeding which was stopped. Nursing able to manually disimpact patient after enema and pt had no further bleeding. Therapy ongoing and pt has been recommended for CIR.   Clinical Impression:  Bedside Swallow Evaluation: A bedside swallow evaluation was completed to assess for s/sx of oropharyngeal dysphagia. Oral mechanism exam revealed partial glossectomy and generalized oral weakness. POs administered included thin liquids via straw and a small bite of regular solid. Patient with prolonged mastication and adequate oral  clearance, though likely due patients lethargy. No s/sx of aspiration present. PO trials d/c due to patient lethargy. Recommend continuation of regular/thin diet at this time with use of standardized precautions including sitting upright during PO and taking small bites/sips at a slow rate. Recommend full supervision during mealtimes.  Continue to evaluate dysphagia as patients alertness improves.  Communication: Patient with functional expressive/receptive language. Expression is characterized by 100% accuracy during confrontational and responsive naming task. Receptive language is remarkable for 100% accuracy during 1-3 step commands and simple/mildly-complex yes/no questions.  Cognition: Patient with severe cognitive deficits, though unsure if this is b/l due to hx of vascular dementia. Patient oriented to self and birthday, however unable to recall current location, situation, time nor age despite 71. Patient unable to locate call bell, however able to recall x1 use (bathroom) given maxA. Patient with poor sustained attention during evaluation, requiring total A and visual/verbal/tactile cues to continue activity. Patient with severe impairments in STM and LTM characterized by inability to recall basic biographical information (children's names, jobs) and activities completed this AM. Recommend targeting memory, basic problem solving in relation to safety, and sustained attention to tasks.  Dysarthria: 100% intelligibility - occasional low vocal quality, though may be due to lethargy Pt would benefit from skilled ST services to maximize cognition and dysphagia in order to maximize functional independence at d/c. Anticipate patient will require 24 hour supervision at d/c and f/u SLP services.    Skilled Therapeutic Interventions          Patient evaluated using a non-standardized cognitive linguistic assessment and bedside swallow evaluation to assess current cognitive, communicative and swallowing function. See above for details.    SLP Assessment  Patient will need skilled Speech Lanaguage Pathology Services during CIR admission    Recommendations  SLP Diet Recommendations: Age appropriate regular solids;Thin Liquid Administration via: Straw;Cup Medication Administration: Whole meds with liquid Supervision: Full  supervision/cueing for compensatory strategies;Staff to assist with self feeding Compensations: Slow rate;Small sips/bites Postural Changes and/or Swallow Maneuvers: Seated upright 90 degrees Oral Care Recommendations: Oral care BID Patient destination: Home Follow up Recommendations: Home Health SLP;Outpatient SLP;24 hour supervision/assistance Equipment Recommended: None recommended by SLP    SLP Frequency 3 to 5 out of 7 days   SLP Duration  SLP Intensity  SLP Treatment/Interventions 14-16 days  Minumum of 1-2 x/day, 30 to 90 minutes  Cognitive remediation/compensation;Dysphagia/aspiration precaution training;Internal/external aids;Cueing hierarchy;Therapeutic Activities;Functional tasks;Multimodal communication approach;Patient/family education    Pain None reported   SLP Evaluation Cognition Overall Cognitive Status: No family/caregiver present to determine baseline cognitive functioning Arousal/Alertness: Awake/alert Orientation Level: Oriented to person;Disoriented to place;Disoriented to time;Disoriented to situation Year: 2025 Month: November Day of Week: Incorrect Attention: Sustained Sustained Attention: Impaired Sustained Attention Impairment: Verbal basic Memory: Impaired Memory Impairment: Decreased recall of new information;Decreased long term memory;Decreased short term memory Decreased Long Term Memory: Verbal basic;Functional basic Decreased Short Term Memory: Functional basic;Verbal basic Awareness: Impaired Awareness Impairment: Intellectual impairment Problem Solving: Impaired Problem Solving Impairment: Verbal basic;Functional basic Safety/Judgment: Impaired  Comprehension Auditory Comprehension Overall Auditory Comprehension: Other (comment) (functional during simple tasks assessed) Yes/No Questions: Within Functional Limits Commands: Within Functional Limits Conversation: Simple Expression Expression Primary Mode of Expression: Verbal Verbal  Expression Overall Verbal Expression: Appears within functional limits for tasks assessed Initiation: No impairment Level of Generative/Spontaneous Verbalization: Sentence Naming: No impairment Written Expression Dominant Hand: Right Oral Motor Oral Motor/Sensory Function Overall Oral Motor/Sensory Function:  (partial glossectomy from lingual cancer) Motor Speech Overall Motor Speech: Appears  within functional limits for tasks assessed Respiration: Within functional limits Phonation: Normal Resonance: Within functional limits Articulation: Within functional limitis Intelligibility: Intelligible Motor Planning: Witnin functional limits  Care Tool Care Tool Cognition Ability to hear (with hearing aid or hearing appliances if normally used Ability to hear (with hearing aid or hearing appliances if normally used): 1. Minimal difficulty - difficulty in some environments (e.g. when person speaks softly or setting is noisy)   Expression of Ideas and Wants Expression of Ideas and Wants: 3. Some difficulty - exhibits some difficulty with expressing needs and ideas (e.g, some words or finishing thoughts) or speech is not clear   Understanding Verbal and Non-Verbal Content Understanding Verbal and Non-Verbal Content: 3. Usually understands - understands most conversations, but misses some part/intent of message. Requires cues at times to understand  Memory/Recall Ability Memory/Recall Ability : None of the above were recalled    Bedside Swallowing Assessment General Previous Swallow Assessment: none Diet Prior to this Study: Regular;Thin liquids (Level 0) Respiratory Status: Room air Behavior/Cognition: Lethargic/Drowsy;Cooperative;Pleasant mood Oral Cavity - Dentition: Adequate natural dentition Self-Feeding Abilities: Needs assist Patient Positioning: Upright in bed Baseline Vocal Quality: Low vocal intensity  Ice Chips Ice chips: Not tested Thin Liquid Thin Liquid: Within functional  limits Presentation: Straw Nectar Thick Nectar Thick Liquid: Not tested Honey Thick Honey Thick Liquid: Not tested Puree Puree: Not tested Solid Solid: Impaired Oral Phase Impairments: Impaired mastication Oral Phase Functional Implications: Impaired mastication BSE Assessment Risk for Aspiration Impact on safety and function: Mild aspiration risk Other Related Risk Factors: Lethargy;Cognitive impairment  Short Term Goals: Week 1: SLP Short Term Goal 1 (Week 1): Patient will utilize swallowing compensatory strategies during consumption of least restrictive diet given mod verbal A SLP Short Term Goal 2 (Week 1): Patient will demonstrate orientation to time, situation and location given max multimodal A SLP Short Term Goal 3 (Week 1): Patient will demonstrate problem solving abilities during basic daily situations given max multimodal A SLP Short Term Goal 4 (Week 1): Patient will demonstrate day to day recall of biographical information given max multimodal A SLP Short Term Goal 5 (Week 1): Patient will sustain attention for 3 minutes given max multimodal A  Refer to Care Plan for Long Term Goals  Recommendations for other services: None   Discharge Criteria: Patient will be discharged from SLP if patient refuses treatment 3 consecutive times without medical reason, if treatment goals not met, if there is a change in medical status, if patient makes no progress towards goals or if patient is discharged from hospital.  The above assessment, treatment plan, treatment alternatives and goals were discussed and mutually agreed upon: No family available/patient unable  Joe Tanney M.A., CCC-SLP 12/28/2023, 1:47 PM

## 2023-12-28 NOTE — Progress Notes (Signed)
 PROGRESS NOTE   Subjective/Complaints: Pt up in bed eating some of her breakfast. Trying to eat applesauce. No problems reported overnight  ROS: Limited due to cognitive/behavioral    Objective:   No results found. Recent Labs    12/27/23 0526 12/28/23 0705  WBC 8.4 8.8  HGB 11.2* 10.7*  HCT 33.3* 31.9*  PLT 390 421*   Recent Labs    12/27/23 0526 12/28/23 0705  NA 134* 127*  K 4.7 3.9  CL 102 97*  CO2 26 23  GLUCOSE 101* 101*  BUN 15 17  CREATININE 0.85 0.88  CALCIUM  9.3 8.8*   No intake or output data in the 24 hours ending 12/28/23 1610      Physical Exam: Vital Signs Blood pressure 134/74, pulse 80, temperature 98 F (36.7 C), temperature source Axillary, resp. rate 14, weight 58.3 kg, SpO2 96%.  General: Alert and oriented x 3, No apparent distress HEENT: Head is normocephalic, PERRLA, EOMI, sclera anicteric, oral mucosa pink and moist, dentition intact, ext ear canals clear, bruising about right eye.  Neck: Supple without JVD or lymphadenopathy Heart: Reg rate and rhythm. No murmurs rubs or gallops Chest: CTA bilaterally without wheezes, rales, or rhonchi; no distress Abdomen: Soft, non-tender, non-distended, bowel sounds positive. Extremities: No clubbing, cyanosis, or edema. Pulses are 2+ Psych: Pt's affect is appropriate. Pt is cooperative Skin: scattered bruising. Right hip incision with immediate post-op dressing in place. No drainage visible thru dressing Neuro:  Pt is oriented to self, hospital when given cues. Recalled that she injured her right hip. Follows basic commands. CN exam non-focal. MMT: RUE grossly 4 to 4+/5 prox to distal. LUE 4+ to 5/5. RLE limited by hip but distally exhibits 4-5/5 ADF/PF. LLE 4/5 prox to 5/5 distally. Sensory exam normal for light touch and pain in all 4 limbs. No limb ataxia or cerebellar signs. No abnormal tone appreciated.   Musculoskeletal: Right thigh with  swelling, appropriately tender with palpation and attempts at ROM    Assessment/Plan: 1. Functional deficits which require 3+ hours per day of interdisciplinary therapy in a comprehensive inpatient rehab setting. Physiatrist is providing close team supervision and 24 hour management of active medical problems listed below. Physiatrist and rehab team continue to assess barriers to discharge/monitor patient progress toward functional and medical goals  Care Tool:  Bathing              Bathing assist       Upper Body Dressing/Undressing Upper body dressing        Upper body assist      Lower Body Dressing/Undressing Lower body dressing            Lower body assist       Toileting Toileting    Toileting assist       Transfers Chair/bed transfer  Transfers assist           Locomotion Ambulation   Ambulation assist              Walk 10 feet activity   Assist           Walk 50 feet activity   Assist  Walk 150 feet activity   Assist           Walk 10 feet on uneven surface  activity   Assist           Wheelchair     Assist               Wheelchair 50 feet with 2 turns activity    Assist            Wheelchair 150 feet activity     Assist          Blood pressure 134/74, pulse 80, temperature 98 F (36.7 C), temperature source Axillary, resp. rate 14, weight 58.3 kg, SpO2 96%.  Medical Problem List and Plan: 1. Functional deficits secondary to ischemic nonhemorrhagic infarct involving the high left frontal lobe complicated by right displaced femoral neck fracture after a fall in the hospital             -patient may shower             -ELOS/Goals: 14-16 days, SPV PT, OT, Min A SLP             - Stable for IRF admission   2.  Antithrombotics: -DVT/anticoagulation:  Mechanical: Antiembolism stockings, thigh (TED hose) Bilateral lower extremities.   vascular study pending              -antiplatelet therapy: Aspirin  81 mg daily and Plavix 75 mg daily x 3 weeks then Plavix alone.PLAVIX HELD UNTIL 5/31 DUE TO RECTAL BLEEDING 3. Pain Management: Oxycodone  as needed 4. Mood/Behavior/Sleep/dementia: Namenda  5 mg nightly, melatonin 5 mg nightly as needed             -antipsychotic agents: N/A             -Continue Telesitter for poor safety awareness 5. Neuropsych/cognition: This patient is not capable of making decisions on her own behalf. 6. Skin/Wound Care: Routine skin checks 7. Fluids/Electrolytes/Nutrition: pt has reasonable appetite it appears but cognition might interfere with adequate follow through during meals 8.  Right displaced femoral neck fracture.  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 Curtiss Dowdy.  WBAT RLE with hip precautions 9.  Acute blood loss anemia/rectal bleeding.  Transfuse 1 unit packed red blood cells 5/26.     5/30 -Hgb sl decreased at 10.7---continue to monitor   -will recheck hgb tomorrow again      -Follow-up GI services.  CT angio negative for active bleeding felt bleeding likely secondary to stercoral colitis.        -Aspirin  resumed 5/28 and plan to RESUME PLAVIX 31/2025.       -Continue Protonix 11.  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  Urine sodium 97,serum osmo 366 -5/30 further drop to 127 today -tighten Fluid restriction to 1200 cc.   - continue P.o. sodium chloride  -recheck bmet in AM 12.  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  Follow-up cardiology services 13.  History of oral cancer with tongue resection.  Follow-up outpatient [facility] 14.  Glaucoma.  Continue eyedrops as indicated 15.  Hypertension.  Toprol-XL 100 mg daily.  Monitor with increased mobility 16.  Hyperlipidemia.  Crestor  17.  Hypothyroidism.  Synthroid  18.  Constipation.  Senokot S1 tablet twice daily as well as Dulcolax suppository   -LBM 5/28  LOS: 1 days A FACE TO FACE  EVALUATION WAS PERFORMED  Rawland Caddy 12/28/2023, 9:52 AM

## 2023-12-29 ENCOUNTER — Inpatient Hospital Stay (HOSPITAL_COMMUNITY)

## 2023-12-29 DIAGNOSIS — I10 Essential (primary) hypertension: Secondary | ICD-10-CM | POA: Diagnosis not present

## 2023-12-29 DIAGNOSIS — E871 Hypo-osmolality and hyponatremia: Secondary | ICD-10-CM

## 2023-12-29 DIAGNOSIS — I639 Cerebral infarction, unspecified: Secondary | ICD-10-CM | POA: Diagnosis not present

## 2023-12-29 DIAGNOSIS — D62 Acute posthemorrhagic anemia: Secondary | ICD-10-CM | POA: Diagnosis not present

## 2023-12-29 DIAGNOSIS — M7989 Other specified soft tissue disorders: Secondary | ICD-10-CM

## 2023-12-29 DIAGNOSIS — K5901 Slow transit constipation: Secondary | ICD-10-CM

## 2023-12-29 LAB — HEMOGLOBIN AND HEMATOCRIT, BLOOD
HCT: 31.8 % — ABNORMAL LOW (ref 36.0–46.0)
Hemoglobin: 10.8 g/dL — ABNORMAL LOW (ref 12.0–15.0)

## 2023-12-29 LAB — BASIC METABOLIC PANEL WITH GFR
Anion gap: 13 (ref 5–15)
BUN: 22 mg/dL (ref 8–23)
CO2: 23 mmol/L (ref 22–32)
Calcium: 9.2 mg/dL (ref 8.9–10.3)
Chloride: 94 mmol/L — ABNORMAL LOW (ref 98–111)
Creatinine, Ser: 1.07 mg/dL — ABNORMAL HIGH (ref 0.44–1.00)
GFR, Estimated: 52 mL/min — ABNORMAL LOW (ref >60)
Glucose, Bld: 116 mg/dL — ABNORMAL HIGH (ref 70–99)
Potassium: 3.8 mmol/L (ref 3.5–5.1)
Sodium: 130 mmol/L — ABNORMAL LOW (ref 135–145)

## 2023-12-29 MED ORDER — SORBITOL 70 % SOLN
30.0000 mL | Freq: Once | Status: AC
  Administered 2023-12-29: 30 mL via ORAL
  Filled 2023-12-29: qty 30

## 2023-12-29 NOTE — Progress Notes (Signed)
 Occupational Therapy Session Note  Patient Details  Name: [name] MRN: [id number] Date of Birth: 01-20-1941  Today's Date: 12/29/2023 OT Individual Time: 1000-1015 OT Individual Time Calculation (min): 15 min    Short Term Goals: Week 1:  OT Short Term Goal 1 (Week 1): Pt will be able to don pants with min A using AE as needed OT Short Term Goal 2 (Week 1): Pt will be able to self cleanse post toileting with min A. OT Short Term Goal 3 (Week 1): Pt will sit to stand with CGA at [facility].  Skilled Therapeutic Interventions/Progress Updates: Dtr present and participated in patient education as this clinician attempted to arouse and engage her mother.   She stated she had been in patient room for 5 minutes and when she tried to wake her mother she stated, "No" and went back to sleep.                      Patient supine in bed partially lying on left side off right hip.   This clinician attempted to arouse with various sensory input and head elevation and speech.  Patient did not open eyes  except to grimace as cold cloth was applied to face.    Clinician repositioned patient head for cervical safe alignment and to placed rolled towel against plastic headboard near patient face (safety in case she moved quickly her head against the hard plastic).  Patient was left lying in bed with bed alarm engaged and call bell on bed.   This clinician also assisted patient's daughter to operate recliner chair as she preferred to sit in it to rest rather than the standard chair with mid support only up to mid back.  Patient did state that her mother's breakfast was on tray with only a bite or two taken and most of her Ensure.   She voiced concern on whether her mother had eaten enough.  Assumption was that perhaps [name] had fallen asleep rather than finishing her breakfast.  Patient will benefit from continued OT services to address goals and family education will be beneficial as well.  Continue patient's  OT POC     Therapy Documentation Precautions:  Precautions Precautions: Fall Precaution/Restrictions Comments: Alz/Dementia Restrictions Weight Bearing Restrictions Per Provider Order: Yes RLE Weight Bearing Per Provider Order: Weight bearing as tolerated General: General OT Amount of Missed Time: 45 Minutes Vital Signs:  Pain:did not respond but no signs according to facial expression or verbal sounds   ADL:    Perception    Praxis Praxis: Impaired Praxis Impairment Details: Motor planning;Initiation Balance   Exercises:   Other Treatments:     Therapy/Group: Individual Therapy  Carrol Clam [facility] 12/29/2023, 11:18 AM

## 2023-12-29 NOTE — Progress Notes (Signed)
 Patient rested well most of the night. Patient did not want dinner tray but did drink half of ensure. Incontinent bladder x2, continent x1. Patient remains alert and oriented to self only. Patient denied pain and no signs of pain noted during bed mobility.

## 2023-12-29 NOTE — Plan of Care (Signed)
  Problem: RH Balance Goal: LTG Patient will maintain dynamic standing balance (PT) Description: LTG:  Patient will maintain dynamic standing balance with assistance during mobility activities (PT) Flowsheets (Taken 12/29/2023 0627) LTG: Pt will maintain dynamic standing balance during mobility activities with:: Supervision/Verbal cueing Note: Close supervision   Problem: Sit to Stand Goal: LTG:  Patient will perform sit to stand with assistance level (PT) Description: LTG:  Patient will perform sit to stand with assistance level (PT) Flowsheets (Taken 12/29/2023 0627) LTG: PT will perform sit to stand in preparation for functional mobility with assistance level: Supervision/Verbal cueing Note: Close supervision   Problem: RH Bed Mobility Goal: LTG Patient will perform bed mobility with assist (PT) Description: LTG: Patient will perform bed mobility with assistance, with/without cues (PT). Flowsheets (Taken 12/29/2023 0627) LTG: Pt will perform bed mobility with assistance level of: Supervision/Verbal cueing   Problem: RH Bed to Chair Transfers Goal: LTG Patient will perform bed/chair transfers w/assist (PT) Description: LTG: Patient will perform bed to chair transfers with assistance (PT). Flowsheets (Taken 12/29/2023 0627) LTG: Pt will perform Bed to Chair Transfers with assistance level: Supervision/Verbal cueing Note: Close supervision   Problem: RH Car Transfers Goal: LTG Patient will perform car transfers with assist (PT) Description: LTG: Patient will perform car transfers with assistance (PT). Flowsheets (Taken 12/29/2023 0627) LTG: Pt will perform car transfers with assist:: Contact Guard/Touching assist   Problem: RH Furniture Transfers Goal: LTG Patient will perform furniture transfers w/assist (OT/PT) Description: LTG: Patient will perform furniture transfers  with assistance (OT/PT). Flowsheets (Taken 12/29/2023 0627) LTG: Pt will perform furniture transfers with assist::  Supervision/Verbal cueing Note: Close supervision   Problem: RH Ambulation Goal: LTG Patient will ambulate in controlled environment (PT) Description: LTG: Patient will ambulate in a controlled environment, # of feet with assistance (PT). Flowsheets (Taken 12/29/2023 0627) LTG: Pt will ambulate in controlled environ  assist needed:: Supervision/Verbal cueing LTG: Ambulation distance in controlled environment: more than 100 ft using LRAD Note: Close supervision Goal: LTG Patient will ambulate in home environment (PT) Description: LTG: Patient will ambulate in home environment, # of feet with assistance (PT). Flowsheets (Taken 12/29/2023 0627) LTG: Pt will ambulate in home environ  assist needed:: Supervision/Verbal cueing LTG: Ambulation distance in home environment: up to 50 ft per bout using LRAD Note: Close supervision   Problem: RH Wheelchair Mobility Goal: LTG Patient will propel w/c in controlled environment (PT) Description: LTG: Patient will propel wheelchair in controlled environment, # of feet with assist (PT) Flowsheets (Taken 12/29/2023 0627) LTG: Pt will propel w/c in controlled environ  assist needed:: Contact Guard/Touching assist LTG: Propel w/c distance in controlled environment: up to 50 ft   Problem: RH Stairs Goal: LTG Patient will ambulate up and down stairs w/assist (PT) Description: LTG: Patient will ambulate up and down # of stairs with assistance (PT) Flowsheets (Taken 12/29/2023 0627) LTG: Pt will ambulate up/down stairs assist needed:: Contact Guard/Touching assist LTG: Pt will  ambulate up and down number of stairs: at least 3 steps with HR setup as per home environment

## 2023-12-29 NOTE — Progress Notes (Signed)
 PROGRESS NOTE   Subjective/Complaints:  Pt refusing to wake up for exam, will grimace and say "no" when asked to do anything. Says she "doesn't know" if she slept well but overnight reports were that she did sleep well. Denies pain multiple times during eval. Unsure of LBM, last documented BM was 5/28 but could have been more recently.  Asked several times to wake up and open eyes and she says "no" everytime. Unable to fully assess or provide much history.   ROS: Limited due to cognitive/behavioral    Objective:   No results found. Recent Labs    12/27/23 0526 12/28/23 0705 12/29/23 0456  WBC 8.4 8.8  --   HGB 11.2* 10.7* 10.8*  HCT 33.3* 31.9* 31.8*  PLT 390 421*  --    Recent Labs    12/28/23 0705 12/29/23 0456  NA 127* 130*  K 3.9 3.8  CL 97* 94*  CO2 23 23  GLUCOSE 101* 116*  BUN 17 22  CREATININE 0.88 1.07*  CALCIUM  8.8* 9.2    Intake/Output Summary (Last 24 hours) at 12/29/2023 0840 Last data filed at 12/28/2023 2200 Gross per 24 hour  Intake 220 ml  Output --  Net 220 ml        Physical Exam: Vital Signs Blood pressure (!) 134/55, pulse 91, temperature 98.2 F (36.8 C), temperature source Oral, resp. rate 18, height 5\' 6"  (1.676 m), weight 58.3 kg, SpO2 95%.  General: asleep, easily aroused but refuses to open her eyes, says "no" when asked to do anything, No apparent distress but difficult to assess.  HEENT: Head is normocephalic, oral membranes a bit dry, bruising about right eye.  Neck: Supple without JVD Heart: Reg rate and rhythm. No murmurs rubs or gallops appreciated Chest: CTA bilaterally without wheezes, rales, or rhonchi; no distress Abdomen: Soft, non-tender, non-distended, bowel sounds positive. Extremities: No clubbing, cyanosis, or edema. Pulses are 2+ Psych: pt uncooperative, refusing to open eyes though responding verbally multiple times to provider.  Skin: scattered bruising.  Right hip incision with immediate post-op dressing in place. No drainage visible thru dressing Neuro: refuses to participate, responds verbally but mostly says "no" when asked if she will wake up or participate. Grimaces and withdraws to painful stimuli but still refuses to wake up/open eyes for provider.   PRIOR EXAMS: Neuro:  Pt is oriented to self, hospital when given cues. Recalled that she injured her right hip. Follows basic commands. CN exam non-focal. MMT: RUE grossly 4 to 4+/5 prox to distal. LUE 4+ to 5/5. RLE limited by hip but distally exhibits 4-5/5 ADF/PF. LLE 4/5 prox to 5/5 distally. Sensory exam normal for light touch and pain in all 4 limbs. No limb ataxia or cerebellar signs. No abnormal tone appreciated.   Musculoskeletal: Right thigh with swelling, appropriately tender with palpation and attempts at ROM    Assessment/Plan: 1. Functional deficits which require 3+ hours per day of interdisciplinary therapy in a comprehensive inpatient rehab setting. Physiatrist is providing close team supervision and 24 hour management of active medical problems listed below. Physiatrist and rehab team continue to assess barriers to discharge/monitor patient progress toward functional and medical goals  Care  Tool:  Bathing    Body parts bathed by patient: Right arm, Left arm, Chest, Abdomen, Right upper leg, Left upper leg, Face, Front perineal area   Body parts bathed by helper: Buttocks, Left lower leg, Right lower leg     Bathing assist Assist Level: Moderate Assistance - Patient 50 - 74%     Upper Body Dressing/Undressing Upper body dressing   What is the patient wearing?: Pull over shirt    Upper body assist Assist Level: Minimal Assistance - Patient > 75%    Lower Body Dressing/Undressing Lower body dressing      What is the patient wearing?: Underwear/pull up, Pants     Lower body assist Assist for lower body dressing: Maximal Assistance - Patient 25 - 49%      Toileting Toileting    Toileting assist Assist for toileting: Moderate Assistance - Patient 50 - 74%     Transfers Chair/bed transfer  Transfers assist     Chair/bed transfer assist level: Minimal Assistance - Patient > 75%     Locomotion Ambulation   Ambulation assist      Assist level: Minimal Assistance - Patient > 75% Assistive device: Walker-rolling Max distance: 10 ft   Walk 10 feet activity   Assist     Assist level: Minimal Assistance - Patient > 75% Assistive device: Walker-rolling   Walk 50 feet activity   Assist Walk 50 feet with 2 turns activity did not occur: Safety/medical concerns         Walk 150 feet activity   Assist Walk 150 feet activity did not occur: Safety/medical concerns         Walk 10 feet on uneven surface  activity   Assist Walk 10 feet on uneven surfaces activity did not occur: Safety/medical concerns         Wheelchair     Assist Is the patient using a wheelchair?: Yes Type of Wheelchair: Manual    Wheelchair assist level: Dependent - Patient 0% Max wheelchair distance: 15 ft    Wheelchair 50 feet with 2 turns activity    Assist    Wheelchair 50 feet with 2 turns activity did not occur: Safety/medical concerns       Wheelchair 150 feet activity     Assist  Wheelchair 150 feet activity did not occur: Safety/medical concerns       Blood pressure (!) 134/55, pulse 91, temperature 98.2 F (36.8 C), temperature source Oral, resp. rate 18, height 5\' 6"  (1.676 m), weight 58.3 kg, SpO2 95%.  Medical Problem List and Plan: 1. 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   Antithrombotics: -DVT/anticoagulation:  Mechanical: Antiembolism stockings, thigh (TED hose) Bilateral lower extremities.   vascular study pending -antiplatelet therapy: Aspirin  81 mg daily and Plavix  75 mg daily x 3 weeks then Plavix  alone.PLAVIX  HELD UNTIL 5/31 DUE TO RECTAL BLEEDING 3. Pain Management: Oxycodone  as needed 4. Mood/Behavior/Sleep/dementia: Namenda  5 mg nightly, melatonin 5 mg nightly as needed             -antipsychotic agents:  N/A             -Continue Telesitter for poor safety awareness -12/29/23 pt refusing to open eyes or participate today; will see if she is more agreeable tomorrow, but may need to decrease sedating meds or if neurostimulating med would be of help 5. Neuropsych/cognition: This patient is not capable of making decisions on her own behalf. 6. Skin/Wound Care: Routine skin checks 7. Fluids/Electrolytes/Nutrition: pt has reasonable appetite it appears but cognition might interfere with adequate follow through during meals -12/29/23 inadequate intake of meals recently; continue to encourage 8.  Right displaced femoral neck fracture.  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 Curtiss Dowdy.  WBAT RLE with hip precautions 9.  Acute blood loss anemia/rectal bleeding.  Transfuse 1 unit packed red blood cells 5/26.     5/30 -Hgb sl decreased at 10.7---continue to monitor   -will recheck hgb tomorrow again-- stable 10.8 on 5/31 -Follow-up GI services.  CT angio negative for active bleeding felt bleeding likely secondary to stercoral colitis.        -Aspirin  resumed 5/28 and plan to RESUME PLAVIX  31/2025.       -Continue Protonix  40mg  BID 11.  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  Urine sodium 97,serum osmo 366 -5/30 further drop to 127 today -tighten Fluid restriction to 1200 cc.   - continue P.o. sodium chloride  1g BID -recheck bmet in AM -12/29/23 Na 130 improved from yesterday; cont regimen, recheck Monday 12.  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  Follow-up cardiology services 13.  History of oral cancer with tongue resection.  Follow-up outpatient [facility] 14.  Glaucoma.  Continue eyedrops as indicated 15.  Hypertension.  Toprol -XL 100 mg daily.  Norvasc  5mg  daily. Monitor with increased mobility  -12/29/23 BPs fine, monitor Vitals:   12/27/23 1559 12/27/23 1956 12/28/23 0540 12/28/23 0542  BP: 131/68 (!) 142/73 134/74 134/74   12/28/23 1559 12/28/23 1944 12/29/23 0333 12/29/23 0904  BP: 129/60 (!) 128/54 (!) 134/55 (!) 120/56   12/29/23 1249  BP: (!) 111/56    16.  Hyperlipidemia.  Crestor  5mg  daily 17.  Hypothyroidism.  Synthroid  75mcg daily 18.  Constipation.  Senokot S1 tablet twice daily as well as Dulcolax suppository   -LBM 5/28 -12/29/23 still no BM documented since 5/28 though poor PO lately; will give sorbitol 30ml once  LOS: 2 days A FACE TO FACE EVALUATION WAS PERFORMED  [address] 12/29/2023, 8:40 AM

## 2023-12-29 NOTE — Progress Notes (Signed)
 VASCULAR LAB    Bilateral lower extremity venous duplex has been performed.  See CV proc for preliminary results.   Relayed results to Dr. Dorn Gaskins and Criselda Dolly, RN  Lanette Pipe, [facility], RVT 12/29/2023, 4:30 PM

## 2023-12-29 NOTE — Plan of Care (Signed)
  Problem: Consults Goal: RH STROKE PATIENT EDUCATION Description: See Patient Education module for education specifics  Outcome: Progressing   Problem: RH BOWEL ELIMINATION Goal: RH STG MANAGE BOWEL WITH ASSISTANCE Description: STG Manage Bowel with toileting Assistance. Outcome: Progressing Goal: RH STG MANAGE BOWEL W/MEDICATION W/ASSISTANCE Description: STG Manage Bowel with Medication with mod I Assistance. Outcome: Progressing   Problem: RH SAFETY Goal: RH STG ADHERE TO SAFETY PRECAUTIONS W/ASSISTANCE/DEVICE Description: STG Adhere to Safety Precautions With cues Assistance/Device. Outcome: Progressing   Problem: RH COGNITION-NURSING Goal: RH STG USES MEMORY AIDS/STRATEGIES W/ASSIST TO PROBLEM SOLVE Description: STG Uses Memory Aids/Strategies With cues Assistance to Problem Solve. Outcome: Progressing Goal: RH STG ANTICIPATES NEEDS/CALLS FOR ASSIST W/ASSIST/CUES Description: STG Anticipates Needs/Calls for Assist With Assistance/Cues. Outcome: Progressing   Problem: RH PAIN MANAGEMENT Goal: RH STG PAIN MANAGED AT OR BELOW PT'S PAIN GOAL Description: Pain < 4 with prns Outcome: Progressing   Problem: RH KNOWLEDGE DEFICIT Goal: RH STG INCREASE KNOWLEDGE OF HYPERTENSION Description: Patient's dtr will be able to manage HTN using educational resources for medication and dietary modification independently Outcome: Progressing Goal: RH STG INCREASE KNOWLEGDE OF HYPERLIPIDEMIA Description: Patient's dtr will be able to manage HLD using educational resources for medication and dietary modification independently Outcome: Progressing Goal: RH STG 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 Description: Patient's dtr will be able to manage secondary risks using educational resources for medication and dietary modification independently Outcome: Progressing   Problem: Activity: Goal: Ability to ambulate and perform ADLs will improve Outcome: Progressing

## 2023-12-29 NOTE — Progress Notes (Signed)
 Speech Language Pathology Daily Session Note  Patient Details  Name: [name] MRN: [id number] Date of Birth: 05/23/1941  Today's Date: 12/29/2023 SLP Individual Time: 1101-1113 SLP Individual Time Calculation (min): 12 min  Short Term Goals: Week 1: SLP Short Term Goal 1 (Week 1): Patient will utilize swallowing compensatory strategies during consumption of least restrictive diet given mod verbal A SLP Short Term Goal 2 (Week 1): Patient will demonstrate orientation to time, situation and location given max multimodal A SLP Short Term Goal 3 (Week 1): Patient will demonstrate problem solving abilities during basic daily situations given max multimodal A SLP Short Term Goal 4 (Week 1): Patient will demonstrate day to day recall of biographical information given max multimodal A SLP Short Term Goal 5 (Week 1): Patient will sustain attention for 3 minutes given max multimodal A  Skilled Therapeutic Interventions: Upon entrance, patient asleep in bed. Daughter reporting patients extreme fatigue and inability to participate in tx this AM. Daughter requests hold off on tx this date to let patient rest.   SLP targeted family education goals with daughter through reviewing patients performance on evaluation completed yesterday. SLP educated daughter [name]) on deficits noted in orientation, simple problem solving, short/long term memory and attention. SLP provided information on tx care plan and goals for inpatient rehabilitation admission. Daughter reports she has noticed a significant change in cognition since admission, though no noted changes in swallowing. Daughter verbalized understanding of education provided with no further questions. Patient left in bed with alarm set and call bell in reach. Continue POC.   Pain None verbalized/visualized   Therapy/Group: Individual Therapy  Milford Cilento M.A., CCC-SLP 12/29/2023, 7:48 AM

## 2023-12-29 NOTE — Evaluation (Signed)
 Physical Therapy Assessment and Plan  Patient Details  Name: [name] MRN: [id number] Date of Birth: 05-Aug-1940  PT Diagnosis: Cognitive deficits, Difficulty walking, Impaired cognition, Muscle weakness, and Osteoarthritis Rehab Potential: Good ELOS: 10-12 days   Today's Date: 12/29/2023 PT Individual Time: 0904-1004 PT Individual Time Calculation (min): 60 min  Hospital Problem: Principal Problem:   Ischemic cerebrovascular accident (CVA) [facility])   Past Medical History:  Past Medical History:  Diagnosis Date   Cataract    COPD (chronic obstructive pulmonary disease) (HCC)    Glaucoma    Goiter    Hypertension 02/21/2018   Hypothyroid    Memory loss    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 Abel Hoe & Dr. Honey Lusty   Scoliosis    Severe aortic stenosis    Wears glasses    Past Surgical History:  Past Surgical History:  Procedure Laterality Date   CESAREAN SECTION  07/31/1976   TWINS, ONE WAS STILLBORN   INTRAOPERATIVE TRANSTHORACIC ECHOCARDIOGRAM N/A 11/20/2023   Procedure: ECHOCARDIOGRAM, TRANSTHORACIC;  Surgeon: Odie Benne, MD;  Location: [facility] IN[facility];  Service: Cardiovascular;  Laterality: N/A;   MOUTH SURGERY  07/31/1994   tongue resection   NODULE REMOVED     BENIGN, LOWER NECK   RIGHT HEART CATH AND CORONARY ANGIOGRAPHY N/A 09/21/2023   Procedure: RIGHT HEART CATH AND CORONARY ANGIOGRAPHY;  Surgeon: Odie Benne, MD;  Location: [facility] CV [facility];  Service: Cardiovascular;  Laterality: N/A;   TOTAL HIP ARTHROPLASTY Right 12/21/2023   Procedure: ARTHROPLASTY, HIP, TOTAL, ANTERIOR APPROACH;  Surgeon: Laneta Pintos, MD;  Location: [facility] OR;  Service: Orthopedics;  Laterality: Right;  HEMI HIP    Assessment & Plan Clinical Impression: Patient is a 83 y.o. right-handed  female with history of COPD with pulmonary nodule and quit smoking 29 years ago, osteoporosis, severe aortic stenosis status post TAVR 4/25, mild nonobstructive CAD followed by Dr. Antoinette Batman, oral cancer with tongue resection followed at [facility], diagnosed mild Alzheimer's and vascular dementia followed at [facility] neurology, hypertension, hyperlipidemia, hypothyroidism. Per chart review patient lives with daughter. 1 level home 3 steps to entry. Daughter works during the day. Independent mobility prior to admission with no assistive device, will furniture surf and likes to walk to the mailbox to get the mail. There has been reports of recent falls. Typically she was able to do some small cooking and cleaning. Daughter manages medicines and finances. Presented 12/18/2023 due to increasing confusion over the past 5 days with associated unsteady gait. Cranial CT scan negative for acute changes. 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 Underlying age-related cerebral atrophy with moderately advanced chronic microvascular ischemic disease. CTA with no emergent findings. Admission chemistries unremarkable except CO2 21, creatinine 1.05, GFR 53, ammonia levels unremarkable, urinalysis negative nitrite. Neurology follow-up. Patient did not receive TNK. Echocardiogram with ejection fraction of 65 to 70% grade 1 diastolic dysfunction no regional wall motion abnormalities. Neurology follow-up maintained on aspirin  81 mg daily with the addition of Plavix  75 mg daily x 3 weeks then Plavix  alone. Recommendations of 30-day heart monitor after discharge an order has been placed. Hospital course complicated by unwitnessed fall while in the hospital 12/19/2023 with no loss of consciousness  sustaining a right displaced femoral neck fracture. Patient was cleared for surgery by cardiology. Underwent right total hip arthroplasty 12/21/2023  per Dr. Curtiss Dowdy and advised weightbearing as tolerated right lower extremity.. She did have postoperative anemia 9.4 and transfused 1 unit 5/26. Gastroenterology services consulted 12/25/2023 Dr. Dominic Friendly for reported rectal bleeding. 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 Her Plavix  and aspirin  were initially held with hemoglobin stable at 11. Her aspirin  has been resumed 12/26/2023 and Plavix  in 3 days beginning 12/29/2023. 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 Urine sodium 97 fluid restriction 1500 cc and maintained on sodium chloride  tablet. Therapy evaluations completed due to patient's decreased functional mobility was admitted for a comprehensive rehab program.   Patient transferred to CIR on 12/27/2023 .   Patient currently requires mod assist with mobility secondary to muscle weakness, decreased cardiorespiratoy endurance, decreased motor planning, decreased initiation, decreased problem solving, decreased safety awareness, decreased memory, and delayed processing, and decreased standing balance, decreased postural control, and decreased balance strategies.  Prior to hospitalization, patient was modified independent  with mobility and lived with Daughter (poor historian - PLOF info from [place]) in a House home.  Home access is 3Stairs to enter.  Patient will benefit from skilled PT intervention to maximize safe functional mobility, minimize fall risk, and decrease caregiver burden for planned discharge home with 24 hour supervision.  Anticipate patient will benefit from follow up HH at discharge.  PT - End of Session Activity Tolerance: Tolerates 30+ min activity with multiple rests Endurance Deficit: Yes PT Assessment Rehab Potential (ACUTE/IP ONLY): Good PT Barriers to Discharge: Inaccessible home environment;Decreased caregiver support;Lack of/limited family  support;Incontinence;Insurance for SNF coverage;Medication compliance;Behavior PT Barriers to Discharge Comments: Alz/ dementia PT Patient demonstrates impairments in the following area(s): Balance;Behavior;Edema;Endurance;Motor;Safety PT Transfers Functional Problem(s): Bed Mobility;Bed to Chair;Car;Furniture PT Locomotion Functional Problem(s): Ambulation;Wheelchair Mobility;Stairs PT Plan PT Intensity: Minimum of 1-2 x/day ,45 to 90 minutes PT Frequency: 5 out of 7 days PT Duration Estimated Length of Stay: 10-12 days PT Treatment/Interventions: Ambulation/gait training;Cognitive remediation/compensation;Discharge planning;DME/adaptive equipment instruction;Functional mobility training;Pain management;Psychosocial support;Splinting/orthotics;Therapeutic Activities;UE/LE Strength taining/ROM;Visual/perceptual remediation/compensation;Balance/vestibular training;Community reintegration;Disease management/prevention;Neuromuscular re-education;Patient/family education;Skin care/wound management;Stair training;Therapeutic Exercise;UE/LE Coordination activities;Wheelchair propulsion/positioning PT Transfers Anticipated Outcome(s): close supervision PT Locomotion Anticipated Outcome(s): close supervision/ SBA PT Recommendation Recommendations for Other Services: None Follow Up Recommendations: Home health PT;24 hour supervision/assistance Patient destination: Home Equipment Recommended: To be determined   PT Evaluation Precautions/Restrictions Precautions Precautions: Fall Precaution/Restrictions Comments: Alz/Dementia Restrictions Weight Bearing Restrictions Per Provider Order: Yes RLE Weight Bearing Per Provider Order: Weight bearing as tolerated General   Vital SignsTherapy Vitals Temp: 98.2 F (36.8 C) Temp Source: Oral Pulse Rate: 91 Resp: 18 BP: (!) 134/55 Patient Position (if appropriate): Lying Oxygen Therapy SpO2: 95 % O2 Device: Room Air Pain Pain Assessment Pain Scale:  0-10 Pain Score: 0-No pain Pain Interference Pain Interference Pain Effect on Sleep: 0. Does not apply - I have not had any pain or hurting in the past 5 days Pain Interference with Therapy Activities: 8. Unable to answer (poor memory) Pain Interference with Day-to-Day Activities: 8. Unable to answer (poor memory) Home Living/Prior Functioning Home Living Available Help at Discharge: Family;Available PRN/intermittently Type of Home: House Home Access: Stairs to enter [employer] of Steps: 3 Entrance Stairs-Rails: None Home Layout: One level Bathroom Shower/Tub: Engineer, manufacturing systems: Standard Additional Comments: daughter works during the day,  Lives With: Daughter (poor  historian - PLOF info from [facility]) Prior Function Level of Independence: Independent with basic ADLs;Independent with gait;Requires assistive device for independence;Independent with transfers;Independent with homemaking with ambulation  Able to Take Stairs?: Yes Driving: No Vocation: Retired Optometrist - History Ability to See in Adequate Light: 1 Impaired Perception Perception: Within Functional Limits Praxis Praxis: Impaired Praxis Impairment Details: Motor planning;Initiation  Cognition Overall Cognitive Status: No family/caregiver present to determine baseline cognitive functioning Arousal/Alertness: Awake/alert Orientation Level: Oriented to person Sustained Attention: Impaired Memory: Impaired Memory Impairment: Decreased recall of new information;Decreased long term memory;Decreased short term memory Awareness: Impaired Problem Solving: Impaired Executive Function: Reasoning;Organizing;Initiating;Decision Making Reasoning: Impaired Organizing: Impaired Decision Making: Impaired Initiating: Impaired Behaviors: Perseveration Safety/Judgment: Impaired Sensation Sensation Light Touch: Appears Intact Coordination Gross Motor Movements are Fluid and Coordinated:  No Fine Motor Movements are Fluid and Coordinated: Yes Coordination and Movement Description: functional UE coordination, RLE limited s/p THA, slow to initiate and perform all movements Heel Shin Test: unable to follow instructions Motor  Motor Motor - Skilled Clinical Observations: limited by decreased strength s/p R THA   Trunk/Postural Assessment  Cervical Assessment Cervical Assessment: Exceptions to [facility] (forward head) Thoracic Assessment Thoracic Assessment: Exceptions to [facility] (rounded shoulders) Lumbar Assessment Lumbar Assessment: Exceptions to [facility] (posterior pelvic tilt) Postural Control Postural Control: Deficits on evaluation Trunk Control: posterior lean in standing with no AD (has FOF)  Balance Balance Balance Assessed: Yes Static Sitting Balance Static Sitting - Balance Support: Bilateral upper extremity supported;Feet supported Static Sitting - Level of Assistance: 5: Stand by assistance Dynamic Sitting Balance Dynamic Sitting - Balance Support: Bilateral upper extremity supported;Feet supported Dynamic Sitting - Level of Assistance: 4: Min Oncologist Standing - Balance Support: Bilateral upper extremity supported;During functional activity Static Standing - Level of Assistance: 4: Min assist Dynamic Standing Balance Dynamic Standing - Balance Support: During functional activity;Bilateral upper extremity supported Dynamic Standing - Level of Assistance: 3: Mod assist Extremity Assessment      RLE Assessment RLE Assessment: Exceptions to [facility] General Strength Comments: unable to follow instructions for MMT, functionally 3+/5 LLE Assessment LLE Assessment: Exceptions to [facility] General Strength Comments: unable to follow instructions for MMT, functionally 4-/5  Care Tool Care Tool Bed Mobility Roll left and right activity   Roll left and right assist level: Minimal Assistance - Patient > 75%    Sit to lying activity   Sit to lying  assist level: Minimal Assistance - Patient > 75%    Lying to sitting on side of bed activity   Lying to sitting on side of bed assist level: the ability to move from lying on the back to sitting on the side of the bed with no back support.: Minimal Assistance - Patient > 75%     Care Tool Transfers Sit to stand transfer   Sit to stand assist level: Moderate Assistance - Patient 50 - 74%    Chair/bed transfer   Chair/bed transfer assist level: Minimal Assistance - Patient > 75%    Car transfer Car transfer activity did not occur: Safety/medical concerns        Care Tool Locomotion Ambulation   Assist level: Minimal Assistance - Patient > 75% Assistive device: Walker-rolling Max distance: 10 ft  Walk 10 feet activity   Assist level: Minimal Assistance - Patient > 75% Assistive device: Walker-rolling   Walk 50 feet with 2 turns activity Walk 50 feet with 2 turns activity did not occur: Safety/medical concerns  Walk 150 feet activity Walk 150 feet activity did not occur: Safety/medical concerns      Walk 10 feet on uneven surfaces activity Walk 10 feet on uneven surfaces activity did not occur: Safety/medical concerns      Stairs Stair activity did not occur: Safety/medical concerns        Walk up/down 1 step activity Walk up/down 1 step or curb (drop down) activity did not occur: Safety/medical concerns      Walk up/down 4 steps activity Walk up/down 4 steps activity did not occur: Safety/medical concerns      Walk up/down 12 steps activity Walk up/down 12 steps activity did not occur: Safety/medical concerns      Pick up small objects from floor Pick up small object from the floor (from standing position) activity did not occur: Safety/medical concerns      Wheelchair Is the patient using a wheelchair?: Yes Type of Wheelchair: Manual   Wheelchair assist level: Dependent - Patient 0% Max wheelchair distance: 15 ft  Wheel 50 feet with 2 turns activity  Wheelchair 50 feet with 2 turns activity did not occur: Safety/medical concerns    Wheel 150 feet activity Wheelchair 150 feet activity did not occur: Safety/medical concerns      Refer to Care Plan for Long Term Goals  SHORT TERM GOAL WEEK 1 PT Short Term Goal 1 (Week 1): Pt will perform bed mobility with increased speed of initiation and overall CGA PT Short Term Goal 2 (Week 1): Pt will perform standing transfers with CGA using LRAD. PT Short Term Goal 3 (Week 1): Pt will ambulate at least 50 ft using LRAD with CGA. PT Short Term Goal 4 (Week 1): Pt will initiate stair training. PT Short Term Goal 5 (Week 1): Pt will complete TUG for outcome measure.  Recommendations for other services: None   Skilled Therapeutic Intervention Mobility Bed Mobility Bed Mobility: Rolling Right;Rolling Left;Supine to Sit;Sit to Supine Rolling Right: Minimal Assistance - Patient > 75% Rolling Left: Minimal Assistance - Patient > 75% Supine to Sit: Minimal Assistance - Patient > 75% Sit to Supine: Minimal Assistance - Patient > 75% Transfers Transfers: Sit to Stand;Stand to Sit;Stand Pivot Transfers Sit to Stand: Moderate Assistance - Patient 50-74% Stand to Sit: Minimal Assistance - Patient > 75% Stand Pivot Transfers: Moderate Assistance - Patient 50 - 74% Stand Pivot Transfer Details: Tactile cues for sequencing;Tactile cues for posture;Verbal cues for precautions/safety;Verbal cues for gait pattern;Verbal cues for safe use of DME/AE;Verbal cues for sequencing;Verbal cues for technique Transfer (Assistive device): Rolling walker Locomotion  Gait Ambulation: Yes Gait Assistance: Minimal Assistance - Patient > 75% Gait Distance (Feet): 10 Feet Assistive device: Rolling walker Gait Assistance Details: Tactile cues for initiation;Tactile cues for sequencing;Verbal cues for sequencing;Verbal cues for technique;Verbal cues for precautions/safety;Verbal cues for gait pattern;Verbal cues for safe use  of DME/AE Gait Gait: Yes Gait Pattern: Antalgic;Step-through pattern;Decreased stance time - right;Decreased step length - right Gait velocity: decreased Stairs / Additional Locomotion Stairs: No Wheelchair Mobility Wheelchair Mobility: No  Skilled Intervention: PT Evaluation completed; see above for results. PT educated patient in current location and reasoning for rehab, roles of PT vs OT, PT POC, rehab potential, rehab goals, and discharge recommendations along with recommendation for follow-up rehabilitation services. Individual treatment initiated:  Patient supine in bed upon PT arrival. Patient initially sleeping but easily roused, then alert and agreeable to PT session.   No pain complaint throughout session.   Therapeutic Activity: Bed Mobility: On initiating bed  mobility pt and bed noted to be soaked with urine. Patient performed supine <> sit with overall MinA but was very slow to demo understanding and then to initiate/ execute. Provided vc/ tc for attn to task, technique, effort. Transfers: Patient performed sit <> stand and stand pivot transfers throughout session with ModA initially in transferring bed > w/c. Is then able to complete toilet transfer, w/c>bed transfer with MinA overall. Is slow to process and initiate all mobility.  Provided vc/ tc for sequencing with walker, maintaining focus on task, and requiring facilitation for direction to turn.  Toilet transfer requires max cues for turning with BUE to safety rail in bathroom. She is able to follow some cues for technique and assist with dressing. Unaware of need to wash up so pt cleaned while standing with CGA for balance and MaxA for cleaning/ pericare. Pt continues to physically perseverate on wet gown. Guided in doffing old, wet gown and provided with arm holes of new gown with pt seeing and initiating donning to correct arms. Brief donned with MaxA.   Gait Training:  Patient ambulated 10' x2 using RW with up to MinA  from therapist for balance. When asked, she relates no increase in pain with weight bearing. Demonstrated flexed posture with ambulation and on return trip demo'd significant increase in fatigue. Difficulty with processing turn required once reaching bed in order to face proper direction.  Provided vc/tc for directionality, sequencing positioning and use of RW.  Patient supine in bed  at end of session with brakes locked, bed alarm set, and all needs within reach. Quickly falls asleep.   Discharge Criteria: Patient will be discharged from PT if patient refuses treatment 3 consecutive times without medical reason, if treatment goals not met, if there is a change in medical status, if patient makes no progress towards goals or if patient is discharged from hospital.  The above assessment, treatment plan, treatment alternatives and goals were discussed and mutually agreed upon: by patient  Donne Gage PT, DPT, CSRS 12/29/2023, 5:38 AM

## 2023-12-29 NOTE — Progress Notes (Signed)
 Physical Therapy Session Note  Patient Details  Name: [name] MRN: [id number] Date of Birth: Oct 05, 1940  Today's Date: 12/29/2023 PT Individual Time: 4696-2952 PT Individual Time Calculation (min): 68 min   Short Term Goals: Week 1:  PT Short Term Goal 1 (Week 1): Pt will perform bed mobility with increased speed of initiation and overall CGA PT Short Term Goal 2 (Week 1): Pt will perform standing transfers with CGA using LRAD. PT Short Term Goal 3 (Week 1): Pt will ambulate at least 50 ft using LRAD with CGA. PT Short Term Goal 4 (Week 1): Pt will initiate stair training. PT Short Term Goal 5 (Week 1): Pt will complete TUG for outcome measure.  Skilled Therapeutic Interventions/Progress Updates: Patient supine in bed on entrance to room. Patient alert and agreeable to PT session.   Patient with no complaints of pain. Pt required increased time to initiate movement throughout session when given VC. Pt daughter expressed frustration with acute care handling of not having alarm set prior to fall, and her brother finding pt on floor. PTA provided active listening. Pt cued to donn personal pants (maxA to thread with cues to elevate LE's) by performing bridge. Pt performed supine<sit on EOB with min/light modA this session due to fatigue, and not having been out of been since yesterday (VC for sequence/hand placement). Pt sitting EOB and scooted to edge with CGA. maxA to don personal shoes. Pt performed sit<>stand transfers throughout session with min/heavy minA to RW with cues for hand placement. Pt ambulated from EOB to nsg station in RW with increased time required for pt to initiate gait cycle, VC to increase step length and to look forward ahead (visual target to keep eyes on flowers at nsg station). Pt daughter followed in [facility]. Overall pt CGA to ambulate, and required VC throughout to remain on task as pt frequently would stop and stand and not initiate movement. PTA attempted ambulation with  AD and only L HHA, but pt presented with increased hesitation to WB on R LE. Pt provided with RW again after quick sit and ambulated a few more steps and requested to sit due to fatigue. Pt transported outside [facility] in [facility] to boost morale as pt has not been outside since admission. Pt and daughter thankful. Pt cued to stand outside with L HHA, but pt reported fatigue. PTA stated that if return to room at that time to rest, if pt is willing to sit up in [facility]. Pt stated willingness to do so with education provided to pt and daughter to have pt sitting OOB as much as possible to boost tolerance to activity. Pt transported back to room. Pt stated need to use toilet. NT notified to assist.  Patient sitting in WC at end of session with brakes locked, daughter present, chair alarm set, and all needs within reach.      Therapy Documentation Precautions:  Precautions Precautions: Fall Precaution/Restrictions Comments: Alz/Dementia Restrictions Weight Bearing Restrictions Per Provider Order: Yes RLE Weight Bearing Per Provider Order: Weight bearing as tolerated  Therapy/Group: Individual Therapy  Bird Swetz PTA 12/29/2023, 4:38 PM

## 2023-12-30 DIAGNOSIS — D62 Acute posthemorrhagic anemia: Secondary | ICD-10-CM | POA: Diagnosis not present

## 2023-12-30 DIAGNOSIS — I82411 Acute embolism and thrombosis of right femoral vein: Secondary | ICD-10-CM

## 2023-12-30 DIAGNOSIS — I639 Cerebral infarction, unspecified: Secondary | ICD-10-CM | POA: Diagnosis not present

## 2023-12-30 DIAGNOSIS — I10 Essential (primary) hypertension: Secondary | ICD-10-CM | POA: Diagnosis not present

## 2023-12-30 DIAGNOSIS — K5901 Slow transit constipation: Secondary | ICD-10-CM | POA: Diagnosis not present

## 2023-12-30 NOTE — Progress Notes (Signed)
 Occupational Therapy Session Note  Patient Details  Name: [name] MRN: [id number] Date of Birth: [birth year]  Today's Date: 12/30/2023 OT Individual Time: 1140-1205 OT Individual Time Calculation (min): 25 min    Short Term Goals: Week 1:  OT Short Term Goal 1 (Week 1): Pt will be able to don pants with min A using AE as needed OT Short Term Goal 2 (Week 1): Pt will be able to self cleanse post toileting with min A. OT Short Term Goal 3 (Week 1): Pt will sit to stand with CGA at [facility].  Skilled Therapeutic Interventions/Progress Updates:      Therapy Documentation Precautions:  Precautions Precautions: Fall Precaution/Restrictions Comments: Alz/Dementia Restrictions Weight Bearing Restrictions Per Provider Order: Yes RLE Weight Bearing Per Provider Order: Weight bearing as tolerated General: "Hello" Pt supine in bed upon OT arrival, agreeable to OT session. Daughter requesting to get pt OOB and changing clothing. Daughter and grandchildren present.  Pain: no pain reported  ADL: OT providing skilled intervention on ADL retraining in order to increase independence with tasks and increase activity tolerance. Pt completed the following tasks at the current level of assist: Bed mobility: Max A supine>EOB, was able to scoot to [facility] with Min A, assistance required with managing RLE  UB dressing: Max A overall, difficulty with sequencing rote tasks without multimodal cueing  LB dressing: Max A overall, pt standing at RW CGA while OT and daughter assisting with pants management. Noted soiled brief, change at total A and assisted with quick wash up  Transfers: sit to stand Min A, transfer from bed>W/C using stand step method Min A, heavy VC for sequencing steps, initially small shuffled steps, increased stride length with step by step cuing   Pt seated in W/C at end of session with W/C alarm donned, call light within reach and 4Ps assessed. Daughter and grandchildren  present   Therapy/Group: Individual Therapy  Nila Barth, OTD, OTR/L 12/30/2023, 12:51 PM

## 2023-12-30 NOTE — Progress Notes (Signed)
 PROGRESS NOTE   Subjective/Complaints:  Pt more awake today, says she slept well yesterday. Denies pain. Unsure of LBM but chart review appears to show several BMs between yesterday and this morning. Urinating ok per pt, incontinent per documentation. Denies any other complaints or concerns, although history might still be limited d/t cognition. PO intake has been poor, asked if she wants to sit up to eat and she says she's not sure.   ROS: Limited due to cognition, however denies CP, SOB, abd pain, n/v/d.    Objective:   VAS US  LOWER EXTREMITY VENOUS (DVT) Result Date: 12/29/2023  Lower Venous DVT Study Patient Name:  [name]  Date of Exam:   12/29/2023 Medical Rec #: [id number]      Accession #:    [id number] Date of Birth: April 10, 1941      Patient Gender: F Patient Age:   83 years Exam Location:  [facility] Procedure:      VAS US  LOWER EXTREMITY VENOUS (DVT) Referring Phys: Georjean Kite --------------------------------------------------------------------------------  Indications: Status post unwitnessed fall in hospital after CVA resulting in right displaced femoral neck fracture and right total hip arthroplasty. Now in rehab unit.  Risk Factors: Immobility. Limitations: Pain with compression and rotation of legs and bandages at proximal thigh and groin. Comparison Study: No prior study on file Performing Technologist: Carleene Chase RVS  Examination Guidelines: A complete evaluation includes B-mode imaging, spectral Doppler, color Doppler, and power Doppler as needed of all accessible portions of each vessel. Bilateral testing is considered an integral part of a complete examination. Limited examinations for reoccurring indications may be performed as noted. The reflux portion of the exam is performed with the patient in reverse Trendelenburg.  +---------+---------------+---------+-----------+----------+-------------------+  RIGHT    CompressibilityPhasicitySpontaneityPropertiesThrombus Aging      +---------+---------------+---------+-----------+----------+-------------------+ CFV                     Yes      Yes                  patent by color and                                                       Doppler             +---------+---------------+---------+-----------+----------+-------------------+ SFJ                     Yes      Yes                  patent by color and                                                       Doppler             +---------+---------------+---------+-----------+----------+-------------------+ FV Prox  Yes      Yes                  patent by color and                                                       Doppler             +---------+---------------+---------+-----------+----------+-------------------+ FV Mid   None           No       No                   Age Indeterminate   +---------+---------------+---------+-----------+----------+-------------------+ FV DistalNone           No       No                   Age Indeterminate   +---------+---------------+---------+-----------+----------+-------------------+ PFV                     Yes      Yes                  patent by color and                                                       Doppler             +---------+---------------+---------+-----------+----------+-------------------+ POP      Full           Yes      Yes                                      +---------+---------------+---------+-----------+----------+-------------------+ PTV      Full                                                             +---------+---------------+---------+-----------+----------+-------------------+ PERO     Full                                                             +---------+---------------+---------+-----------+----------+-------------------+    +---------+---------------+---------+-----------+----------+-------------------+ LEFT     CompressibilityPhasicitySpontaneityPropertiesThrombus Aging      +---------+---------------+---------+-----------+----------+-------------------+ CFV      Full           Yes      Yes                                      +---------+---------------+---------+-----------+----------+-------------------+ SFJ      Full                                                             +---------+---------------+---------+-----------+----------+-------------------+  FV Prox  Full           Yes      Yes                                      +---------+---------------+---------+-----------+----------+-------------------+ FV Mid   Full                                                             +---------+---------------+---------+-----------+----------+-------------------+ FV DistalFull           Yes      Yes                                      +---------+---------------+---------+-----------+----------+-------------------+ PFV      Full                                                             +---------+---------------+---------+-----------+----------+-------------------+ POP                     Yes      Yes                  patent by color and                                                       Doppler             +---------+---------------+---------+-----------+----------+-------------------+ PTV      Full                                                             +---------+---------------+---------+-----------+----------+-------------------+ PERO     Full                                                             +---------+---------------+---------+-----------+----------+-------------------+    Summary: RIGHT: - Findings consistent with age indeterminate deep vein thrombosis involving the Mid to distal femoral vein.   LEFT: - There is no evidence of deep  vein thrombosis in the lower extremity.  *See table(s) above for measurements and observations.    Preliminary    Recent Labs    12/28/23 0705 12/29/23 0456  WBC 8.8  --   HGB 10.7* 10.8*  HCT 31.9* 31.8*  PLT 421*  --    Recent Labs    12/28/23 0705 12/29/23 0456  NA 127* 130*  K 3.9 3.8  CL 97* 94*  CO2 23 23  GLUCOSE 101* 116*  BUN 17 22  CREATININE 0.88 1.07*  CALCIUM  8.8* 9.2    Intake/Output Summary (Last 24 hours) at 12/30/2023 1102 Last data filed at 12/29/2023 1906 Gross per 24 hour  Intake 160 ml  Output --  Net 160 ml        Physical Exam: Vital Signs Blood pressure 130/65, pulse (!) 102, temperature 98.3 F (36.8 C), resp. rate 18, height 5\' 6"  (1.676 m), weight 58.3 kg, SpO2 94%.  General: awake, more alert, in NAD HEENT: Head is normocephalic, oral membranes a bit dry, bruising about right eye.  Neck: Supple without JVD Heart: Reg rate and rhythm. No rubs or gallops appreciated Chest: CTA bilaterally without wheezes, rales, or rhonchi; no distress Abdomen: Soft, non-tender, non-distended, bowel sounds positive. Extremities: No clubbing, cyanosis, or edema. Pulses are 2+. MAE in bed.  Psych: more awake and pleasant, more cooperative.  Skin: scattered bruising. Right hip incision with immediate post-op dressing in place. No drainage visible thru dressing-- not reassessed 6/1 Neuro: more awake and alert, oriented to self but not really to time/situation, unclear if oriented to place.    PRIOR EXAMS: Neuro:  Pt is oriented to self, hospital when given cues. Recalled that she injured her right hip. Follows basic commands. CN exam non-focal. MMT: RUE grossly 4 to 4+/5 prox to distal. LUE 4+ to 5/5. RLE limited by hip but distally exhibits 4-5/5 ADF/PF. LLE 4/5 prox to 5/5 distally. Sensory exam normal for light touch and pain in all 4 limbs. No limb ataxia or cerebellar signs. No abnormal tone appreciated.   Musculoskeletal: Right thigh with swelling,  appropriately tender with palpation and attempts at ROM    Assessment/Plan: 1. Functional deficits which require 3+ hours per day of interdisciplinary therapy in a comprehensive inpatient rehab setting. Physiatrist is providing close team supervision and 24 hour management of active medical problems listed below. Physiatrist and rehab team continue to assess barriers to discharge/monitor patient progress toward functional and medical goals  Care Tool:  Bathing    Body parts bathed by patient: Right arm, Left arm, Chest, Abdomen, Right upper leg, Left upper leg, Face, Front perineal area   Body parts bathed by helper: Buttocks, Left lower leg, Right lower leg     Bathing assist Assist Level: Moderate Assistance - Patient 50 - 74%     Upper Body Dressing/Undressing Upper body dressing   What is the patient wearing?: Pull over shirt    Upper body assist Assist Level: Minimal Assistance - Patient > 75%    Lower Body Dressing/Undressing Lower body dressing      What is the patient wearing?: Underwear/pull up, Pants     Lower body assist Assist for lower body dressing: Maximal Assistance - Patient 25 - 49%     Toileting Toileting    Toileting assist Assist for toileting: Moderate Assistance - Patient 50 - 74%     Transfers Chair/bed transfer  Transfers assist     Chair/bed transfer assist level: Minimal Assistance - Patient > 75%     Locomotion Ambulation   Ambulation assist      Assist level: Minimal Assistance - Patient > 75% Assistive device: Walker-rolling Max distance: 10 ft   Walk 10 feet activity   Assist     Assist level: Minimal Assistance - Patient > 75% Assistive device: Walker-rolling   Walk 50 feet activity   Assist Walk 50 feet with  2 turns activity did not occur: Safety/medical concerns         Walk 150 feet activity   Assist Walk 150 feet activity did not occur: Safety/medical concerns         Walk 10 feet on uneven  surface  activity   Assist Walk 10 feet on uneven surfaces activity did not occur: Safety/medical concerns         Wheelchair     Assist Is the patient using a wheelchair?: Yes Type of Wheelchair: Manual    Wheelchair assist level: Dependent - Patient 0% Max wheelchair distance: 15 ft    Wheelchair 50 feet with 2 turns activity    Assist    Wheelchair 50 feet with 2 turns activity did not occur: Safety/medical concerns       Wheelchair 150 feet activity     Assist  Wheelchair 150 feet activity did not occur: Safety/medical concerns       Blood pressure 130/65, pulse (!) 102, temperature 98.3 F (36.8 C), resp. rate 18, height 5\' 6"  (1.676 m), weight 58.3 kg, SpO2 94%.  Medical Problem List and Plan: 1. 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  Antithrombotics: -DVT/anticoagulation:  Mechanical: Antiembolism stockings, thigh (TED hose) Bilateral lower extremities -12/30/23 DVT U/S yesterday showing age indeterminate DVTs of RLE mid to distal femoral vein, not a great anticoagulation candidate-- called IR, spoke with Dr. Julietta Ogren who will review case and decide if IVC filter is necessary-- updated family (Daughter [name], number in chart)-- will await further instructions but plan for IVC filter for now.  -antiplatelet therapy: Aspirin  81 mg daily and Plavix  75 mg daily x 3 weeks then Plavix  alone.PLAVIX  HELD UNTIL 5/31 DUE TO RECTAL BLEEDING 3. Pain Management: Oxycodone  as needed 4. Mood/Behavior/Sleep/dementia: Namenda  5 mg nightly, melatonin 5 mg nightly as needed             -antipsychotic agents: N/A             -Continue Telesitter for poor safety  awareness -12/29/23 pt refusing to open eyes or participate today; will see if she is more agreeable tomorrow, but may need to decrease sedating meds or if neurostimulating med would be of help -12/30/23 pt more awake today, will leave meds alone but be cautious with sedating meds-- not sure if med for initiation would be good.  5. Neuropsych/cognition: This patient is not capable of making decisions on her own behalf. 6. Skin/Wound Care: Routine skin checks 7. Fluids/Electrolytes/Nutrition: pt has reasonable appetite it appears but cognition might interfere with adequate follow through during meals -12/29/23 inadequate intake of meals recently; continue to encourage -12/30/23 still very poor intake, discussed with nursing, will change diet order to include full assist/supervision so staff can aid/encourage 8.  Right displaced femoral neck fracture.  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 Curtiss Dowdy.  WBAT RLE with hip precautions 9.  Acute blood loss anemia/rectal bleeding.  Transfuse 1 unit packed red blood cells 5/26.     5/30 -Hgb sl decreased at 10.7---continue to monitor -  will recheck hgb tomorrow again-- stable 10.8 on 5/31, recheck Monday -Follow-up GI services.  CT angio negative for active bleeding felt bleeding likely secondary to stercoral colitis.        -Aspirin  resumed 5/28 and plan to RESUME PLAVIX  12/29/2023.       -Continue Protonix  40mg  BID 11.  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  Urine sodium 97,serum osmo 366 -5/30 further drop to 127 today -tighten Fluid restriction to 1200 cc.   - continue P.o. sodium chloride  1g BID -recheck bmet in AM -12/29/23 Na 130 improved from yesterday; cont regimen, recheck Monday 12.  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  Follow-up cardiology services 13.  History of oral cancer with tongue resection.  Follow-up outpatient [facility] 14.  Glaucoma.  Continue eyedrops  as indicated 15.  Hypertension.  Toprol -XL 100 mg daily.  Norvasc  5mg  daily. Monitor with increased mobility  -5/31-6/1 BPs fine, monitor Vitals:   12/27/23 1559 12/27/23 1956 12/28/23 0540 12/28/23 0542  BP: 131/68 (!) 142/73 134/74 134/74   12/28/23 1559 12/28/23 1944 12/29/23 0333 12/29/23 0904  BP: 129/60 (!) 128/54 (!) 134/55 (!) 120/56   12/29/23 1249 12/29/23 1444 12/29/23 1958 12/30/23 0518  BP: (!) 111/56 (!) 108/55 (!) 125/56 130/65    16.  Hyperlipidemia.  Crestor  5mg  daily 17.  Hypothyroidism.  Synthroid  75mcg daily 18.  Constipation.  Senokot S1 tablet twice daily as well as Dulcolax suppository   -LBM 5/28 -12/29/23 still no BM documented since 5/28 though poor PO lately; will give sorbitol 30ml once -12/30/23 BMs 3x yesterday into this morning, monitor   I spent >43mins performing patient care related activities, including face to face time x2, documentation time, review of results, consultation time, discussion of results with patient and nursing staff and daughter Zerina Hallinan [phone]), and overall coordination of care.   LOS: 3 days A FACE TO FACE EVALUATION WAS PERFORMED  [address] 12/30/2023, 11:02 AM

## 2023-12-30 NOTE — Progress Notes (Signed)
 Patient demonstrates decreased appetite. Required assistance with feeding. Patient ate slowly, taking small bites and chewing for prolonged periods. Despite encouragement, patient was only able to eat small amount (about 30%). Tolerated feeding without signs of distress. Will continue to monitor nutritional intake and tolerance.

## 2023-12-30 NOTE — Consult Note (Signed)
 Chief Complaint: Patient was seen in consultation today for RLE DVT  Referring Physician(s): [address] PA-C  Supervising Physician: Elene Griffes  Patient Status: [facility] - In-pt  History of Present Illness: [name] is a 83 y.o. female with past medical history of COPD, HTN, severe aortic stenosis s/p RHC 09/2023 and TAVR 11/20/23 who presented to the ED with confusion and unsteady gait found to have a small left high frontal punctate stroke.  She was started on DAPT.  Unfortunately she also subsequently sustained an unwitnessed fall OOB resulting in right femoral neck fracture requiring THA 12/21/23.  She is now admitted to CIR for rehabilitation. Prior to transfer to CIR she had an episode of BRBPR with drop in Hgb.  She receivied 1u PRBC with improvement. CT imaging showed likely culpit has large rectal stool burden.  She has since resumed DAPT.  A LE doppler study performed yesterday showed age indeterminate clot in the mid right femoral vein. IR now consulted for possible IVC filter placement given on DAPT with recent rectal bleeding.  Patient at high risk for ongoing/recurrent bleed with additional anticoagulation.   Patient assessed at bedside alongside family.  She is sleeping soundly.  Does not participate in exam or conversation.  Family states she has not been as active or alert since transferring to CIR.  Son, [name] is her POA.   Patient is a FULL CODE.  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 Abel Hoe & Dr. Honey Lusty   Scoliosis    Severe aortic stenosis    Wears glasses     Past Surgical History:  Procedure Laterality  Date   CESAREAN SECTION  07/31/1976   TWINS, ONE WAS STILLBORN   INTRAOPERATIVE TRANSTHORACIC ECHOCARDIOGRAM N/A 11/20/2023   Procedure: ECHOCARDIOGRAM, TRANSTHORACIC;  Surgeon: Odie Benne, MD;  Location: [facility] INVASIVE CV [facility];  Service: Cardiovascular;  Laterality: N/A;   MOUTH SURGERY  07/31/1994   tongue resection   NODULE REMOVED     BENIGN, LOWER NECK   RIGHT HEART CATH AND CORONARY ANGIOGRAPHY N/A 09/21/2023   Procedure: RIGHT HEART CATH AND CORONARY ANGIOGRAPHY;  Surgeon: Odie Benne, MD;  Location: [facility]VAS[facility] CV [facility];  Service: Cardiovascular;  Laterality: N/A;   TOTAL HIP ARTHROPLASTY Right 12/21/2023   Procedure: ARTHROPLASTY, HIP, TOTAL, ANTERIOR APPROACH;  Surgeon: Laneta Pintos, MD;  Location: [facility] OR;  Service: Orthopedics;  Laterality: Right;  HEMI HIP    Allergies: Brimonidine, Fluocinolone acetonide, Naphazoline-polyethyl glycol, Triamcinolone acetonide, and Tropicamide  Medications: Prior to Admission medications   Medication Sig Start Date End Date Taking? Authorizing Provider  acetaminophen  (TYLENOL ) 325 MG tablet Take 2 tablets (650 mg total) by mouth every 6 (six) hours. 12/27/23   Gonfa, Taye T, MD  amLODipine  (NORVASC ) 5 MG tablet Take 1 tablet (5 mg total) by mouth Grant. 12/27/23   Gonfa, Taye T, MD  aspirin  81 MG chewable tablet Chew 1 tablet (81 mg total) by mouth Grant for 15 days. 12/27/23 01/11/24  Gonfa, Taye T, MD  bisacodyl  (DULCOLAX) 10 MG suppository  Place 1 suppository (10 mg total) rectally Grant as needed for moderate constipation. 12/27/23   Gonfa, Taye T, MD  Calcium  Carbonate (CALTRATE 600 PO) Take 1 tablet by mouth Grant after lunch.    [provider]  Cholecalciferol (VITAMIN D3) 10 MCG (400 UNIT) tablet Take 400 Units by mouth Grant after lunch.    [provider]  clopidogrel  (PLAVIX ) 75 MG tablet Take 1 tablet (75 mg total) by mouth Grant. 12/29/23 12/28/24  Gonfa, Taye T, MD  dorzolamide -timolol  (COSOPT )  22.3-6.8 MG/ML ophthalmic solution Place 1 drop into the left eye every 12 (twelve) hours.    [provider]  feeding supplement (ENSURE ENLIVE / ENSURE PLUS) LIQD Take 237 mLs by mouth 2 (two) times Grant between meals. 12/27/23   Gonfa, Taye T, MD  HYDROcodone -acetaminophen  (NORCO/VICODIN) 5-325 MG tablet Take 1 tablet by mouth every 6 (six) hours as needed for severe pain (pain score 7-10). 12/25/23   Versie Gores, PA-C  levothyroxine  (SYNTHROID ) 75 MCG tablet Take 75 mcg by mouth Grant after lunch.    [provider]  LUMIGAN 0.01 % SOLN Place 1 drop into the left eye at bedtime. 04/13/20   [provider]  memantine  (NAMENDA ) 5 MG tablet TAKE 1 TABLET BY MOUTH EVERYDAY AT BEDTIME 06/14/23   Millikan, Megan, NP  metoprolol  succinate (TOPROL -XL) 100 MG 24 hr tablet Take 100 mg by mouth Grant after lunch. 03/20/20   [provider]  Multiple Vitamin (MULTIVITAMIN WITH MINERALS) TABS tablet Take 1 tablet by mouth Grant after lunch. Centrum Silver    [provider]  pantoprazole  (PROTONIX ) 40 MG tablet Take 1 tablet (40 mg total) by mouth Grant. 12/27/23   Gonfa, Taye T, MD  polyvinyl alcohol  (ARTIFICIAL TEARS) 1.4 % ophthalmic solution Place 1 drop into both eyes as needed for dry eyes.    [provider]  prednisoLONE  acetate (PREDNISOLONE  ACETATE P-F) 1 % ophthalmic suspension Place 1 drop into the right eye in the morning.    [provider]  rosuvastatin  (CRESTOR ) 5 MG tablet Take 5 mg by mouth Grant after lunch.    [provider]  senna-docusate (SENOKOT-S) 8.6-50 MG tablet Take 1 tablet by mouth 2 (two) times Grant between meals as needed for mild constipation. 12/27/23   Gonfa, Taye T, MD  sodium chloride  1 g tablet Take 1 tablet (1 g total) by mouth 2 (two) times Grant with a meal. 12/27/23   Theadore Finger, MD     Family History  Problem Relation Age of Onset   Thyroid  disease Mother    Dementia Mother    Thyroid   disease Sister     Social History   Socioeconomic History   Marital status: Widowed    Spouse name: Not on file   Number of children: 3   Years of education: Not on file   Highest education level: Not on file  Occupational History   Not on file  Tobacco Use   Smoking status: Former    Current packs/day: 0.00    Average packs/day: 0.2 packs/day for 2.0 years (0.4 ttl pk-yrs)    Types: Cigarettes    Start date: 07/31/1992    Quit date: 07/31/1994    Years since quitting: 29.4   Smokeless tobacco: Never   Tobacco comments:    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  Vaping Use   Vaping status: Never Used  Substance and Sexual Activity   Alcohol  use: No   Drug use: No  Sexual activity: Not on file  Other Topics Concern   Not on file  Social History Narrative   12/28/21 lives alone   Social Drivers of Health   Financial Resource Strain: Not on file  Food Insecurity: No Food Insecurity (12/27/2023)   Hunger Vital Sign    Worried About Running Out of Food in the Last Year: Never true    Ran Out of Food in the Last Year: Never true  Transportation Needs: Patient Unable To Answer (12/27/2023)   PRAPARE - Transportation    Lack of Transportation (Medical): Patient unable to answer    Lack of Transportation (Non-Medical): Patient unable to answer  Physical Activity: Not on file  Stress: Not on file  Social Connections: Socially Isolated (12/27/2023)   Social Connection and Isolation Panel [NHANES]    Frequency of Communication with Friends and Family: Twice a week    Frequency of Social Gatherings with Friends and Family: More than three times a week    Attends Religious Services: Never    Database administrator or Organizations: No    Attends Banker Meetings: Never    Marital Status: Widowed     Review of Systems: A 12 point ROS discussed and pertinent positives are indicated in the HPI above.  All other systems are negative.  Review of Systems  Unable to perform ROS:  Mental status change    Vital Signs: BP (!) 142/65 (BP Location: Right Arm)   Pulse 92   Temp 99.3 F (37.4 C) (Oral)   Resp 18   Ht 5\' 6"  (1.676 m)   Wt 128 lb 8.5 oz (58.3 kg)   SpO2 96%   BMI 20.74 kg/m   Physical Exam Vitals and nursing note reviewed.  Constitutional:      Comments: Sleeping soundly, does not arouse  Cardiovascular:     Rate and Rhythm: Normal rate.  Pulmonary:     Effort: Pulmonary effort is normal. No respiratory distress.  Abdominal:     General: There is no distension.  Skin:    General: Skin is warm and dry.  Neurological:     Mental Status: She is disoriented.     Comments: Neuro deficits noted but not fully assessed due to somnolence today      MD Evaluation Airway: WNL Heart: WNL Abdomen: WNL Chest/ Lungs: WNL ASA  Classification: 3 Mallampati/Airway Score: Two   Imaging: VAS US  LOWER EXTREMITY VENOUS (DVT) Result Date: 12/30/2023  Lower Venous DVT Study Patient Name:  ABIGAYL HOR  Date of Exam:   12/29/2023 Medical Rec #: [id number]      Accession #:    [id number] Date of Birth: [birth year]      Patient Gender: F Patient Age:   54 years Exam Location:  [facility] Procedure:      VAS US  LOWER EXTREMITY VENOUS (DVT) Referring Phys: Georjean Kite --------------------------------------------------------------------------------  Indications: Status post unwitnessed fall in hospital after CVA resulting in right displaced femoral neck fracture and right total hip arthroplasty. Now in rehab unit.  Risk Factors: Immobility. Limitations: Pain with compression and rotation of legs and bandages at proximal thigh and groin. Comparison Study: No prior study on file Performing Technologist: Carleene Chase RVS  Examination Guidelines: A complete evaluation includes B-mode imaging, spectral Doppler, color Doppler, and power Doppler as needed of all accessible portions of each vessel. Bilateral testing is considered an integral part of a complete  examination. Limited examinations for reoccurring indications may be performed as  noted. The reflux portion of the exam is performed with the patient in reverse Trendelenburg.  +---------+---------------+---------+-----------+----------+-------------------+ RIGHT    CompressibilityPhasicitySpontaneityPropertiesThrombus Aging      +---------+---------------+---------+-----------+----------+-------------------+ CFV                     Yes      Yes                  patent by color and                                                       Doppler             +---------+---------------+---------+-----------+----------+-------------------+ SFJ                     Yes      Yes                  patent by color and                                                       Doppler             +---------+---------------+---------+-----------+----------+-------------------+ FV Prox                 Yes      Yes                  patent by color and                                                       Doppler             +---------+---------------+---------+-----------+----------+-------------------+ FV Mid   None           No       No                   Age Indeterminate   +---------+---------------+---------+-----------+----------+-------------------+ FV DistalNone           No       No                   Age Indeterminate   +---------+---------------+---------+-----------+----------+-------------------+ PFV                     Yes      Yes                  patent by color and                                                       Doppler             +---------+---------------+---------+-----------+----------+-------------------+ POP      Full  Yes      Yes                                      +---------+---------------+---------+-----------+----------+-------------------+ PTV      Full                                                              +---------+---------------+---------+-----------+----------+-------------------+ PERO     Full                                                             +---------+---------------+---------+-----------+----------+-------------------+   +---------+---------------+---------+-----------+----------+-------------------+ LEFT     CompressibilityPhasicitySpontaneityPropertiesThrombus Aging      +---------+---------------+---------+-----------+----------+-------------------+ CFV      Full           Yes      Yes                                      +---------+---------------+---------+-----------+----------+-------------------+ SFJ      Full                                                             +---------+---------------+---------+-----------+----------+-------------------+ FV Prox  Full           Yes      Yes                                      +---------+---------------+---------+-----------+----------+-------------------+ FV Mid   Full                                                             +---------+---------------+---------+-----------+----------+-------------------+ FV DistalFull           Yes      Yes                                      +---------+---------------+---------+-----------+----------+-------------------+ PFV      Full                                                             +---------+---------------+---------+-----------+----------+-------------------+ POP                     Yes  Yes                  patent by color and                                                       Doppler             +---------+---------------+---------+-----------+----------+-------------------+ PTV      Full                                                             +---------+---------------+---------+-----------+----------+-------------------+ PERO     Full                                                              +---------+---------------+---------+-----------+----------+-------------------+     Summary: RIGHT: - Findings consistent with age indeterminate deep vein thrombosis involving the Mid to distal femoral vein.   LEFT: - There is no evidence of deep vein thrombosis in the lower extremity.  *See table(s) above for measurements and observations. Electronically signed by Jimmye Moulds MD on 12/30/2023 at 11:35:52 AM.    Final    CT ANGIO GI BLEED Result Date: 12/25/2023 CLINICAL DATA:  Right red blood per rectum EXAM: CTA ABDOMEN AND PELVIS WITHOUT AND WITH CONTRAST TECHNIQUE: Multidetector CT imaging of the abdomen and pelvis was performed using the standard protocol during bolus administration of intravenous contrast. Multiplanar reconstructed images and MIPs were obtained and reviewed to evaluate the vascular anatomy. RADIATION DOSE REDUCTION: This exam was performed according to the departmental dose-optimization program which includes automated exposure control, adjustment of the mA and/or kV according to patient size and/or use of iterative reconstruction technique. CONTRAST:  OMNIPAQUE  IOHEXOL  350 MG/ML SOLN COMPARISON:  None Available. FINDINGS: VASCULAR Aorta: Normal caliber aorta without aneurysm, dissection, vasculitis or significant stenosis. Celiac: Patent without evidence of aneurysm, dissection, vasculitis or significant stenosis. SMA: Patent without evidence of aneurysm, dissection, vasculitis or significant stenosis. Renals: Both renal arteries are patent without evidence of aneurysm, dissection, vasculitis, fibromuscular dysplasia or significant stenosis. IMA: Patent without evidence of aneurysm, dissection, vasculitis or significant stenosis. Inflow: Patent without evidence of aneurysm, dissection, vasculitis or significant stenosis. Proximal Outflow: Bilateral common femoral and visualized portions of the superficial and profunda femoral arteries are patent without evidence of  aneurysm, dissection, vasculitis or significant stenosis. Veins: Negative Review of the MIP images confirms the above findings. NON-VASCULAR Lower chest: Small amount of right pleural fluid Hepatobiliary: No focal liver abnormality is seen. No gallstones, gallbladder wall thickening, or biliary dilatation. Pancreas: Unremarkable. No pancreatic ductal dilatation or surrounding inflammatory changes. Spleen: Normal in size without focal abnormality. Adrenals/Urinary Tract: Adrenal glands are unremarkable. Kidneys are normal, without renal calculi, focal lesion, or hydronephrosis. Bladder is unremarkable. Stomach/Bowel: Large stool ball in the rectum. Mild presacral and perirectal stranding. Rectosigmoid diverticulosis. No visible hemorrhage into the colon. No dilated small bowel. Lymphatic: No lymphadenopathy Reproductive: No adnexal mass or free  fluid in the pelvis. Other: Gas and edema within the soft tissues anterior to the right hip, likely postsurgical. Musculoskeletal: Recent right hip arthroplasty. No acute osseous findings. Bilateral L5 pars interarticularis defects IMPRESSION: 1. No visible hemorrhage into the colon. 2. Large stool ball in the rectum with mild presacral and perirectal stranding, which may indicate stercoral colitis. 3. Recent right hip arthroplasty with gas and edema within the soft tissues anterior to the right hip, likely postsurgical. 4. Small amount of right pleural fluid. Electronically Signed   By: Juanetta Nordmann M.D.   On: 12/25/2023 02:48   DG HIP UNILAT W OR W/O PELVIS 2-3 VIEWS RIGHT Result Date: 12/21/2023 CLINICAL DATA:  Postop EXAM: DG HIP (WITH OR WITHOUT PELVIS) 2-3V RIGHT COMPARISON:  12/20/2023 FINDINGS: Interval right hip replacement with normal alignment. Gas in the soft tissues consistent with recent surgery. IMPRESSION: Status post right hip replacement with expected postsurgical change. Electronically Signed   By: Esmeralda Hedge M.D.   On: 12/21/2023 18:09   DG HIP  UNILAT WITH PELVIS 1V RIGHT Result Date: 12/21/2023 CLINICAL DATA:  Elective surgery EXAM: DG HIP (WITH OR WITHOUT PELVIS) 1V RIGHT COMPARISON:  12/20/2023 FINDINGS: Multiple low resolution intraoperative spot views of the right hip. Total fluoroscopy time was 43.3 seconds, fluoroscopic dose of 4.6 mGy. Images were obtained during a right hip replacement. IMPRESSION: Intraoperative fluoroscopic assistance provided during right hip replacement. Electronically Signed   By: Esmeralda Hedge M.D.   On: 12/21/2023 18:09   DG C-Arm 1-60 Min-No Report Result Date: 12/21/2023 Fluoroscopy was utilized by the requesting physician.  No radiographic interpretation.   DG C-Arm 1-60 Min-No Report Result Date: 12/21/2023 Fluoroscopy was utilized by the requesting physician.  No radiographic interpretation.   CT HEAD WO CONTRAST ( ) Result Date: 12/20/2023 EXAM: CT HEAD WITHOUT 12/20/2023 11:59:13 AM TECHNIQUE: CT of the head was performed without the administration of intravenous contrast. Automated exposure control, iterative reconstruction, and/or weight based adjustment of the mA/kV was utilized to reduce the radiation dose to as low as reasonably achievable. COMPARISON: CT angiography of the head and neck and noncontrast head CT on 12/19/2023, and MR head without contrast on 12/18/2023. CLINICAL HISTORY: Head trauma, minor (Age >= 65y). Chief complaints; Altered Mental Status. FINDINGS: BRAIN AND VENTRICLES: There is no acute intracranial hemorrhage, mass effect or midline shift. No abnormal extra-axial fluid collection. The gray-white differentiation is maintained without evidence of an acute infarct. There is no evidence of hydrocephalus. Confluent periventricular and subcortical white matter disease is stable . ORBITS: The visualized portion of the orbits demonstrate no acute abnormality. SINUSES: The visualized paranasal sinuses and mastoid air cells demonstrate no acute abnormality. SOFT TISSUES AND SKULL: No  acute abnormality of the visualized skull or soft tissues. VASCULATURE: Atherosclerotic calcifications are present in the cavernous carotid arteries bilaterally. No hyperdense vessel is present. IMPRESSION: 1. No acute intracranial abnormality related to the minor head trauma and altered mental status. 2. Stable confluent periventricular and subcortical white matter disease. Electronically signed by: Audree Leas MD 12/20/2023 04:35 PM EDT RP Workstation: [id number]   DG FEMUR PORT, MIN 2 VIEWS RIGHT Result Date: 12/20/2023 CLINICAL DATA:  Fall.  Right hip pain. EXAM: RIGHT FEMUR PORTABLE 2 VIEW; DG HIP (WITH OR WITHOUT PELVIS) 2-3V RIGHT COMPARISON:  Pelvis and right hip radiographs 12/19/2023 FINDINGS: There is diffuse decreased bone mineralization. There is a new acute fracture of the mid right femoral neck with mild superior displacement of the distal fracture component and mild varus angulation.  The bilateral femoral heads remain normally located. Mild bilateral superolateral acetabular degenerative osteophytosis. Mild bilateral sacroiliac subchondral sclerosis. Normal alignment at the knee. Mild medial compartment of the knee joint space narrowing. IMPRESSION: Acute mildly displaced and angulated fracture of the mid right femoral neck. This was not present on yesterday's radiographs. Electronically Signed   By: Bertina Broccoli M.D.   On: 12/20/2023 12:12   DG HIP UNILAT WITH PELVIS 2-3 VIEWS RIGHT Result Date: 12/20/2023 CLINICAL DATA:  Fall.  Right hip pain. EXAM: RIGHT FEMUR PORTABLE 2 VIEW; DG HIP (WITH OR WITHOUT PELVIS) 2-3V RIGHT COMPARISON:  Pelvis and right hip radiographs 12/19/2023 FINDINGS: There is diffuse decreased bone mineralization. There is a new acute fracture of the mid right femoral neck with mild superior displacement of the distal fracture component and mild varus angulation. The bilateral femoral heads remain normally located. Mild bilateral superolateral acetabular  degenerative osteophytosis. Mild bilateral sacroiliac subchondral sclerosis. Normal alignment at the knee. Mild medial compartment of the knee joint space narrowing. IMPRESSION: Acute mildly displaced and angulated fracture of the mid right femoral neck. This was not present on yesterday's radiographs. Electronically Signed   By: Bertina Broccoli M.D.   On: 12/20/2023 12:12   DG HIP PORT UNILAT WITH PELVIS 1V RIGHT Result Date: 12/19/2023 CLINICAL DATA:  Right hip pain. EXAM: DG HIP (WITH OR WITHOUT PELVIS) 1V PORT RIGHT COMPARISON:  None Available. FINDINGS: Mildly decreased bone mineralization. Mild bilateral sacroiliac subchondral sclerosis. The bilateral femoroacetabular joint spaces are maintained. Mild superior pubic symphysis degenerative spurring. Moderate severe left L4-5 and moderate left L3-4 disc space narrowing. No acute fracture or dislocation. A vascular phlebolith overlies the left hemipelvis. IMPRESSION: 1. Mild bilateral sacroiliac osteoarthritis. 2. Moderate to severe left L4-5 and moderate left L3-4 degenerative disc and endplate changes. Electronically Signed   By: Bertina Broccoli M.D.   On: 12/19/2023 21:02   ECHOCARDIOGRAM COMPLETE Result Date: 12/19/2023    ECHOCARDIOGRAM REPORT   Patient Name:   SHINE SCROGHAM Date of Exam: 12/19/2023 Medical Rec #:  [id number]     Height:       66.0 in Accession #:    [id number]    Weight:       132.5 lb Date of Birth:  Jan 30, 1941     BSA:          1.679 m Patient Age:    82 years      BP:           171/67 mmHg Patient Gender: F             HR:           68 bpm. Exam Location:  Inpatient Procedure: 2D Echo, Cardiac Doppler and Color Doppler (Both Spectral and Color            Flow Doppler were utilized during procedure). Indications:    Stroke  History:        Patient has prior history of Echocardiogram examinations, most                 recent 11/21/2023.  Sonographer:    Janette Medley Referring Phys: CAROLE N HALL IMPRESSIONS  1. Left ventricular ejection  fraction, by estimation, is 65 to 70%. The left ventricle has normal function. The left ventricle has no regional wall motion abnormalities. Left ventricular diastolic parameters are consistent with Grade I diastolic dysfunction (impaired relaxation). Elevated left ventricular end-diastolic pressure.  2. Right ventricular systolic function is normal. The right ventricular  size is normal.  3. The mitral valve is normal in structure. No evidence of mitral valve regurgitation. No evidence of mitral stenosis.  4. The aortic valve is normal in structure. Aortic valve regurgitation is mild. No aortic stenosis is present. Echo findings are consistent with normal structure and function of the aortic valve prosthesis. Aortic valve area, by VTI measures 2.29 cm. Aortic valve mean gradient measures 5.0 mmHg. Aortic valve Vmax measures 1.48 m/s.  5. The inferior vena cava is normal in size with greater than 50% respiratory variability, suggesting right atrial pressure of 3 mmHg. FINDINGS  Left Ventricle: Left ventricular ejection fraction, by estimation, is 65 to 70%. The left ventricle has normal function. The left ventricle has no regional wall motion abnormalities. The left ventricular internal cavity size was normal in size. There is  no left ventricular hypertrophy. Left ventricular diastolic parameters are consistent with Grade I diastolic dysfunction (impaired relaxation). Elevated left ventricular end-diastolic pressure. Right Ventricle: The right ventricular size is normal. No increase in right ventricular wall thickness. Right ventricular systolic function is normal. Left Atrium: Left atrial size was normal in size. Right Atrium: Right atrial size was normal in size. Pericardium: There is no evidence of pericardial effusion. Mitral Valve: The mitral valve is normal in structure. No evidence of mitral valve regurgitation. No evidence of mitral valve stenosis. MV peak gradient, 7.5 mmHg. The mean mitral valve gradient  is 5.0 mmHg. Tricuspid Valve: The tricuspid valve is normal in structure. Tricuspid valve regurgitation is not demonstrated. No evidence of tricuspid stenosis. Aortic Valve: The aortic valve is normal in structure. Aortic valve regurgitation is mild. No aortic stenosis is present. Aortic valve mean gradient measures 5.0 mmHg. Aortic valve peak gradient measures 8.8 mmHg. Aortic valve area, by VTI measures 2.29 cm. 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 Echo findings are consistent with normal structure and function of the aortic valve prosthesis. Pulmonic Valve: The pulmonic valve was normal in structure. Pulmonic valve regurgitation is mild. No evidence of pulmonic stenosis. Aorta: The aortic root is normal in size and structure. Venous: The inferior vena cava is normal in size with greater than 50% respiratory variability, suggesting right atrial pressure of 3 mmHg. IAS/Shunts: No atrial level shunt detected by color flow Doppler.  LEFT VENTRICLE PLAX 2D LVIDd:         3.40 cm   Diastology LVIDs:         2.10 cm   LV e' medial:    7.83 cm/s LV PW:         1.00 cm   LV E/e' medial:  14.3 LV IVS:        1.10 cm   LV e' lateral:   8.16 cm/s LVOT diam:     1.70 cm   LV E/e' lateral: 13.7 LV SV:         83 LV SV Index:   49 LVOT Area:     2.27 cm  RIGHT VENTRICLE             IVC RV S prime:     13.70 cm/s  IVC diam: 1.30 cm TAPSE (M-mode): 2.4 cm LEFT ATRIUM             Index        RIGHT ATRIUM           Index LA diam:        3.60 cm 2.14 cm/m   RA  Area:     11.40 cm LA Vol (A2C):   22.4 ml 13.34 ml/m  RA Volume:   24.80 ml  14.77 ml/m LA Vol (A4C):   25.9 ml 15.43 ml/m LA Biplane Vol: 23.8 ml 14.18 ml/m  AORTIC VALVE AV Area (Vmax):    2.24 cm AV Area (Vmean):   2.34 cm AV Area (VTI):     2.29 cm AV Vmax:           148.50 cm/s AV Vmean:          98.900 cm/s AV VTI:            0.362 m AV Peak Grad:      8.8 mmHg AV Mean Grad:      5.0 mmHg LVOT Vmax:          146.50 cm/s LVOT Vmean:        102.000 cm/s LVOT VTI:          0.365 m LVOT/AV VTI ratio: 1.01  AORTA Ao Asc diam: 2.70 cm MITRAL VALVE MV Area (PHT): 3.03 cm     SHUNTS MV Area VTI:   1.85 cm     Systemic VTI:  0.36 m MV Peak grad:  7.5 mmHg     Systemic Diam: 1.70 cm MV Mean grad:  5.0 mmHg MV Vmax:       1.37 m/s MV Vmean:      106.0 cm/s MV Decel Time: 250 msec MV E velocity: 112.00 cm/s MV A velocity: 124.00 cm/s MV E/A ratio:  0.90 Maudine Sos MD Electronically signed by Maudine Sos MD Signature Date/Time: 12/19/2023/12:25:33 PM    Final    CT ANGIO HEAD NECK W WO CM Result Date: 12/19/2023 CLINICAL DATA:  Stroke, determine embolic source EXAM: CT ANGIOGRAPHY HEAD AND NECK WITH AND WITHOUT CONTRAST TECHNIQUE: Multidetector CT imaging of the head and neck was performed using the standard protocol during bolus administration of intravenous contrast. Multiplanar CT image reconstructions and MIPs were obtained to evaluate the vascular anatomy. Carotid stenosis measurements (when applicable) are obtained utilizing NASCET criteria, using the distal internal carotid diameter as the denominator. RADIATION DOSE REDUCTION: This exam was performed according to the departmental dose-optimization program which includes automated exposure control, adjustment of the mA and/or kV according to patient size and/or use of iterative reconstruction technique. CONTRAST:  75mL OMNIPAQUE  IOHEXOL  350 MG/ML SOLN COMPARISON:  Brain MRI from yesterday FINDINGS: CT HEAD FINDINGS Brain: The patient's acute infarct is occult by noncontrast CT. Cerebral volume loss and extensive chronic small vessel ischemia. No intracranial hemorrhage Vascular: No hyperdense vessel or unexpected calcification. 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 Review of the MIP images confirms the above findings CTA NECK FINDINGS Aortic arch: Extensive atheromatous plaque. 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 Right carotid system: No flow reducing stenosis or ulceration seen. Suboptimal opacification due to preferential venous timing. Left carotid system: Scattered atheromatous plaque, greatest calcification at the bifurcation. No stenosis or ulceration is seen. Vertebral arteries: No proximal subclavian stenosis. Left dominant vertebral artery. No evidence of flow reducing stenosis, beading, or dissection in the vertebral arteries - limited by contrast timing. Skeleton: Generalized cervical spine degeneration. Other neck: No acute finding.  Absent right submandibular gland Upper chest: Pleural based scarring with calcification at the apices. Review of the MIP images confirms the above findings CTA HEAD FINDINGS Anterior circulation: Extensive atheromatous calcification along the carotid siphons. No branch occlusion, beading, or aneurysm. Limited in  evaluating peripheral branches due to bolus quality. Posterior circulation: The vertebral and basilar arteries are sufficiently patent. Fetal type left PCA. No branch occlusion or evidence of proximal flow reducing stenosis. No evidence of aneurysm Venous sinuses: Diffusely patent Review of the MIP images confirms the above findings IMPRESSION: Suboptimal CTA due to bolus timing. No emergent finding. Atherosclerosis without flow reducing stenosis or irregularity involving major arteries in the head and neck. Electronically Signed   By: Ronnette Coke M.D.   On: 12/19/2023 08:36   MR BRAIN WO CONTRAST Result Date: 12/18/2023 CLINICAL DATA:  Initial evaluation for acute neuro deficit, stroke suspected. EXAM: MRI HEAD WITHOUT CONTRAST TECHNIQUE: Multiplanar, multiecho pulse sequences of the brain and surrounding structures were obtained without intravenous contrast. COMPARISON:  CT from earlier the same day. FINDINGS: Brain: Examination degraded by motion. Generalized age-related cerebral atrophy. Patchy T2/FLAIR hyperintensity involving the  supratentorial cerebral white matter and pons, consistent with chronic small vessel ischemic disease, moderately advanced in nature. Punctate focus of restricted diffusion seen involving the high left frontal lobe, consistent with a tiny acute ischemic nonhemorrhagic infarct (series 2, image 42). No other evidence for acute or subacute ischemia. No areas of chronic cortical infarction. No acute or significant chronic intracranial blood products. No mass lesion, midline shift or mass effect. No hydrocephalus or extra-axial fluid collection. Pituitary gland within normal limits. Vascular: Major intracranial vascular flow voids are maintained. Skull and upper cervical spine: Craniocervical junction within normal limits. Bone marrow signal intensity normal. No scalp soft tissue abnormality. Sinuses/Orbits: Prior bowel ocular lens replacement. Paranasal sinuses are largely clear. No significant mastoid effusion. Other: None. IMPRESSION: 1. 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 2. Underlying age-related cerebral atrophy with moderately advanced chronic microvascular ischemic disease. Electronically Signed   By: Virgia Griffins M.D.   On: 12/18/2023 20:33   CT HEAD WO CONTRAST Result Date: 12/18/2023 CLINICAL DATA:  Mental status change, unknown cause. EXAM: CT HEAD WITHOUT CONTRAST TECHNIQUE: Contiguous axial images were obtained from the base of the skull through the vertex without intravenous contrast. RADIATION DOSE REDUCTION: This exam was performed according to the departmental dose-optimization program which includes automated exposure control, adjustment of the mA and/or kV according to patient size and/or use of iterative reconstruction technique. COMPARISON:  MRI head from 01/15/2022. FINDINGS: Brain: No evidence of acute infarction, hemorrhage, hydrocephalus, extra-axial collection or mass lesion/mass effect. There is bilateral periventricular hypodensity,  which is non-specific but most likely seen in the settings of microvascular ischemic changes. Moderate in extent. otherwise normal appearance of brain parenchyma. Ventricles are prominent but cerebral volume is age appropriate. Vascular: No hyperdense vessel or unexpected calcification. Intracranial arteriosclerosis. Skull: Normal. Negative for fracture or focal lesion. Sinuses/Orbits: No acute finding. Hyperdense implant noted in the right orbit. Other: Visualized mastoid air cells are unremarkable. No mastoid effusion. IMPRESSION: *No acute intracranial abnormality. Electronically Signed   By: Beula Brunswick M.D.   On: 12/18/2023 16:10   DG Chest Port 1 View Result Date: 12/18/2023 CLINICAL DATA:  Weakness. EXAM: PORTABLE CHEST 1 VIEW COMPARISON:  11/16/2023. FINDINGS: Biapical pleural thickening noted. Bilateral lung fields are otherwise clear. No acute consolidation or lung collapse. Bilateral costophrenic angles are clear. Stable cardio-mediastinal silhouette. 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 No acute osseous abnormalities. The soft tissues are within normal limits. IMPRESSION: No active disease. Electronically Signed   By: Beula Brunswick M.D.   On: 12/18/2023 16:08    Labs:  CBC: Recent Labs    12/25/23  7829 12/26/23 0552 12/27/23 0526 12/28/23 0705 12/29/23 0456  WBC 8.1 9.0 8.4 8.8  --   HGB 11.1* 11.0* 11.2* 10.7* 10.8*  HCT 32.3* 32.1* 33.3* 31.9* 31.8*  PLT 276 310 390 421*  --     COAGS: Recent Labs    11/16/23 0930  INR 1.0    BMP: Recent Labs    12/26/23 0552 12/27/23 0526 12/28/23 0705 12/29/23 0456  NA 129* 134* 127* 130*  K 4.2 4.7 3.9 3.8  CL 100 102 97* 94*  CO2 25 26 23 23   GLUCOSE 109* 101* 101* 116*  BUN 13 15 17 22   CALCIUM  8.5* 9.3 8.8* 9.2  CREATININE 0.87 0.85 0.88 1.07*  GFRNONAA >60 >60 >60 52*    LIVER FUNCTION TESTS: Recent Labs    11/16/23 0930 12/18/23 1535 12/22/23 0743 12/26/23 0552 12/27/23 0526 12/28/23 0705  BILITOT 0.9 0.9 0.6   --   --  0.9  AST 34 28 31  --   --  38  ALT 26 17 14   --   --  36  ALKPHOS 67 51 38  --   --  93  PROT 7.1 6.4* 5.8*  --   --  5.7*  ALBUMIN  3.6 3.5 3.1* 2.5* 2.7* 2.6*    TUMOR MARKERS: No results for input(s): "AFPTM", "CEA", "CA199", "CHROMGRNA" in the last 8760 hours.  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  Given her recent GI bleed, team reluctant for additional anticoagulation.  IVC filter requested.   Case reviewed and approved by Dr. Julietta Ogren.   Patient assessed at bedside.  Spoke with daughter [name] who recommends discussion with POA Don Fritter.   Discussed with Don Fritter merits, risks, and benefits of IVC filter placement.  He is agreeable to proceed.   Risks and benefits discussed with the patient including, but not limited to bleeding, infection, contrast induced renal failure, filter fracture or migration which can lead to emergency surgery or even death, strut penetration with damage or irritation to adjacent structures and caval thrombosis.  All of the patient's questions were answered, patient is agreeable to proceed. Consent signed and in chart.  Thank you for this interesting consult.  I greatly enjoyed meeting [name] and look forward to participating in their care.  A copy of this report was sent to the requesting provider on this date.  Electronically Signed: Airiel Oblinger Sue-Ellen Clarisa Danser, PA 12/30/2023, 3:11 PM   I spent a total of 40 Minutes    in face to face in clinical consultation, greater than 50% of which was counseling/coordinating care for RLE DVT, rectal bleeding.

## 2023-12-31 ENCOUNTER — Other Ambulatory Visit (HOSPITAL_COMMUNITY)

## 2023-12-31 ENCOUNTER — Ambulatory Visit: Admitting: Physician Assistant

## 2023-12-31 ENCOUNTER — Inpatient Hospital Stay (HOSPITAL_COMMUNITY)

## 2023-12-31 DIAGNOSIS — I639 Cerebral infarction, unspecified: Secondary | ICD-10-CM | POA: Diagnosis not present

## 2023-12-31 HISTORY — PX: IR IVC FILTER PLMT / S&I /IMG GUID/MOD SED: IMG701

## 2023-12-31 LAB — BASIC METABOLIC PANEL WITH GFR
Anion gap: 9 (ref 5–15)
BUN: 20 mg/dL (ref 8–23)
CO2: 21 mmol/L — ABNORMAL LOW (ref 22–32)
Calcium: 8.5 mg/dL — ABNORMAL LOW (ref 8.9–10.3)
Chloride: 100 mmol/L (ref 98–111)
Creatinine, Ser: 0.82 mg/dL (ref 0.44–1.00)
GFR, Estimated: >60 mL/min (ref >60)
Glucose, Bld: 105 mg/dL — ABNORMAL HIGH (ref 70–99)
Potassium: 4 mmol/L (ref 3.5–5.1)
Sodium: 130 mmol/L — ABNORMAL LOW (ref 135–145)

## 2023-12-31 LAB — CBC
HCT: 33.6 % — ABNORMAL LOW (ref 36.0–46.0)
Hemoglobin: 10.9 g/dL — ABNORMAL LOW (ref 12.0–15.0)
MCH: 29.6 pg (ref 26.0–34.0)
MCHC: 32.4 g/dL (ref 30.0–36.0)
MCV: 91.3 fL (ref 80.0–100.0)
Platelets: 384 10*3/uL (ref 150–400)
RBC: 3.68 MIL/uL — ABNORMAL LOW (ref 3.87–5.11)
RDW: 13.5 % (ref 11.5–15.5)
WBC: 5.6 10*3/uL (ref 4.0–10.5)
nRBC: 0 % (ref 0.0–0.2)

## 2023-12-31 MED ORDER — IOHEXOL 300 MG/ML  SOLN
100.0000 mL | Freq: Once | INTRAMUSCULAR | Status: AC | PRN
  Administered 2023-12-31: 100 mL via INTRAVENOUS

## 2023-12-31 MED ORDER — MIDAZOLAM HCL 2 MG/2ML IJ SOLN
INTRAMUSCULAR | Status: AC
  Filled 2023-12-31: qty 2

## 2023-12-31 MED ORDER — LIDOCAINE HCL 1 % IJ SOLN
INTRAMUSCULAR | Status: AC
  Filled 2023-12-31: qty 20

## 2023-12-31 MED ORDER — IOHEXOL 300 MG/ML  SOLN
50.0000 mL | Freq: Once | INTRAMUSCULAR | Status: AC | PRN
  Administered 2023-12-31: 15 mL via INTRAVENOUS

## 2023-12-31 MED ORDER — FENTANYL CITRATE (PF) 100 MCG/2ML IJ SOLN
INTRAMUSCULAR | Status: AC
  Filled 2023-12-31: qty 2

## 2023-12-31 NOTE — Care Management (Signed)
 [facility]patient [facility] Individual Statement of Services  Patient Name:  [name]  Date:  12/31/2023  Welcome to the Inpatient Rehabilitation [facility].  Our goal is to provide you with an individualized program based on your diagnosis and situation, designed to meet your specific needs.  With this comprehensive rehabilitation program, you will be expected to participate in at least 3 hours of rehabilitation therapies Monday-Friday, with modified therapy programming on the weekends.  Your rehabilitation program will include the following services:  Physical Therapy (PT), Occupational Therapy (OT), Speech Therapy (ST), 24 hour per day rehabilitation nursing, Therapeutic Recreaction (TR), Psychology, Neuropsychology, Care Coordinator, Rehabilitation Medicine, Nutrition Services, Pharmacy Services, and Other  Weekly team conferences will be held on Wednesdays to discuss your progress.  Your Inpatient Rehabilitation Care Coordinator will talk with you frequently to get your input and to update you on team discussions.  Team conferences with you and your family in attendance may also be held.  Expected length of stay: 10-12 days    Overall anticipated outcome: Supervision  Depending on your progress and recovery, your program may change. Your Inpatient Rehabilitation Care Coordinator will coordinate services and will keep you informed of any changes. Your Inpatient Rehabilitation Care Coordinator's name and contact numbers are listed  below.  The following services may also be recommended but are not provided by the Inpatient Rehabilitation Center:  Driving Evaluations Home Health Rehabiltiation Services Outpatient Rehabilitation Services Vocational Rehabilitation   Arrangements will be made to provide these services after discharge if needed.  Arrangements include referral to agencies that provide these services.  Your insurance has been verified to be:  [employer]  Your primary  doctor is:  Imelda Man  Pertinent information will be shared with your doctor and your insurance company.  Inpatient Rehabilitation Care Coordinator:  Kathey Pang [phone] or ([phone]  Information discussed with and copy given to patient by: Rennis Case, 12/31/2023, 9:58 AM

## 2023-12-31 NOTE — Progress Notes (Signed)
 PT return to rehab from IR procedure. Vitals stable, sleepy but arousal with proper responses though she says she doesn't know where she is. Nurse reoriented her to unit. Pt is resting in bed at this time, all other needs met.

## 2023-12-31 NOTE — Progress Notes (Signed)
 Speech Language Pathology Daily Session Note  Patient Details  Name: [name] MRN: [id number] Date of Birth: [birth year]  Today's Date: 12/31/2023 SLP Individual Time: 1300-1335 SLP Individual Time Calculation (min): 35 min  Short Term Goals: Week 1: SLP Short Term Goal 1 (Week 1): Patient will utilize swallowing compensatory strategies during consumption of least restrictive diet given mod verbal A SLP Short Term Goal 2 (Week 1): Patient will demonstrate orientation to time, situation and location given max multimodal A SLP Short Term Goal 3 (Week 1): Patient will demonstrate problem solving abilities during basic daily situations given max multimodal A SLP Short Term Goal 4 (Week 1): Patient will demonstrate day to day recall of biographical information given max multimodal A SLP Short Term Goal 5 (Week 1): Patient will sustain attention for 3 minutes given max multimodal A  Skilled Therapeutic Interventions: 0800: SLP entered room to work with patient at planned tx time, however patient in IR for procedure. SLP will attempt to return as patient and therapist schedule allows.   1300: SLP returned in PM to complete tx session. SLP targeted cognitive goals. SLP facilitated session by providing total A for orientation tasks including location, situation and time. SLP created simple external aids to assist in orientation (posters w/ location, age, situation and month/year). Patient independently oriented to name and birthday, however unable to recall correct age (stating "49") SLP targeted problem solving through identification of call bell and naming reasons for use. Patient was able to identify reasons to utilize call bell with maxA. Of note, patient with very hoarse vocal quality this date and consistent throat clearing during session without PO intake. Patient left in chair with alarm set and call bell in reach. Continue POC.   A total of 25 minutes were missed this date due to patient  undergoing IR procedure.   Pain None reported   Therapy/Group: Individual Therapy  Nanie Dunkleberger M.A., CCC-SLP 12/31/2023, 7:47 AM

## 2023-12-31 NOTE — Progress Notes (Signed)
 Occupational Therapy Session Note  Patient Details  Name: [name] MRN: [id number] Date of Birth: 1941-04-21  Today's Date: 12/31/2023 OT Individual Time: 1130-1208 OT Individual Time Calculation (min): 38 min    Short Term Goals: Week 1:  OT Short Term Goal 1 (Week 1): Pt will be able to don pants with min A using AE as needed OT Short Term Goal 2 (Week 1): Pt will be able to self cleanse post toileting with min A. OT Short Term Goal 3 (Week 1): Pt will sit to stand with CGA at [place].  Skilled Therapeutic Interventions/Progress Updates:    Pt received in bed awake and asking many questions as she stated she was having difficulty with her memory and unsure where she was.  Verbally reviewed with her and wrote down reminder phrases that she was able to read back to me.   Pt very cooperative this session and followed directions well but is clearly distressed by her increased confusion.  Frequently reassured her and reoriented.  Pt moves slowly to EOB with mod A but then is able to stand up with CGA and use RW to pivot to w/c with CGA.    Pt sat at sink and smiled in mirror noticing how dirty her teeth were.  Pt worked on brushing teeth with good attention to task.   Pt cleansed teeth well, washed hands and face.   Helped pt to pick out new clothing to don in next OT session.  Pt declined need to toilet.  Resting in wc with belt alarm on and all needs met.   Therapy Documentation Precautions:  Precautions Precautions: Fall Precaution/Restrictions Comments: Alz/Dementia Restrictions Weight Bearing Restrictions Per Provider Order: Yes RLE Weight Bearing Per Provider Order: Weight bearing as tolerated    Vital Signs: Therapy Vitals Temp: 98.1 F (36.7 C) Temp Source: Oral Pulse Rate: 85 Resp: 17 BP: 114/67 Patient Position (if appropriate): Sitting Oxygen Therapy SpO2: 96 % O2 Device: Room Air Pain: Pain Assessment Pain Scale: 0-10 Pain Score: 0-No  pain ADL: ADL Eating: Set up Grooming: Minimal assistance Upper Body Bathing: Supervision/safety Lower Body Bathing: Moderate assistance Upper Body Dressing: Minimal assistance Lower Body Dressing: Maximal assistance Toileting: Moderate assistance Where Assessed-Toileting: Bedside Commode Toilet Transfer: Minimal assistance Toilet Transfer Method: Stand pivot Toilet Transfer Equipment: Bedside commode    Therapy/Group: Individual Therapy  Shira Bobst 12/31/2023, 12:54 PM

## 2023-12-31 NOTE — Procedures (Signed)
  Procedure:  IVCgram, IVC filter placement [place], retrievable Preprocedure diagnosis: The encounter diagnosis was Ischemic cerebrovascular accident (CVA) (HCC). Postprocedure diagnosis: same EBL:    minimal Complications:   none immediate  See full dictation in [employer].  Nicky Barrack MD Main # [phone] Pager  [phone] Mobile [phone]

## 2023-12-31 NOTE — Progress Notes (Addendum)
 Patient ID: [name], female   DOB: June 07, 1941, 83 y.o.   MRN: [id number]   1011- SW left message for pt dtr to introduce self, discuss discharge plan, and inform on ELOS. SW will follow-up with updates after team conference.   *SW received return phone call from pt dtr. She is unsure on what pt care needs will be and concerned if she can provide the care needed. She reports she intends to speak to her brother who is POA this evening.  [name], MSW, LCSW Office: [phone] Cell: [phone] Fax: [phone]

## 2023-12-31 NOTE — Progress Notes (Signed)
 Occupational Therapy Session Note  Patient Details  Name: [name] MRN: [id number] Date of Birth: 1940/11/14  Today's Date: 12/31/2023 OT Individual Time: 1350-1505 OT Individual Time Calculation (min): 75 min    Short Term Goals: Week 1:  OT Short Term Goal 1 (Week 1): Pt will be able to don pants with min A using AE as needed OT Short Term Goal 2 (Week 1): Pt will be able to self cleanse post toileting with min A. OT Short Term Goal 3 (Week 1): Pt will sit to stand with CGA at [facility].  Skilled Therapeutic Interventions/Progress Updates:  Pt greeted seated on BSC with NT present, pt agreeable to OT intervention.      Transfers/bed mobility/functional mobility:  Pt completed all sit>stands and stand pivot transfer with RW and MINA.   Therapeutic activity: pt completed standing tolerance task with pt instructed to reach out of BOS to mirror to match rings to squigz on mirror with a focus on dynamic reaching and standing tolerance for ADL participation. Pt completed task with CGA for balance and MIN verbal/visual cues for upright posture.    ADLs:  Grooming: pt able to brush her hair from w/c with set- up assist.  UB dressing:donned [place] shirt with MIN A LB dressing: donned pants and brief with MAX A   Transfers: stand pivot ADL transfers with RW and MINA Toileting: pt with continent urine void needing MODA for 3/3 toileting tasks. Pt able to access anterior periarea but requested assistance for cleanliness. Assist needed to manage brief.    Cognition: pt pleasantly confused throughout session often asking " how did I get like this." Or " I wish you knew me before this." Gently oriented pt to location, time and situation.                 Handed off pt directly to PT for next session.   Therapy Documentation Precautions:  Precautions Precautions: Fall Precaution/Restrictions Comments: Alz/Dementia Restrictions Weight Bearing Restrictions Per Provider Order: Yes RLE Weight  Bearing Per Provider Order: Weight bearing as tolerated  Pain: No pain    Therapy/Group: Individual Therapy  Willadean Hark 12/31/2023, 3:21 PM

## 2023-12-31 NOTE — Progress Notes (Signed)
 PROGRESS NOTE   Subjective/Complaints: Back from IVC filter placement for R fem v DVT  Pt did not receive sedation due to drowsiness  ROS: Limited due to cognition, however denies CP, SOB, abd pain, n/v/d.    Objective:   VAS US  LOWER EXTREMITY VENOUS (DVT) Result Date: 12/30/2023  Lower Venous DVT Study Patient Name:  [name]  Date of Exam:   12/29/2023 Medical Rec #: [id number]      Accession #:    [id number] Date of Birth: 11-Jan-1941      Patient Gender: F Patient Age:   83 years Exam Location:  [facility] Procedure:      VAS US  LOWER EXTREMITY VENOUS (DVT) Referring Phys: Georjean Kite --------------------------------------------------------------------------------  Indications: Status post unwitnessed fall in hospital after CVA resulting in right displaced femoral neck fracture and right total hip arthroplasty. Now in rehab unit.  Risk Factors: Immobility. Limitations: Pain with compression and rotation of legs and bandages at proximal thigh and groin. Comparison Study: No prior study on file Performing Technologist: Carleene Chase RVS  Examination Guidelines: A complete evaluation includes B-mode imaging, spectral Doppler, color Doppler, and power Doppler as needed of all accessible portions of each vessel. Bilateral testing is considered an integral part of a complete examination. Limited examinations for reoccurring indications may be performed as noted. The reflux portion of the exam is performed with the patient in reverse Trendelenburg.  +---------+---------------+---------+-----------+----------+-------------------+ RIGHT    CompressibilityPhasicitySpontaneityPropertiesThrombus Aging      +---------+---------------+---------+-----------+----------+-------------------+ CFV                     Yes      Yes                  patent by color and                                                       Doppler              +---------+---------------+---------+-----------+----------+-------------------+ SFJ                     Yes      Yes                  patent by color and                                                       Doppler             +---------+---------------+---------+-----------+----------+-------------------+ FV Prox                 Yes      Yes                  patent by color and  Doppler             +---------+---------------+---------+-----------+----------+-------------------+ FV Mid   None           No       No                   Age Indeterminate   +---------+---------------+---------+-----------+----------+-------------------+ FV DistalNone           No       No                   Age Indeterminate   +---------+---------------+---------+-----------+----------+-------------------+ PFV                     Yes      Yes                  patent by color and                                                       Doppler             +---------+---------------+---------+-----------+----------+-------------------+ POP      Full           Yes      Yes                                      +---------+---------------+---------+-----------+----------+-------------------+ PTV      Full                                                             +---------+---------------+---------+-----------+----------+-------------------+ PERO     Full                                                             +---------+---------------+---------+-----------+----------+-------------------+   +---------+---------------+---------+-----------+----------+-------------------+ LEFT     CompressibilityPhasicitySpontaneityPropertiesThrombus Aging      +---------+---------------+---------+-----------+----------+-------------------+ CFV      Full           Yes      Yes                                       +---------+---------------+---------+-----------+----------+-------------------+ SFJ      Full                                                             +---------+---------------+---------+-----------+----------+-------------------+ FV Prox  Full           Yes      Yes                                      +---------+---------------+---------+-----------+----------+-------------------+  FV Mid   Full                                                             +---------+---------------+---------+-----------+----------+-------------------+ FV DistalFull           Yes      Yes                                      +---------+---------------+---------+-----------+----------+-------------------+ PFV      Full                                                             +---------+---------------+---------+-----------+----------+-------------------+ POP                     Yes      Yes                  patent by color and                                                       Doppler             +---------+---------------+---------+-----------+----------+-------------------+ PTV      Full                                                             +---------+---------------+---------+-----------+----------+-------------------+ PERO     Full                                                             +---------+---------------+---------+-----------+----------+-------------------+     Summary: RIGHT: - Findings consistent with age indeterminate deep vein thrombosis involving the Mid to distal femoral vein.   LEFT: - There is no evidence of deep vein thrombosis in the lower extremity.  *See table(s) above for measurements and observations. Electronically signed by Jimmye Moulds MD on 12/30/2023 at 11:35:52 AM.    Final    Recent Labs    12/29/23 0456 12/31/23 0506  WBC  --  5.6  HGB 10.8* 10.9*  HCT 31.8* 33.6*  PLT  --  384   Recent Labs     12/29/23 0456 12/31/23 0506  NA 130* 130*  K 3.8 4.0  CL 94* 100  CO2 23 21*  GLUCOSE 116* 105*  BUN 22 20  CREATININE 1.07* 0.82  CALCIUM  9.2 8.5*    Intake/Output Summary (Last 24 hours) at 12/31/2023 0854 Last data filed at 12/31/2023 0800 Gross per 24  hour  Intake 180 ml  Output --  Net 180 ml        Physical Exam: Vital Signs Blood pressure 131/63, pulse 74, temperature 97.9 F (36.6 C), temperature source Oral, resp. rate (!) 24, height 5\' 6"  (1.676 m), weight 58.3 kg, SpO2 96%.  HEENT - mod swelling Right internal jugular area, mod tenderess around 2x2 dressing no blood on dressing  General: No acute distress Mood and affect are appropriate Heart: Regular rate and rhythm no rubs murmurs or extra sounds Lungs: Clear to auscultation, breathing unlabored, no rales or wheezes Abdomen: Positive bowel sounds, soft nontender to palpation, nondistended Extremities: No clubbing, cyanosis, or edema  Neuro: more awake and alert, oriented to self but not really to time/situation, unclear if oriented to place.    PRIOR EXAMS: Neuro:  Pt is oriented to self, hospital when given cues. Recalled that she injured her right hip. Follows basic commands. CN exam non-focal. MMT: RUE grossly 4 to 4+/5 prox to distal. LUE 4+ to 5/5. RLE not tested post IVC placement . Sensory exam normal for light touch and pain in all 4 limbs.  No abnormal tone appreciated.   Musculoskeletal: Right thigh with swelling, appropriately tender with palpation and attempts at ROM    Assessment/Plan: 1. Functional deficits which require 3+ hours per day of interdisciplinary therapy in a comprehensive inpatient rehab setting. Physiatrist is providing close team supervision and 24 hour management of active medical problems listed below. Physiatrist and rehab team continue to assess barriers to discharge/monitor patient progress toward functional and medical goals  Care Tool:  Bathing    Body parts bathed by  patient: Right arm, Left arm, Chest, Abdomen, Right upper leg, Left upper leg, Face, Front perineal area   Body parts bathed by helper: Buttocks, Left lower leg, Right lower leg     Bathing assist Assist Level: Moderate Assistance - Patient 50 - 74%     Upper Body Dressing/Undressing Upper body dressing   What is the patient wearing?: Pull over shirt    Upper body assist Assist Level: Minimal Assistance - Patient > 75%    Lower Body Dressing/Undressing Lower body dressing      What is the patient wearing?: Underwear/pull up, Pants     Lower body assist Assist for lower body dressing: Maximal Assistance - Patient 25 - 49%     Toileting Toileting    Toileting assist Assist for toileting: Moderate Assistance - Patient 50 - 74%     Transfers Chair/bed transfer  Transfers assist     Chair/bed transfer assist level: Minimal Assistance - Patient > 75%     Locomotion Ambulation   Ambulation assist      Assist level: Minimal Assistance - Patient > 75% Assistive device: Walker-rolling Max distance: 10 ft   Walk 10 feet activity   Assist     Assist level: Minimal Assistance - Patient > 75% Assistive device: Walker-rolling   Walk 50 feet activity   Assist Walk 50 feet with 2 turns activity did not occur: Safety/medical concerns         Walk 150 feet activity   Assist Walk 150 feet activity did not occur: Safety/medical concerns         Walk 10 feet on uneven surface  activity   Assist Walk 10 feet on uneven surfaces activity did not occur: Safety/medical concerns         Wheelchair     Assist Is the patient using a wheelchair?: Yes Type  of Wheelchair: Manual    Wheelchair assist level: Dependent - Patient 0% Max wheelchair distance: 15 ft    Wheelchair 50 feet with 2 turns activity    Assist    Wheelchair 50 feet with 2 turns activity did not occur: Safety/medical concerns       Wheelchair 150 feet activity      Assist  Wheelchair 150 feet activity did not occur: Safety/medical concerns       Blood pressure 131/63, pulse 74, temperature 97.9 F (36.6 C), temperature source Oral, resp. rate (!) 24, height 5\' 6"  (1.676 m), weight 58.3 kg, SpO2 96%.  Medical Problem List and Plan: 1. 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  Antithrombotics: -DVT/anticoagulation:  Mechanical: Antiembolism stockings, thigh (TED hose) Bilateral lower extremities -12/30/23 DVT U/S yesterday showing age indeterminate DVTs of RLE mid to distal femoral vein, IVC filter placed Dr Marlena Sima 6/2 Will keep HOB elevated 30 deg x 2 hr, may resume therapy this afternoon  -antiplatelet therapy: Aspirin  81 mg daily and Plavix  75 mg daily x 3 weeks then Plavix  alone.PLAVIX  HELD UNTIL 5/31 DUE TO RECTAL BLEEDING 3. Pain Management: Oxycodone  as needed 4. Mood/Behavior/Sleep/dementia: Namenda  5 mg nightly, melatonin 5 mg nightly as needed             -antipsychotic agents: N/A             -Continue Telesitter for poor safety awareness -12/29/23 pt refusing to open eyes or participate today; will see if she is more agreeable tomorrow, but may need to decrease sedating meds or if neurostimulating med would be of help -12/30/23 pt more awake today, will leave meds alone but be cautious with sedating meds-- not sure if med for initiation would be good.  5. Neuropsych/cognition: This patient is not capable of making decisions on her own behalf. 6. Skin/Wound Care: Routine skin checks 7. Fluids/Electrolytes/Nutrition: pt has reasonable appetite it appears but cognition might interfere with adequate follow  through during meals -12/29/23 inadequate intake of meals recently; continue to encourage -12/30/23 still very poor intake, discussed with nursing, will change diet order to include full assist/supervision so staff can aid/encourage 8.  Right displaced femoral neck fracture.  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 Curtiss Dowdy.  WBAT RLE with hip precautions 9.  Acute blood loss anemia/rectal bleeding.  Transfuse 1 unit packed red blood cells 5/26. 6/2 Hgb stable     Plavix  restarted 5/31    Latest Ref Rng & Units 12/31/2023    5:06 AM 12/29/2023    4:56 AM 12/28/2023    7:05 AM  CBC  WBC 4.0 - 10.5 K/uL 5.6   8.8   Hemoglobin 12.0 - 15.0 g/dL 60.4  54.0  98.1   Hematocrit 36.0 - 46.0 % 33.6  31.8  31.9   Platelets 150 - 400 K/uL 384   421      11.  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  Urine sodium 97,serum osmo 366 -Na+ stable  -tighten Fluid  restriction to 1200 cc.   - continue P.o. sodium chloride  1g BID    Latest Ref Rng & Units 12/31/2023    5:06 AM 12/29/2023    4:56 AM 12/28/2023    7:05 AM  BMP  Glucose 70 - 99 mg/dL 811  914  782   BUN 8 - 23 mg/dL 20  22  17    Creatinine 0.44 - 1.00 mg/dL 9.56  2.13  0.86   Sodium 135 - 145 mmol/L 130  130  127   Potassium 3.5 - 5.1 mmol/L 4.0  3.8  3.9   Chloride 98 - 111 mmol/L 100  94  97   CO2 22 - 32 mmol/L 21  23  23    Calcium  8.9 - 10.3 mg/dL 8.5  9.2  8.8     12.  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  Follow-up cardiology services 13.  History of oral cancer with tongue resection.  Follow-up outpatient [facility] 14.  Glaucoma.  Continue eyedrops as indicated 15.  Hypertension.  Toprol -XL 100 mg daily.  Norvasc  5mg  daily. Monitor with increased mobility  -5/31-6/1 BPs fine, monitor Vitals:   12/29/23 1958 12/30/23 0518 12/30/23 1421 12/30/23 1930  BP: (!) 125/56 130/65 (!) 142/65 (!) 150/61   12/31/23 0330 12/31/23 0821 12/31/23 0825 12/31/23 0830   BP: 133/67 (!) 145/63 138/61 131/65   12/31/23 0835 12/31/23 0840 12/31/23 0845 12/31/23 0849  BP: 123/71 129/75 (!) 119/59 131/63    16.  Hyperlipidemia.  Crestor  5mg  daily 17.  Hypothyroidism.  Synthroid  75mcg daily 18.  Constipation.  Senokot S1 tablet twice daily as well as Dulcolax suppository   -LBM 5/28 -12/29/23 still no BM documented since 5/28 though poor PO lately; will give sorbitol 30ml once -12/30/23 BMs 3x yesterday into this morning, monitor    LOS: 4 days A FACE TO FACE EVALUATION WAS PERFORMED  Genetta Kenning 12/31/2023, 8:54 AM

## 2023-12-31 NOTE — Progress Notes (Signed)
 Patient ID: [name], female   DOB: 29-Jun-1941, 83 y.o.   MRN: [id number] Patient just returned from IVC filter placement; very groggy. Met with the patient to review current medical situation, rehab process, team conference and plan of care.  Telesitter in place, 1500 cc Fluid Restriction on HH diet.  Hx of HTN, HLD on Crestor  and CAD.  Patient reports "I'm alive" as progress for today. Continue to follow to address educational needs to facilitate preparation for discharge. Naoma Bacca

## 2023-12-31 NOTE — Progress Notes (Signed)
 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 Abel Hoe & Dr. Honey Lusty   Scoliosis    Severe aortic stenosis    Wears glasses    Past Surgical History:  Past Surgical History:  Procedure Laterality Date   CESAREAN SECTION  07/31/1976   TWINS, ONE WAS STILLBORN   INTRAOPERATIVE TRANSTHORACIC ECHOCARDIOGRAM N/A 11/20/2023   Procedure: ECHOCARDIOGRAM, TRANSTHORACIC;  Surgeon: Odie Benne, MD;  Location: [facility] INVASIVE CV LAB;  Service: Cardiovascular;  Laterality: N/A;   IR IVC FILTER PLMT / S&I /IMG GUID/MOD SED  12/31/2023   MOUTH SURGERY  07/31/1994   tongue resection   NODULE REMOVED     BENIGN, LOWER NECK   RIGHT HEART CATH AND CORONARY ANGIOGRAPHY N/A 09/21/2023   Procedure: RIGHT HEART CATH AND CORONARY ANGIOGRAPHY;  Surgeon: Odie Benne, MD;  Location: [facility] INVASIVE CV LAB;  Service: Cardiovascular;  Laterality: N/A;   TOTAL HIP ARTHROPLASTY Right 12/21/2023   Procedure: ARTHROPLASTY, HIP, TOTAL, ANTERIOR APPROACH;  Surgeon: Laneta Pintos, MD;  Location: [facility] OR;  Service: Orthopedics;  Laterality: Right;  HEMI HIP   Social History:  reports that she quit smoking about 29 years ago. Her smoking use included cigarettes. She started smoking about 31 years ago. She has a 0.4 pack-year smoking history. She has never used smokeless tobacco. She reports  that she does not drink alcohol  and does not use drugs.  Family / Support Systems Marital Status: Widow/Widower Patient Roles: Parent Ability/Limitations of Caregiver: Plans for pt to dischagre to home with support from her dtr and hired caregivers. Caregiver Availability: 24/7 Family Dynamics: Pt lives with her daughter  Social History Preferred language: English Religion: Orthoptist - How often do you need to have someone help you when you read instructions, pamphlets, or other written material from your doctor or pharmacy?: Patient unable to respond Writes: Yes Employment Status: Retired   Abuse/Neglect Abuse/Neglect Assessment Can Be Completed: Unable to assess, patient is non-responsive or altered mental status Physical Abuse: Denies Verbal Abuse: Denies Sexual Abuse: Denies Exploitation of patient/patient's resources: Denies Self-Neglect: Denies  Patient response to: Social Isolation - How often do you feel lonely or isolated from those around you?: Patient unable to respond  Emotional Status    Patient / Family Perceptions, Expectations & Goals Pt/Family understanding of illness & functional limitations: Pt family has general understanding of care needs Premorbid pt/family roles/activities: Independent Anticipated changes in roles/activities/participation: Assistance with ADLs/IADLs  Recruitment consultant Agencies: None Premorbid Home Care/DME Agencies: None Transportation available at discharge: pt dtr Is the patient able to respond to transportation needs?: Yes In the past 12 months, has lack of transportation kept you from medical appointments or from getting medications?: No In the past 12 months, has lack of transportation kept you from meetings, work, or from getting  things needed for daily living?: No  Discharge Planning Living Arrangements: Children Support Systems: Children Type of Residence: Private residence Insurance Resources:  Media planner (specify) Financial Resources: Family Support Financial Screen Referred: No Living Expenses: Lives with family Money Management: Family Does the patient have any problems obtaining your medications?: No Care Coordinator Barriers to Discharge: Decreased caregiver support, Lack of/limited family support, Insurance for SNF coverage Care Coordinator Anticipated Follow Up Needs: HH/OP Expected length of stay: 10-12 days  Clinical Impression Chart audit completed for assessment.   Rennis Case 12/31/2023, 3:38 PM

## 2023-12-31 NOTE — Progress Notes (Signed)
 Physical Therapy Session Note  Patient Details  Name: [name] MRN: [id number] Date of Birth: 07/24/1941  Today's Date: 12/31/2023 PT Individual Time: 4098-1191 PT Individual Time Calculation (min): 34 min   Short Term Goals: Week 1:  PT Short Term Goal 1 (Week 1): Pt will perform bed mobility with increased speed of initiation and overall CGA PT Short Term Goal 2 (Week 1): Pt will perform standing transfers with CGA using LRAD. PT Short Term Goal 3 (Week 1): Pt will ambulate at least 50 ft using LRAD with CGA. PT Short Term Goal 4 (Week 1): Pt will initiate stair training. PT Short Term Goal 5 (Week 1): Pt will complete TUG for outcome measure.  Skilled Therapeutic Interventions/Progress Updates:    Pt recd as hand off from OT. Pt oriented to self and disoriented to time, situation/place. Receptive to orientation. Session focused on activity tolerance and dynamic balance.   Sit to stand with min a to CGA. Pt performed standing marches and step ups on 4" box with CGA overall for dynamic balance, strength, and single leg stance stability. Cueing for sequencing and recalling task.  Pt reports "soreness" in R hip, but managed with rest and positioning at this time.   Pt ambulated back to room and set up in w/c with needs in reach and alarm active. Pt's son arriving on therapist exit.   Therapy Documentation Precautions:  Precautions Precautions: Fall Precaution/Restrictions Comments: Alz/Dementia Restrictions Weight Bearing Restrictions Per Provider Order: Yes RLE Weight Bearing Per Provider Order: Weight bearing as tolerated General:     Therapy/Group: Individual Therapy  Tex Filbert 12/31/2023, 3:16 PM

## 2024-01-01 DIAGNOSIS — I639 Cerebral infarction, unspecified: Secondary | ICD-10-CM | POA: Diagnosis not present

## 2024-01-01 NOTE — Progress Notes (Addendum)
 Occupational Therapy Session Note  Patient Details  Name: [name] MRN: [id number] Date of Birth: Dec 16, 1940  Today's Date: 01/01/2024 OT Individual Time: 0715-0900 OT Individual Time Calculation (min): 105 min    Short Term Goals: Week 1:  OT Short Term Goal 1 (Week 1): Pt will be able to don pants with min A using AE as needed OT Short Term Goal 2 (Week 1): Pt will be able to self cleanse post toileting with min A. OT Short Term Goal 3 (Week 1): Pt will sit to stand with CGA at [facility].  Skilled Therapeutic Interventions/Progress Updates:    Pt participated in ADL re-training while participating in bathing at sink level. No complaint of pain when asked this morning. Confused and required increased VC for re-orientation.  Bed mobility: Pt required Min A in order to transition from supine to sitting EOB. VC provided for technique including using bed rails to transition. Physical assist provided to bring trunk up off HOB.   Functional transfers: Pt completed step pivot transfer from bed to [facility] with Min A while utilizing RW. VC provided hand placement during RW management prior to sit to stand.   UB bathing: Pt complete while seated requiring Set-up. VC provided to initiate task and for thoroughness.  LB bathing: Pt complete while seated then standing requiring Max A. VC provided for sequencing and direction.  UB dressing: Pt complete while seated requiring Min A.   LB dressing: Pt complete while seated then standing requiring Max A. VC provided for sequencing and direction.  Grooming: Pt complete while seated requiring Min A. Assist provided to brush hair. Pt provided with oral care supplies and able to complete task. Terminated task and verbalized that she was finished only when asked by OT. Toilet transfer: Using grab bar and BSC over toilet, pt completed toilet transfer with min A. VC for safety awareness and sequencing.   Toileting: Toileting completed with total A.   Pt  participated in standing balance/tolerance task utilizing Squigz. While standing with CGA and use of RW for balance, pt placed all Squigz on vertical surface. Pt then sat to remove Squigz to work on functional reaching and right shoulder stability and strength.   Communicated with nursing pt's need to have a BM although unsuccessful during session.   Therapy Documentation Precautions:  Precautions Precautions: Fall Precaution/Restrictions Comments: Alz/Dementia Restrictions Weight Bearing Restrictions Per Provider Order: Yes RLE Weight Bearing Per Provider Order: Weight bearing as tolerated  Therapy/Group: Individual Therapy  Carollee Circle, OTR/L,CBIS  Supplemental OT - [facility] Secure Chat Preferred   01/01/2024, 7:18 AM

## 2024-01-01 NOTE — Progress Notes (Signed)
 Occupational Therapy Session Note  Patient Details  Name: [name] MRN: [id number] Date of Birth: [birth year]  Today's Date: 01/01/2024 OT Individual Time: 5784-6962 OT Individual Time Calculation (min): 60 min    Short Term Goals: Week 1:  OT Short Term Goal 1 (Week 1): Pt will be able to don pants with min A using AE as needed OT Short Term Goal 2 (Week 1): Pt will be able to self cleanse post toileting with min A. OT Short Term Goal 3 (Week 1): Pt will sit to stand with CGA at [facility].  Skilled Therapeutic Interventions/Progress Updates:    Pt received in w/c ready for therapy.  Pt declined need to toilet. Asked pt if she would like to go outside to get fresh air.  Pt thought that was a good idea and wanted to put on her zip up jacket. Was able to do so with mod A.   Pt taken to outside courtyard patio via w/c with her RW in tow.  Once outside, pt placed in a shady area but stated the glare made it difficult to see and she usually uses dark sunglasses outside.  Positioned her to be out of glare as much as possible.  Pt initially did not want to try to walk as she felt she could not see well in the brightness. After talking about her family and giving time for her eyes to adjust, pt agreeable to working on standing up to RW.  Pt able to do so with close supervision and cues to push up with her hands. Pt then ambulated from w/c to a nearby bench about 10 ft away with light min A.    Pt sat on bench and discussed a favorite vacation she had to the [place]. She stated the breeze and flowers outside reminded her of the [place].   She stated she needed to toilet. Once back in the hospital she asked if she was still on [address] Reoriented pt .   Pt taken to room and pt stood from RW with supervision and ambulated with RW to bathroom with CGA.  Pt able to push pants down with min A.  Pt sat on toilet to void.  Session time over so NT came in to assist pt.  Pt's son arrived at end of session.     Therapy Documentation Precautions:  Precautions Precautions: Fall Precaution/Restrictions Comments: Alz/Dementia Restrictions Weight Bearing Restrictions Per Provider Order: Yes RLE Weight Bearing Per Provider Order: Weight bearing as tolerated Pain:  No c/o pain     Therapy/Group: Individual Therapy  Trinika Cortese 01/01/2024, 12:25 PM

## 2024-01-01 NOTE — Progress Notes (Signed)
 PROGRESS NOTE   Subjective/Complaints: Up with OT doing ADLs   ROS: Limited due to cognition, however denies CP, SOB, abd pain, n/v/d.    Objective:   IR IVC FILTER PLMT / S&I Dan Dun GUID/MOD SED Result Date: 12/31/2023 CLINICAL DATA:  Bilateral lower extremity DVT. GI bleed, a relative contraindication to anticoagulation. Caval filtration requested. EXAM: INFERIOR VENACAVOGRAM IVC FILTER PLACEMENT UNDER FLUOROSCOPY FLUOROSCOPY: Radiation Exposure Index (as provided by the fluoroscopic device): 22.1 mGy air Kerma TECHNIQUE: Patency of the right IJ vein was confirmed with ultrasound with image documentation. An appropriate skin site was determined. Skin site was marked, prepped with chlorhexidine , and draped using maximum barrier technique. The region was infiltrated locally with 1% lidocaine . Under real-time ultrasound guidance, the right IJ vein was accessed with a 21 gauge micropuncture needle; the needle tip within the vein was confirmed with ultrasound image documentation. The needle was exchanged over a 018 guidewire for a transitional dilator, which allow advancement of the [facility] wire into the IVC. A long 6 French vascular sheath was placed for inferior venacavography. This demonstrated no caval thrombus. Renal vein inflows were evident. The [facility] retrievable IVC filter was advanced through the sheath and successfully deployed under fluoroscopy at the L2-3 level. Followup cavagram demonstrates stable infrarenal filter position and no evident complication. The sheath was removed and hemostasis achieved at the site. No immediate complication. IMPRESSION: 1. Normal IVC. No thrombus or significant anatomic variation. 2. Technically successful infrarenal IVC filter placement. This is a retrievable model. PLAN: This IVC filter is potentially retrievable. The patient will be assessed for filter retrieval by Interventional Radiology in approximately  8-12 weeks. Further recommendations regarding filter retrieval, continued surveillance or declaration of device permanence, will be made at that time. Electronically Signed   By: Nicoletta Barrier M.D.   On: 12/31/2023 10:19   Recent Labs    12/31/23 0506  WBC 5.6  HGB 10.9*  HCT 33.6*  PLT 384   Recent Labs    12/31/23 0506  NA 130*  K 4.0  CL 100  CO2 21*  GLUCOSE 105*  BUN 20  CREATININE 0.82  CALCIUM  8.5*    Intake/Output Summary (Last 24 hours) at 01/01/2024 0752 Last data filed at 12/31/2023 1800 Gross per 24 hour  Intake 0 ml  Output --  Net 0 ml        Physical Exam: Vital Signs Blood pressure (!) 116/50, pulse 84, temperature 98.4 F (36.9 C), temperature source Oral, resp. rate 17, height 5\' 6"  (1.676 m), weight 58.3 kg, SpO2 97%.  HEENT - mod swelling Right internal jugular area, mod tenderess around 2x2 dressing no blood on dressing  General: No acute distress Mood and affect are appropriate Heart: Regular rate and rhythm no rubs murmurs or extra sounds Lungs: Clear to auscultation, breathing unlabored, no rales or wheezes Abdomen: Positive bowel sounds, soft nontender to palpation, nondistended Extremities: No clubbing, cyanosis, or edema  Neuro: more awake and alert, oriented to self but not really to time/situation, unclear if oriented to place.    PRIOR EXAMS: Neuro:  alert , speech with minimal dysarthria  Musculoskeletal: Right thigh with swelling, post op dressing intact  Assessment/Plan: 1. Functional deficits which require 3+ hours per day of interdisciplinary therapy in a comprehensive inpatient rehab setting. Physiatrist is providing close team supervision and 24 hour management of active medical problems listed below. Physiatrist and rehab team continue to assess barriers to discharge/monitor patient progress toward functional and medical goals  Care Tool:  Bathing    Body parts bathed by patient: Right arm, Left arm, Chest, Abdomen, Right  upper leg, Left upper leg, Face, Front perineal area   Body parts bathed by helper: Buttocks, Left lower leg, Right lower leg     Bathing assist Assist Level: Moderate Assistance - Patient 50 - 74%     Upper Body Dressing/Undressing Upper body dressing   What is the patient wearing?: Pull over shirt    Upper body assist Assist Level: Minimal Assistance - Patient > 75%    Lower Body Dressing/Undressing Lower body dressing      What is the patient wearing?: Underwear/pull up, Pants     Lower body assist Assist for lower body dressing: Maximal Assistance - Patient 25 - 49%     Toileting Toileting    Toileting assist Assist for toileting: Moderate Assistance - Patient 50 - 74%     Transfers Chair/bed transfer  Transfers assist     Chair/bed transfer assist level: Minimal Assistance - Patient > 75%     Locomotion Ambulation   Ambulation assist      Assist level: Minimal Assistance - Patient > 75% Assistive device: Walker-rolling Max distance: 90 ft   Walk 10 feet activity   Assist     Assist level: Contact Guard/Touching assist Assistive device: Walker-rolling   Walk 50 feet activity   Assist Walk 50 feet with 2 turns activity did not occur: Safety/medical concerns  Assist level: Contact Guard/Touching assist Assistive device: Walker-rolling    Walk 150 feet activity   Assist Walk 150 feet activity did not occur: Safety/medical concerns         Walk 10 feet on uneven surface  activity   Assist Walk 10 feet on uneven surfaces activity did not occur: Safety/medical concerns         Wheelchair     Assist Is the patient using a wheelchair?: Yes Type of Wheelchair: Manual    Wheelchair assist level: Dependent - Patient 0% Max wheelchair distance: 15 ft    Wheelchair 50 feet with 2 turns activity    Assist    Wheelchair 50 feet with 2 turns activity did not occur: Safety/medical concerns       Wheelchair 150 feet  activity     Assist  Wheelchair 150 feet activity did not occur: Safety/medical concerns       Blood pressure (!) 116/50, pulse 84, temperature 98.4 F (36.9 C), temperature source Oral, resp. rate 17, height 5\' 6"  (1.676 m), weight 58.3 kg, SpO2 97%.  Medical Problem List and Plan: 1. 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  Antithrombotics: -DVT/anticoagulation:  Mechanical: Antiembolism stockings, thigh (TED hose)  Bilateral lower extremities -12/30/23 DVT U/S yesterday showing age indeterminate DVTs of RLE mid to distal femoral vein, IVC filter placed Dr Marlena Sima 6/2 Will keep HOB elevated 30 deg x 2 hr, may resume therapy this afternoon  -antiplatelet therapy: Aspirin  81 mg daily and Plavix  75 mg daily x 3 weeks then Plavix  alone.PLAVIX  HELD UNTIL 5/31 DUE TO RECTAL BLEEDING 3. Pain Management: Oxycodone  as needed 4. Mood/Behavior/Sleep/dementia: Namenda  5 mg nightly, melatonin 5 mg nightly as needed             -antipsychotic agents: N/A             -Continue Telesitter for poor safety awareness -12/29/23 pt refusing to open eyes or participate today; will see if she is more agreeable tomorrow, but may need to decrease sedating meds or if neurostimulating med would be of help -12/30/23 pt more awake today, will leave meds alone but be cautious with sedating meds-- not sure if med for initiation would be good.  5. Neuropsych/cognition: This patient is not capable of making decisions on her own behalf. 6. Skin/Wound Care: Routine skin checks 7. Fluids/Electrolytes/Nutrition: pt has reasonable appetite it appears but cognition might interfere with adequate  follow through during meals -12/29/23 inadequate intake of meals recently; continue to encourage -12/30/23 still very poor intake, discussed with nursing, will change diet order to include full assist/supervision so staff can aid/encourage 8.  Right displaced femoral neck fracture.  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 Curtiss Dowdy.  WBAT RLE with hip precautions 9.  Acute blood loss anemia/rectal bleeding.  Transfuse 1 unit packed red blood cells 5/26. 6/2 Hgb stable     Plavix  restarted 5/31    Latest Ref Rng & Units 12/31/2023    5:06 AM 12/29/2023    4:56 AM 12/28/2023    7:05 AM  CBC  WBC 4.0 - 10.5 K/uL 5.6   8.8   Hemoglobin 12.0 - 15.0 g/dL 09.8  11.9  14.7   Hematocrit 36.0 - 46.0 % 33.6  31.8  31.9   Platelets 150 - 400 K/uL 384   421      11.  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  Urine sodium 97,serum osmo 366 -Na+ stable  -tighten Fluid restriction to 1200 cc.   - continue P.o. sodium chloride  1g BID    Latest Ref Rng & Units 12/31/2023    5:06 AM 12/29/2023    4:56 AM 12/28/2023    7:05 AM  BMP  Glucose 70 - 99 mg/dL 829  562  130   BUN 8 - 23 mg/dL 20  22  17    Creatinine 0.44 - 1.00 mg/dL 8.65  7.84  6.96   Sodium 135 - 145 mmol/L 130  130  127   Potassium 3.5 - 5.1 mmol/L 4.0  3.8  3.9   Chloride 98 - 111 mmol/L 100  94  97   CO2 22 - 32 mmol/L 21  23  23    Calcium  8.9 - 10.3 mg/dL 8.5  9.2  8.8     12.  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  Follow-up cardiology services 13.  History of oral cancer with tongue resection.  Follow-up outpatient [facility] 14.  Glaucoma.  Continue eyedrops as indicated 15.  Hypertension.  Toprol -XL 100 mg daily.  Norvasc  5mg  daily. Monitor with increased mobility  -5/31-6/1 BPs fine, monitor Vitals:   12/31/23 0330 12/31/23 0821 12/31/23 0825 12/31/23  0830  BP: 133/67 (!) 145/63 138/61 131/65   12/31/23 0835 12/31/23 0840 12/31/23 0845 12/31/23 0849   BP: 123/71 129/75 (!) 119/59 131/63   12/31/23 0934 12/31/23 1244 12/31/23 2057 01/01/24 0556  BP: (!) 140/70 114/67 (!) 129/51 (!) 116/50    16.  Hyperlipidemia.  Crestor  5mg  daily 17.  Hypothyroidism.  Synthroid  75mcg daily 18.  Constipation.  Senokot S1 tablet twice daily as well as Dulcolax suppository   -LBM 5/28 -12/29/23 still no BM documented since 5/28 though poor PO lately; will give sorbitol 30ml once -12/30/23 BMs 3x yesterday into this morning, monitor  Incont of stool 6/2  LOS: 5 days A FACE TO FACE EVALUATION WAS PERFORMED  Genetta Kenning 01/01/2024, 7:52 AM

## 2024-01-01 NOTE — Progress Notes (Signed)
 Speech Language Pathology Daily Session Note  Patient Details  Name: [name] MRN: [id number] Date of Birth: May 19, 1941  Today's Date: 01/01/2024 SLP Individual Time: 1430-1457 SLP Individual Time Calculation (min): 27 min  Short Term Goals: Week 1: SLP Short Term Goal 1 (Week 1): Patient will utilize swallowing compensatory strategies during consumption of least restrictive diet given mod verbal A SLP Short Term Goal 2 (Week 1): Patient will demonstrate orientation to time, situation and location given max multimodal A SLP Short Term Goal 3 (Week 1): Patient will demonstrate problem solving abilities during basic daily situations given max multimodal A SLP Short Term Goal 4 (Week 1): Patient will demonstrate day to day recall of biographical information given max multimodal A SLP Short Term Goal 5 (Week 1): Patient will sustain attention for 3 minutes given max multimodal A  Skilled Therapeutic Interventions: SLP conducted skilled therapy session targeting cognitive goals. Upon SLP entry, patient endorsing confusion re: location, situation, and environment. SLP spent session providing repetitive reeducation re: above mentioned items. Patient benefited from total assist and frequent reorientation to state location and situation as well as to recognize items in the room as her own. Patient was left in room with call bell in reach and alarm set. SLP will continue to target goals per plan of care.        Pain Pain Assessment Pain Scale: Faces Faces Pain Scale: No hurt  Therapy/Group: Individual Therapy  [name], M.A., CCC-SLP  [name] A [name] 01/01/2024, 2:59 PM

## 2024-01-02 DIAGNOSIS — I639 Cerebral infarction, unspecified: Secondary | ICD-10-CM | POA: Diagnosis not present

## 2024-01-02 NOTE — Plan of Care (Signed)
  Problem: Consults Goal: RH STROKE PATIENT EDUCATION Description: See Patient Education module for education specifics  Outcome: Progressing   Problem: RH BOWEL ELIMINATION Goal: RH STG MANAGE BOWEL WITH ASSISTANCE Description: STG Manage Bowel with toileting Assistance. Outcome: Progressing Goal: RH STG MANAGE BOWEL W/MEDICATION W/ASSISTANCE Description: STG Manage Bowel with Medication with mod I Assistance. Outcome: Progressing   Problem: RH SAFETY Goal: RH STG ADHERE TO SAFETY PRECAUTIONS W/ASSISTANCE/DEVICE Description: STG Adhere to Safety Precautions With cues Assistance/Device. Outcome: Progressing   Problem: RH COGNITION-NURSING Goal: RH STG USES MEMORY AIDS/STRATEGIES W/ASSIST TO PROBLEM SOLVE Description: STG Uses Memory Aids/Strategies With cues Assistance to Problem Solve. Outcome: Progressing Goal: RH STG ANTICIPATES NEEDS/CALLS FOR ASSIST W/ASSIST/CUES Description: STG Anticipates Needs/Calls for Assist With Assistance/Cues. Outcome: Progressing

## 2024-01-02 NOTE — Progress Notes (Signed)
 PROGRESS NOTE   Subjective/Complaints:  Working with SLP oriented to person, place (with cues)    ROS: Limited due to cognition, however denies CP, SOB, abd pain, n/v/d.    Objective:   IR IVC FILTER PLMT / S&I Dan Dun GUID/MOD SED Result Date: 12/31/2023 CLINICAL DATA:  Bilateral lower extremity DVT. GI bleed, a relative contraindication to anticoagulation. Caval filtration requested. EXAM: INFERIOR VENACAVOGRAM IVC FILTER PLACEMENT UNDER FLUOROSCOPY FLUOROSCOPY: Radiation Exposure Index (as provided by the fluoroscopic device): 22.1 mGy air Kerma TECHNIQUE: Patency of the right IJ vein was confirmed with ultrasound with image documentation. An appropriate skin site was determined. Skin site was marked, prepped with chlorhexidine , and draped using maximum barrier technique. The region was infiltrated locally with 1% lidocaine . Under real-time ultrasound guidance, the right IJ vein was accessed with a 21 gauge micropuncture needle; the needle tip within the vein was confirmed with ultrasound image documentation. The needle was exchanged over a 018 guidewire for a transitional dilator, which allow advancement of the [facility] wire into the IVC. A long 6 French vascular sheath was placed for inferior venacavography. This demonstrated no caval thrombus. Renal vein inflows were evident. The [facility] retrievable IVC filter was advanced through the sheath and successfully deployed under fluoroscopy at the L2-3 level. Followup cavagram demonstrates stable infrarenal filter position and no evident complication. The sheath was removed and hemostasis achieved at the site. No immediate complication. IMPRESSION: 1. Normal IVC. No thrombus or significant anatomic variation. 2. Technically successful infrarenal IVC filter placement. This is a retrievable model. PLAN: This IVC filter is potentially retrievable. The patient will be assessed for filter retrieval by  Interventional Radiology in approximately 8-12 weeks. Further recommendations regarding filter retrieval, continued surveillance or declaration of device permanence, will be made at that time. Electronically Signed   By: Nicoletta Barrier M.D.   On: 12/31/2023 10:19   Recent Labs    12/31/23 0506  WBC 5.6  HGB 10.9*  HCT 33.6*  PLT 384   Recent Labs    12/31/23 0506  NA 130*  K 4.0  CL 100  CO2 21*  GLUCOSE 105*  BUN 20  CREATININE 0.82  CALCIUM  8.5*    Intake/Output Summary (Last 24 hours) at 01/02/2024 0844 Last data filed at 01/01/2024 1834 Gross per 24 hour  Intake 536 ml  Output --  Net 536 ml        Physical Exam: Vital Signs Blood pressure 114/65, pulse 83, temperature 98 F (36.7 C), temperature source Oral, resp. rate 16, height 5\' 6"  (1.676 m), weight 58.3 kg, SpO2 97%.  HEENT - mod swelling Right internal jugular area, mild/mod tenderess around 2x2 dressing no blood on dressing  General: No acute distress Mood and affect are appropriate Heart: Regular rate and rhythm no rubs murmurs or extra sounds Lungs: Clear to auscultation, breathing unlabored, no rales or wheezes Abdomen: Positive bowel sounds, soft nontender to palpation, nondistended Extremities: No clubbing, cyanosis, or edema  Neuro:  alert , speech with minimal dysarthria  Musculoskeletal: Right thigh with swelling, post op dressing intact , no tenderness to palp  4-/5 BLE 4/5 BUE Tone is normal in UE   Assessment/Plan: 1. Functional  deficits which require 3+ hours per day of interdisciplinary therapy in a comprehensive inpatient rehab setting. Physiatrist is providing close team supervision and 24 hour management of active medical problems listed below. Physiatrist and rehab team continue to assess barriers to discharge/monitor patient progress toward functional and medical goals  Care Tool:  Bathing    Body parts bathed by patient: Right arm, Left arm, Chest, Abdomen, Right upper leg, Left  upper leg, Face, Front perineal area   Body parts bathed by helper: Buttocks, Left lower leg, Right lower leg     Bathing assist Assist Level: Moderate Assistance - Patient 50 - 74%     Upper Body Dressing/Undressing Upper body dressing   What is the patient wearing?: Pull over shirt    Upper body assist Assist Level: Minimal Assistance - Patient > 75%    Lower Body Dressing/Undressing Lower body dressing      What is the patient wearing?: Underwear/pull up, Pants     Lower body assist Assist for lower body dressing: Maximal Assistance - Patient 25 - 49%     Toileting Toileting    Toileting assist Assist for toileting: Minimal Assistance - Patient > 75%     Transfers Chair/bed transfer  Transfers assist     Chair/bed transfer assist level: Contact Guard/Touching assist     Locomotion Ambulation   Ambulation assist      Assist level: Minimal Assistance - Patient > 75% Assistive device: Walker-rolling Max distance: 90 ft   Walk 10 feet activity   Assist     Assist level: Contact Guard/Touching assist Assistive device: Walker-rolling   Walk 50 feet activity   Assist Walk 50 feet with 2 turns activity did not occur: Safety/medical concerns  Assist level: Contact Guard/Touching assist Assistive device: Walker-rolling    Walk 150 feet activity   Assist Walk 150 feet activity did not occur: Safety/medical concerns         Walk 10 feet on uneven surface  activity   Assist Walk 10 feet on uneven surfaces activity did not occur: Safety/medical concerns         Wheelchair     Assist Is the patient using a wheelchair?: Yes Type of Wheelchair: Manual    Wheelchair assist level: Dependent - Patient 0% Max wheelchair distance: 15 ft    Wheelchair 50 feet with 2 turns activity    Assist    Wheelchair 50 feet with 2 turns activity did not occur: Safety/medical concerns       Wheelchair 150 feet activity     Assist   Wheelchair 150 feet activity did not occur: Safety/medical concerns       Blood pressure 114/65, pulse 83, temperature 98 F (36.7 C), temperature source Oral, resp. rate 16, height 5\' 6"  (1.676 m), weight 58.3 kg, SpO2 97%.  Medical Problem List and Plan: 1. 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  Antithrombotics: -DVT/anticoagulation:  Mechanical: Antiembolism stockings, thigh (TED hose) Bilateral lower extremities -12/30/23 DVT U/S yesterday showing  age indeterminate DVTs of RLE mid to distal femoral vein, IVC filter placed Dr Marlena Sima 6/2 Will keep HOB elevated 30 deg x 2 hr, may resume therapy this afternoon  -antiplatelet therapy: Aspirin  81 mg daily and Plavix  75 mg daily x 3 weeks then Plavix  alone.PLAVIX  HELD UNTIL 5/31 DUE TO RECTAL BLEEDING 3. Pain Management: Oxycodone  as needed 4. Mood/Behavior/Sleep/dementia: Namenda  5 mg nightly, melatonin 5 mg nightly as needed             -antipsychotic agents: N/A             -Continue Telesitter for poor safety awareness -12/29/23 pt refusing to open eyes or participate today; will see if she is more agreeable tomorrow, but may need to decrease sedating meds or if neurostimulating med would be of help -12/30/23 pt more awake today, will leave meds alone but be cautious with sedating meds-- not sure if med for initiation would be good.  5. Neuropsych/cognition: This patient is not capable of making decisions on her own behalf. 6. Skin/Wound Care: Routine skin checks 7. Fluids/Electrolytes/Nutrition: pt has reasonable appetite it appears but cognition might interfere with adequate follow through during  meals -12/29/23 inadequate intake of meals recently; continue to encourage -12/30/23 still very poor intake, discussed with nursing, will change diet order to include full assist/supervision so staff can aid/encourage 8.  Right displaced femoral neck fracture.  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 Curtiss Dowdy.  WBAT RLE with hip precautions 9.  Acute blood loss anemia/rectal bleeding.  Transfuse 1 unit packed red blood cells 5/26. 6/2 Hgb stable     Plavix  restarted 5/31    Latest Ref Rng & Units 12/31/2023    5:06 AM 12/29/2023    4:56 AM 12/28/2023    7:05 AM  CBC  WBC 4.0 - 10.5 K/uL 5.6   8.8   Hemoglobin 12.0 - 15.0 g/dL 16.1  09.6  04.5   Hematocrit 36.0 - 46.0 % 33.6  31.8  31.9   Platelets 150 - 400 K/uL 384   421      11.  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  Urine sodium 97,serum osmo 366 -Na+ stable  -tighten Fluid restriction to 1200 cc.   - continue P.o. sodium chloride  1g BID    Latest Ref Rng & Units 12/31/2023    5:06 AM 12/29/2023    4:56 AM 12/28/2023    7:05 AM  BMP  Glucose 70 - 99 mg/dL 409  811  914   BUN 8 - 23 mg/dL 20  22  17    Creatinine 0.44 - 1.00 mg/dL 7.82  9.56  2.13   Sodium 135 - 145 mmol/L 130  130  127   Potassium 3.5 - 5.1 mmol/L 4.0  3.8  3.9   Chloride 98 - 111 mmol/L 100  94  97   CO2 22 - 32 mmol/L 21  23  23    Calcium  8.9 - 10.3 mg/dL 8.5  9.2  8.8     12.  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  Follow-up cardiology services 13.  History of oral cancer with tongue resection.  Follow-up outpatient [facility] 14.  Glaucoma.  Continue eyedrops as indicated 15.  Hypertension.  Toprol -XL 100 mg daily.  Norvasc  5mg  daily. Monitor with increased mobility  -5/31-6/1 BPs fine, monitor Vitals:   12/31/23 0830 12/31/23 0835 12/31/23 0840 12/31/23 0845  BP: 131/65 123/71 129/75 (!) 119/59  12/31/23 0849 12/31/23 0934 12/31/23 1244 12/31/23 2057  BP: 131/63 (!) 140/70  114/67 (!) 129/51   01/01/24 0556 01/01/24 1428 01/01/24 1954 01/02/24 0504  BP: (!) 116/50 (!) 118/58 126/64 114/65    16.  Hyperlipidemia.  Crestor  5mg  daily 17.  Hypothyroidism.  Synthroid  75mcg daily 18.  Constipation.  Senokot S1 tablet twice daily as well as Dulcolax suppository   -LBM 5/28 -12/29/23 still no BM documented since 5/28 though poor PO lately; will give sorbitol 30ml once -12/30/23 BMs 3x yesterday into this morning, monitor  Incont of stool 6/2  LOS: 6 days A FACE TO FACE EVALUATION WAS PERFORMED  Genetta Kenning 01/02/2024, 8:44 AM

## 2024-01-02 NOTE — Progress Notes (Addendum)
 Patient ID: [name], female   DOB: 1941-05-17, 83 y.o.   MRN: [id number]  SW went by pt room, and no family in room.   1431- SW left message for pt dtr [name] to give updates from team conference, and d/c date 6/10, DME- RW, w/c, TTB, 3in1 BSC, and HHPT/OT/SLP; requested return phone call to discus discharge plan.   *SW received phone call from pt dtr [name]. SW called pt son Sylvester Evert and conference call to discuss above. Sw will leave HHA list in room for their review. They are working on a home care agency. SW will order all DME, and they have been advised that a RW and TTB is not covered. They understand they are willing to cancel any items they do not want. They will confirm family edu with SW- either Friday or Monday.  [name], MSW, LCSW Office: [phone] Cell: [phone] Fax: [phone]

## 2024-01-02 NOTE — Progress Notes (Signed)
 Physical Therapy Session Note  Patient Details  Name: [name] MRN: [id number] Date of Birth: 1941-05-12  Today's Date: 01/02/2024 PT Individual Time: 1109-1204 PT Individual Time Calculation (min): 55 min   Short Term Goals: Week 1:  PT Short Term Goal 1 (Week 1): Pt will perform bed mobility with increased speed of initiation and overall CGA PT Short Term Goal 2 (Week 1): Pt will perform standing transfers with CGA using LRAD. PT Short Term Goal 3 (Week 1): Pt will ambulate at least 50 ft using LRAD with CGA. PT Short Term Goal 4 (Week 1): Pt will initiate stair training. PT Short Term Goal 5 (Week 1): Pt will complete TUG for outcome measure.  Skilled Therapeutic Interventions/Progress Updates: Patient sitting in [facility] with son present on entrance to room. Patient alert and agreeable to PT session.   Patient reported some soreness in R proximal thigh (rest provided as needed during session) from increased activity. PTA, son and pt discussed pt's progress at end of session, and increased alertness during the morning/early afternoon. Pt son educated throughout session on safe transfers/ambulation with cues on hand placement if needed, and to let pt do as much as pt can in order to increase pt's functional independence.   Therapeutic Activity: Transfers: Pt performed sit<>stand transfers throughout session with CGA/light minA. Provided VC for hand placement when using RW, and to pivot fully to sitting surface until B back of knees touch edge.  - Tub bench transfer from [facility] with CGA and very light minA to assist with advancing LE over tub (use of grab bar) with cues to maintain safe awareness to sitting placement on bench to avoid sliding off forward. - TUG (105.36s) in RW with overall CGA/light minA for safety with pt downward gaze, forward flexed, decreased safety with RW proximity and having to have cues to recall sequence of events.  - Pt ambulated 102' with RW and CGA and VC to maintain  safe proximity to RW (especially with turns as pt will gravitate to L of RW outside/behind L leg) and to look forward ahead vs downward gaze. Pt with improved cadence vs first time with this PTA the day after evaluation.  - Pt participated in sit<>stands without AD and VC to increase anterior shift ("nose over toes") and to press toes into ground.   Patient sitting in WC at end of session with brakes locked, son present, belt alarm set, and all needs within reach.      Therapy Documentation Precautions:  Precautions Precautions: Fall Precaution/Restrictions Comments: Alz/Dementia Restrictions Weight Bearing Restrictions Per Provider Order: Yes RLE Weight Bearing Per Provider Order: Weight bearing as tolerated  Therapy/Group: Individual Therapy  Shaheed Schmuck PTA 01/02/2024, 12:23 PM

## 2024-01-02 NOTE — Progress Notes (Signed)
 Speech Language Pathology Daily Session Note  Patient Details  Name: [name] MRN: [id number] Date of Birth: 12-23-1940  Today's Date: 01/02/2024 SLP Individual Time: 0800-0900 SLP Individual Time Calculation (min): 60 min  Short Term Goals: Week 1: SLP Short Term Goal 1 (Week 1): Patient will utilize swallowing compensatory strategies during consumption of least restrictive diet given mod verbal A SLP Short Term Goal 2 (Week 1): Patient will demonstrate orientation to time, situation and location given max multimodal A SLP Short Term Goal 3 (Week 1): Patient will demonstrate problem solving abilities during basic daily situations given max multimodal A SLP Short Term Goal 4 (Week 1): Patient will demonstrate day to day recall of biographical information given max multimodal A SLP Short Term Goal 5 (Week 1): Patient will sustain attention for 3 minutes given max multimodal A  Skilled Therapeutic Interventions: Skilled therapy session focused on cognitive and dysphagia goals. SLP observed patient with consumption of regular/thin diet. Patient with prolonged mastication and complete oral clearance. Patient with throat clear and delayed cough during consumption of sausage, however reporting it is "too spicy." No s/sx of aspiration during thin liquids via cup edge during breakfast, however patient with coughing episode after pills in water.  Recommend D3/thin diet with medications whole in puree. Patient should receive full supervision for use of compensatory strategies. Recommend MBS to assess oropharyngeal swallowing mechanism.  SLP targeted cognitive goals through orientation task. Patient aware she is in a hospital this date, however unsure which one. Patient required total A for awareness of situation (broken hip, stroke), however independently recalled age and DOB. SLP provided modA for patient to utilize calendar for date/month/year. Patient left in bed with alarm set and call bell in reach.  Continue POC.  Pain None reported   Therapy/Group: Individual Therapy  Gaetano Romberger M.A., CCC-SLP 01/02/2024, 7:53 AM

## 2024-01-02 NOTE — Patient Care Conference (Signed)
 Inpatient RehabilitationTeam Conference and Plan of Care Update Date: 01/02/2024   Time: 10:11 AM    Patient Name: [name]      Medical Record Number: [id number]  Date of Birth: Dec 28, 1940 Sex: Female         Room/Bed: 4W14C/4W14C-01 Payor Info: Payor: [employer] MEDICARE / Plan: Raina Bunting MEDICARE HMO/PPO / Product Type: *No Product type* /    Admit Date/Time:  12/27/2023  3:44 PM  Primary Diagnosis:  Ischemic cerebrovascular accident (CVA) [facility])  Hospital Problems: Principal Problem:   Ischemic cerebrovascular accident (CVA) [facility])    Expected Discharge Date: Expected Discharge Date: 01/08/24  Team Members Present: Physician leading conference: Dr. Janeece Mechanic Social Worker Present: [name], LCSW Nurse Present: Forrestine Ike, RN PT Present: Oma Bias, PT;Dominic Randa Burton, PTA OT Present: Kenda Paula, OT SLP Present: Reggie Caper, SLP     Current Status/Progress Goal Weekly Team Focus  Bowel/Bladder      Incontinent of bowel and bladder    Manage incontinence with min assist    Toileting protocol  Swallow/Nutrition/ Hydration   D3/thin   minA  MBS, tolerance of diet    ADL's   min A UB self care and transfers,  max A LB self care,  needs time to process and complete tasks, min A ambulation with RW   supervision overall (may need to down grade LB self care)   ADL training, functional mobility, pt/fam education    Mobility   bed mobility = min/modA; transfers; minA; Ambulation CGA. Challenging to stay on task/initiate movement with max cuing   Supervision/CGA  Barriers; Dementia, therapy tolerance; Focus - pt/family education, ambulatory tolerance, transfers, bed mobility standing balance    Communication                Safety/Cognition/ Behavioral Observations  total assist for orientation, problem solving, memory/recall   mod assist   orientation aids consistency, recall of orientation items    Pain      N/A          Skin       N/A           Discharge Planning:  D/c location in pending based on care needs at dischrge. SW will confirm there are no barriers to discharge.   Team Discussion: Patient post CVA with IVC filter placement, poor initiation and dysphagia, complicated by rectal bleeding and fall with hip fracture; s/p THA 12/21/23.  Patient on target to meet rehab goals: yes, currently needs CGA for sit - stand transfers, stand pivot transfers with supervision.  Needs assistance for lower body self care but able to ambulate up to 20' with CGA.  *See Care Plan and progress notes for long and short-term goals.   Revisions to Treatment Plan:  Changed to 15/7 therapy schedule   Teaching Needs: Safety, transfers, toileting, medications, skin care, etc.  Current Barriers to Discharge: Decreased caregiver support and Home enviroment access/layout  Possible Resolutions to Barriers: Family education HH OT follow up DME: RW, W/C, BSC and TTB     Medical Summary Current Status: poor tolerance of activity , R thigh DVT, surgical incision healing well  Barriers to Discharge: Other (comments)  Barriers to Discharge Comments: post op Right THA Possible Resolutions to Barriers/Weekly Focus: 15/7 therapy schedule   Continued Need for Acute Rehabilitation Level of Care: The patient requires daily medical management by a physician with specialized training in physical medicine and rehabilitation for the following reasons: Direction of a multidisciplinary physical rehabilitation  program to maximize functional independence : Yes Medical management of patient stability for increased activity during participation in an intensive rehabilitation regime.: Yes Analysis of laboratory values and/or radiology reports with any subsequent need for medication adjustment and/or medical intervention. : Yes   I attest that I was present, lead the team conference, and concur with the assessment and plan of the  team.   Forrestine Ike B 01/02/2024, 1:11 PM

## 2024-01-02 NOTE — Progress Notes (Signed)
 Occupational Therapy Session Note  Patient Details  Name: [name] MRN: [id number] Date of Birth: 1940/08/15  Today's Date: 01/02/2024 OT Individual Time: 0935-1003 OT Individual Time Calculation (min): 28 min    Short Term Goals: Week 1:  OT Short Term Goal 1 (Week 1): Pt will be able to don pants with min A using AE as needed OT Short Term Goal 2 (Week 1): Pt will be able to self cleanse post toileting with min A. OT Short Term Goal 3 (Week 1): Pt will sit to stand with CGA at [facility].  Skilled Therapeutic Interventions/Progress Updates:     Pt received in bed ready for therapy.  Pt's son Sylvester Evert present. Focus of therapy session on general mobility skills.  Donned her TED hose and shoes for pt. Denied need to toilet.       Transfers: -at home,  pt gets out of bed on her L side,  pt followed cues with tactile cues to bend knees, roll to L using bed rail for support with min A to roll -cues to push up to sit with L arm with guiding assist -suggested to son to purchase a bed rail on [employer] -cued pt to scoot hips forward to prepare to stand and pt unsure what I meant, but with a visual demonstration she understood and moved her hips forward easily. -sit to stand from low bed with CGA using B hands to push up to stand -ambulated with RW with CGA around bed to Wc on other side of bed using good pace and no cuing needed on how to rotate with RW  -ambulated approximately 20 ft  - pt cued to reach back for w/c which she did to descend softly into seat.   Balance: -standing with RW close supervision   Discussed options for DME. Pt will need a BSC and a tub bench as she has a tub at home. Have not had a chance to try tub transfers, but will ask PT to try during their session.    Pt resting in w/c with all needs met. Alarm set and call light in reach.     Therapy Documentation Precautions:  Precautions Precautions: Fall Precaution/Restrictions Comments:  Alz/Dementia Restrictions Weight Bearing Restrictions Per Provider Order: Yes RLE Weight Bearing Per Provider Order: Weight bearing as tolerated   Pain: Pain Assessment Pain Scale: 0-10 Pain Score: 0-No pain   Therapy/Group: Individual Therapy  Sayana Salley 01/02/2024, 10:23 AM

## 2024-01-02 NOTE — Plan of Care (Signed)
 Pt alert and oriented to self. Sitting in the wheel chair. Family member visiting. Pt is confused to place. Denies pain. Skin warm and dry. Tolerated meds crushed and placed in apple sauce. Slight redness noted to her lt eye. Eye gtt administered. No acute distress noted. Encouraged to call for assistance with adls. Call light in reach. Tele sitter in use. Sr x3 elevated. Bed in low position.

## 2024-01-02 NOTE — Plan of Care (Signed)
  Problem: Consults Goal: RH STROKE PATIENT EDUCATION Description: See Patient Education module for education specifics  Outcome: Progressing   Problem: RH BOWEL ELIMINATION Goal: RH STG MANAGE BOWEL WITH ASSISTANCE Description: STG Manage Bowel with toileting Assistance. Outcome: Progressing Goal: RH STG MANAGE BOWEL W/MEDICATION W/ASSISTANCE Description: STG Manage Bowel with Medication with mod I Assistance. Outcome: Progressing   Problem: RH SAFETY Goal: RH STG ADHERE TO SAFETY PRECAUTIONS W/ASSISTANCE/DEVICE Description: STG Adhere to Safety Precautions With cues Assistance/Device. Outcome: Progressing   Problem: RH COGNITION-NURSING Goal: RH STG USES MEMORY AIDS/STRATEGIES W/ASSIST TO PROBLEM SOLVE Description: STG Uses Memory Aids/Strategies With cues Assistance to Problem Solve. Outcome: Progressing Goal: RH STG ANTICIPATES NEEDS/CALLS FOR ASSIST W/ASSIST/CUES Description: STG Anticipates Needs/Calls for Assist With Assistance/Cues. Outcome: Progressing   Problem: RH PAIN MANAGEMENT Goal: RH STG PAIN MANAGED AT OR BELOW PT'S PAIN GOAL Description: Pain < 4 with prns Outcome: Progressing   Problem: RH KNOWLEDGE DEFICIT Goal: RH STG INCREASE KNOWLEDGE OF HYPERTENSION Description: Patient's dtr will be able to manage HTN using educational resources for medication and dietary modification independently Outcome: Progressing Goal: RH STG INCREASE KNOWLEGDE OF HYPERLIPIDEMIA Description: Patient's dtr will be able to manage HLD using educational resources for medication and dietary modification independently Outcome: Progressing Goal: RH STG 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 Description: Patient's dtr will be able to manage secondary risks using educational resources for medication and dietary modification independently Outcome: Progressing   Problem: Activity: Goal: Ability to ambulate and perform ADLs will improve Outcome: Progressing   Problem:  Education: Goal: Understanding of CV disease, CV risk reduction, and recovery process will improve Outcome: Progressing Goal: Individualized Educational Video(s) Outcome: Progressing   Problem: Activity: Goal: Ability to return to baseline activity level will improve Outcome: Progressing   Problem: Cardiovascular: Goal: Ability to achieve and maintain adequate cardiovascular perfusion will improve Outcome: Progressing Goal: Vascular access site(s) Level 0-1 will be maintained Outcome: Progressing   Problem: Health Behavior/Discharge Planning: Goal: Ability to safely manage health-related needs after discharge will improve Outcome: Progressing

## 2024-01-03 ENCOUNTER — Inpatient Hospital Stay (HOSPITAL_COMMUNITY)

## 2024-01-03 DIAGNOSIS — I639 Cerebral infarction, unspecified: Secondary | ICD-10-CM | POA: Diagnosis not present

## 2024-01-03 LAB — CBC
HCT: 29.2 % — ABNORMAL LOW (ref 36.0–46.0)
Hemoglobin: 9.6 g/dL — ABNORMAL LOW (ref 12.0–15.0)
MCH: 30 pg (ref 26.0–34.0)
MCHC: 32.9 g/dL (ref 30.0–36.0)
MCV: 91.3 fL (ref 80.0–100.0)
Platelets: 434 10*3/uL — ABNORMAL HIGH (ref 150–400)
RBC: 3.2 MIL/uL — ABNORMAL LOW (ref 3.87–5.11)
RDW: 13.7 % (ref 11.5–15.5)
WBC: 7.8 10*3/uL (ref 4.0–10.5)
nRBC: 0 % (ref 0.0–0.2)

## 2024-01-03 LAB — BASIC METABOLIC PANEL WITH GFR
Anion gap: 8 (ref 5–15)
BUN: 21 mg/dL (ref 8–23)
CO2: 23 mmol/L (ref 22–32)
Calcium: 8.8 mg/dL — ABNORMAL LOW (ref 8.9–10.3)
Chloride: 103 mmol/L (ref 98–111)
Creatinine, Ser: 1.04 mg/dL — ABNORMAL HIGH (ref 0.44–1.00)
GFR, Estimated: 54 mL/min — ABNORMAL LOW (ref >60)
Glucose, Bld: 110 mg/dL — ABNORMAL HIGH (ref 70–99)
Potassium: 3.8 mmol/L (ref 3.5–5.1)
Sodium: 134 mmol/L — ABNORMAL LOW (ref 135–145)

## 2024-01-03 NOTE — Progress Notes (Signed)
 Speech Language Pathology Daily Session Note  Patient Details  Name: [name] MRN: [id number] Date of Birth: Dec 27, 1940  Today's Date: 01/03/2024 SLP Individual Time: 0820-0840 SLP Individual Time Calculation (min): 20 min  Short Term Goals: Week 1: SLP Short Term Goal 1 (Week 1): Patient will utilize swallowing compensatory strategies during consumption of least restrictive diet given mod verbal A SLP Short Term Goal 2 (Week 1): Patient will demonstrate orientation to time, situation and location given max multimodal A SLP Short Term Goal 3 (Week 1): Patient will demonstrate problem solving abilities during basic daily situations given max multimodal A SLP Short Term Goal 4 (Week 1): Patient will demonstrate day to day recall of biographical information given max multimodal A SLP Short Term Goal 5 (Week 1): Patient will sustain attention for 3 minutes given max multimodal A  Skilled Therapeutic Interventions: Skilled therapy session focused on dysphagia goals. SLP facilitated session by observing patient with D3/thin liquids and medications crushed in puree. Patient with increasingly timely mastication and no s/sx of aspiration during thin liquids. Recommend continuation of current diet with completion of MBS this Am. Patient left in [facility] with alarm set and call bell in reach. Continue POC.   Pain Pain Assessment Pain Scale: 0-10 Pain Score: 0-No pain  Therapy/Group: Individual Therapy  Thierno Hun M.A., CCC-SLP 01/03/2024, 8:43 AM

## 2024-01-03 NOTE — Progress Notes (Addendum)
 PROGRESS NOTE   Subjective/Complaints:  Some neck soreness, LPN noted some drainage on  bandage, this was changed   ROS: Limited due to cognition, however denies CP, SOB, abd pain, n/v/d.    Objective:   No results found.  Recent Labs    01/03/24 0555  WBC 7.8  HGB 9.6*  HCT 29.2*  PLT 434*   Recent Labs    01/03/24 0555  NA 134*  K 3.8  CL 103  CO2 23  GLUCOSE 110*  BUN 21  CREATININE 1.04*  CALCIUM  8.8*    Intake/Output Summary (Last 24 hours) at 01/03/2024 0820 Last data filed at 01/02/2024 1421 Gross per 24 hour  Intake 220 ml  Output --  Net 220 ml        Physical Exam: Vital Signs Blood pressure 133/64, pulse 72, temperature 98 F (36.7 C), resp. rate 18, height 5\' 6"  (1.676 m), weight 57.5 kg, SpO2 93%.  HEENT - mod swelling Right internal jugular area, mild/mod tenderess around 2x2 dressing no blood on dressing  General: No acute distress Mood and affect are appropriate Heart: Regular rate and rhythm no rubs murmurs or extra sounds Lungs: Clear to auscultation, breathing unlabored, no rales or wheezes Abdomen: Positive bowel sounds, soft nontender to palpation, nondistended Extremities: No clubbing, cyanosis, or edema  Neuro:  alert , speech with minimal dysarthria  Musculoskeletal: Right thigh with swelling, post op dressing intact , no tenderness to palp  4-/5 BLE 4/5 BUE Tone is normal in UE   Assessment/Plan: 1. Functional deficits which require 3+ hours per day of interdisciplinary therapy in a comprehensive inpatient rehab setting. Physiatrist is providing close team supervision and 24 hour management of active medical problems listed below. Physiatrist and rehab team continue to assess barriers to discharge/monitor patient progress toward functional and medical goals  Care Tool:  Bathing    Body parts bathed by patient: Right arm, Left arm, Chest, Abdomen, Right upper leg, Left  upper leg, Face, Front perineal area   Body parts bathed by helper: Buttocks, Left lower leg, Right lower leg     Bathing assist Assist Level: Moderate Assistance - Patient 50 - 74%     Upper Body Dressing/Undressing Upper body dressing   What is the patient wearing?: Pull over shirt    Upper body assist Assist Level: Minimal Assistance - Patient > 75%    Lower Body Dressing/Undressing Lower body dressing      What is the patient wearing?: Underwear/pull up, Pants     Lower body assist Assist for lower body dressing: Maximal Assistance - Patient 25 - 49%     Toileting Toileting    Toileting assist Assist for toileting: Minimal Assistance - Patient > 75%     Transfers Chair/bed transfer  Transfers assist     Chair/bed transfer assist level: Contact Guard/Touching assist     Locomotion Ambulation   Ambulation assist      Assist level: Minimal Assistance - Patient > 75% Assistive device: Walker-rolling Max distance: 90 ft   Walk 10 feet activity   Assist     Assist level: Contact Guard/Touching assist Assistive device: Walker-rolling   Walk 50 feet activity  Assist Walk 50 feet with 2 turns activity did not occur: Safety/medical concerns  Assist level: Contact Guard/Touching assist Assistive device: Walker-rolling    Walk 150 feet activity   Assist Walk 150 feet activity did not occur: Safety/medical concerns         Walk 10 feet on uneven surface  activity   Assist Walk 10 feet on uneven surfaces activity did not occur: Safety/medical concerns         Wheelchair     Assist Is the patient using a wheelchair?: Yes Type of Wheelchair: Manual    Wheelchair assist level: Dependent - Patient 0% Max wheelchair distance: 15 ft    Wheelchair 50 feet with 2 turns activity    Assist    Wheelchair 50 feet with 2 turns activity did not occur: Safety/medical concerns       Wheelchair 150 feet activity     Assist   Wheelchair 150 feet activity did not occur: Safety/medical concerns       Blood pressure 133/64, pulse 72, temperature 98 F (36.7 C), resp. rate 18, height 5\' 6"  (1.676 m), weight 57.5 kg, SpO2 93%.  Medical Problem List and Plan: 1. 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  Antithrombotics: -DVT/anticoagulation:  Mechanical: Antiembolism stockings, thigh (TED hose) Bilateral lower extremities -12/30/23 DVT U/S yesterday showing age indeterminate DVTs of RLE mid to distal femoral vein, IVC filter placed Dr Marlena Sima 6/2 Will keep HOB elevated 30 deg x 2 hr, may resume therapy this afternoon  -antiplatelet therapy: Aspirin  81 mg daily and Plavix  75 mg daily x 3 weeks then Plavix  alone.PLAVIX  HELD UNTIL 5/31 DUE TO RECTAL BLEEDING 3. Pain Management: Oxycodone  as needed 4. Mood/Behavior/Sleep/dementia: Namenda  5 mg nightly, melatonin 5 mg nightly as needed             -antipsychotic agents: N/A             -Continue Telesitter for poor safety awareness Now awake and alert 6/5 5. Neuropsych/cognition: This patient is not capable of making decisions on her own behalf. 6. Skin/Wound Care: Routine skin checks 7. Fluids/Electrolytes/Nutrition: pt has reasonable appetite it appears but cognition might interfere with adequate follow through during meals RD consult  8.  Right displaced femoral neck fracture.  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 Curtiss Dowdy.  WBAT RLE with hip precautions 9.  Acute blood loss anemia/rectal bleeding.  Transfuse 1 unit packed red blood cells 5/26. 6/2 Hgb stable     Plavix  restarted 5/31    Latest Ref Rng & Units 01/03/2024    5:55 AM 12/31/2023    5:06 AM  12/29/2023    4:56 AM  CBC  WBC 4.0 - 10.5 K/uL 7.8  5.6    Hemoglobin 12.0 - 15.0 g/dL 9.6  16.1  09.6   Hematocrit 36.0 - 46.0 % 29.2  33.6  31.8   Platelets 150 - 400 K/uL 434  384     Mild drop in Hgb no overt rectal bleeding reported suspect small post  procedure hematoma R internal jugular area  11.  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  Urine sodium 97,serum osmo 366 -Na+ stable  -tighten Fluid restriction to 1200 cc.   - continue P.o. sodium chloride  1g BID    Latest Ref Rng & Units 01/03/2024    5:55 AM 12/31/2023    5:06 AM 12/29/2023    4:56 AM  BMP  Glucose 70 - 99 mg/dL 962  952  841   BUN 8 - 23 mg/dL 21  20  22    Creatinine 0.44 - 1.00 mg/dL 3.24  4.01  0.27   Sodium 135 - 145 mmol/L 134  130  130   Potassium 3.5 - 5.1 mmol/L 3.8  4.0  3.8   Chloride 98 - 111 mmol/L 103  100  94   CO2 22 - 32 mmol/L 23  21  23    Calcium  8.9 - 10.3 mg/dL 8.8  8.5  9.2     12.  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  Follow-up cardiology services 13.  History of oral cancer with tongue resection.  Follow-up outpatient [facility] 14.  Glaucoma.  Continue eyedrops as indicated 15.  Hypertension.  Toprol -XL 100 mg daily.  Norvasc  5mg  daily. Monitor with increased mobility  -5/31-6/1 BPs fine, monitor Vitals:   12/31/23 0845 12/31/23 0849 12/31/23 0934 12/31/23 1244  BP: (!) 119/59 131/63 (!) 140/70 114/67   12/31/23 2057 01/01/24 0556 01/01/24 1428 01/01/24 1954  BP: (!) 129/51 (!) 116/50 (!) 118/58 126/64   01/02/24 0504 01/02/24 1427 01/02/24 1958 01/03/24 0528  BP: 114/65 131/69 (!) 115/56 133/64    16.  Hyperlipidemia.  Crestor  5mg  daily 17.  Hypothyroidism.  Synthroid  75mcg daily 18.  Constipation.  Senokot S1 tablet twice daily as well as Dulcolax suppository   -LBM 5/28 -12/29/23 still no BM documented since 5/28 though poor PO lately; will give sorbitol 30ml once -12/30/23 BMs 3x yesterday into this morning,  monitor  Incont of stool 6/2  LOS: 7 days A FACE TO FACE EVALUATION WAS PERFORMED  Genetta Kenning 01/03/2024, 8:20 AM

## 2024-01-03 NOTE — Progress Notes (Signed)
 Occupational Therapy Session Note  Patient Details  Name: [name] MRN: [id number] Date of Birth: [birth year]  Today's Date: 01/03/2024 OT Individual Time: 1100-1130 OT Individual Time Calculation (min): 30 min  and Today's Date: 01/03/2024 OT Missed Time: 30 Minutes Missed Time Reason: Patient fatigue;Patient unwilling/refused to participate without medical reason   Short Term Goals: Week 1:  OT Short Term Goal 1 (Week 1): Pt will be able to don pants with min A using AE as needed OT Short Term Goal 2 (Week 1): Pt will be able to self cleanse post toileting with min A. OT Short Term Goal 3 (Week 1): Pt will sit to stand with CGA at [facility].  Skilled Therapeutic Interventions/Progress Updates:     Pt received in w/c.   Upon entering the room, pt stated "I am so tired and I feel very gloomy".  "I am not motivated right now to do anything".   Discussed with pt that her daughter would like for her to shower today and I suggested it would help her feel better.  Pt continues to decline. To help motivate her played music in the room and pt even "danced" along to a song using her arms.  Helped her pick out an outfit and set up the bathroom for the shower.  When I asked her to stand up and walk to the bathroom she said she was not up for it and did not even need to toilet at this time. Spent more time talking to pt about her goals and desire to leave the hospital as soon as possible.  Pt asking to lay down. Transferred from w/c to bed with CGA but then needed min A to lift RLE into the bed. Pt adjusted in bed with all needs met.  Pt resting in bed with all needs met. Alarm set and call light in reach.     Therapy Documentation Precautions:  Precautions Precautions: Fall Precaution/Restrictions Comments: Alz/Dementia Restrictions Weight Bearing Restrictions Per Provider Order: No RLE Weight Bearing Per Provider Order: Weight bearing as tolerated    Vital Signs: Therapy Vitals Temp: 98 F (36.7  C) Pulse Rate: 72 Resp: 18 BP: 133/64 Patient Position (if appropriate): Lying Oxygen Therapy SpO2: 93 % O2 Device: Room Air Pain: Pain Assessment Pain Scale: 0-10 Pain Score: 0-No pain ADL: ADL Eating: Set up Grooming: Minimal assistance Upper Body Bathing: Supervision/safety Lower Body Bathing: Moderate assistance Upper Body Dressing: Minimal assistance Lower Body Dressing: Maximal assistance Toileting: Moderate assistance Where Assessed-Toileting: Bedside Commode Toilet Transfer: Minimal assistance Toilet Transfer Method: Stand pivot Toilet Transfer Equipment: Bedside commode     Therapy/Group: Individual Therapy  Itzayana Pardy 01/03/2024, 8:43 AM

## 2024-01-03 NOTE — Progress Notes (Signed)
 Speech Language Pathology Daily Session Note  Patient Details  Name: [name] MRN: [id number] Date of Birth: [birth year]  Today's Date: 01/03/2024 SLP Individual Time: 1345-1416 SLP Individual Time Calculation (min): 31 min  Short Term Goals: Week 1: SLP Short Term Goal 1 (Week 1): Patient will utilize swallowing compensatory strategies during consumption of least restrictive diet given mod verbal A SLP Short Term Goal 2 (Week 1): Patient will demonstrate orientation to time, situation and location given max multimodal A SLP Short Term Goal 3 (Week 1): Patient will demonstrate problem solving abilities during basic daily situations given max multimodal A SLP Short Term Goal 4 (Week 1): Patient will demonstrate day to day recall of biographical information given max multimodal A SLP Short Term Goal 5 (Week 1): Patient will sustain attention for 3 minutes given max multimodal A  Skilled Therapeutic Interventions: SLP conducted skilled therapy session targeting cognitive goals. Patient targeted ongoing orientation, use of external aids, and problem solving. Patient benefited from total reinforcement of orientation items intermittently throughout session and demonstrates inability to recall information after 30s-76min undistracted delay. Patient states her address and phone number with min assist, unable to state names of grandchildren. SLP provided patient with written therapy schedule, which patient utilized to review daily events with max assist. At end of session, transported to commode where she was continent of bowel and bladder. Handoff completed to nurse tech at end of session. SLP will continue to target goals per plan of care.        Pain Pain Assessment Pain Scale: 0-10 Pain Score: 0-No pain  Therapy/Group: Individual Therapy  Trenice Mesa, M.A., CCC-SLP  Hiren Peplinski A Gudelia Eugene 01/03/2024, 2:48 PM

## 2024-01-03 NOTE — Procedures (Signed)
 Modified Barium Swallow Study  Patient Details  Name: [name] MRN: [id number] Date of Birth: Jul 28, 1941  Today's Date: 01/03/2024  Modified Barium Swallow completed.  Full report located under Chart Review in the Imaging Section.  History of Present Illness Patient is an 83 y.o. female with PMH: Alzheimer's and vascular dementia, COPD, osteoporosis, severe aortic stenosis s/p TAVR. She was admitted to [facility] on 12/21/23 with intermittent confusion. At baseline, she lives with her daughter but is able to complete basic level ADL's and is home alone during the day while daughter is at work. MRI brain showed small punctate left frontal CVA. Patient with  fall while admitted, resulting in right femoral neck fracture s/p THA on 12/21/23.   Clinical Impression Patient presents with mild oropharyngeal dysphagia. Oral phase is characterized by reduced lingual control/coordination and bolus prep/mastication resulting in prolonged mastication of solids and instance of posterior spillage of oral residuals resulting in penetration to the vocal cords (spontaneously cleared - PAS 4). Patient benefits from liquid wash and double swallows to clear lingual residuals. Pharyngeal phase is remarkable for limited epiglottic inversion, incomplete laryngeal vestibular closure and tongue base retraction. These deficits resulted in consistent flash penetration of thin liquids and pharyngeal residuals present on the BOT and vallecula. No other instances of penetration/aspiration present during remaining consistencies despite patient with consistent throat clears throughout study. During consumption of pill in puree, patient required multiple bites to clear from oral cavity. Recommend D3/thin liquids with medications whole vs crushed in puree. Patient should receive full supervision during mealtimes to cue for compensatory strategies due to patients cognitive deficits.   DIGEST Swallow Severity  Rating*  Safety: 1  Efficiency:1  Overall Pharyngeal Swallow Severity: mild 1: mild; 2: moderate; 3: severe; 4: profound  *The Dynamic Imaging Grade of Swallowing Toxicity is standardized for the head and neck cancer population, however, demonstrates promising clinical applications across populations to standardize the clinical rating of pharyngeal swallow safety and severity.    Factors that may increase risk of adverse event in presence of aspiration Roderick Civatte & Jessy Morocco 2021): Frail or deconditioned;Reduced cognitive function  Swallow Evaluation Recommendations Recommendations: PO diet PO Diet Recommendation: Dysphagia 3 (Mechanical soft);Thin liquids (Level 0) Liquid Administration via: Straw;Cup Medication Administration: Crushed with puree Supervision: Full supervision/cueing for swallowing strategies;Staff to assist with self-feeding Swallowing strategies  : Minimize environmental distractions;Slow rate;Small bites/sips Postural changes: Position pt fully upright for meals Oral care recommendations: Oral care BID (2x/day)   Markes Shatswell M.A., CCC-SLP 01/03/2024,9:30 AM

## 2024-01-03 NOTE — Progress Notes (Incomplete)
 Sitting up in chair, states her name, BD and that she is in [facility] when asked, [place]. Tele-sITTER IN PLACE

## 2024-01-03 NOTE — [place] FL2 (Signed)
 [place]  MEDICAID FL2 LEVEL OF CARE FORM     IDENTIFICATION  Patient Name: [name] Birthdate: Apr 11, 1941 Sex: female Admission Date (Current Location): 12/27/2023  [facility] and [place] Number:  Producer, television/film/video and Address:  [facility] [place]. [facility], [address] [place], [place] [address]      Provider Number: [id number]  Attending Physician Name and Address:  Genetta Kenning, MD  Relative Name and Phone Number:  [name] (son) #[phone]    Current Level of Care:   Recommended Level of Care: Skilled Nursing Facility Prior Approval Number:    Date Approved/Denied:   PASRR Number: [id number]  Discharge Plan: SNF    Current Diagnoses: Patient Active Problem List   Diagnosis Date Noted   Ischemic cerebrovascular accident (CVA) (HCC) 12/27/2023   Vision loss of right eye 12/26/2023   Hematochezia 12/26/2023   Abnormal finding on GI tract imaging 12/26/2023   ABLA (acute blood loss anemia) 12/26/2023   Preop cardiovascular exam 12/20/2023   Closed subcapital fracture of neck of right femur, initial encounter (HCC) 12/20/2023   Cerebrovascular accident (CVA) (HCC) 12/18/2023   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   Severe aortic stenosis 11/08/2023   Cognitive impairment 05/25/2020   COVID-19 virus infection 05/25/2020   Hypothyroidism 02/21/2018   Hypertension 02/21/2018   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   Pseudophakia 11/01/2012   COPD (chronic obstructive pulmonary disease) (HCC) 07/05/2012   Multiple thyroid  nodules 01/01/2012    Orientation RESPIRATION BLADDER Height & Weight     Self  Normal Incontinent Weight: 126 lb 12.2 oz (57.5 kg) Height:  5\' 6"  (167.6 cm)  BEHAVIORAL SYMPTOMS/MOOD NEUROLOGICAL BOWEL NUTRITION STATUS      Incontinent Diet  AMBULATORY STATUS  COMMUNICATION OF NEEDS Skin   Limited Assist Verbally Normal                       Personal Care Assistance Level of Assistance  Bathing, Feeding, Dressing Bathing Assistance: Limited assistance Feeding assistance: Limited assistance Dressing Assistance: Limited assistance     Functional Limitations Info  Sight, Hearing, Speech Sight Info: Adequate Hearing Info: Adequate Speech Info: Adequate    SPECIAL CARE FACTORS FREQUENCY  PT (By licensed PT), OT (By licensed OT), Speech therapy     PT Frequency: 5xs per week OT Frequency: 5xs per week     Speech Therapy Frequency: 5xs per week      Contractures Contractures Info: Not present    Additional Factors Info  Code Status, Allergies Code Status Info: Full Allergies Info: See discharge instructions           Current Medications (01/03/2024):  This is the current hospital active medication list Current Facility-Administered Medications  Medication Dose Route Frequency Provider Last Rate Last Admin   amLODipine  (NORVASC ) tablet 5 mg  5 mg Oral Daily Angiulli, Daniel J, PA-C   5 mg at 01/03/24 8469   aspirin  EC tablet 81 mg  81 mg Oral Daily Angiulli, Daniel J, PA-C   81 mg at 01/03/24 6295   bisacodyl  (DULCOLAX) suppository 10 mg  10 mg Rectal Daily Angiulli, Daniel J, PA-C   10 mg at 01/03/24 0831   clopidogrel  (PLAVIX ) tablet 75 mg  75 mg Oral Daily Angiulli, Daniel J, PA-C   75 mg at 01/03/24 0831   dorzolamide -timolol  (COSOPT ) 2-0.5 % ophthalmic solution 1 drop  1 drop Left Eye Q12H Sterling Eisenmenger, PA-C   1 drop at 01/03/24 6578   feeding supplement (ENSURE ENLIVE / ENSURE PLUS) liquid 237 mL  237 mL Oral BID BM Angiulli, Daniel J, PA-C   237 mL at 01/03/24 1236   latanoprost  (XALATAN ) 0.005 % ophthalmic solution 1 drop  1 drop Left Eye QHS Sterling Eisenmenger, PA-C   1 drop at 01/02/24 2113   levothyroxine  (SYNTHROID ) tablet 75 mcg  75 mcg Oral Q0600 Angiulli, Daniel J, PA-C   75 mcg at 01/03/24 0507   lip balm  (CARMEX) ointment   Topical PRN Angiulli, Daniel J, PA-C       melatonin tablet 5 mg  5 mg Oral QHS PRN Angiulli, Daniel J, PA-C   5 mg at 01/02/24 2020   memantine  (NAMENDA ) tablet 5 mg  5 mg Oral QHS AngiulliEverlyn Hockey, PA-C   5 mg at 01/02/24 2113   metoprolol  succinate (TOPROL -XL) 24 hr tablet 100 mg  100 mg Oral QPC lunch Sterling Eisenmenger, PA-C   100 mg at 01/03/24 1236   multivitamin with minerals tablet 1 tablet  1 tablet Oral QPC lunch Angiulli, Daniel J, PA-C   1 tablet at 01/03/24 1236   pantoprazole  (PROTONIX ) EC tablet 40 mg  40 mg Oral BID Angiulli, Daniel J, PA-C   40 mg at 01/03/24 4696   polyvinyl alcohol  (LIQUIFILM TEARS) 1.4 % ophthalmic solution 1 drop  1 drop Both Eyes PRN Angiulli, Daniel J, PA-C       prednisoLONE  acetate (PRED FORTE ) 1 % ophthalmic suspension 1 drop  1 drop Right Eye q AM Angiulli, Everlyn Hockey, PA-C   1 drop at 01/03/24 2952   rosuvastatin  (CRESTOR ) tablet 5 mg  5 mg Oral QPC lunch Sterling Eisenmenger, PA-C   5 mg at 01/03/24 1236   senna-docusate (Senokot-S) tablet 1 tablet  1 tablet Oral BID Angiulli, Daniel J, PA-C   1 tablet at 01/03/24 8413   sodium chloride  tablet 1 g  1 g Oral BID WC Angiulli, Daniel J, PA-C   1 g at 01/03/24 2440     Discharge Medications: Please see discharge summary for a list of discharge medications.  Relevant Imaging Results:  Relevant Lab Results:   Additional Information NU#[id number]  Rennis Case, LCSW

## 2024-01-03 NOTE — Progress Notes (Signed)
 Physical Therapy Session Note  Patient Details  Name: [name] MRN: [id number] Date of Birth: 03-09-1941  Today's Date: 01/03/2024 PT Individual Time: 1415-1445 PT Individual Time Calculation (min): 30 min   Short Term Goals: Week 1:  PT Short Term Goal 1 (Week 1): Pt will perform bed mobility with increased speed of initiation and overall CGA PT Short Term Goal 2 (Week 1): Pt will perform standing transfers with CGA using LRAD. PT Short Term Goal 3 (Week 1): Pt will ambulate at least 50 ft using LRAD with CGA. PT Short Term Goal 4 (Week 1): Pt will initiate stair training. PT Short Term Goal 5 (Week 1): Pt will complete TUG for outcome measure.  Skilled Therapeutic Interventions/Progress Updates:      Direct handoff of care from NT to start with patient sitting on toilet, finishing up a BM. Patient unsuccessful at wiping and needing assistance for posterior pericare. Sit<>stand to RW with CGA and patient needing totalA for thoroughness. Patient able to pull pants up in standing with CGA for balance. Ambulates from bathroom to sink with RW and CGA with mod cues for keeping body within walker frame and general safety awareness. Patient able to complete hand washing in standing position with CGA for balance while completing bi-manual tasks with both UE. Without a seated rest break, she was able to ambulate with CGA and RW from her room to day room gym ~176ft. Continued cues for upright posture, increased speed, increased stride length, and keeping body in walker frame. Brief seated rest break before returning to her room in similar manner, assist, and cues. Patient left sitting up in wheelchair with her son present and seat belt alarm on.   Therapy Documentation Precautions:  Precautions Precautions: Fall Precaution/Restrictions Comments: Alz/Dementia Restrictions Weight Bearing Restrictions Per Provider Order: No RLE Weight Bearing Per Provider Order: Weight bearing as  tolerated General:     Therapy/Group: Individual Therapy  [name] 01/03/2024, 8:39 AM

## 2024-01-03 NOTE — Progress Notes (Signed)
 Physical Therapy Weekly Progress Note  Patient Details  Name: [name] MRN: [id number] Date of Birth: [birth year]  Beginning of progress report period: Dec 28, 2023 End of progress report period: January 03, 2024  Patient has met {number 1-5:22450} of {number 1-5:20334} short term goals.  ***  Patient continues to demonstrate the following deficits {impairments:3041632} and therefore will continue to benefit from skilled PT intervention to increase functional independence with mobility.  Patient {LTG progression:3041653}.  {plan of WUJW:1191478}  PT Short Term Goals Week 1:  PT Short Term Goal 1 (Week 1): Pt will perform bed mobility with increased speed of initiation and overall CGA PT Short Term Goal 2 (Week 1): Pt will perform standing transfers with CGA using LRAD. PT Short Term Goal 3 (Week 1): Pt will ambulate at least 50 ft using LRAD with CGA. PT Short Term Goal 4 (Week 1): Pt will initiate stair training. PT Short Term Goal 5 (Week 1): Pt will complete TUG for outcome measure.  Skilled Therapeutic Interventions/Progress Updates:      Therapy Documentation Precautions:  Precautions Precautions: Fall Precaution/Restrictions Comments: Alz/Dementia Restrictions Weight Bearing Restrictions Per Provider Order: Yes RLE Weight Bearing Per Provider Order: Weight bearing as tolerated  Zameer Borman PTA  01/03/2024, 12:36 PM

## 2024-01-03 NOTE — Progress Notes (Signed)
 Physical Therapy Session Note  Patient Details  Name: [name] MRN: [id number] Date of Birth: [birth year]  Today's Date: 01/03/2024 PT Individual Time: 4098-1191 PT Individual Time Calculation (min): 42 min   Short Term Goals: Week 1:  PT Short Term Goal 1 (Week 1): Pt will perform bed mobility with increased speed of initiation and overall CGA PT Short Term Goal 2 (Week 1): Pt will perform standing transfers with CGA using LRAD. PT Short Term Goal 3 (Week 1): Pt will ambulate at least 50 ft using LRAD with CGA. PT Short Term Goal 4 (Week 1): Pt will initiate stair training. PT Short Term Goal 5 (Week 1): Pt will complete TUG for outcome measure.  Skilled Therapeutic Interventions/Progress Updates: Patient sitting in WC on entrance to room. Patient alert and agreeable to PT session.   Patient reported no pain during session. Pt oriented to self and place ("hospital"), and able to recall fall after PTA stated that pt had a stroke.   Therapeutic Activity: Transfers: Pt performed sit<>stand transfers throughout session with RW and with supervision/CGA for safety. Provided VC for pt to fully pivot by ensuring back of knees touch sitting surface.  Pt ambulated 165' using RW with supervision for safety. Pt required VC throughout to maintain safe proximity to RW, and to recall where PTA instructed to sit/move to.   Pt navigated 8 (6") steps using BHR with CGA/close supervision for safety. Pt required VC to ensure entire foot is placed on step prior to advancing, and to avoid using B UE to pull self up (reciprocal step pattern on 1st 4, then step-to after cue to decrease pull from UE). Pt performed step-to pattern on descent.    Pt performed above to increase functional independence/tolerance and safety to activity at home.  Patient sitting in WC at end of session with brakes locked, son present and updated on pt's progress during this session, belt alarm set, and all needs within reach.       Therapy Documentation Precautions:  Precautions Precautions: Fall Precaution/Restrictions Comments: Alz/Dementia Restrictions Weight Bearing Restrictions Per Provider Order: Yes RLE Weight Bearing Per Provider Order: Weight bearing as tolerated  Therapy/Group: Individual Therapy  Madalena Kesecker PTA 01/03/2024, 11:54 AM

## 2024-01-03 NOTE — Plan of Care (Signed)
  Problem: Consults Goal: RH STROKE PATIENT EDUCATION Description: See Patient Education module for education specifics  Outcome: Progressing   Problem: RH BOWEL ELIMINATION Goal: RH STG MANAGE BOWEL WITH ASSISTANCE Description: STG Manage Bowel with toileting Assistance. Outcome: Progressing Goal: RH STG MANAGE BOWEL W/MEDICATION W/ASSISTANCE Description: STG Manage Bowel with Medication with mod I Assistance. Outcome: Progressing   Problem: RH SAFETY Goal: RH STG ADHERE TO SAFETY PRECAUTIONS W/ASSISTANCE/DEVICE Description: STG Adhere to Safety Precautions With cues Assistance/Device. Outcome: Progressing   Problem: RH COGNITION-NURSING Goal: RH STG USES MEMORY AIDS/STRATEGIES W/ASSIST TO PROBLEM SOLVE Description: STG Uses Memory Aids/Strategies With cues Assistance to Problem Solve. Outcome: Progressing Goal: RH STG ANTICIPATES NEEDS/CALLS FOR ASSIST W/ASSIST/CUES Description: STG Anticipates Needs/Calls for Assist With Assistance/Cues. Outcome: Progressing   Problem: Activity: Goal: Ability to return to baseline activity level will improve Outcome: Progressing   Problem: Cardiovascular: Goal: Ability to achieve and maintain adequate cardiovascular perfusion will improve Outcome: Progressing Goal: Vascular access site(s) Level 0-1 will be maintained Outcome: Progressing

## 2024-01-03 NOTE — Progress Notes (Addendum)
 Patient ID: [name], female   DOB: 02-Jun-1941, 83 y.o.   MRN: [id number]  SW received message from pt dtr [name] reporting they can come in for family edu tomorrow 1pm-4pm. She requested return phone call to confirm .  [phone]- SW left message confirming fam edu tomorrow.   *SW received phone call from pt son [name] reporting  he and the rest of her children do not feel their mother is ready to be discharged. He spoke with admissions at [facility] and it is possible there may be a bed available. They would like it if she could stay here in rehab and not be so disoriented. SW explained SNF placement process if decided. SW also informed will share their concerns, they will still be in for family edu tomorrow.   SW sent out SNF referral.  [name], MSW, LCSW Office: [phone] Cell: [phone] Fax: [phone]

## 2024-01-04 ENCOUNTER — Encounter (HOSPITAL_COMMUNITY): Payer: Self-pay | Admitting: Physical Medicine & Rehabilitation

## 2024-01-04 DIAGNOSIS — I639 Cerebral infarction, unspecified: Secondary | ICD-10-CM | POA: Diagnosis not present

## 2024-01-04 MED ORDER — ENSURE ENLIVE PO LIQD
237.0000 mL | Freq: Three times a day (TID) | ORAL | Status: DC
  Administered 2024-01-04 – 2024-01-11 (×14): 237 mL via ORAL
  Filled 2024-01-04 (×22): qty 237

## 2024-01-04 NOTE — Progress Notes (Signed)
 Physical Therapy Session Note  Patient Details  Name: [name] MRN: [id number] Date of Birth: Jul 10, 1941  Today's Date: 01/04/2024 PT Individual Time: 1422-1504 PT Individual Time Calculation (min): 42 min   Short Term Goals: Week 1:  PT Short Term Goal 1 (Week 1): Pt will perform bed mobility with increased speed of initiation and overall CGA PT Short Term Goal 2 (Week 1): Pt will perform standing transfers with CGA using LRAD. PT Short Term Goal 3 (Week 1): Pt will ambulate at least 50 ft using LRAD with CGA. PT Short Term Goal 4 (Week 1): Pt will initiate stair training. PT Short Term Goal 5 (Week 1): Pt will complete TUG for outcome measure.  Skilled Therapeutic Interventions/Progress Updates: Patient sitting in [facility] with family present on entrance to room. Patient alert and agreeable to PT session.   Patient with no complaints of pain. Session focused on family education with pt's son and daughter. Pt currently set to d/c home with daughter. Pt family concerned about pt's ability to move around home safely. PTA emphasized that pt is at goal level (supervision) for transfers, bed mobility and household ambulation. Pt family stated there is only one curb step in garage, and that pt should not need to navigate at home as front entrance has no steps (Pt was transported in [facility] to main gym following bed mobility to navigate 4" curb step with RW and cues to do so safely with family feeling better about pt's ability to do so if it is needed at home with no further concerns in that regard). PTA stated to family to purchase plastic ball to go on legs of RW as pt's home is mostly carpeted, but that RW will have both plastic and rubber coverings, and that plastic coverings would also work and that it is up to their discretion. Pt daughter expressed concerns about balancing pt's care with personal work/life, and would have to look into assistance to be provided to pt while pt's daughter is at work or out  of house (son stated he is looking into places). Pt performed transfers throughout session with RW and supervision and VC for anterior scoot/hand placement and to pivot to sitting surface entirely (back of knees touching sitting surface) with education provided to family as well on safety aspect of this. Pt performed bed mobility with supervision and VC for sequence to roll to side and use of UE's to assist with truncal elevation with family feeling better about pt's ability to do so (PTA stated that pt may have trouble remember sequence due to dementia, and to remember supine<roll<to sit method). PTA did not have time to physically go over car transfers. PTA provided verbal education for pt sit back up to seat with RW and pivot into car with pt family verbalizing understanding.   Patient sitting in WC at end of session with brakes locked, family present and hand off to SLP.      Therapy Documentation Precautions:  Precautions Precautions: Fall Precaution/Restrictions Comments: Alz/Dementia Restrictions Weight Bearing Restrictions Per Provider Order: Yes RLE Weight Bearing Per Provider Order: Weight bearing as tolerated   Therapy/Group: Individual Therapy  Meriam Chojnowski PTA 01/04/2024, 3:25 PM

## 2024-01-04 NOTE — Plan of Care (Signed)
 Goals downgraded due to patients current progress Problem: RH Memory Goal: LTG Patient will demonstrate ability for day to day (SLP) Description: LTG:   Patient will demonstrate ability for day to day recall/carryover during cognitive/linguistic activities with assist  (SLP) Flowsheets (Taken 01/04/2024 1545) LTG: Patient will demonstrate ability for day to day recall/carryover during cognitive/linguistic activities with assist (SLP): Maximal Assistance - Patient 25 - 49%

## 2024-01-04 NOTE — Progress Notes (Signed)
 Patient ID: [name], female   DOB: [birth year], 83 y.o.   MRN: [id number]  SW returned phone call to pt son [name]. Family confirms they would like SNF placement, and current preference is [name]. SW will leave SNF list based on insurance in room.   *List left n room.   [name], MSW, LCSW Office: [phone] Cell: [phone] Fax: [phone]

## 2024-01-04 NOTE — Progress Notes (Signed)
 Occupational Therapy Session Note  Patient Details  Name: [name] MRN: [id number] Date of Birth: 07-24-1941  Today's Date: 01/04/2024 OT Individual Time: 8657-8469 OT Individual Time Calculation (min): 53 min    Short Term Goals: Week 1:  OT Short Term Goal 1 (Week 1): Pt will be able to don pants with min A using AE as needed OT Short Term Goal 2 (Week 1): Pt will be able to self cleanse post toileting with min A. OT Short Term Goal 3 (Week 1): Pt will sit to stand with CGA at [facility].  Skilled Therapeutic Interventions/Progress Updates:   Pt greeted seated in w/c finishing lunch with pts son present, pt agreeable to OT intervention.     Session focused on family education with son and daughter.   Transfers/bed mobility/functional mobility:  Pt requested to toilet during session. Pt completed ambulatory toilet transfer with Rw and supervision.    ADLs:  Grooming: pt stood at sink for hand hygiene with supervision, pt does require intermittent cues to sequence through steps of ADLs I.e turn on water, locate soap etc.  Toileting: pt with continent urine void completing 3/3 toileting tasks with supervision.    Education:  Formal family education provided to pts son and daughter on the below topics: -always using gait belt for functional mobility -recommendation on use of RW for functional ambulation - general assist needed for ADLS and functional mobility ( I.e supervision - CGA) - discussed DME needs, I.e RW and TTB -education provided on shower transfers to tub transfer bench with pt able to ambulate into tub room with RW and complete transfer with CGA but MOD verbal/visual cues for sequencing and technique. Provided education on fall prevention strategies for showers. -educated family on safe sit>stands strategies and how to manage RW  Family demonstrated appropriate level of assist with ADLs and functional mobility   Ended session with pt seated in w/c with all needs within  reach and family present.           Therapy Documentation Precautions:  Precautions Precautions: Fall Precaution/Restrictions Comments: Alz/Dementia Restrictions Weight Bearing Restrictions Per Provider Order: Yes RLE Weight Bearing Per Provider Order: Weight bearing as tolerated  Pain: no pain reported during session.     Therapy/Group: Individual Therapy  Willadean Hark 01/04/2024, 6:26 PM

## 2024-01-04 NOTE — Progress Notes (Signed)
 Speech Language Pathology Weekly Progress and Session Note  Patient Details  Name: [name] MRN: [id number] Date of Birth: [birth year]  Beginning of progress report period: Dec 28, 2023 End of progress report period: January 04, 2024  Today's Date: 01/04/2024 SLP Individual Time: 1478-2956 SLP Individual Time Calculation (min): 28 min  Short Term Goals: Week 1: SLP Short Term Goal 1 (Week 1): Patient will utilize swallowing compensatory strategies during consumption of least restrictive diet given mod verbal A SLP Short Term Goal 1 - Progress (Week 1): Met SLP Short Term Goal 2 (Week 1): Patient will demonstrate orientation to time, situation and location given max multimodal A SLP Short Term Goal 2 - Progress (Week 1): Met SLP Short Term Goal 3 (Week 1): Patient will demonstrate problem solving abilities during basic daily situations given max multimodal A SLP Short Term Goal 3 - Progress (Week 1): Met SLP Short Term Goal 4 (Week 1): Patient will demonstrate day to day recall of biographical information given max multimodal A SLP Short Term Goal 4 - Progress (Week 1): Not met SLP Short Term Goal 5 (Week 1): Patient will sustain attention for 3 minutes given max multimodal A SLP Short Term Goal 5 - Progress (Week 1): Met    New Short Term Goals: Week 2: SLP Short Term Goal 1 (Week 2): STG = LTG due to elos  Weekly Progress Updates: Pt has made good gains and has met 4 of 5 STGs this reporting period due to improved attention, orientation, safety awareness (problem solving) and use of swallowing strategies. Currently, patient continues to require max A for cognitive tasks with the exception of short term memory (requires total A to recall day to day task). MBS completed this reporting period with recommendation for D3/thin liquids and medications whole vs crushed in puree. Pt/family eduction ongoing. Pt would benefit from continued ST intervention to maximize cognition and swallowing safety  in order to maximize functional independence at d/c.    Intensity: Minumum of 1-2 x/day, 30 to 90 minutes Frequency: 3 to 5 out of 7 days Duration/Length of Stay: 6/10 Treatment/Interventions: Cognitive remediation/compensation;Dysphagia/aspiration precaution training;Internal/external aids;Cueing hierarchy;Therapeutic Activities;Functional tasks;Multimodal communication approach;Patient/family education   Daily Session  Skilled Therapeutic Interventions:  Skilled therapy session focused on family education. SLP educated patients family (son, daughter) on patients current ST goals and results of MBS completed yesterday. SLP reviewed basic oropharyngeal anatomy and physiology and subsequent diet recommendations. SLP discussed patients current level of cognitive functioning and importance of continuing use of external aids at d/c. SLP provided handout. Patients family verbalized understanding with no further questions. Patient left in [facility] with alarm set and call bell in reach. Continue POC.   Pain None reported   Therapy/Group: Individual Therapy  Daiki Dicostanzo M.A., CCC-SLP 01/04/2024, 3:43 PM

## 2024-01-04 NOTE — Progress Notes (Signed)
 PROGRESS NOTE   Subjective/Complaints:  RIght eye injected , pt denies pain or rubbing eye Appreciate MBS result ROS: Limited due to cognition, however denies CP, SOB, abd pain, n/v/d.    Objective:   DG Swallowing Func-Speech Pathology Result Date: 01/03/2024 Table formatting from the original result was not included. Modified Barium Swallow Study Patient Details Name: [name] MRN: [id number] Date of Birth: [birth year] Today's Date: 01/03/2024 HPI/PMH: HPI: Patient is an 83 y.o. female with PMH: Alzheimer's and vascular dementia, COPD, osteoporosis, severe aortic stenosis s/p TAVR. She was admitted to [facility] on 12/21/23 with intermittent confusion. At baseline, she lives with her daughter but is able to complete basic level ADL's and is home alone during the day while daughter is at work. MRI brain showed small punctate left frontal CVA. Patient with  fall while admitted, resulting in right femoral neck fracture s/p THA on 12/21/23. Clinical Impression: Patient presents with mild oropharyngeal dysphagia. Oral phase is characterized by reduced lingual control/coordination and bolus prep/mastication resulting in prolonged mastication of solids and instance of posterior spillage of oral residuals resulting in penetration to the vocal cords (spontaneously cleared - PAS 4). Patient benefits from liquid wash and double swallows to clear lingual residuals. Pharyngeal phase is remarkable for limited epiglottic inversion, incomplete laryngeal vestibular closure and tongue base retraction. These deficits resulted in consistent flash penetration of thin liquids and pharyngeal residuals present on the BOT and vallecula. No other instances of penetration/aspiration present during remaining consistencies despite patient with consistent throat clears throughout study. During consumption of pill in puree, patient required multiple bites to clear from oral cavity.  Recommend D3/thin liquids with medications whole vs crushed in puree. Patient should receive full supervision during mealtimes to cue for compensatory strategies due to patients cognitive deficits. DIGEST Swallow Severity Rating*  Safety: 1  Efficiency:1  Overall Pharyngeal Swallow Severity: mild 1: mild; 2: moderate; 3: severe; 4: profound *The Dynamic Imaging Grade of Swallowing Toxicity is standardized for the head and neck cancer population, however, demonstrates promising clinical applications across populations to standardize the clinical rating of pharyngeal swallow safety and severity. Factors that may increase risk of adverse event in presence of aspiration Roderick Civatte & Jessy Morocco 2021): Factors that may increase risk of adverse event in presence of aspiration Roderick Civatte & Jessy Morocco 2021): Frail or deconditioned; Reduced cognitive function Recommendations/Plan: Swallowing Evaluation Recommendations Swallowing Evaluation Recommendations Recommendations: PO diet PO Diet Recommendation: Dysphagia 3 (Mechanical soft); Thin liquids (Level 0) Liquid Administration via: Straw; Cup Medication Administration: Crushed with puree Supervision: Full supervision/cueing for swallowing strategies; Staff to assist with self-feeding Swallowing strategies  : Minimize environmental distractions; Slow rate; Small bites/sips Postural changes: Position pt fully upright for meals Oral care recommendations: Oral care BID (2x/day) Treatment Plan Treatment Plan Treatment recommendations: Therapy as outlined in treatment plan below Follow-up recommendations: Home health SLP Functional status assessment: Patient has had a recent decline in their functional status and demonstrates the ability to make significant improvements in function in a reasonable and predictable amount of time. Treatment frequency: Min 2x/week Treatment duration: 2 weeks Interventions: Aspiration precaution training; Oropharyngeal exercises; Patient/family education; Diet  toleration management by SLP; Compensatory techniques Recommendations Recommendations for  follow up therapy are one component of a multi-disciplinary discharge planning process, led by the attending physician.  Recommendations may be updated based on patient status, additional functional criteria and insurance authorization. Assessment: Orofacial Exam: Orofacial Exam Oral Cavity: Oral Hygiene: WFL Oral Cavity - Dentition: Adequate natural dentition Orofacial Anatomy: WFL Anatomy: Anatomy: Prominent cricopharyngeus Boluses Administered: Boluses Administered Boluses Administered: Thin liquids (Level 0); Puree; Solid  Oral Impairment Domain: Oral Impairment Domain Lip Closure: No labial escape Tongue control during bolus hold: Cohesive bolus between tongue to palatal seal Bolus preparation/mastication: Slow prolonged chewing/mashing with complete recollection Bolus transport/lingual motion: Slow tongue motion Oral residue: Residue collection on oral structures Location of oral residue : Tongue; Palate Initiation of pharyngeal swallow : -- (x1 initiation at vocal cords due to posterior spillage of oral residuals)  Pharyngeal Impairment Domain: Pharyngeal Impairment Domain Soft palate elevation: No bolus between soft palate (SP)/pharyngeal wall (PW) Laryngeal elevation: Partial superior movement of thyroid  cartilage/partial approximation of arytenoids to epiglottic petiole Anterior hyoid excursion: Partial anterior movement Epiglottic movement: -- (limited inversion) Laryngeal vestibule closure: Incomplete, narrow column air/contrast in laryngeal vestibule Pharyngeal stripping wave : Present - diminished Pharyngeal contraction (A/P view only): N/A Pharyngoesophageal segment opening: Partial distention/partial duration, partial obstruction of flow (CP Bar) Tongue base retraction: Trace column of contrast or air between tongue base and PPW Pharyngeal residue: Collection of residue within or on pharyngeal structures  Location of pharyngeal residue: Valleculae; Tongue base  Esophageal Impairment Domain: No data recorded Pill: Pill Consistency administered: Puree Puree: WFL (required multiple bites of puree) Penetration/Aspiration Scale Score: Penetration/Aspiration Scale Score 1.  Material does not enter airway: Puree; Solid; Pill 4.  Material enters airway, CONTACTS cords then ejected out: Thin liquids (Level 0) Compensatory Strategies: Compensatory Strategies Compensatory strategies: Yes Multiple swallows: Effective Effective Multiple Swallows: Puree; Solid; Pill Liquid wash: Effective Effective Liquid Wash: Puree; Solid   General Information: No data recorded Diet Prior to this Study: Dysphagia 3 (mechanical soft); Thin liquids (Level 0)   No data recorded  Respiratory Status: WFL   Supplemental O2: None (Room air)   No data recorded Behavior/Cognition: Alert; Cooperative Self-Feeding Abilities: Needs assist with self-feeding No data recorded Volitional Cough: Able to elicit Volitional Swallow: Able to elicit Exam Limitations: No limitations Goal Planning: Prognosis for improved oropharyngeal function: Guarded Barriers to Reach Goals: Cognitive deficits No data recorded No data recorded Consulted and agree with results and recommendations: Patient; Nurse Pain: No data recorded End of Session: Start Time:No data recorded Stop Time: No data recorded Time Calculation:No data recorded Charges: No data recorded SLP visit diagnosis: SLP Visit Diagnosis: Dysphagia, oropharyngeal phase (R13.12) Past Medical History: Past Medical History: Diagnosis Date  Cataract   COPD (chronic obstructive pulmonary disease) (HCC)   Glaucoma   Goiter   Hypertension 02/21/2018  Hypothyroid   Memory loss   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 Abel Hoe & Dr. Honey Lusty  Scoliosis   Severe aortic  stenosis   Wears glasses  Past Surgical History: Past Surgical History: Procedure Laterality Date  CESAREAN SECTION  07/31/1976  TWINS, ONE WAS STILLBORN  INTRAOPERATIVE TRANSTHORACIC ECHOCARDIOGRAM N/A 11/20/2023  Procedure: ECHOCARDIOGRAM, TRANSTHORACIC;  Surgeon: Odie Benne, MD;  Location: [facility] INVASIVE CV [facility];  Service: Cardiovascular;  Laterality: N/A;  IR IVC FILTER PLMT / S&I Dan Dun GUID/MOD SED  12/31/2023  MOUTH SURGERY  07/31/1994  tongue resection  NODULE REMOVED    BENIGN, LOWER NECK  RIGHT HEART CATH AND CORONARY ANGIOGRAPHY N/A 09/21/2023  Procedure: RIGHT HEART CATH AND CORONARY ANGIOGRAPHY;  Surgeon: Odie Benne, MD;  Location: [facility] INVASIVE CV [facility];  Service: Cardiovascular;  Laterality: N/A;  TOTAL HIP ARTHROPLASTY Right 12/21/2023  Procedure: ARTHROPLASTY, HIP, TOTAL, ANTERIOR APPROACH;  Surgeon: Laneta Pintos, MD;  Location: [facility] OR;  Service: Orthopedics;  Laterality: Right;  HEMI HIP [name] M.A., CCC-SLP 01/03/2024, 9:37 AM   Recent Labs    01/03/24 0555  WBC 7.8  HGB 9.6*  HCT 29.2*  PLT 434*   Recent Labs    01/03/24 0555  NA 134*  K 3.8  CL 103  CO2 23  GLUCOSE 110*  BUN 21  CREATININE 1.04*  CALCIUM  8.8*    Intake/Output Summary (Last 24 hours) at 01/04/2024 0856 Last data filed at 01/04/2024 1610 Gross per 24 hour  Intake 740 ml  Output --  Net 740 ml        Physical Exam: Vital Signs Blood pressure (!) 133/53, pulse 73, temperature 98.3 F (36.8 C), temperature source Oral, resp. rate 18, height 5\' 6"  (1.676 m), weight 57.5 kg, SpO2 97%.  HEENT - mod swelling Right internal jugular area, mild/mod tenderess around 2x2 dressing no blood on dressing  RIght eye medial subconjunctival hemorrhage General: No acute distress Mood and affect are appropriate Heart: Regular rate and rhythm no rubs murmurs or extra sounds Lungs: Clear to auscultation, breathing unlabored, no rales or wheezes Abdomen: Positive bowel sounds, soft nontender  to palpation, nondistended Extremities: No clubbing, cyanosis, or edema  Neuro:  alert , speech with minimal dysarthria  Musculoskeletal: Right thigh with swelling, post op dressing intact , no tenderness to palp  4-/5 BLE 4/5 BUE Tone is normal in UE   Assessment/Plan: 1. Functional deficits which require 3+ hours per day of interdisciplinary therapy in a comprehensive inpatient rehab setting. Physiatrist is providing close team supervision and 24 hour management of active medical problems listed below. Physiatrist and rehab team continue to assess barriers to discharge/monitor patient progress toward functional and medical goals  Care Tool:  Bathing    Body parts bathed by patient: Right arm, Left arm, Chest, Abdomen, Right upper leg, Left upper leg, Face, Front perineal area   Body parts bathed by helper: Buttocks, Left lower leg, Right lower leg     Bathing assist Assist Level: Moderate Assistance - Patient 50 - 74%     Upper Body Dressing/Undressing Upper body dressing   What is the patient wearing?: Pull over shirt    Upper body assist Assist Level: Minimal Assistance - Patient > 75%    Lower Body Dressing/Undressing Lower body dressing      What is the patient wearing?: Underwear/pull up, Pants     Lower body assist Assist for lower body dressing: Maximal Assistance - Patient 25 - 49%     Toileting Toileting    Toileting assist Assist for toileting: Minimal Assistance - Patient > 75%     Transfers Chair/bed transfer  Transfers assist     Chair/bed transfer assist level: Contact Guard/Touching assist     Locomotion Ambulation   Ambulation assist      Assist level: Contact Guard/Touching assist Assistive device: Walker-rolling Max distance: 165   Walk 10 feet activity   Assist     Assist level: Contact Guard/Touching assist Assistive device: Walker-rolling   Walk 50 feet  activity   Assist Walk 50 feet with 2 turns activity did not  occur: Safety/medical concerns  Assist level: Contact Guard/Touching assist Assistive device: Walker-rolling    Walk 150 feet activity   Assist Walk 150 feet activity did not occur: Safety/medical concerns  Assist level: Contact Guard/Touching assist Assistive device: Walker-rolling    Walk 10 feet on uneven surface  activity   Assist Walk 10 feet on uneven surfaces activity did not occur: Safety/medical concerns   Assist level: Contact Guard/Touching assist Assistive device: Walker-rolling   Wheelchair     Assist Is the patient using a wheelchair?: Yes Type of Wheelchair: Manual    Wheelchair assist level: Dependent - Patient 0% Max wheelchair distance: 15 ft    Wheelchair 50 feet with 2 turns activity    Assist    Wheelchair 50 feet with 2 turns activity did not occur: Safety/medical concerns       Wheelchair 150 feet activity     Assist  Wheelchair 150 feet activity did not occur: Safety/medical concerns       Blood pressure (!) 133/53, pulse 73, temperature 98.3 F (36.8 C), temperature source Oral, resp. rate 18, height 5\' 6"  (1.676 m), weight 57.5 kg, SpO2 97%.  Medical Problem List and Plan: 1. 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  Antithrombotics: -DVT/anticoagulation:  Mechanical: Antiembolism stockings, thigh (TED hose) Bilateral lower extremities -12/30/23 DVT U/S yesterday showing age indeterminate DVTs of RLE mid to distal femoral vein, IVC filter placed Dr Marlena Sima 6/2 Will keep HOB elevated 30 deg x 2 hr, may resume therapy this afternoon  -antiplatelet therapy: Aspirin  81 mg daily and Plavix  75 mg daily x 3 weeks then Plavix   alone.started 5/31 3. Pain Management: Oxycodone  as needed 4. Mood/Behavior/Sleep/dementia: Namenda  5 mg nightly, melatonin 5 mg nightly as needed             -antipsychotic agents: N/A             -Continue Telesitter for poor safety awareness Now awake and alert 6/5 5. Neuropsych/cognition: This patient is not capable of making decisions on her own behalf. 6. Skin/Wound Care: Routine skin checks 7. Fluids/Electrolytes/Nutrition: pt has reasonable appetite it appears but cognition might interfere with adequate follow through during meals RD consult  8.  Right displaced femoral neck fracture.  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 Curtiss Dowdy.  WBAT RLE with hip precautions 9.  Acute blood loss anemia/rectal bleeding.  Transfuse 1 unit packed red blood cells 5/26. 6/2 Hgb stable     Plavix  restarted 5/31    Latest Ref Rng & Units 01/03/2024    5:55 AM 12/31/2023    5:06 AM 12/29/2023    4:56 AM  CBC  WBC 4.0 - 10.5 K/uL 7.8  5.6    Hemoglobin 12.0 - 15.0 g/dL 9.6  69.6  29.5   Hematocrit 36.0 - 46.0 % 29.2  33.6  31.8   Platelets 150 - 400 K/uL 434  384     Mild drop in  Hgb no overt rectal bleeding reported suspect small post procedure hematoma R internal jugular area  11.  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  Urine sodium 97,serum osmo 366 -Na+ stable  -tighten Fluid restriction to 1200 cc.   - continue P.o. sodium chloride  1g BID    Latest Ref Rng & Units 01/03/2024    5:55 AM 12/31/2023    5:06 AM 12/29/2023    4:56 AM  BMP  Glucose 70 - 99 mg/dL 409  811  914   BUN 8 - 23 mg/dL 21  20  22    Creatinine 0.44 - 1.00 mg/dL 7.82  9.56  2.13   Sodium 135 - 145 mmol/L 134  130  130   Potassium 3.5 - 5.1 mmol/L 3.8  4.0  3.8   Chloride 98 - 111 mmol/L 103  100  94   CO2 22 - 32 mmol/L 23  21  23    Calcium  8.9 - 10.3 mg/dL 8.8  8.5  9.2     12.  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  Follow-up cardiology services 13.  History of oral  cancer with tongue resection.  Follow-up outpatient [facility] 14.  Glaucoma.  Continue eyedrops as indicated 15.  Hypertension.  Toprol -XL 100 mg daily.  Norvasc  5mg  daily. Monitor with increased mobility  -5/31-6/1 BPs fine, monitor Vitals:   12/31/23 1244 12/31/23 2057 01/01/24 0556 01/01/24 1428  BP: 114/67 (!) 129/51 (!) 116/50 (!) 118/58   01/01/24 1954 01/02/24 0504 01/02/24 1427 01/02/24 1958  BP: 126/64 114/65 131/69 (!) 115/56   01/03/24 0528 01/03/24 1653 01/03/24 2020 01/04/24 0554  BP: 133/64 (!) 118/59 127/65 (!) 133/53    16.  Hyperlipidemia.  Crestor  5mg  daily 17.  Hypothyroidism.  Synthroid  75mcg daily 18.  Constipation.  Senokot S1 tablet twice daily as well as Dulcolax suppository   BM x 2 6/5  LOS: 8 days A FACE TO FACE EVALUATION WAS PERFORMED  [name] 01/04/2024, 8:56 AM

## 2024-01-04 NOTE — Progress Notes (Signed)
 Initial Nutrition Assessment  DOCUMENTATION CODES:   Not applicable  INTERVENTION:  Continue Multivitamin w/ minerals daily Ensure Enlive po TID, each supplement provides 350 kcal and 20 grams of protein. Magic cup BID with meals, each supplement provides 290 kcal and 9 grams of protein Encourage good PO intake   NUTRITION DIAGNOSIS:  Increased nutrient needs related to chronic illness as evidenced by estimated needs.  GOAL:  Patient will meet greater than or equal to 90% of their needs  MONITOR:  PO intake, Supplement acceptance, I & O's, Labs  REASON FOR ASSESSMENT:  Consult Poor PO  ASSESSMENT:  83 y.o. female admitted to CIR after acute ischemic CVA and unwitnessed fall in the hospital s/p ORIF of R femoral neck. PMH includes COPD, vascular dementia/Alzheimer's, CAD, HTN, HLD, hypothyroidism, and oral cancer w/ tongue resection.   5/29 - Admitted 6/04 - diet downgraded to dysphagia 3 6/05 - MBS, recommend continue dysphagia 3  Pt unavailable at time of RD visit, with therapy. Pt RN was off unit and unable to discuss pt current PO intake. Pt with Ensure already ordered, per chart pt is accepting all Ensures; although unsure completion of supplement.   Meal Intake 6/02: 0% dinner 6/03: 20% lunch, 15% dinner 6/04: 20% breakfast, 10% lunch 6/05: 20%  lunch, 40% dinner 6/06: 20% breakfast   Nutrition Related Medications: Dulcolax, MVI, Protonix , Senokot-S, Sodium Chloride  tablet Labs (from 01/03/24): Sodium 134, BUN 21, Creatinine 1.04   NUTRITION - FOCUSED PHYSICAL EXAM:  Deferred to follow-up.   Diet Order:   Diet Order             DIET DYS 3 Room service appropriate? Yes; Fluid consistency: Thin  Diet effective now                  EDUCATION NEEDS: No education needs have been identified at this time  Skin:  Skin Assessment: Reviewed RN Assessment  Last BM:  6/5  Height:  Ht Readings from Last 1 Encounters:  12/28/23 5\' 6"  (1.676 m)   Weight:   Wt Readings from Last 1 Encounters:  01/03/24 57.5 kg   Ideal Body Weight:  59.1 kg  BMI:  Body mass index is 20.46 kg/m.  Estimated Nutritional Needs:  Kcal:  1600-1800 Protein:  80-100 grams Fluid:  >/= 1.6 L   Doneta Furbish RD, LDN Clinical Dietitian

## 2024-01-04 NOTE — Progress Notes (Signed)
 Occupational Therapy Weekly Progress Note  Patient Details  Name: [name] MRN: [id number] Date of Birth: Jan 12, 1941  Beginning of progress report period: Dec 28, 2023 End of progress report period: January 04, 2024  Today's Date: 01/04/2024  Patient has met 3 of 3 short term goals.  Pt making appropriate progress towards OT goals this reporting period with pt completing ADLs with supervision- CGA for toileting and grooming standing at the sink, min A for LB dressing and bathing. Pt completes ambulatory ADL transfers with RW and supervision. Pt continues to present with baseline cognitive deficits with pt needing to utilize external cues for orientation questions and pt requires intermittent cues to problem solve and sequence through ADLs in novel environment. Family ed was completed on 6/6 with daughter and son who are requesting ST SNF placement to allow increased time for pt to progress and allow time for family to set- up their home to accommodate for their mothers new level of function. Awaiting SNF placement for DC, continue POC.   Patient continues to demonstrate the following deficits: muscle weakness and muscle joint tightness, decreased memory, and decreased standing balance and decreased balance strategies and therefore will continue to benefit from skilled OT intervention to enhance overall performance with BADL.  Patient progressing toward long term goals..  Continue plan of care.  OT Short Term Goals Week 1:  OT Short Term Goal 1 (Week 1): Pt will be able to don pants with min A using AE as needed OT Short Term Goal 1 - Progress (Week 1): Met OT Short Term Goal 2 (Week 1): Pt will be able to self cleanse post toileting with min A. OT Short Term Goal 2 - Progress (Week 1): Met OT Short Term Goal 3 (Week 1): Pt will sit to stand with CGA at [facility]. OT Short Term Goal 3 - Progress (Week 1): Met Week 2:  OT Short Term Goal 1 (Week 2): STG= LTGs   Therapy Documentation Precautions:   Precautions Precautions: Fall Precaution/Restrictions Comments: Alz/Dementia Restrictions Weight Bearing Restrictions Per Provider Order: Yes RLE Weight Bearing Per Provider Order: Weight bearing as tolerated    Therapy/Group: Individual Therapy  Willadean Hark 01/04/2024, 6:35 PM

## 2024-01-04 NOTE — Progress Notes (Signed)
 Occupational Therapy Session Note  Patient Details  Name: [name] MRN: [id number] Date of Birth: 02/27/1941  Today's Date: 01/04/2024 OT Individual Time: 7829-5621 OT Individual Time Calculation (min): 45 min    Short Term Goals: Week 1:  OT Short Term Goal 1 (Week 1): Pt will be able to don pants with min A using AE as needed OT Short Term Goal 2 (Week 1): Pt will be able to self cleanse post toileting with min A. OT Short Term Goal 3 (Week 1): Pt will sit to stand with CGA at [facility].  Skilled Therapeutic Interventions/Progress Updates:    Pt received in w/c dressed for the day with the RN helping her to put on her jacket as she stated she was feeling very cold. Suggested today would be a good day to shower. Pt declined saying she was too cold. Tried several times to have her take a shower as she declined yesterday but pt was very adamant that she would feel too cold.   Engaged pt in orientation activities.  I asked her to write down today's date, she recalled 2025 but was unsure of month but on her own flipped the paper over to see todays therapy schedule and saw June.  She did well using that aid without cues, showing good problem solving.   When asked where she was she jokingly said "[facility]".  She then looked at reminder sign on wall to tell me the name of the hospital but she did say rehab on her own.  Pt able to follow conversation well and has a good awareness that she is struggling with her memory.   Pt agreeable to trying to toilet despite not feeling that she needed to go.  Pt stood up and ambulated with RW with supervision only.  Able to manage clothing down and up with supervision. Pt did void (NT documented in flow sheet) and self cleansed).  Stood up and ambulated to sink with RW with supervision to wash hands.  Pt participated well.  Ambulated to w/c to rest.  Belt alarm on and all needs met.  Therapy Documentation Precautions:  Precautions Precautions:  Fall Precaution/Restrictions Comments: Alz/Dementia Restrictions Weight Bearing Restrictions Per Provider Order: Yes RLE Weight Bearing Per Provider Order: Weight bearing as tolerated   Pain: Pain Assessment Pain Scale: 0-10 Pain Score: 0-No pain ADL: ADL Eating: Set up Grooming: Minimal assistance Upper Body Bathing: Supervision/safety Lower Body Bathing: Moderate assistance Upper Body Dressing: Minimal assistance Lower Body Dressing: Maximal assistance Toileting: Moderate assistance Where Assessed-Toileting: Bedside Commode Toilet Transfer: Minimal assistance Toilet Transfer Method: Stand pivot Toilet Transfer Equipment: Bedside commode   Therapy/Group: Individual Therapy  Cyndel Griffey 01/04/2024, 12:51 PM

## 2024-01-05 DIAGNOSIS — I10 Essential (primary) hypertension: Secondary | ICD-10-CM | POA: Diagnosis not present

## 2024-01-05 DIAGNOSIS — K5901 Slow transit constipation: Secondary | ICD-10-CM | POA: Diagnosis not present

## 2024-01-05 DIAGNOSIS — I639 Cerebral infarction, unspecified: Secondary | ICD-10-CM | POA: Diagnosis not present

## 2024-01-05 NOTE — Progress Notes (Signed)
 Occupational Therapy Session Note  Patient Details  Name: [name] MRN: [id number] Date of Birth: March 26, 1941  Today's Date: 01/05/2024 OT Individual Time: 1005-1050 and 1445-1537 OT Individual Time Calculation (min): 45 min and 52 min    Short Term Goals: Week 1:  OT Short Term Goal 1 (Week 1): Pt will be able to don pants with min A using AE as needed OT Short Term Goal 1 - Progress (Week 1): Met OT Short Term Goal 2 (Week 1): Pt will be able to self cleanse post toileting with min A. OT Short Term Goal 2 - Progress (Week 1): Met OT Short Term Goal 3 (Week 1): Pt will sit to stand with CGA at [facility]. OT Short Term Goal 3 - Progress (Week 1): Met Week 2:  OT Short Term Goal 1 (Week 2): STG= LTGs  Skilled Therapeutic Interventions/Progress Updates:    Visit 1:no c/o pain  Pt received in w/c and was agreeable to a shower today! She requested to brush her teeth first. Pt stood from w/c, ambulated to sink, stood and brushed her teeth, ambulated to toilet, pulled down her pants, voided and self cleansed all with supervision.  She did need A to manage pants and tight TED hose off to prepare to get in shower. Pt stepped into shower with cga and sat on seat.  Washed body with A for feet.  Dried off and sat on BSC over toilet to dress. Needed A to start pants over feet and don slip on shoes.  (Asked NT to help with TEDs due to limited time).  Pt donned UB clothing. And ambulated back to wc to rest. Belt on and all needs met.   Visit 2:  no c/o pain  Pt received in w/c with son and dtr present. Used this opportunity to further family education about how to cue pt, how to gently facilitate orientation,  how to position themselves when ambulating her.  Pt frequently asking what we were talking about and I reoriented her to the conversation.  Pt agreeable to trying to toilet.  Suggested pull ups as the hospital briefs are difficulty for her to manage.  Pt ambulated to toilet and toileted with  supervision except for min A to manage hospital briefs.  Pt walked out to the sink to wash hands and brush hair.  She then walked a loop in the unit with RW with light CGA. Pt ambulated over 180 ft with no rest break or LOB, occasional cues to keep arms relaxed so she is closer in the frame of the walker.  Pt participated well.  Resting in w/c with alarm belt on and all needs met.    Therapy Documentation Precautions:  Precautions Precautions: Fall Precaution/Restrictions Comments: Alz/Dementia Restrictions Weight Bearing Restrictions Per Provider Order: Yes RLE Weight Bearing Per Provider Order: Weight bearing as tolerated   Pain: Pain Assessment Pain Score: 0-No pain ADL: ADL Eating: Set up Grooming: Supervision/safety Where Assessed-Grooming: Standing at sink Upper Body Bathing: Supervision/safety Where Assessed-Upper Body Bathing: Shower Lower Body Bathing: Minimal assistance Where Assessed-Lower Body Bathing: Shower Upper Body Dressing: Supervision/safety Lower Body Dressing: Minimal assistance, Moderate assistance Toileting: Supervision/safety Where Assessed-Toileting: Teacher, adult education: Close supervision Statistician Method: Proofreader: Therapist, sports Transfer: Administrator, arts Method: Designer, industrial/product: Emergency planning/management officer, Grab bars       Therapy/Group: Individual Therapy  Yides Saidi 01/05/2024, 12:23 PM

## 2024-01-05 NOTE — Progress Notes (Signed)
 PROGRESS NOTE   Subjective/Complaints:  Pt doing ok, slept well, denies pain. Unsure of LBM but looks like had one this morning after eval. Urinating fine. Denies any other complaints or concerns.   ROS: Limited due to cognition, however denies CP, SOB, abd pain, n/v/d.    Objective:   DG Swallowing Func-Speech Pathology Result Date: 01/03/2024 Table formatting from the original result was not included. Modified Barium Swallow Study Patient Details Name: [name] MRN: [id number] Date of Birth: [birth year] Today's Date: 01/03/2024 HPI/PMH: HPI: Patient is an 83 y.o. female with PMH: Alzheimer's and vascular dementia, COPD, osteoporosis, severe aortic stenosis s/p TAVR. She was admitted to [facility] on 12/21/23 with intermittent confusion. At baseline, she lives with her daughter but is able to complete basic level ADL's and is home alone during the day while daughter is at work. MRI brain showed small punctate left frontal CVA. Patient with  fall while admitted, resulting in right femoral neck fracture s/p THA on 12/21/23. Clinical Impression: Patient presents with mild oropharyngeal dysphagia. Oral phase is characterized by reduced lingual control/coordination and bolus prep/mastication resulting in prolonged mastication of solids and instance of posterior spillage of oral residuals resulting in penetration to the vocal cords (spontaneously cleared - PAS 4). Patient benefits from liquid wash and double swallows to clear lingual residuals. Pharyngeal phase is remarkable for limited epiglottic inversion, incomplete laryngeal vestibular closure and tongue base retraction. These deficits resulted in consistent flash penetration of thin liquids and pharyngeal residuals present on the BOT and vallecula. No other instances of penetration/aspiration present during remaining consistencies despite patient with consistent throat clears throughout study. During  consumption of pill in puree, patient required multiple bites to clear from oral cavity. Recommend D3/thin liquids with medications whole vs crushed in puree. Patient should receive full supervision during mealtimes to cue for compensatory strategies due to patients cognitive deficits. DIGEST Swallow Severity Rating*  Safety: 1  Efficiency:1  Overall Pharyngeal Swallow Severity: mild 1: mild; 2: moderate; 3: severe; 4: profound *The Dynamic Imaging Grade of Swallowing Toxicity is standardized for the head and neck cancer population, however, demonstrates promising clinical applications across populations to standardize the clinical rating of pharyngeal swallow safety and severity. Factors that may increase risk of adverse event in presence of aspiration Roderick Civatte & Jessy Morocco 2021): Factors that may increase risk of adverse event in presence of aspiration Roderick Civatte & Jessy Morocco 2021): Frail or deconditioned; Reduced cognitive function Recommendations/Plan: Swallowing Evaluation Recommendations Swallowing Evaluation Recommendations Recommendations: PO diet PO Diet Recommendation: Dysphagia 3 (Mechanical soft); Thin liquids (Level 0) Liquid Administration via: Straw; Cup Medication Administration: Crushed with puree Supervision: Full supervision/cueing for swallowing strategies; Staff to assist with self-feeding Swallowing strategies  : Minimize environmental distractions; Slow rate; Small bites/sips Postural changes: Position pt fully upright for meals Oral care recommendations: Oral care BID (2x/day) Treatment Plan Treatment Plan Treatment recommendations: Therapy as outlined in treatment plan below Follow-up recommendations: Home health SLP Functional status assessment: Patient has had a recent decline in their functional status and demonstrates the ability to make significant improvements in function in a reasonable and predictable amount of time. Treatment frequency: Min 2x/week Treatment duration: 2 weeks Interventions:  Aspiration  precaution training; Oropharyngeal exercises; Patient/family education; Diet toleration management by SLP; Compensatory techniques Recommendations Recommendations for follow up therapy are one component of a multi-disciplinary discharge planning process, led by the attending physician.  Recommendations may be updated based on patient status, additional functional criteria and insurance authorization. Assessment: Orofacial Exam: Orofacial Exam Oral Cavity: Oral Hygiene: WFL Oral Cavity - Dentition: Adequate natural dentition Orofacial Anatomy: WFL Anatomy: Anatomy: Prominent cricopharyngeus Boluses Administered: Boluses Administered Boluses Administered: Thin liquids (Level 0); Puree; Solid  Oral Impairment Domain: Oral Impairment Domain Lip Closure: No labial escape Tongue control during bolus hold: Cohesive bolus between tongue to palatal seal Bolus preparation/mastication: Slow prolonged chewing/mashing with complete recollection Bolus transport/lingual motion: Slow tongue motion Oral residue: Residue collection on oral structures Location of oral residue : Tongue; Palate Initiation of pharyngeal swallow : -- (x1 initiation at vocal cords due to posterior spillage of oral residuals)  Pharyngeal Impairment Domain: Pharyngeal Impairment Domain Soft palate elevation: No bolus between soft palate (SP)/pharyngeal wall (PW) Laryngeal elevation: Partial superior movement of thyroid  cartilage/partial approximation of arytenoids to epiglottic petiole Anterior hyoid excursion: Partial anterior movement Epiglottic movement: -- (limited inversion) Laryngeal vestibule closure: Incomplete, narrow column air/contrast in laryngeal vestibule Pharyngeal stripping wave : Present - diminished Pharyngeal contraction (A/P view only): N/A Pharyngoesophageal segment opening: Partial distention/partial duration, partial obstruction of flow (CP Bar) Tongue base retraction: Trace column of contrast or air between tongue base and  PPW Pharyngeal residue: Collection of residue within or on pharyngeal structures Location of pharyngeal residue: Valleculae; Tongue base  Esophageal Impairment Domain: No data recorded Pill: Pill Consistency administered: Puree Puree: WFL (required multiple bites of puree) Penetration/Aspiration Scale Score: Penetration/Aspiration Scale Score 1.  Material does not enter airway: Puree; Solid; Pill 4.  Material enters airway, CONTACTS cords then ejected out: Thin liquids (Level 0) Compensatory Strategies: Compensatory Strategies Compensatory strategies: Yes Multiple swallows: Effective Effective Multiple Swallows: Puree; Solid; Pill Liquid wash: Effective Effective Liquid Wash: Puree; Solid   General Information: No data recorded Diet Prior to this Study: Dysphagia 3 (mechanical soft); Thin liquids (Level 0)   No data recorded  Respiratory Status: WFL   Supplemental O2: None (Room air)   No data recorded Behavior/Cognition: Alert; Cooperative Self-Feeding Abilities: Needs assist with self-feeding No data recorded Volitional Cough: Able to elicit Volitional Swallow: Able to elicit Exam Limitations: No limitations Goal Planning: Prognosis for improved oropharyngeal function: Guarded Barriers to Reach Goals: Cognitive deficits No data recorded No data recorded Consulted and agree with results and recommendations: Patient; Nurse Pain: No data recorded End of Session: Start Time:No data recorded Stop Time: No data recorded Time Calculation:No data recorded Charges: No data recorded SLP visit diagnosis: SLP Visit Diagnosis: Dysphagia, oropharyngeal phase (R13.12) Past Medical History: Past Medical History: Diagnosis Date  Cataract   COPD (chronic obstructive pulmonary disease) (HCC)   Glaucoma   Goiter   Hypertension 02/21/2018  Hypothyroid   Memory loss   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 Abel Hoe & Dr. Honey Lusty  Scoliosis   Severe aortic stenosis   Wears glasses  Past Surgical History: Past Surgical History: Procedure Laterality Date  CESAREAN SECTION  07/31/1976  TWINS, ONE WAS STILLBORN  INTRAOPERATIVE TRANSTHORACIC ECHOCARDIOGRAM N/A 11/20/2023  Procedure: ECHOCARDIOGRAM, TRANSTHORACIC;  Surgeon: Odie Benne, MD;  Location: [facility] INVASIVE CV LAB;  Service: Cardiovascular;  Laterality: N/A;  IR IVC FILTER PLMT / S&I /IMG GUID/MOD SED  12/31/2023  MOUTH SURGERY  07/31/1994  tongue resection  NODULE REMOVED    BENIGN, LOWER NECK  RIGHT HEART CATH AND CORONARY ANGIOGRAPHY N/A 09/21/2023  Procedure: RIGHT HEART CATH AND CORONARY ANGIOGRAPHY;  Surgeon: Odie Benne, MD;  Location: [facility] INVASIVE CV LAB;  Service: Cardiovascular;  Laterality: N/A;  TOTAL HIP ARTHROPLASTY Right 12/21/2023  Procedure: ARTHROPLASTY, HIP, TOTAL, ANTERIOR APPROACH;  Surgeon: Laneta Pintos, MD;  Location: [facility] OR;  Service: Orthopedics;  Laterality: Right;  HEMI HIP Cassidi Sockwell M.A., CCC-SLP 01/03/2024, 9:37 AM   Recent Labs    01/03/24 0555  WBC 7.8  HGB 9.6*  HCT 29.2*  PLT 434*   Recent Labs    01/03/24 0555  NA 134*  K 3.8  CL 103  CO2 23  GLUCOSE 110*  BUN 21  CREATININE 1.04*  CALCIUM  8.8*    Intake/Output Summary (Last 24 hours) at 01/05/2024 0832 Last data filed at 01/04/2024 1830 Gross per 24 hour  Intake 356 ml  Output --  Net 356 ml        Physical Exam: Vital Signs Blood pressure 138/72, pulse 100, temperature 98.3 F (36.8 C), temperature source Oral, resp. rate 18, height 5\' 6"  (1.676 m), weight 57.5 kg, SpO2 96%.   General: No acute distress sitting up in w/c eating breakfast.  Mood and affect are a little flat but appropriate Heart: Regular rate and rhythm no rubs murmurs or extra sounds appreciated Lungs: Clear to auscultation, breathing unlabored, no rales or wheezes Abdomen: Positive bowel sounds, soft nontender to palpation,  nondistended Extremities: No clubbing, cyanosis, or edema Neuro: minimal dysarthria  PRIOR EXAMS: HEENT - mod swelling Right internal jugular area, mild/mod tenderess around 2x2 dressing no blood on dressing  RIght eye medial subconjunctival hemorrhage Neuro:  alert , speech with minimal dysarthria  Musculoskeletal: Right thigh with swelling, post op dressing intact , no tenderness to palp  4-/5 BLE 4/5 BUE Tone is normal in UE   Assessment/Plan: 1. Functional deficits which require 3+ hours per day of interdisciplinary therapy in a comprehensive inpatient rehab setting. Physiatrist is providing close team supervision and 24 hour management of active medical problems listed below. Physiatrist and rehab team continue to assess barriers to discharge/monitor patient progress toward functional and medical goals  Care Tool:  Bathing    Body parts bathed by patient: Right arm, Left arm, Chest, Abdomen, Right upper leg, Left upper leg, Face, Front perineal area   Body parts bathed by helper: Buttocks, Left lower leg, Right lower leg     Bathing assist Assist Level: Moderate Assistance - Patient 50 - 74%     Upper Body Dressing/Undressing Upper body dressing   What is the patient wearing?: Pull over shirt    Upper body assist Assist Level: Minimal Assistance - Patient > 75%    Lower Body Dressing/Undressing Lower body dressing      What is the patient wearing?: Underwear/pull up, Pants     Lower body assist Assist for lower body dressing: Maximal Assistance - Patient 25 - 49%     Toileting Toileting    Toileting assist Assist for toileting: Supervision/Verbal cueing     Transfers Chair/bed transfer  Transfers assist     Chair/bed transfer assist level: Supervision/Verbal cueing     Locomotion Ambulation   Ambulation assist      Assist level: Contact Guard/Touching assist Assistive device: Walker-rolling Max  distance: 165   Walk 10 feet  activity   Assist     Assist level: Contact Guard/Touching assist Assistive device: Walker-rolling   Walk 50 feet activity   Assist Walk 50 feet with 2 turns activity did not occur: Safety/medical concerns  Assist level: Contact Guard/Touching assist Assistive device: Walker-rolling    Walk 150 feet activity   Assist Walk 150 feet activity did not occur: Safety/medical concerns  Assist level: Contact Guard/Touching assist Assistive device: Walker-rolling    Walk 10 feet on uneven surface  activity   Assist Walk 10 feet on uneven surfaces activity did not occur: Safety/medical concerns   Assist level: Contact Guard/Touching assist Assistive device: Walker-rolling   Wheelchair     Assist Is the patient using a wheelchair?: Yes Type of Wheelchair: Manual    Wheelchair assist level: Dependent - Patient 0% Max wheelchair distance: 15 ft    Wheelchair 50 feet with 2 turns activity    Assist    Wheelchair 50 feet with 2 turns activity did not occur: Safety/medical concerns       Wheelchair 150 feet activity     Assist  Wheelchair 150 feet activity did not occur: Safety/medical concerns       Blood pressure 138/72, pulse 100, temperature 98.3 F (36.8 C), temperature source Oral, resp. rate 18, height 5\' 6"  (1.676 m), weight 57.5 kg, SpO2 96%.  Medical Problem List and Plan: 1. 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  Antithrombotics: -DVT/anticoagulation:  Mechanical: Antiembolism stockings, thigh (TED hose) Bilateral lower extremities -12/30/23 DVT U/S yesterday showing age indeterminate DVTs of RLE  mid to distal femoral vein, IVC filter placed Dr Marlena Sima 6/2 Will keep HOB elevated 30 deg x 2 hr, may resume therapy this afternoon  -antiplatelet therapy: Aspirin  81 mg daily and Plavix  75 mg daily x 3 weeks then Plavix  alone.started 5/31 3. Pain Management: Oxycodone  as needed 4. Mood/Behavior/Sleep/dementia: Namenda  5 mg nightly, melatonin 5 mg nightly as needed             -antipsychotic agents: N/A             -Continue Telesitter for poor safety awareness Now awake and alert 6/5 5. Neuropsych/cognition: This patient is not capable of making decisions on her own behalf. 6. Skin/Wound Care: Routine skin checks 7. Fluids/Electrolytes/Nutrition: pt has reasonable appetite it appears but cognition might interfere with adequate follow through during meals RD consult  8.  Right displaced femoral neck fracture.  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 Curtiss Dowdy.  WBAT RLE with hip precautions 9.  Acute blood loss anemia/rectal bleeding.  Transfuse 1 unit packed red blood cells 5/26. 6/2 Hgb stable     Plavix  restarted 5/31  Mild drop in Hgb no overt rectal bleeding reported suspect small post procedure hematoma R internal jugular area    Latest Ref Rng & Units 01/03/2024    5:55 AM 12/31/2023    5:06 AM 12/29/2023    4:56  AM  CBC  WBC 4.0 - 10.5 K/uL 7.8  5.6    Hemoglobin 12.0 - 15.0 g/dL 9.6  40.9  81.1   Hematocrit 36.0 - 46.0 % 29.2  33.6  31.8   Platelets 150 - 400 K/uL 434  384      11.  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  Urine sodium 97,serum osmo 366 -Na+ stable  -tighten Fluid restriction to 1200 cc.   - continue P.o. sodium chloride  1g BID    Latest Ref Rng & Units 01/03/2024    5:55 AM 12/31/2023    5:06 AM 12/29/2023    4:56 AM  BMP  Glucose 70 - 99 mg/dL 914  782  956   BUN 8 - 23 mg/dL 21  20  22    Creatinine 0.44 - 1.00 mg/dL 2.13  0.86  5.78   Sodium 135 - 145 mmol/L 134  130  130   Potassium 3.5 - 5.1 mmol/L 3.8  4.0  3.8   Chloride 98 - 111 mmol/L 103  100   94   CO2 22 - 32 mmol/L 23  21  23    Calcium  8.9 - 10.3 mg/dL 8.8  8.5  9.2     12.  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  Follow-up cardiology services 13.  History of oral cancer with tongue resection.  Follow-up outpatient [facility] 14.  Glaucoma.  Continue eyedrops as indicated 15.  Hypertension.  Toprol -XL 100 mg daily.  Norvasc  5mg  daily. Monitor with increased mobility  -5/31-6/7 BPs fine, monitor Vitals:   01/01/24 1428 01/01/24 1954 01/02/24 0504 01/02/24 1427  BP: (!) 118/58 126/64 114/65 131/69   01/02/24 1958 01/03/24 0528 01/03/24 1653 01/03/24 2020  BP: (!) 115/56 133/64 (!) 118/59 127/65   01/04/24 0554 01/04/24 1531 01/04/24 1937 01/05/24 0525  BP: (!) 133/53 126/62 (!) 116/55 138/72    16.  Hyperlipidemia.  Crestor  5mg  daily 17.  Hypothyroidism.  Synthroid  75mcg daily 18.  Constipation.  Senokot S1 tablet twice daily as well as Dulcolax suppository   -01/05/24 LBM today, monitor  LOS: 9 days A FACE TO FACE EVALUATION WAS PERFORMED  [address] 01/05/2024, 8:32 AM

## 2024-01-05 NOTE — Progress Notes (Signed)
 Physical Therapy Session Note  Patient Details  Name: [name] MRN: [id number] Date of Birth: 03-17-1941  Today's Date: 01/05/2024 PT Individual Time: 1310-1405 PT Individual Time Calculation (min): 55 min   Short Term Goals: Week 1:  PT Short Term Goal 1 (Week 1): Pt will perform bed mobility with increased speed of initiation and overall CGA PT Short Term Goal 1 - Progress (Week 1): Met PT Short Term Goal 2 (Week 1): Pt will perform standing transfers with CGA using LRAD. PT Short Term Goal 2 - Progress (Week 1): Met PT Short Term Goal 3 (Week 1): Pt will ambulate at least 50 ft using LRAD with CGA. PT Short Term Goal 3 - Progress (Week 1): Met PT Short Term Goal 4 (Week 1): Pt will initiate stair training. PT Short Term Goal 4 - Progress (Week 1): Met PT Short Term Goal 5 (Week 1): Pt will complete TUG for outcome measure. PT Short Term Goal 5 - Progress (Week 1): Met  Skilled Therapeutic Interventions/Progress Updates: Patient sitting in [facility] with family present on entrance to room. Patient alert and agreeable to PT session.   Patient with some soreness in R hip at surgical site. Pt and family further educated on pt to decrease WB on RW to avoid pushing it out, and especially when turning with demonstration provided. Pt and family waiting for SNF placement currently.  Therapeutic Activity: Transfers: Pt performed sit<>stand transfers throughout session with RW and with VC to ensure back of knees touch sitting surface, and to maintain safe proximity to RW.  - Pt ambulated from room<>day room gym in RW with supervision and VC to increase B step length throughout and to maintain safe proximity to RW - Ambulating very short distances with pt's B UE supported on PTA forearm (side step, anterior steps) with cues to increase step length/clearance with overall light minA. Pt progressed to only one UE supported on PTA forearm.  TUG (avg = 42.28s) with RW and supervision. Pt further  educated at end to decrease WB on RW, and to turn RW vs sliding it (pt ambulated around day room following in RW with cues to turn RW safely vs sliding out to L or R to avoid hugging side of RW).  - 43.2  - 42.33  - 41.3  Patient sitting in WC at end of session with brakes locked, daughter present, belt alarm set, and all needs within reach.      Therapy Documentation Precautions:  Precautions Precautions: Fall Precaution/Restrictions Comments: Alz/Dementia Restrictions Weight Bearing Restrictions Per Provider Order: Yes RLE Weight Bearing Per Provider Order: Weight bearing as tolerated  Therapy/Group: Individual Therapy  Reshonda Koerber PTA 01/05/2024, 3:06 PM

## 2024-01-06 DIAGNOSIS — I639 Cerebral infarction, unspecified: Secondary | ICD-10-CM | POA: Diagnosis not present

## 2024-01-06 DIAGNOSIS — K5901 Slow transit constipation: Secondary | ICD-10-CM | POA: Diagnosis not present

## 2024-01-06 DIAGNOSIS — I10 Essential (primary) hypertension: Secondary | ICD-10-CM | POA: Diagnosis not present

## 2024-01-06 NOTE — Progress Notes (Signed)
 Speech Language Pathology Daily Session Note  Patient Details  Name: [name] MRN: [id number] Date of Birth: Jul 27, 1941  Today's Date: 01/06/2024 SLP Individual Time: 0900-1000 SLP Individual Time Calculation (min): 60 min  Short Term Goals: Week 2: SLP Short Term Goal 1 (Week 2): STG = LTG due to elos  Skilled Therapeutic Interventions:  Pt seen for ST session targeting cognition and dysphagia goals. Pt eating her breakfast of D3 scrambled eggs with sausage and thin liquids upon SLP arrival but required cueing to encourage eating as she was mostly moving food around the plate. When she did eat, mastication was slow but thorough. No overt s/sx of aspiration observed. Pocketing in L buccal cavity with eggs (pt sensate to it), cleared with cued lingual sweep and liquid wash. During session, Nursing administered meds crushed in pudding and one whole in pudding. No difficulty noted with crushed meds; however, poor A-P bolus transit noted with whole pill with pudding. She required thin liquid water via straw to clear.   Pt oriented to place but not situation, month, or year. She was able to use an external memory aid (calendar) to orient to month, year, DOW, and date when provided min A. Pt recalled address IND when provided increased wait time and home phone number with 0% acc IND, improving to 100% acc up to 8 minutes with use of spaced retrieval technique (SRT). Pt recalled reason for admission with 0% acc IND, improved to 50% acc up to 5 minutes with SRT. Pt left in bed with alarm active and call bell in reach. Continue ST POC.   Pain Pain Assessment Pain Scale: 0-10 Pain Score: 0-No pain Faces Pain Scale: No hurt  Therapy/Group: Individual Therapy  Caretha Chapel, MA CCC-SLP Speech-Language Pathologist 01/06/2024, 9:53 AM

## 2024-01-06 NOTE — Plan of Care (Signed)
  Problem: RH BOWEL ELIMINATION Goal: RH STG MANAGE BOWEL WITH ASSISTANCE Description: STG Manage Bowel with toileting Assistance. Outcome: Progressing Goal: RH STG MANAGE BOWEL W/MEDICATION W/ASSISTANCE Description: STG Manage Bowel with Medication with mod I Assistance. Outcome: Progressing   Problem: RH SAFETY Goal: RH STG ADHERE TO SAFETY PRECAUTIONS W/ASSISTANCE/DEVICE Description: STG Adhere to Safety Precautions With cues Assistance/Device. Outcome: Progressing

## 2024-01-06 NOTE — Progress Notes (Signed)
 Physical Therapy Session Note  Patient Details  Name: [name] MRN: [id number] Date of Birth: 05/10/1941  Today's Date: 01/06/2024 PT Individual Time: 1115-1200 PT Individual Time Calculation (min): 45 min   Short Term Goals: Week 2:  PT Short Term Goal 1 (Week 2): STG = LTG  Skilled Therapeutic Interventions/Progress Updates: Pt presents semi-reclined asleep , awakened and agreeable to therapy.  Pt transfers sup to sit w/ supervision and scoots to EOB.  Pt transfers sit to stand w/ sup and cues for hand placement.  Pt amb x 100' into dayroom w/ RW and supervision, cues for posture and foot clearance.  Pt amb 170', and 130' w/ RW and supervision, cues for directions and maintaining position w/ in RW.  Pt negotiated cone obstacle course w/ same and cues for walker management.  Pt returned to room and sat EOB while PT preparing w/c.  Pt transferred bed > w/c w/ RW and supervision, increased cues in confined space.  Family present in room and appreciative of therapy.  Chair alarm on and all needs in reach.     Therapy Documentation Precautions:  Precautions Precautions: Fall Precaution/Restrictions Comments: Alz/Dementia Restrictions Weight Bearing Restrictions Per Provider Order: Yes RLE Weight Bearing Per Provider Order: Weight bearing as tolerated General:   Vital Signs:   Pain:0/10 Pain Assessment Pain Scale: 0-10 Pain Score: 0-No pain Faces Pain Scale: No hurt    Therapy/Group: Individual Therapy  Moise Friday P Sharrod Achille 01/06/2024, 12:03 PM

## 2024-01-06 NOTE — Progress Notes (Signed)
 Occupational Therapy Session Note  Patient Details  Name: [name] MRN: [id number] Date of Birth: November 23, 1940  Today's Date: 01/06/2024 OT Individual Time: 1400-1500 OT Individual Time Calculation (min): 60 min    Short Term Goals: Week 1:  OT Short Term Goal 1 (Week 1): Pt will be able to don pants with min A using AE as needed OT Short Term Goal 1 - Progress (Week 1): Met OT Short Term Goal 2 (Week 1): Pt will be able to self cleanse post toileting with min A. OT Short Term Goal 2 - Progress (Week 1): Met OT Short Term Goal 3 (Week 1): Pt will sit to stand with CGA at [facility]. OT Short Term Goal 3 - Progress (Week 1): Met Week 2:  OT Short Term Goal 1 (Week 2): STG= LTGs Week 3:     Skilled Therapeutic Interventions/Progress Updates:    1:1 Pt received in the w/c. Pt reports feeling fatigued this afternoon especially with moving. BP taken multiple time and was all over the board 110s/50s to 80/54 to 124/68- RN aware. Pt donned left sheet but required A to don right shoes- reports it hurts in her groin with trying to don it.   Pt stood with cues for proper hand placement. Marched in place with and without UE support, then ambulated to the dayroom with cues for RW safety and to take a bigger step with left foot. PT also requires cues for safety RW use with turning to remain inside of it. After seated rest break on the mat- pt performed sit to stands without RW and then performed dynamic balance activity of stepping on colored circles in 3 places slightly outside BOS- requested her to alternating stepping on each one without UE support.   Pt then performed bilateral alternating toe tapping on the 6 inch step without UE support with contact guard for safety. Pt then reported needing to go to the bathroom. PT ambulated to the bathroom and performed toileting with instructional cues and encouragement to perform 3/3 steps with contact guard. Again pt benefited from cues for RW safety with turns.    Pt ambulated back to room and was able to get both LEs in the bed with extra time with supervision.   Pt left resting in the bed with bed alarm on medium setting with call bell in hand.   Therapy Documentation Precautions:  Precautions Precautions: Fall Precaution/Restrictions Comments: Alz/Dementia Restrictions Weight Bearing Restrictions Per Provider Order: Yes RLE Weight Bearing Per Provider Order: Weight bearing as tolerated  Pain: Pain Assessment Pain Scale: 0-10 Pain Score: 0-No pain Faces Pain Scale: No hurt   Therapy/Group: Individual Therapy  Henrene Locust [facility] 01/06/2024, 3:56 PM

## 2024-01-06 NOTE — Progress Notes (Signed)
 PROGRESS NOTE   Subjective/Complaints:  Pt doing ok again, slept ok, denies pain. Unsure of LBM but looks like yesterday. Urinating fine. Denies any other complaints or concerns.   ROS: Limited due to cognition, however denies CP, SOB, abd pain, n/v/d.    Objective:   No results found.   No results for input(s): "WBC", "HGB", "HCT", "PLT" in the last 72 hours.  No results for input(s): "NA", "K", "CL", "CO2", "GLUCOSE", "BUN", "CREATININE", "CALCIUM " in the last 72 hours.  No intake or output data in the 24 hours ending 01/06/24 1125       Physical Exam: Vital Signs Blood pressure 138/65, pulse 86, temperature 97.8 F (36.6 C), temperature source Oral, resp. rate 18, height 5\' 6"  (1.676 m), weight 57.5 kg, SpO2 95%.   General: No acute distress sitting up in bed eating breakfast, SLP at bedside.  Eye: Right eye medial subconjunctival hemorrhage Mood and affect are a little flat but appropriate Heart: Regular rate and rhythm no rubs murmurs or extra sounds appreciated Lungs: Clear to auscultation, breathing unlabored, no rales or wheezes Abdomen: Positive bowel sounds, soft nontender to palpation, nondistended Extremities: No clubbing, cyanosis, or edema Neuro: minimal dysarthria  PRIOR EXAMS: HEENT - mod swelling Right internal jugular area, mild/mod tenderess around 2x2 dressing no blood on dressing  Neuro:  alert , speech with minimal dysarthria  Musculoskeletal: Right thigh with swelling, post op dressing intact , no tenderness to palp  4-/5 BLE 4/5 BUE Tone is normal in UE   Assessment/Plan: 1. Functional deficits which require 3+ hours per day of interdisciplinary therapy in a comprehensive inpatient rehab setting. Physiatrist is providing close team supervision and 24 hour management of active medical problems listed below. Physiatrist and rehab team continue to assess barriers to discharge/monitor  patient progress toward functional and medical goals  Care Tool:  Bathing    Body parts bathed by patient: Right arm, Left arm, Chest, Abdomen, Right upper leg, Left upper leg, Face, Front perineal area, Buttocks   Body parts bathed by helper: Right lower leg, Left lower leg     Bathing assist Assist Level: Minimal Assistance - Patient > 75%     Upper Body Dressing/Undressing Upper body dressing   What is the patient wearing?: Pull over shirt, Bra    Upper body assist Assist Level: Supervision/Verbal cueing    Lower Body Dressing/Undressing Lower body dressing      What is the patient wearing?: Underwear/pull up, Pants     Lower body assist Assist for lower body dressing: Minimal Assistance - Patient > 75%     Toileting Toileting    Toileting assist Assist for toileting: Supervision/Verbal cueing     Transfers Chair/bed transfer  Transfers assist     Chair/bed transfer assist level: Supervision/Verbal cueing     Locomotion Ambulation   Ambulation assist      Assist level: Supervision/Verbal cueing Assistive device: Walker-rolling Max distance: 150   Walk 10 feet activity   Assist     Assist level: Supervision/Verbal cueing Assistive device: Walker-rolling   Walk 50 feet activity   Assist Walk 50 feet with 2 turns activity did not occur: Safety/medical concerns  Assist level: Supervision/Verbal cueing Assistive device: Walker-rolling    Walk 150 feet activity   Assist Walk 150 feet activity did not occur: Safety/medical concerns  Assist level: Supervision/Verbal cueing Assistive device: Walker-rolling    Walk 10 feet on uneven surface  activity   Assist Walk 10 feet on uneven surfaces activity did not occur: Safety/medical concerns   Assist level: Supervision/Verbal cueing Assistive device: Walker-rolling   Wheelchair     Assist Is the patient using a wheelchair?: Yes Type of Wheelchair: Manual    Wheelchair assist  level: Dependent - Patient 0% Max wheelchair distance: 15 ft    Wheelchair 50 feet with 2 turns activity    Assist    Wheelchair 50 feet with 2 turns activity did not occur: Safety/medical concerns       Wheelchair 150 feet activity     Assist  Wheelchair 150 feet activity did not occur: Safety/medical concerns       Blood pressure 138/65, pulse 86, temperature 97.8 F (36.6 C), temperature source Oral, resp. rate 18, height 5\' 6"  (1.676 m), weight 57.5 kg, SpO2 95%.  Medical Problem List and Plan: 1. 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  Antithrombotics: -DVT/anticoagulation:  Mechanical: Antiembolism stockings, thigh (TED hose) Bilateral lower extremities -12/30/23 DVT U/S yesterday showing age indeterminate DVTs of RLE mid to distal femoral vein, IVC filter placed Dr Marlena Sima 6/2 Will keep HOB elevated 30 deg x 2 hr, may resume therapy this afternoon  -antiplatelet therapy: Aspirin  81 mg daily and Plavix  75 mg daily x 3 weeks then Plavix  alone.started 5/31 3. Pain Management: Oxycodone  as needed 4. Mood/Behavior/Sleep/dementia: Namenda  5 mg nightly, melatonin 5 mg nightly as needed             -antipsychotic agents: N/A             -Continue Telesitter for poor safety awareness Now awake and alert 6/5 5. Neuropsych/cognition: This patient is not capable of making decisions on her own behalf. 6. Skin/Wound Care: Routine skin checks 7. Fluids/Electrolytes/Nutrition: pt has reasonable appetite it appears but cognition might interfere with adequate follow through during meals RD consult  8.  Right displaced femoral neck  fracture.  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 Curtiss Dowdy.  WBAT RLE with hip precautions 9.  Acute blood loss anemia/rectal bleeding.  Transfuse 1 unit packed red blood cells 5/26. 6/2 Hgb stable     Plavix  restarted 5/31  Mild drop in Hgb no overt rectal bleeding reported suspect small post procedure hematoma R internal jugular area    Latest Ref Rng & Units 01/03/2024    5:55 AM 12/31/2023    5:06 AM 12/29/2023    4:56 AM  CBC  WBC 4.0 - 10.5 K/uL 7.8  5.6    Hemoglobin 12.0 - 15.0 g/dL 9.6  16.1  09.6   Hematocrit 36.0 - 46.0 % 29.2  33.6  31.8   Platelets 150 - 400 K/uL 434  384  11.  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  Urine sodium 97,serum osmo 366 -Na+ stable  -tighten Fluid restriction to 1200 cc.   - continue P.o. sodium chloride  1g BID    Latest Ref Rng & Units 01/03/2024    5:55 AM 12/31/2023    5:06 AM 12/29/2023    4:56 AM  BMP  Glucose 70 - 99 mg/dL 540  981  191   BUN 8 - 23 mg/dL 21  20  22    Creatinine 0.44 - 1.00 mg/dL 4.78  2.95  6.21   Sodium 135 - 145 mmol/L 134  130  130   Potassium 3.5 - 5.1 mmol/L 3.8  4.0  3.8   Chloride 98 - 111 mmol/L 103  100  94   CO2 22 - 32 mmol/L 23  21  23    Calcium  8.9 - 10.3 mg/dL 8.8  8.5  9.2     12.  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  Follow-up cardiology services 13.  History of oral cancer with tongue resection.  Follow-up outpatient [facility] 14.  Glaucoma.  Continue eyedrops as indicated 15.  Hypertension.  Toprol -XL 100 mg daily.  Norvasc  5mg  daily. Monitor with increased mobility  -5/31-6/8 BPs fine, monitor Vitals:   01/02/24 1427 01/02/24 1958 01/03/24 0528 01/03/24 1653  BP: 131/69 (!) 115/56 133/64 (!) 118/59   01/03/24 2020 01/04/24 0554 01/04/24 1531 01/04/24 1937  BP: 127/65 (!) 133/53 126/62 (!) 116/55   01/05/24 0525 01/05/24 1420 01/05/24 1951 01/06/24 0349  BP: 138/72 124/63 123/70 138/65    16.  Hyperlipidemia.   Crestor  5mg  daily 17.  Hypothyroidism.  Synthroid  75mcg daily 18.  Constipation.  Senokot S1 tablet twice daily as well as Dulcolax suppository   -01/06/24 LBM yesterday, monitor  LOS: 10 days A FACE TO FACE EVALUATION WAS PERFORMED  [address] 01/06/2024, 11:25 AM

## 2024-01-07 DIAGNOSIS — K5900 Constipation, unspecified: Secondary | ICD-10-CM

## 2024-01-07 DIAGNOSIS — D649 Anemia, unspecified: Secondary | ICD-10-CM

## 2024-01-07 DIAGNOSIS — E871 Hypo-osmolality and hyponatremia: Secondary | ICD-10-CM | POA: Diagnosis not present

## 2024-01-07 DIAGNOSIS — I10 Essential (primary) hypertension: Secondary | ICD-10-CM | POA: Diagnosis not present

## 2024-01-07 DIAGNOSIS — I639 Cerebral infarction, unspecified: Secondary | ICD-10-CM | POA: Diagnosis not present

## 2024-01-07 LAB — BASIC METABOLIC PANEL WITH GFR
Anion gap: 9 (ref 5–15)
BUN: 21 mg/dL (ref 8–23)
CO2: 24 mmol/L (ref 22–32)
Calcium: 9 mg/dL (ref 8.9–10.3)
Chloride: 104 mmol/L (ref 98–111)
Creatinine, Ser: 0.89 mg/dL (ref 0.44–1.00)
GFR, Estimated: >60 mL/min (ref >60)
Glucose, Bld: 101 mg/dL — ABNORMAL HIGH (ref 70–99)
Potassium: 3.8 mmol/L (ref 3.5–5.1)
Sodium: 137 mmol/L (ref 135–145)

## 2024-01-07 LAB — CBC
HCT: 30.4 % — ABNORMAL LOW (ref 36.0–46.0)
Hemoglobin: 9.9 g/dL — ABNORMAL LOW (ref 12.0–15.0)
MCH: 30 pg (ref 26.0–34.0)
MCHC: 32.6 g/dL (ref 30.0–36.0)
MCV: 92.1 fL (ref 80.0–100.0)
Platelets: 446 10*3/uL — ABNORMAL HIGH (ref 150–400)
RBC: 3.3 MIL/uL — ABNORMAL LOW (ref 3.87–5.11)
RDW: 14.1 % (ref 11.5–15.5)
WBC: 7.6 10*3/uL (ref 4.0–10.5)
nRBC: 0 % (ref 0.0–0.2)

## 2024-01-07 NOTE — Progress Notes (Signed)
 Patient ID: [name], female   DOB: 28-Nov-1940, 83 y.o.   MRN: [id number]   SW received phone call from pt son Sylvester Evert to discuss if follow-up from [name]. SW shared still waiting on a response if a bed is available.   [name], MSW, LCSW Office: [phone] Cell: [phone] Fax: [phone]

## 2024-01-07 NOTE — Progress Notes (Signed)
 Physical Therapy Session Note  Patient Details  Name: [name] MRN: [id number] Date of Birth: August 23, 1940  Today's Date: 01/07/2024 PT Individual Time: 0928-1000 PT Individual Time Calculation (min): 32 min  and Today's Date: 01/07/2024 PT Missed Time: 28 Minutes Missed Time Reason: Other (Comment) (eating breakfast)  Short Term Goals: Week 2:  PT Short Term Goal 1 (Week 2): STG = LTG   Skilled Therapeutic Interventions/Progress Updates:   Patient seated EOB on entrance to room. Patient alert and agreeable to PT session. PT discussed current event surrounding her discharge and that her son was looking for locations for her to discharge to.  Therapeutic Activity: Transfers: Pt performed sit<>stand and transfers throughout session with RW with supervision. No cueing required for proper technique.   Gait Training:  Pt ambulated 50 ft with RW without frequent cueing to stand up straight, look forward, and keep walker within a safe distance.   Wheelchair Mobility:  Pt propelled wheelchair 114ft with arms and legs with supervision. Pt. Also propelled the wheelchair around the PT to work on obstacle navigation.   Neuromuscular Re-ed: NMR facilitated during session with focus on dual tasking, standing dynamic balance, safety awareness while locating and picking up objects from different heights and locations in the room. The pt. Required CGA for picking up objects off the ground. The pt. had trouble remembering more than 1 objects location at a time.   NMR performed for improvements in motor control and coordination, balance, sequencing, judgement, and self confidence/ efficacy in performing all aspects of mobility at highest level of independence.    Patient ended the session lying in bed with bed alarm set, and all needs within reach.  Therapy Documentation Precautions:  Precautions Precautions: Fall Precaution/Restrictions Comments: Alz/Dementia Restrictions Weight Bearing  Restrictions Per Provider Order: Yes RLE Weight Bearing Per Provider Order: Weight bearing as tolerated General: PT Amount of Missed Time (min): 28 Minutes PT Missed Treatment Reason: Other (Comment) (eating breakfast) Vital Signs:   Pain:  No pain indicated by pt. this session      Therapy/Group: Individual Therapy  Pierre Dellarocco 01/07/2024, 10:05 AM

## 2024-01-07 NOTE — Progress Notes (Signed)
 Speech Language Pathology Daily Session Note  Patient Details  Name: [name] MRN: [id number] Date of Birth: Jan 24, 1941  Today's Date: 01/07/2024 SLP Individual Time: 1400-1430 SLP Individual Time Calculation (min): 30 min  Short Term Goals: Week 2: SLP Short Term Goal 1 (Week 2): STG = LTG due to elos  Skilled Therapeutic Interventions: Skilled therapy session focused on cognitive goals. SLP facilitated session by providing minA for patient to demonstrate orientation to location, situation and time using external aid. Patient independently able to recall name, age, birthday and address during "911" call scenario. Patient required min visual A to identify call bell and x2 uses. SLP provided additional external aid for call bell. Patient left in bed with alarm set and call bell in reach. Son present. Continue POC.  Pain None reported   Therapy/Group: Individual Therapy  Desha Bitner M.A., CCC-SLP 01/07/2024, 12:29 PM

## 2024-01-07 NOTE — Progress Notes (Incomplete)
 Physical Therapy Session Note  Patient Details  Name: [name] MRN: [id number] Date of Birth: 1941/04/26  {CHL IP REHAB PT TIME CALCULATION:304800500}  Short Term Goals: {WUJ:8119147}  Skilled Therapeutic Interventions/Progress Updates:      Therapy Documentation Precautions:  Precautions Precautions: Fall Precaution/Restrictions Comments: Alz/Dementia Restrictions Weight Bearing Restrictions Per Provider Order: Yes RLE Weight Bearing Per Provider Order: Weight bearing as tolerated General:   Vital Signs:   Pain:   Mobility:   Locomotion :    Trunk/Postural Assessment :    Balance:   Exercises:   Other Treatments:      Therapy/Group: {Therapy/Group:[id number]}  Donne Gage 01/07/2024, 9:25 AM

## 2024-01-07 NOTE — Progress Notes (Signed)
 PROGRESS NOTE   Subjective/Complaints:  Patient she slept okay.  Denies pain.  No additional concerns or complaints.  She does not recall when her last bowel movement was.  Documentation indicates she had BM today.  ROS: Limited due to cognition, however denies fever, CP, SOB, abd pain, n/v/d.    Objective:   No results found.   Recent Labs    01/07/24 0513  WBC 7.6  HGB 9.9*  HCT 30.4*  PLT 446*    Recent Labs    01/07/24 0513  NA 137  K 3.8  CL 104  CO2 24  GLUCOSE 101*  BUN 21  CREATININE 0.89  CALCIUM  9.0     Intake/Output Summary (Last 24 hours) at 01/07/2024 1425 Last data filed at 01/07/2024 0845 Gross per 24 hour  Intake 236 ml  Output --  Net 236 ml         Physical Exam: Vital Signs Blood pressure (!) 120/55, pulse 88, temperature 97.6 F (36.4 C), temperature source Oral, resp. rate 16, height 5\' 6"  (1.676 m), weight 57.5 kg, SpO2 97%.   General: No acute distress laying in the bed appears comfortable. Eye: Right eye medial subconjunctival hemorrhage Mood and affect are a little flat but appropriate Heart: Regular rate and rhythm no rubs murmurs or extra sounds appreciated Lungs: Clear to auscultation, breathing unlabored, no rales or wheezes Abdomen: Positive bowel sounds, soft nontender to palpation, nondistended Extremities: No clubbing, cyanosis, or edema  HEENT - mod swelling Right internal jugular area, mild/mod tenderess around 2x2 dressing no blood on dressing  Neuro:  alert , speech with minimal dysarthria  Musculoskeletal: Right thigh with swelling, post op dressing intact , no tenderness to palp  4-/5 BLE 4/5 BUE   Assessment/Plan: 1. Functional deficits which require 3+ hours per day of interdisciplinary therapy in a comprehensive inpatient rehab setting. Physiatrist is providing close team supervision and 24 hour management of active medical problems listed  below. Physiatrist and rehab team continue to assess barriers to discharge/monitor patient progress toward functional and medical goals  Care Tool:  Bathing    Body parts bathed by patient: Right arm, Left arm, Chest, Abdomen, Right upper leg, Left upper leg, Face, Front perineal area, Buttocks   Body parts bathed by helper: Right lower leg, Left lower leg     Bathing assist Assist Level: Minimal Assistance - Patient > 75%     Upper Body Dressing/Undressing Upper body dressing   What is the patient wearing?: Pull over shirt, Bra    Upper body assist Assist Level: Supervision/Verbal cueing    Lower Body Dressing/Undressing Lower body dressing      What is the patient wearing?: Underwear/pull up, Pants     Lower body assist Assist for lower body dressing: Minimal Assistance - Patient > 75%     Toileting Toileting    Toileting assist Assist for toileting: Supervision/Verbal cueing     Transfers Chair/bed transfer  Transfers assist     Chair/bed transfer assist level: Supervision/Verbal cueing     Locomotion Ambulation   Ambulation assist      Assist level: Supervision/Verbal cueing Assistive device: Walker-rolling Max distance: 170   Walk  10 feet activity   Assist     Assist level: Supervision/Verbal cueing Assistive device: Walker-rolling   Walk 50 feet activity   Assist Walk 50 feet with 2 turns activity did not occur: Safety/medical concerns  Assist level: Supervision/Verbal cueing Assistive device: Walker-rolling    Walk 150 feet activity   Assist Walk 150 feet activity did not occur: Safety/medical concerns  Assist level: Supervision/Verbal cueing Assistive device: Walker-rolling    Walk 10 feet on uneven surface  activity   Assist Walk 10 feet on uneven surfaces activity did not occur: Safety/medical concerns   Assist level: Supervision/Verbal cueing Assistive device: Walker-rolling   Wheelchair     Assist Is the  patient using a wheelchair?: Yes Type of Wheelchair: Manual    Wheelchair assist level: Dependent - Patient 0% Max wheelchair distance: 15 ft    Wheelchair 50 feet with 2 turns activity    Assist    Wheelchair 50 feet with 2 turns activity did not occur: Safety/medical concerns       Wheelchair 150 feet activity     Assist  Wheelchair 150 feet activity did not occur: Safety/medical concerns       Blood pressure (!) 120/55, pulse 88, temperature 97.6 F (36.4 C), temperature source Oral, resp. rate 16, height 5\' 6"  (1.676 m), weight 57.5 kg, SpO2 97%.  Medical Problem List and Plan: 1. 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  Antithrombotics: -DVT/anticoagulation:  Mechanical: Antiembolism stockings, thigh (TED hose) Bilateral lower extremities -12/30/23 DVT U/S yesterday showing age indeterminate DVTs of RLE mid to distal femoral vein, IVC filter placed Dr Marlena Sima 6/2 Will keep HOB elevated 30 deg x 2 hr, may resume therapy this afternoon  -antiplatelet therapy: Aspirin  81 mg daily and Plavix  75 mg daily x 3 weeks then Plavix  alone.started 5/31 3. Pain Management: Oxycodone  as needed 4. Mood/Behavior/Sleep/dementia: Namenda  5 mg nightly, melatonin 5 mg nightly as needed             -antipsychotic agents: N/A             -Continue Telesitter for poor safety awareness Now awake and alert 6/5 5. Neuropsych/cognition: This patient is not capable of making decisions on her own behalf. 6. Skin/Wound Care: Routine skin checks 7. Fluids/Electrolytes/Nutrition: pt has reasonable appetite it appears but  cognition might interfere with adequate follow through during meals RD consult  8.  Right displaced femoral neck fracture.  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 Curtiss Dowdy.  WBAT RLE with hip precautions 9.  Acute blood loss anemia/rectal bleeding.  Transfuse 1 unit packed red blood cells 5/26. 6/2 Hgb stable     Plavix  restarted 5/31  Mild drop in Hgb no overt rectal bleeding reported suspect small post procedure hematoma R internal jugular area 6/9 HGB stable 9.9    Latest Ref Rng & Units 01/07/2024    5:13 AM 01/03/2024    5:55 AM 12/31/2023  5:06 AM  CBC  WBC 4.0 - 10.5 K/uL 7.6  7.8  5.6   Hemoglobin 12.0 - 15.0 g/dL 9.9  9.6  16.1   Hematocrit 36.0 - 46.0 % 30.4  29.2  33.6   Platelets 150 - 400 K/uL 446  434  384     11.  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  Urine sodium 97,serum osmo 366 -Na+ stable  -tighten Fluid restriction to 1200 cc.   - continue P.o. sodium chloride  1g BID -6/9 NA improved to 137, continue  current regimen for now    Latest Ref Rng & Units 01/07/2024    5:13 AM 01/03/2024    5:55 AM 12/31/2023    5:06 AM  BMP  Glucose 70 - 99 mg/dL 096  045  409   BUN 8 - 23 mg/dL 21  21  20    Creatinine 0.44 - 1.00 mg/dL 8.11  9.14  7.82   Sodium 135 - 145 mmol/L 137  134  130   Potassium 3.5 - 5.1 mmol/L 3.8  3.8  4.0   Chloride 98 - 111 mmol/L 104  103  100   CO2 22 - 32 mmol/L 24  23  21    Calcium  8.9 - 10.3 mg/dL 9.0  8.8  8.5     12.  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  Follow-up cardiology services 13.  History of oral cancer with tongue resection.  Follow-[facility] 14.  Glaucoma.  Continue eyedrops as indicated 15.  Hypertension.  Toprol -XL 100 mg daily.  Norvasc  5mg  daily. Monitor with increased mobility  -5/31-6/9 BPs fine, monitor Vitals:   01/03/24 2020 01/04/24 0554 01/04/24 1531 01/04/24 1937  BP: 127/65 (!) 133/53 126/62 (!) 116/55   01/05/24 0525  01/05/24 1420 01/05/24 1951 01/06/24 0349  BP: 138/72 124/63 123/70 138/65   01/06/24 1300 01/06/24 1959 01/07/24 0337 01/07/24 1323  BP: (!) 106/49 129/65 129/63 (!) 120/55    16.  Hyperlipidemia.  Crestor  5mg  daily 17.  Hypothyroidism.  Synthroid  75mcg daily 18.  Constipation.  Senokot S1 tablet twice daily as well as Dulcolax suppository   -6/9 LBM today, continue to follow up  LOS: 11 days A FACE TO FACE EVALUATION WAS PERFORMED  Lylia Sand 01/07/2024, 2:25 PM

## 2024-01-07 NOTE — Progress Notes (Signed)
 Occupational Therapy Session Note  Patient Details  Name: [name] MRN: [id number] Date of Birth: 16-Jan-1941  Today's Date: 01/07/2024 OT Individual Time: 1020-1115 OT Individual Time Calculation (min): 55 min    Short Term Goals: Week 1:  OT Short Term Goal 1 (Week 1): Pt will be able to don pants with min A using AE as needed OT Short Term Goal 1 - Progress (Week 1): Met OT Short Term Goal 2 (Week 1): Pt will be able to self cleanse post toileting with min A. OT Short Term Goal 2 - Progress (Week 1): Met OT Short Term Goal 3 (Week 1): Pt will sit to stand with CGA at [facility]. OT Short Term Goal 3 - Progress (Week 1): Met  Skilled Therapeutic Interventions/Progress Updates:  Pt greeted supine in bed , pt agreeable to OT intervention.      Transfers/bed mobility/functional mobility:  Pt completed supine>sit MODI. Pt completed functional ambulation with Rw and supervision.  Therapeutic activity:  Pt completed standing task with pt instructed to stand during each turn during connect 4 game with a focus on recalling hand placement during sit>stand as tends to pull on Rw and fogets to reach back to w/c. Pt completed sit>stands with mostly supervision but MIN A as pt fatigued. Pt needed intermittent MIN cues for technique and hand placement. Pt completed x8 sit>stands during task.   Pt completed functional ambulation task with pt instructed to transport playing cards from one side to the other ( ~ 6 ft apart)  while navigating around obstacles on the floor to challenge safety awareness with RW. Pt needed MOD cues to take note of obstacles on floor and stay inside RW. Pt completed task with overall CGA.    ADLs:  Transfers: pt completed ambulatory toilet transfer with RW and supervision.  Toileting: pt completed 3/3 toileting tasks with Supervision, continent urine void.   Cognition: pt pleasantly confused during session but able to use external cue of calendar to problem solve correct  date. Pt did ask, "are we in [facility]?"                Ended session with pt supine in bed with all needs within reach and bed alarm activated.                    Therapy Documentation Precautions:  Precautions Precautions: Fall Precaution/Restrictions Comments: Alz/Dementia Restrictions Weight Bearing Restrictions Per Provider Order: Yes RLE Weight Bearing Per Provider Order: Weight bearing as tolerated  Pain: no pain    Therapy/Group: Individual Therapy  Willadean Hark 01/07/2024, 12:20 PM

## 2024-01-08 DIAGNOSIS — I639 Cerebral infarction, unspecified: Secondary | ICD-10-CM | POA: Diagnosis not present

## 2024-01-08 NOTE — Progress Notes (Addendum)
 Patient ID: [name], female   DOB: 04-07-1941, 83 y.o.   MRN: [id number]  SW spoke with [name]/Admissions with [name] to discuss referral. Reports she is able to accept referral. Requests assistance with auth.   1324- SW spoke with [name] C/[employer] [phone] option#3/f:[phone]) to discuss SNF auth. Ref #[id number]. SW faxed clinicals.  *SW later met with pt son [name] to discuss messae and inform on above updates. Reports they would like to discontinue pursue  SNF since they will have [employer] agency that will begin next week Monday/Tuesday. SW shared pt will not kiley stay here until Monday since pt was staying here for SNF placement and her discharge was actually today. Reports this will be shared with medical team, and will confirm d/c date.  SW will reorder DME. Family will confirm preferred H[facility]. SW waiting on update from medical team about d/c date.  SW reordered DME with [employer].   61- SW spoke with [name]/[employer] to cancel pre-auth request.   *SW received phone call from [name] reporting preferred HHA is [facility], and 2) [facility]. Reports the concern with pt leaving before Monday is that he is unable to  lift more than 10lbs, and sister is working, and other brother does not come in form [place] until Monday. SW shared concerns will be shared with medical team, and will follow-up once there is an answer.  SW sent HHPT/OT/SLP referral to [name]/[facility] and waiting on follow-up.   [name], MSW, LCSW Office: [phone] Cell: [phone] Fax: [phone]

## 2024-01-08 NOTE — Progress Notes (Signed)
 Physical Therapy Session Note  Patient Details  Name: [name] MRN: [id number] Date of Birth: [birth year]  Today's Date: 01/08/2024 PT Individual Time: 1315-1346 PT Individual Time Calculation (min): 31 min   Short Term Goals: Week 2:  PT Short Term Goal 1 (Week 2): STG = LTG  Skilled Therapeutic Interventions/Progress Updates: Patient was sitting at the nursing station after finishing lunch and agreed to start therapy session earlier than scheduled. Pt. Reports feeling and confused about why she was in the hospital because she was not in any pain.   Therapeutic Activity: Transfers: Pt performed sit<>stand and without RW for transfers throughout session with CGA.  The pt. did not need any verbal cues for technique but did require cues to remember the task.   Gait Training:  Pt ambulated ~200 feet without RW with CGA. Pt. reported that she felt out of breath after walking ~71ft and was able to get stand up after a short break ~1 minute. Pt demonstrated the following gait deviations with therapist providing the described cuing and facilitation for improvement: Pt. loses balance for short periods of time if she turns her head while walking and talking.  Pt. Is able to self correct with a step strategy to prevent fall.   Neuromuscular Re-ed: NMR facilitated during session with focus on dynamic standing balance, reaction time and decision making while throwing and kicking a ball. Pt. did have a loss of balance that the SPT had to max A to prevent fall with gait belt. Pt. was able to kick, catch, and throw ball with CGA for 98% of session. Pt. Was reluctant to reach for balls out of BOS, although static balance was good during ball task if stationary.  -ball toss and catch (catching off bounce and out of air) -kicking ball being rolled to her  NMR performed for improvements in motor control and coordination, balance, sequencing, judgement, and self confidence/ efficacy in performing all aspects  of mobility at highest level of independence.      Patient sat in wc at end of session with brakes locked, chair alarm set, and all needs within reach.      Therapy Documentation Precautions:  Precautions Precautions: Fall Precaution/Restrictions Comments: Alz/Dementia Restrictions Weight Bearing Restrictions Per Provider Order: Yes RLE Weight Bearing Per Provider Order: Weight bearing as tolerated     Therapy/Group: Individual Therapy  Maanav Kassabian 01/08/2024, 3:05 PM

## 2024-01-08 NOTE — Progress Notes (Signed)
 PROGRESS NOTE   Subjective/Complaints:  Poor meal intake, doing well with OT, supervision level ADL   ROS: Limited due to cognition, however denies fever, CP, SOB, abd pain, n/v/d.    Objective:   No results found.   Recent Labs    01/07/24 0513  WBC 7.6  HGB 9.9*  HCT 30.4*  PLT 446*    Recent Labs    01/07/24 0513  NA 137  K 3.8  CL 104  CO2 24  GLUCOSE 101*  BUN 21  CREATININE 0.89  CALCIUM  9.0     Intake/Output Summary (Last 24 hours) at 01/08/2024 0841 Last data filed at 01/07/2024 1320 Gross per 24 hour  Intake 476 ml  Output --  Net 476 ml         Physical Exam: Vital Signs Blood pressure 128/63, pulse (!) 103, temperature 97.8 F (36.6 C), resp. rate 18, height 5\' 6"  (1.676 m), weight 57.5 kg, SpO2 98%.   General: No acute distress laying in the bed appears comfortable.  Mood and affect are a little flat but appropriate Heart: Regular rate and rhythm no rubs murmurs or extra sounds appreciated Lungs: Clear to auscultation, breathing unlabored, no rales or wheezes Abdomen: Positive bowel sounds, soft nontender to palpation, nondistended Extremities: No clubbing, cyanosis, or edema  HEENT - mod swelling Right internal jugular area, mild/mod tenderess around 2x2 dressing no blood on dressing  Neuro:  alert , speech with minimal dysarthria  Musculoskeletal: Right thigh with swelling, post op dressing intact , no tenderness to palp  4-/5 BLE 4/5 BUE   Assessment/Plan: 1. Functional deficits which require 3+ hours per day of interdisciplinary therapy in a comprehensive inpatient rehab setting. Physiatrist is providing close team supervision and 24 hour management of active medical problems listed below. Physiatrist and rehab team continue to assess barriers to discharge/monitor patient progress toward functional and medical goals  Care Tool:  Bathing    Body parts bathed by patient:  Right arm, Left arm, Chest, Abdomen, Right upper leg, Left upper leg, Face, Front perineal area, Buttocks   Body parts bathed by helper: Right lower leg, Left lower leg     Bathing assist Assist Level: Minimal Assistance - Patient > 75%     Upper Body Dressing/Undressing Upper body dressing   What is the patient wearing?: Pull over shirt, Bra    Upper body assist Assist Level: Supervision/Verbal cueing    Lower Body Dressing/Undressing Lower body dressing      What is the patient wearing?: Underwear/pull up, Pants     Lower body assist Assist for lower body dressing: Minimal Assistance - Patient > 75%     Toileting Toileting    Toileting assist Assist for toileting: Supervision/Verbal cueing     Transfers Chair/bed transfer  Transfers assist     Chair/bed transfer assist level: Supervision/Verbal cueing     Locomotion Ambulation   Ambulation assist      Assist level: Supervision/Verbal cueing Assistive device: Walker-rolling Max distance: 170   Walk 10 feet activity   Assist     Assist level: Supervision/Verbal cueing Assistive device: Walker-rolling   Walk 50 feet activity   Assist Walk 50  feet with 2 turns activity did not occur: Safety/medical concerns  Assist level: Supervision/Verbal cueing Assistive device: Walker-rolling    Walk 150 feet activity   Assist Walk 150 feet activity did not occur: Safety/medical concerns  Assist level: Supervision/Verbal cueing Assistive device: Walker-rolling    Walk 10 feet on uneven surface  activity   Assist Walk 10 feet on uneven surfaces activity did not occur: Safety/medical concerns   Assist level: Supervision/Verbal cueing Assistive device: Walker-rolling   Wheelchair     Assist Is the patient using a wheelchair?: Yes Type of Wheelchair: Manual    Wheelchair assist level: Dependent - Patient 0% Max wheelchair distance: 15 ft    Wheelchair 50 feet with 2 turns  activity    Assist    Wheelchair 50 feet with 2 turns activity did not occur: Safety/medical concerns       Wheelchair 150 feet activity     Assist  Wheelchair 150 feet activity did not occur: Safety/medical concerns       Blood pressure 128/63, pulse (!) 103, temperature 97.8 F (36.6 C), resp. rate 18, height 5\' 6"  (1.676 m), weight 57.5 kg, SpO2 98%.  Medical Problem List and Plan: 1. 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  Antithrombotics: -DVT/anticoagulation:  Mechanical: Antiembolism stockings, thigh (TED hose) Bilateral lower extremities -12/30/23 DVT U/S yesterday showing age indeterminate DVTs of RLE mid to distal femoral vein, IVC filter placed Dr Marlena Sima 6/2 Will keep HOB elevated 30 deg x 2 hr, may resume therapy this afternoon  -antiplatelet therapy: Aspirin  81 mg daily and Plavix  75 mg daily x 3 weeks then Plavix  alone.started 5/31 3. Pain Management: Oxycodone  as needed 4. Mood/Behavior/Sleep/dementia: Namenda  5 mg nightly, melatonin 5 mg nightly as needed             -antipsychotic agents: N/A             -Continue Telesitter for poor safety awareness Now awake and alert 6/5 5. Neuropsych/cognition: This patient is not capable of making decisions on her own behalf. 6. Skin/Wound Care: Routine skin checks 7. Fluids/Electrolytes/Nutrition: pt has reasonable appetite it appears but cognition might interfere with adequate follow through during meals RD consult  8.  Right displaced femoral neck fracture.  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 Curtiss Dowdy.  WBAT RLE with hip  precautions 9.  Acute blood loss anemia/rectal bleeding.  Transfuse 1 unit packed red blood cells 5/26. 6/2 Hgb stable     Plavix  restarted 5/31  Mild drop in Hgb no overt rectal bleeding reported suspect small post procedure hematoma R internal jugular area 6/9 HGB stable 9.9    Latest Ref Rng & Units 01/07/2024    5:13 AM 01/03/2024    5:55 AM 12/31/2023    5:06 AM  CBC  WBC 4.0 - 10.5 K/uL 7.6  7.8  5.6   Hemoglobin 12.0 - 15.0 g/dL 9.9  9.6  60.4   Hematocrit 36.0 -  46.0 % 30.4  29.2  33.6   Platelets 150 - 400 K/uL 446  434  384     11.  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  Urine sodium 97,serum osmo 366 -Na+ stable  -tighten Fluid restriction to 1200 cc.   - continue P.o. sodium chloride  1g BID -6/9 NA improved to 137, continue  current regimen for now    Latest Ref Rng & Units 01/07/2024    5:13 AM 01/03/2024    5:55 AM 12/31/2023    5:06 AM  BMP  Glucose 70 - 99 mg/dL 161  096  045   BUN 8 - 23 mg/dL 21  21  20    Creatinine 0.44 - 1.00 mg/dL 4.09  8.11  9.14   Sodium 135 - 145 mmol/L 137  134  130   Potassium 3.5 - 5.1 mmol/L 3.8  3.8  4.0   Chloride 98 - 111 mmol/L 104  103  100   CO2 22 - 32 mmol/L 24  23  21    Calcium  8.9 - 10.3 mg/dL 9.0  8.8  8.5     12.  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  Follow-up cardiology services 13.  History of oral cancer with tongue resection.  [facility] 14.  Glaucoma.  Continue eyedrops as indicated 15.  Hypertension.  Toprol -XL 100 mg daily.  Norvasc  5mg  daily. Monitor with increased mobility  -5/31-6/9 BPs fine, monitor Vitals:   01/04/24 0554 01/04/24 1531 01/04/24 1937 01/05/24 0525  BP: (!) 133/53 126/62 (!) 116/55 138/72   01/05/24 1420 01/05/24 1951 01/06/24 0349 01/06/24 1300  BP: 124/63 123/70 138/65 (!) 106/49   01/06/24 1959 01/07/24 0337 01/07/24 1323 01/08/24 0510  BP: 129/65 129/63 (!) 120/55 128/63    16.  Hyperlipidemia.   Crestor  5mg  daily 17.  Hypothyroidism.  Synthroid  75mcg daily 18.  Constipation.  Senokot S1 tablet twice daily as well as Dulcolax suppository   -6/9 LBM today, continue to follow up  LOS: 12 days A FACE TO FACE EVALUATION WAS PERFORMED  Genetta Kenning 01/08/2024, 8:41 AM

## 2024-01-08 NOTE — Plan of Care (Signed)
  Problem: RH Bathing Goal: LTG Patient will bathe all body parts with assist levels (OT) Description: LTG: Patient will bathe all body parts with assist levels (OT) Flowsheets (Taken 01/08/2024 1121) LTG: Pt will perform bathing with assistance level/cueing: (LTG modified due to limitations with bending with R hip) Minimal Assistance - Patient > 75% Note: LTG modified due to limitations with bending with R hip    Problem: RH Dressing Goal: LTG Patient will perform lower body dressing w/assist (OT) Description: LTG: Patient will perform lower body dressing with assist, with/without cues in positioning using equipment (OT) Flowsheets (Taken 01/08/2024 1121) LTG: Pt will perform lower body dressing with assistance level of: (LTG modified due to limitations with bending with R hip) Minimal Assistance - Patient > 75% Note: LTG modified due to limitations with bending with R hip    Problem: RH Toileting Goal: LTG Patient will perform toileting task (3/3 steps) with assistance level (OT) Description: LTG: Patient will perform toileting task (3/3 steps) with assistance level (OT)  Flowsheets (Taken 01/08/2024 1121) LTG: Pt will perform toileting task (3/3 steps) with assistance level: (LTG upgraded due to progress with balance and mobility.) Supervision/Verbal cueing Note: LTG upgraded due to progress with balance and mobility    Problem: RH Toilet Transfers Goal: LTG Patient will perform toilet transfers w/assist (OT) Description: LTG: Patient will perform toilet transfers with assist, with/without cues using equipment (OT) Flowsheets (Taken 01/08/2024 1121) LTG: Pt will perform toilet transfers with assistance level of: (LTG upgraded due to progress with balance and mobility) Supervision/Verbal cueing Note: LTG upgraded due to progress with balance and mobility

## 2024-01-08 NOTE — Progress Notes (Signed)
 Occupational Therapy Session Note  Patient Details  Name: [name] MRN: [id number] Date of Birth: 1940-08-24  Today's Date: 01/08/2024 OT Individual Time: 4098-1191 OT Individual Time Calculation (min): 45 min    Short Term Goals: Week 2:  OT Short Term Goal 1 (Week 2): STG= LTGs  Skilled Therapeutic Interventions/Progress Updates:    Pt received in bed awake and ready to participate.  Donned pt's TED hose for her and then she sat to EOB with supervision. Pt able to recall she was in a hospital, but initially could not recall why. I reminded her she had a stroke and then pt stated "oh yes and I broke my hip" Could not recall month, tried cues of season but pt still unable to recall month or year. Pointed to her reminder sign and pt said "oh I forget about the sign"  Pt stood with S from bed with cue to push up with B hands and ambulated with RW to toilet.  Able to push pants down (therapist helped with brief fasteners, recommended to family on Saturday to bring in pull ups), pt  voided (was dry and documented in flow sheet), cleansed and readjusted pants with S.  Ambulated to wc. Cleansed hands and engaged in breakfast.  Pt showing good initiation with self feeding, but continued to eat very slowly and needing encouragement to eat more.  RN aware pt eating and will supervise.  Call light belt on and all needs met.    Therapy Documentation Precautions:  Precautions Precautions: Fall Precaution/Restrictions Comments: Alz/Dementia Restrictions Weight Bearing Restrictions Per Provider Order: Yes RLE Weight Bearing Per Provider Order: Weight bearing as tolerated    Vital Signs: Therapy Vitals Temp: 97.8 F (36.6 C) Pulse Rate: (!) 103 Resp: 18 BP: 128/63 Patient Position (if appropriate): Lying Oxygen Therapy SpO2: 98 % O2 Device: Room Air Pain: Pain Assessment Pain Scale: 0-10 Pain Score: 0-No pain ADL: ADL Eating: Set up Grooming: Supervision/safety Where  Assessed-Grooming: Standing at sink Upper Body Bathing: Supervision/safety Where Assessed-Upper Body Bathing: Shower Lower Body Bathing: Minimal assistance Where Assessed-Lower Body Bathing: Shower Upper Body Dressing: Supervision/safety Lower Body Dressing: Minimal assistance, Moderate assistance Toileting: Supervision/safety Where Assessed-Toileting: Teacher, adult education: Close supervision Statistician Method: Proofreader: Therapist, sports Transfer: Administrator, arts Method: Designer, industrial/product: Emergency planning/management officer, Grab bars  Therapy/Group: Individual Therapy  Majed Pellegrin 01/08/2024, 9:06 AM

## 2024-01-08 NOTE — Progress Notes (Signed)
 Physical Therapy Session Note  Patient Details  Name: [name] MRN: [id number] Date of Birth: 11-30-1940  Today's Date: 01/08/2024 PT Individual Time: 1034-1130 PT Individual Time Calculation (min): 56 min   Short Term Goals: Week 2:  PT Short Term Goal 1 (Week 2): STG = LTG  Skilled Therapeutic Interventions/Progress Updates: Patient sitting in WC on entrance to room. Patient alert and agreeable to PT session.   Patient with no complaints of pain during session. Pt pleasantly confused throughout session and required recall of VC's, sequence of interventions, and orientation to place (2/2 dementia).   Therapeutic Activity: Transfers: Pt performed sit<>stand transfers throughout session with supervision and VC to pivot steps until back of knees touch sitting surface, and to keep RW close to self when sitting to avoid unnecessary/unsafe anterior weight shift.   - Pt ambulated roughly 100' on non-compliant surfaces outside of [facility] in RW with supervision. Pt required seated rest due to humidity and fatigue.  - Pt navigated orange cone in snake like pattern with RW x  3 rounds with green theraband in middle of RW (perpendicular) with blue tape in middle for pt to have visual cuing to keep close to tape per pt presentation of pushing RW away, especially with turns. Pt cued throughout to decrease WB on B UE's as this increases push. Pt with improved RW proximity on last round. Pt did so with supervision throughout and added cuing to move RW when turning vs sliding.  - Pt ambulated from main gym<room at end of session with supervision and VC to look forward ahead vs down at ground, and to recall to stay close to theraband/tape in middle of RW.   Patient sitting in WC at end of session with brakes locked, belt alarm set, and all needs within reach.      Therapy Documentation Precautions:  Precautions Precautions: Fall Precaution/Restrictions Comments: Alz/Dementia Restrictions Weight  Bearing Restrictions Per Provider Order: Yes RLE Weight Bearing Per Provider Order: Weight bearing as tolerated  Therapy/Group: Individual Therapy  Shiza Thelen PTA 01/08/2024, 12:43 PM

## 2024-01-09 ENCOUNTER — Other Ambulatory Visit (HOSPITAL_COMMUNITY): Payer: Self-pay

## 2024-01-09 DIAGNOSIS — I639 Cerebral infarction, unspecified: Secondary | ICD-10-CM | POA: Diagnosis not present

## 2024-01-09 MED ORDER — MELATONIN 5 MG PO TABS
5.0000 mg | ORAL_TABLET | Freq: Every evening | ORAL | 0 refills | Status: DC | PRN
  Filled 2024-01-09: qty 30, 30d supply, fill #0

## 2024-01-09 MED ORDER — SODIUM CHLORIDE 1 G PO TABS
1.0000 g | ORAL_TABLET | Freq: Two times a day (BID) | ORAL | 0 refills | Status: DC
Start: 2024-01-09 — End: 2024-01-09
  Filled 2024-01-09: qty 60, 30d supply, fill #0

## 2024-01-09 MED ORDER — MEMANTINE HCL 5 MG PO TABS
5.0000 mg | ORAL_TABLET | Freq: Every day | ORAL | 0 refills | Status: DC
  Filled 2024-01-09: qty 30, 30d supply, fill #0

## 2024-01-09 MED ORDER — AMLODIPINE BESYLATE 5 MG PO TABS
5.0000 mg | ORAL_TABLET | Freq: Every day | ORAL | 0 refills | Status: AC
Start: 2024-01-09 — End: ?
  Filled 2024-01-09: qty 30, 30d supply, fill #0

## 2024-01-09 MED ORDER — SODIUM CHLORIDE 1 G PO TABS
1.0000 g | ORAL_TABLET | Freq: Two times a day (BID) | ORAL | 0 refills | Status: DC
  Filled 2024-01-09: qty 28, 14d supply, fill #0

## 2024-01-09 MED ORDER — PANTOPRAZOLE SODIUM 40 MG PO TBEC
40.0000 mg | DELAYED_RELEASE_TABLET | Freq: Two times a day (BID) | ORAL | 0 refills | Status: DC
Start: 2024-01-09 — End: 2024-05-08
  Filled 2024-01-09: qty 60, 30d supply, fill #0

## 2024-01-09 MED ORDER — LEVOTHYROXINE SODIUM 75 MCG PO TABS
75.0000 ug | ORAL_TABLET | Freq: Every day | ORAL | 0 refills | Status: AC
Start: 2024-01-09 — End: ?
  Filled 2024-01-09: qty 30, 30d supply, fill #0

## 2024-01-09 MED ORDER — METOPROLOL SUCCINATE ER 100 MG PO TB24
100.0000 mg | ORAL_TABLET | Freq: Every day | ORAL | 0 refills | Status: AC
Start: 2024-01-09 — End: ?
  Filled 2024-01-09: qty 30, 30d supply, fill #0

## 2024-01-09 MED ORDER — CLOPIDOGREL BISULFATE 75 MG PO TABS
75.0000 mg | ORAL_TABLET | Freq: Every day | ORAL | 0 refills | Status: DC
  Filled 2024-01-09: qty 30, 30d supply, fill #0

## 2024-01-09 MED ORDER — ROSUVASTATIN CALCIUM 5 MG PO TABS
5.0000 mg | ORAL_TABLET | Freq: Every day | ORAL | 0 refills | Status: AC
Start: 2024-01-09 — End: ?
  Filled 2024-01-09: qty 30, 30d supply, fill #0

## 2024-01-09 NOTE — Progress Notes (Addendum)
 PROGRESS NOTE   Subjective/Complaints:  Pt oriented to [facility]op[facility] but not [facility].  Is wondering why WC, Walker , BSC is at bedside   ROS: Limited due to cognition, however denies fever, CP, SOB, abd pain, n/v/d.    Objective:   No results found.   Recent Labs    01/07/24 0513  WBC 7.6  HGB 9.9*  HCT 30.4*  PLT 446*    Recent Labs    01/07/24 0513  NA 137  K 3.8  CL 104  CO2 24  GLUCOSE 101*  BUN 21  CREATININE 0.89  CALCIUM  9.0     Intake/Output Summary (Last 24 hours) at 01/09/2024 0843 Last data filed at 01/09/2024 0834 Gross per 24 hour  Intake 960 ml  Output --  Net 960 ml         Physical Exam: Vital Signs Blood pressure 131/66, pulse 76, temperature 97.9 F (36.6 C), temperature source Oral, resp. rate 17, height 5' 6 (1.676 m), weight 57.5 kg, SpO2 98%.   General: No acute distress laying in the bed appears comfortable.  Mood and affect are a little flat but appropriate Heart: Regular rate and rhythm no rubs murmurs or extra sounds appreciated Lungs: Clear to auscultation, breathing unlabored, no rales or wheezes Abdomen: Positive bowel sounds, soft nontender to palpation, nondistended Extremities: No clubbing, cyanosis, or edema Skin post op dressing removed, incision well healed with sub cut sutures HEENT - mod swelling Right internal jugular area, mild/mod tenderess around 2x2 dressing no blood on dressing  Neuro:  alert , speech with minimal dysarthria  Musculoskeletal: Right thigh with swelling, post op dressing intact , no tenderness to palp  4-/5 BLE 4/5 BUE   Assessment/Plan: 1. Functional deficits which require 3+ hours per day of interdisciplinary therapy in a comprehensive inpatient rehab setting. Physiatrist is providing close team supervision and 24 hour management of active medical problems listed below. Physiatrist and rehab team continue to assess barriers to  discharge/monitor patient progress toward functional and medical goals  Care Tool:  Bathing  Bathing activity did not occur: Refused Body parts bathed by patient: Right arm, Left arm, Chest, Abdomen, Right upper leg, Left upper leg, Face, Front perineal area, Buttocks   Body parts bathed by helper: Right lower leg, Left lower leg     Bathing assist Assist Level: Minimal Assistance - Patient > 75%     Upper Body Dressing/Undressing Upper body dressing   What is the patient wearing?: Pull over shirt, Bra    Upper body assist Assist Level: Supervision/Verbal cueing    Lower Body Dressing/Undressing Lower body dressing      What is the patient wearing?: Underwear/pull up, Pants     Lower body assist Assist for lower body dressing: Minimal Assistance - Patient > 75%     Toileting Toileting    Toileting assist Assist for toileting: Supervision/Verbal cueing     Transfers Chair/bed transfer  Transfers assist     Chair/bed transfer assist level: Supervision/Verbal cueing     Locomotion Ambulation   Ambulation assist      Assist level: Supervision/Verbal cueing Assistive device: Walker-rolling Max distance: 170   Walk 10 feet  activity   Assist     Assist level: Supervision/Verbal cueing Assistive device: Walker-rolling   Walk 50 feet activity   Assist Walk 50 feet with 2 turns activity did not occur: Safety/medical concerns  Assist level: Supervision/Verbal cueing Assistive device: Walker-rolling    Walk 150 feet activity   Assist Walk 150 feet activity did not occur: Safety/medical concerns  Assist level: Supervision/Verbal cueing Assistive device: Walker-rolling    Walk 10 feet on uneven surface  activity   Assist Walk 10 feet on uneven surfaces activity did not occur: Safety/medical concerns   Assist level: Supervision/Verbal cueing Assistive device: Walker-rolling   Wheelchair     Assist Is the patient using a wheelchair?:  Yes Type of Wheelchair: Manual    Wheelchair assist level: Dependent - Patient 0% Max wheelchair distance: 15 ft    Wheelchair 50 feet with 2 turns activity    Assist    Wheelchair 50 feet with 2 turns activity did not occur: Safety/medical concerns       Wheelchair 150 feet activity     Assist  Wheelchair 150 feet activity did not occur: Safety/medical concerns       Blood pressure 131/66, pulse 76, temperature 97.9 F (36.6 C), temperature source Oral, resp. rate 17, height 5' 6 (1.676 m), weight 57.5 kg, SpO2 98%.  Medical Problem List and Plan: 1. 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 Formulized individual treatment plan based on medical history, underlying problem and comorbidities.                 2.  Antithrombotics: -DVT/anticoagulation:  Mechanical: Antiembolism stockings, thigh (TED hose) Bilateral lower extremities -12/30/23 DVT U/S yesterday showing age indeterminate DVTs of RLE mid to distal femoral vein, IVC filter placed Dr Marlena Sima 6/2 Will keep HOB elevated 30 deg x 2 hr, may resume therapy this afternoon  -antiplatelet therapy: Aspirin  81 mg daily and Plavix  75 mg daily x 3 weeks then Plavix  alone.started 5/31 3. Pain Management: Oxycodone  as needed 4. Mood/Behavior/Sleep/dementia: Namenda  5 mg nightly, melatonin 5 mg nightly as needed             -antipsychotic agents: N/A             -Continue Telesitter for poor safety awareness Now awake and alert 6/5 5. Neuropsych/cognition: This patient is not  capable of making decisions on her own behalf. 6. Skin/Wound Care: Routine skin checks 7. Fluids/Electrolytes/Nutrition: pt has reasonable appetite it appears but cognition might interfere with adequate follow through during meals RD consult  8.  Right displaced femoral neck fracture.  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 Curtiss Dowdy.  WBAT RLE with hip precautions F/u with Dr Curtiss Dowdy as OP , no dressing needed  9.  Acute blood loss anemia/rectal bleeding.  Transfuse 1 unit packed red blood cells 5/26. 6/2 Hgb stable     Plavix  restarted 5/31  Mild drop in Hgb no overt rectal bleeding reported suspect small post procedure hematoma  R internal jugular area 6/9 HGB stable 9.9    Latest Ref Rng & Units 01/07/2024    5:13 AM 01/03/2024    5:55 AM 12/31/2023    5:06 AM  CBC  WBC 4.0 - 10.5 K/uL 7.6  7.8  5.6   Hemoglobin 12.0 - 15.0 g/dL 9.9  9.6  16.1   Hematocrit 36.0 - 46.0 % 30.4  29.2  33.6   Platelets 150 - 400 K/uL 446  434  384     11.  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  Urine sodium 97,serum osmo 366 -Na+ stable  -tighten Fluid restriction to 1200 cc.   - continue P.o. sodium chloride  1g BID -6/9 NA improved to 137, continue  current regimen for now    Latest Ref Rng & Units 01/07/2024    5:13 AM 01/03/2024    5:55 AM 12/31/2023    5:06 AM  BMP  Glucose 70 - 99 mg/dL 096  045  409   BUN 8 - 23 mg/dL 21  21  20    Creatinine 0.44 - 1.00 mg/dL 8.11  9.14  7.82   Sodium 135 - 145 mmol/L 137  134  130   Potassium 3.5 - 5.1 mmol/L 3.8  3.8  4.0   Chloride 98 - 111 mmol/L 104  103  100   CO2 22 - 32 mmol/L 24  23  21    Calcium  8.9 - 10.3 mg/dL 9.0  8.8  8.5     12.  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  Follow-up cardiology services 13.  History of oral cancer with tongue resection.  Follow-up outpatient [facility] 14.  Glaucoma.  Continue eyedrops as indicated 15.  Hypertension.  Toprol -XL  100 mg daily.  Norvasc  5mg  daily. Monitor with increased mobility  -5/31-6/9 BPs fine, monitor Vitals:   01/05/24 0525 01/05/24 1420 01/05/24 1951 01/06/24 0349  BP: 138/72 124/63 123/70 138/65   01/06/24 1300 01/06/24 1959 01/07/24 0337 01/07/24 1323  BP: (!) 106/49 129/65 129/63 (!) 120/55   01/08/24 0510 01/08/24 1409 01/08/24 1955 01/09/24 0705  BP: 128/63 129/66 (!) 126/57 131/66    16.  Hyperlipidemia.  Crestor  5mg  daily 17.  Hypothyroidism.  Synthroid  75mcg daily 18.  Constipation.  Senokot S1 tablet twice daily as well as Dulcolax suppository   -6/9 LBM today, continue to follow up  LOS: 13 days A FACE TO FACE EVALUATION WAS PERFORMED  Genetta Kenning 01/09/2024, 8:43 AM

## 2024-01-09 NOTE — Progress Notes (Signed)
 Speech Language Pathology Discharge Summary  Patient Details  Name: [name] MRN: [id number] Date of Birth: 26-Aug-1940  Date of Discharge from SLP service:January 09, 2024  Today's Date: 01/09/2024 SLP Individual Time: 6578-4696 SLP Individual Time Calculation (min): 45 min   Skilled Therapeutic Interventions:   Skilled therapy session focused on cognitive and dysphagia goals. SLP facilitated session by prompting patient to recall orientation information. Patient utilized external aids with min A to recall age, birthday, location, and situation. Patient further recalled biographical  information including address, prior jobs and childrens names independently. SLP targeted simple problem solving skills through review of call bell and its use. Patient recalled this information independently. Patient with significant improvements in cognition this date as she was able to recall several memories from her childhood and her marriage. SLP targeted dysphagia goals through observing patient with D3/thin diet. Patient with timely mastication and complete oral clearance. No s/sx of aspiration throughout. Recommend contination of current diet. Patient left in [facility] with alarm set and call bell in reach. Continue POC.     Patient has met 5 of 5 long term goals.  Patient to discharge at overall Mod;Max level.  Reasons goals not met: n/a   Clinical Impression/Discharge Summary: Pt has made fair gains and has met 5 of 5 LTG's this admission due to improved use of swallowing compensatory strategies and improvements in cognition given external aids. Pt is currently an overall mod-maxA for cognitive tasks and requires min cues for utilization of swallowing compensatory strategies to minimize overt s/sx of aspiration with D3/thin diet. Patient benefits from external aids for orientation and memory. Patient with improved sustained attention to tasks during inpatient stay and is able to recall basic safety knowledge  (problem solving- how to call 911) Pt/family education complete and pt will discharge home with 24 hour supervision from friends/family/etc. Pt would benefit from f/u ST services to maximize cognition in order to maximize functional independence.    Care Partner:  Caregiver Able to Provide Assistance: Yes  Type of Caregiver Assistance: Physical;Cognitive  Recommendation:  24 hour supervision/assistance  Rationale for SLP Follow Up: Maximize cognitive function and independence   Equipment: n/a   Reasons for discharge: Treatment goals met;Discharged from hospital   Patient/Family Agrees with Progress Made and Goals Achieved: Yes    Bravery Ketcham M.A., CCC-SLP 01/09/2024, 12:24 PM

## 2024-01-09 NOTE — Progress Notes (Signed)
 Patient ID: [name], female   DOB: [birth year], 83 y.o.   MRN: [id number]  HHPT/OT referral accepted by [name]. NO SLP but OT can address speech needs. Medical team ok with no SLP. SW asked for OT to address cognition needs.  1308- SW left message for pt son [name] to inform on d/c date tomorrow, and [facility] -[facility]. SW requested return phone call to discuss discharge tomorrow.   [name], MSW, LCSW Office: [phone] Cell: [phone] Fax: [phone]

## 2024-01-09 NOTE — Plan of Care (Signed)
  Problem: RH Swallowing Goal: LTG Patient will consume least restrictive diet using compensatory strategies with assistance (SLP) Description: LTG:  Patient will consume least restrictive diet using compensatory strategies with assistance (SLP) Outcome: Completed/Met   Problem: RH Cognition - SLP Goal: RH LTG Patient will demonstrate orientation with cues Description:  LTG:  Patient will demonstrate orientation to person/place/time/situation with cues (SLP)   Outcome: Completed/Met   Problem: RH Problem Solving Goal: LTG Patient will demonstrate problem solving for (SLP) Description: LTG:  Patient will demonstrate problem solving for basic/complex daily situations with cues  (SLP) Outcome: Completed/Met   Problem: RH Memory Goal: LTG Patient will demonstrate ability for day to day (SLP) Description: LTG:   Patient will demonstrate ability for day to day recall/carryover during cognitive/linguistic activities with assist  (SLP) Outcome: Completed/Met   Problem: RH Attention Goal: LTG Patient will demonstrate this level of attention during functional activites (SLP) Description: LTG:  Patient will will demonstrate this level of attention during functional activites (SLP) Outcome: Completed/Met

## 2024-01-09 NOTE — Progress Notes (Signed)
 Physical Therapy Discharge Summary  Patient Details  Name: [name] MRN: [id number] Date of Birth: 1940/12/24  Date of Discharge from PT service:January 09, 2024  Patient has met 10 of 10 long term goals due to improved activity tolerance, increased strength, and improved coordination.  Patient to discharge at an ambulatory level Supervision.   Patient's care partner is independent to provide the necessary physical and cognitive assistance at discharge.  Recommendation:  Patient will benefit from ongoing skilled PT services in home health setting to continue to advance safe functional mobility, address ongoing impairments in balance and strength, and minimize fall risk.  Equipment: RW and WC  Reasons for discharge: treatment goals met and discharge from hospital  Patient/family agrees with progress made and goals achieved: Yes  PT Discharge Precautions/Restrictions Precautions Precautions: Fall Precaution/Restrictions Comments: Alz/Dementia Restrictions Weight Bearing Restrictions Per Provider Order: No RLE Weight Bearing Per Provider Order: Weight bearing as tolerated (R LE) Pain Pain Assessment Pain Scale: 0-10 Pain Score: 0-No pain Pain Interference Pain Interference Pain Effect on Sleep: 1. Rarely or not at all Pain Interference with Therapy Activities: 1. Rarely or not at all Pain Interference with Day-to-Day Activities: 1. Rarely or not at all Vision/Perception  Vision - History Ability to See in Adequate Light: 1 Impaired Vision - Assessment Additional Comments: L eye functional vision, R eye impairments from glaucoma Perception Perception: Within Functional Limits Praxis Praxis: WFL  Cognition Overall Cognitive Status: Impaired/Different from baseline Arousal/Alertness: Awake/alert Orientation Level:  (Oriented with minA of verbal/visual aid) Year: 2025 Day of Week: Correct Attention: Sustained Sustained Attention: Appears intact Memory: Impaired Memory  Impairment: Decreased recall of new information;Decreased short term memory Decreased Long Term Memory: Verbal basic;Functional basic Decreased Short Term Memory: Functional basic;Verbal basic Awareness: Impaired Awareness Impairment: Intellectual impairment Problem Solving: Impaired Problem Solving Impairment: Verbal basic;Functional basic Safety/Judgment: Impaired Sensation Sensation Light Touch: Appears Intact Hot/Cold: Appears Intact Proprioception: Appears Intact Coordination Gross Motor Movements are Fluid and Coordinated: Yes Fine Motor Movements are Fluid and Coordinated: Yes Motor  Motor Motor - Skilled Clinical Observations: limited by decreased strength s/p R THA Motor - Discharge Observations: limited by decreased strength s/p R THA  Mobility Bed Mobility Bed Mobility: Supine to Sit;Sit to Supine;Rolling Left Rolling Right: Supervision/verbal cueing Rolling Left: Supervision/Verbal cueing Supine to Sit: Supervision/Verbal cueing Sit to Supine: Supervision/Verbal cueing Transfers Transfers: Sit to Stand;Stand to Sit;Stand Pivot Transfers Sit to Stand: Supervision/Verbal cueing Stand to Sit: Supervision/Verbal cueing Stand Pivot Transfers: Supervision/Verbal cueing Stand Pivot Transfer Details: Verbal cues for precautions/safety;Verbal cues for sequencing Transfer (Assistive device): Rolling walker Locomotion  Gait Ambulation: Yes Gait Assistance: Supervision/Verbal cueing Gait Distance (Feet): 150 Feet Assistive device: Rolling walker Gait Gait: Yes Gait Pattern: Impaired Gait Pattern: Antalgic;Step-through pattern Stairs / Additional Locomotion Stairs: Yes Stairs Assistance: Contact Guard/Touching assist Stair Management Technique: Two rails Number of Stairs: 4 Height of Stairs: 6 Wheelchair Mobility Wheelchair Mobility: Yes Wheelchair Assistance: Doctor, general practice: Both upper extremities Wheelchair Parts Management:  Needs assistance Distance: 50  Trunk/Postural Assessment  Cervical Assessment Cervical Assessment: Exceptions to [facility] (forward head) Thoracic Assessment Thoracic Assessment: Exceptions to [facility] (rounded shoulders) Lumbar Assessment Lumbar Assessment: Exceptions to [facility] (posterior pelvic tilt) Postural Control Postural Control: Within Functional Limits Trunk Control: Fucntional during dynamic sitting/standing tasks  Balance Balance Balance Assessed: Yes Static Sitting Balance Static Sitting - Balance Support: Bilateral upper extremity supported;Feet supported Static Sitting - Level of Assistance: 7: Independent Dynamic Sitting Balance Dynamic Sitting - Balance Support: Feet supported;No  upper extremity supported Dynamic Sitting - Level of Assistance: 7: Independent Static Standing Balance Static Standing - Balance Support: Bilateral upper extremity supported;During functional activity Static Standing - Level of Assistance: 5: Stand by assistance Dynamic Standing Balance Dynamic Standing - Balance Support: Bilateral upper extremity supported;Left upper extremity supported Dynamic Standing - Level of Assistance: 5: Stand by assistance Extremity Assessment  RUE Assessment RUE Assessment: Within Functional Limits LUE Assessment LUE Assessment: Within Functional Limits RLE Assessment General Strength Comments: 3+/5 functionally (not formally assessed) LLE Assessment General Strength Comments: 4-/5 functionally (not formally assessed)  Rex Oesterle P. Janicia Monterrosa, PT   Dominic Sandoval PTA  01/09/2024, 1:05 PM

## 2024-01-09 NOTE — Progress Notes (Signed)
 Inpatient Rehabilitation Discharge Medication Review by a Pharmacist  A complete drug regimen review was completed for this patient to identify any potential clinically significant medication issues.  High Risk Drug Classes Is patient taking? Indication by Medication  Antipsychotic No   Anticoagulant No   Antibiotic No   Opioid No   Antiplatelet Yes Clopidogrel  - CVA  Hypoglycemics/insulin No   Vasoactive Medication Yes Metoprolol , amlodipine - HTN  Chemotherapy No   Other Yes Memantine  - dementia  Pantoprazole  - Reflux Rosuvastatin  - HLD Sodium chloride  - salt replacement  Vitamin D  - bones Levothyroxine  - low thyroid  Cosopt , Lumigan, Prednisolone - glaucoma     Type of Medication Issue Identified Description of Issue Recommendation(s)  Drug Interaction(s) (clinically significant)     Duplicate Therapy     Allergy     No Medication Administration End Date     Incorrect Dose     Additional Drug Therapy Needed     Significant med changes from prior encounter (inform family/care partners about these prior to discharge).    Other       Clinically significant medication issues were identified that warrant physician communication and completion of prescribed/recommended actions by midnight of the next day:  No  Name of provider notified for urgent issues identified:   Provider Method of Notification:     Pharmacist comments: None  Time spent performing this drug regimen review (minutes):  20 minutes  Thank you. Lennice Quivers, PharmD

## 2024-01-09 NOTE — Patient Care Conference (Signed)
 Inpatient RehabilitationTeam Conference and Plan of Care Update Date: 01/11/2024   Time: 10:15 AM    Patient Name: [name]      Medical Record Number: [id number]  Date of Birth: 03-19-1941 Sex: Female         Room/Bed: 4W14C/4W14C-01 Payor Info: Payor: [employer] MEDICARE / Plan: Raina Bunting MEDICARE HMO/PPO / Product Type: *No Product type* /    Admit Date/Time:  12/27/2023  3:44 PM  Primary Diagnosis:  Ischemic cerebrovascular accident (CVA) [facility])  Hospital Problems: Principal Problem:   Ischemic cerebrovascular accident (CVA) (HCC) Active Problems:   Hyponatremia   Slow transit constipation   Essential hypertension    Expected Discharge Date: Expected Discharge Date: 01/11/24  Team Members Present: Physician leading conference: Dr. Janeece Mechanic Social Worker Present: [name], LCSW Nurse Present: Forrestine Ike, RN PT Present: Seferino Dade, Baxter Bott, PT OT Present: Kenda Paula, OT SLP Present: Reggie Caper, SLP PPS Coordinator present : Jestine Moron, SLP     Current Status/Progress Goal Weekly Team Focus  Bowel/Bladder   pt continent of b/b   Remain continent   Assist with bathroom qshift and prn    Swallow/Nutrition/ Hydration               ADL's   close to goal level, improved initiation and processing,  mod A LB dressing   supervision toilet transfers, min A to shower, min A LB dressing/ bathing, supervision toileting   ADL training, functional mobility, pt/fam ed    Mobility   overall supervision with transfers, ambulation (RW), stairs, bed mobility   suprevision/CGA  Dynamic standing balance without AD, functional mobility, pt/fam ed    Communication                Safety/Cognition/ Behavioral Observations               Pain   No c/o pain   Remain pain free   Assess qshift and prn    Skin   Incision to Right thigh, sutures   Maintain skin integrity  Assess qshift and prn      Discharge Planning:  D/c to  home with children and hired caregivers. SW will confirm there are no barriers to discharge.   Team Discussion: Patient post CVA with fall; hip fracture.  Limited by dementia.  Patient on target to meet rehab goals: yes, on target to meet supervision goals.  *See Care Plan and progress notes for long and short-term goals.   Revisions to Treatment Plan:  N/a   Teaching Needs: Safety, medications, transfers, toileting, etc.   Current Barriers to Discharge: Decreased caregiver support and Home enviroment access/layout  Possible Resolutions to Barriers: Family education completed HH follow up services DME: BSC, TTB, RW, W/C     Medical Summary Current Status: Wound healing right THA, no dressing needed, asymptomatic from right thigh DVT, right IJ access site no longer swollen  Barriers to Discharge: Other (comments)  Barriers to Discharge Comments: Poor endurance, moderate dementia Possible Resolutions to [employer] Focus: Family training prior to discharge, medical stability improved   Continued Need for Acute Rehabilitation Level of Care: The patient requires daily medical management by a physician with specialized training in physical medicine and rehabilitation for the following reasons: Direction of a multidisciplinary physical rehabilitation program to maximize functional independence : Yes Medical management of patient stability for increased activity during participation in an intensive rehabilitation regime.: Yes Analysis of laboratory values and/or radiology reports with any subsequent need for medication  adjustment and/or medical intervention. : Yes   I attest that I was present, lead the team conference, and concur with the assessment and plan of the team.   Forrestine Ike B 01/11/2024, 10:41 AM

## 2024-01-09 NOTE — Progress Notes (Signed)
 Occupational Therapy Session Note  Patient Details  Name: [name] MRN: [id number] Date of Birth: 04-16-1941  Today's Date: 01/09/2024 OT Individual Time: 1120-1205 OT Individual Time Calculation (min): 45 min    Short Term Goals: Week 1:  OT Short Term Goal 1 (Week 1): Pt will be able to don pants with min A using AE as needed OT Short Term Goal 1 - Progress (Week 1): Met OT Short Term Goal 2 (Week 1): Pt will be able to self cleanse post toileting with min A. OT Short Term Goal 2 - Progress (Week 1): Met OT Short Term Goal 3 (Week 1): Pt will sit to stand with CGA at [facility]. OT Short Term Goal 3 - Progress (Week 1): Met Week 2:  OT Short Term Goal 1 (Week 2): STG= LTGs      Skilled Therapeutic Interventions/Progress Updates:    Pt received in wc and agreeable to a shower but wanted to toilet first. She completed sit to stands and ambulation into bathroom with RW with supervision and NO cues needed as pt instinctively used good technique.   See ADL documentation below.  Pt completed toileting, shower and dressing and sat back in wc to rest .  Call belt on and all needs met.   Therapy Documentation Precautions:  Precautions Precautions: Fall Precaution/Restrictions Comments: Alz/Dementia Restrictions Weight Bearing Restrictions Per Provider Order: Yes RLE Weight Bearing Per Provider Order: Weight bearing as tolerated   Pain: Pain Assessment Pain Scale: 0-10 Pain Score: 0-No pain ADL: ADL Eating: Set up Grooming: Supervision/safety Where Assessed-Grooming: Standing at sink Upper Body Bathing: Supervision/safety Where Assessed-Upper Body Bathing: Shower Lower Body Bathing: Supervision/safety (using long handled sponge) Where Assessed-Lower Body Bathing: Shower Upper Body Dressing: Supervision/safety Lower Body Dressing: Minimal assistance (min A only to start pants over feet) Toileting: Supervision/safety Where Assessed-Toileting: Teacher, adult education: Close  supervision Statistician Method: Proofreader: Raised toilet seat Tub/Shower Transfer: Scientific laboratory technician Method: Ship broker: Insurance underwriter: Administrator, arts Method: Designer, industrial/product: Emergency planning/management officer, Grab bars        Therapy/Group: Individual Therapy  Shantice Menger 01/09/2024, 12:27 PM

## 2024-01-09 NOTE — Progress Notes (Signed)
 Physical Therapy Session Note  Patient Details  Name: [name] MRN: [id number] Date of Birth: 06/14/1941  Today's Date: 01/09/2024 PT Individual Time: 7425-9563 PT Individual Time Calculation (min): 41 min   Short Term Goals: Week 2:  PT Short Term Goal 1 (Week 2): STG = LTG  Skilled Therapeutic Interventions/Progress Updates: Patient supine in bed on entrance to room. Patient alert and agreeable to PT session.   Patient reported no pain. Pt requested use of toilet to void bladder. Pt performed supine<sit EOB with supervision, and ambulated from EOB<toilet in RW with supervision. PTA assisted with donning/doffing personal lower body clothing/brief for time management (pt performed anterior pericare without assistance). Pt dynamic standing balance with supervision as pt washed hands following at sink. PTA adjusted pt's new WC and RW (added theraband and tape in center of RW as visual cue for pt to maintain safe proximity to RW at home).   Patient sitting in WC at end of session with brakes locked, belt alarm set, and all needs within reach.      Therapy Documentation Precautions:  Precautions Precautions: Fall Precaution/Restrictions Comments: Alz/Dementia Restrictions Weight Bearing Restrictions Per Provider Order: Yes RLE Weight Bearing Per Provider Order: Weight bearing as tolerated   Therapy/Group: Individual Therapy  Damarys Speir PTA 01/09/2024, 12:19 PM

## 2024-01-09 NOTE — Plan of Care (Signed)

## 2024-01-10 DIAGNOSIS — I10 Essential (primary) hypertension: Secondary | ICD-10-CM | POA: Diagnosis not present

## 2024-01-10 DIAGNOSIS — I639 Cerebral infarction, unspecified: Secondary | ICD-10-CM | POA: Diagnosis not present

## 2024-01-10 LAB — BASIC METABOLIC PANEL WITH GFR
Anion gap: 8 (ref 5–15)
BUN: 17 mg/dL (ref 8–23)
CO2: 25 mmol/L (ref 22–32)
Calcium: 9.2 mg/dL (ref 8.9–10.3)
Chloride: 107 mmol/L (ref 98–111)
Creatinine, Ser: 0.9 mg/dL (ref 0.44–1.00)
GFR, Estimated: >60 mL/min (ref >60)
Glucose, Bld: 99 mg/dL (ref 70–99)
Potassium: 4 mmol/L (ref 3.5–5.1)
Sodium: 140 mmol/L (ref 135–145)

## 2024-01-10 LAB — CBC
HCT: 33.3 % — ABNORMAL LOW (ref 36.0–46.0)
Hemoglobin: 10.6 g/dL — ABNORMAL LOW (ref 12.0–15.0)
MCH: 29.7 pg (ref 26.0–34.0)
MCHC: 31.8 g/dL (ref 30.0–36.0)
MCV: 93.3 fL (ref 80.0–100.0)
Platelets: 422 10*3/uL — ABNORMAL HIGH (ref 150–400)
RBC: 3.57 MIL/uL — ABNORMAL LOW (ref 3.87–5.11)
RDW: 14.5 % (ref 11.5–15.5)
WBC: 8.7 10*3/uL (ref 4.0–10.5)
nRBC: 0 % (ref 0.0–0.2)

## 2024-01-10 NOTE — Progress Notes (Signed)
 PROGRESS NOTE   Subjective/Complaints:  Pt eating a Slow breakfast. No new issues today. Doesn't realize she's leaving today  ROS: Patient denies fever, rash, sore throat, blurred vision, dizziness, nausea, vomiting, diarrhea, cough, shortness of breath or chest pain, joint or back/neck pain, headache, or mood change.    Objective:   No results found.   Recent Labs    01/10/24 0616  WBC 8.7  HGB 10.6*  HCT 33.3*  PLT 422*    Recent Labs    01/10/24 0616  NA 140  K 4.0  CL 107  CO2 25  GLUCOSE 99  BUN 17  CREATININE 0.90  CALCIUM  9.2     Intake/Output Summary (Last 24 hours) at 01/10/2024 0924 Last data filed at 01/10/2024 0845 Gross per 24 hour  Intake 240 ml  Output --  Net 240 ml         Physical Exam: Vital Signs Blood pressure 136/68, pulse 73, temperature 98 F (36.7 C), resp. rate 16, height 5' 6 (1.676 m), weight 57.5 kg, SpO2 95%.   Constitutional: No distress . Vital signs reviewed. HEENT: NCAT, EOMI, oral membranes moist Neck: supple Cardiovascular: RRR without murmur. No JVD    Respiratory/Chest: CTA Bilaterally without wheezes or rales. Normal effort    GI/Abdomen: BS +, non-tender, non-distended Ext: no clubbing, cyanosis, or edema Psych: pleasant and cooperative  Skin post op dressing removed, incision well healed with sub cut sutures   Neuro:  alert , speech with minimal dysarthria. Oriented to self, hospital Musculoskeletal: Right thigh with swelling, post op dressing intact , no tenderness to palp. Moves all 4's at least 4/5.    Assessment/Plan: 1. Functional deficits which require 3+ hours per day of interdisciplinary therapy in a comprehensive inpatient rehab setting. Physiatrist is providing close team supervision and 24 hour management of active medical problems listed below. Physiatrist and rehab team continue to assess barriers to discharge/monitor patient  progress toward functional and medical goals  Care Tool:  Bathing  Bathing activity did not occur: Refused Body parts bathed by patient: Right arm, Left arm, Chest, Abdomen, Right upper leg, Left upper leg, Face, Front perineal area, Buttocks, Left lower leg, Right lower leg   Body parts bathed by helper: Right lower leg, Left lower leg     Bathing assist Assist Level: Supervision/Verbal cueing     Upper Body Dressing/Undressing Upper body dressing   What is the patient wearing?: Pull over shirt    Upper body assist Assist Level: Supervision/Verbal cueing    Lower Body Dressing/Undressing Lower body dressing      What is the patient wearing?: Underwear/pull up, Pants     Lower body assist Assist for lower body dressing: Minimal Assistance - Patient > 75%     Toileting Toileting    Toileting assist Assist for toileting: Supervision/Verbal cueing     Transfers Chair/bed transfer  Transfers assist     Chair/bed transfer assist level: Supervision/Verbal cueing     Locomotion Ambulation   Ambulation assist      Assist level: Supervision/Verbal cueing Assistive device: Walker-rolling Max distance: 200   Walk 10 feet activity   Assist     Assist  level: Supervision/Verbal cueing Assistive device: Walker-rolling   Walk 50 feet activity   Assist Walk 50 feet with 2 turns activity did not occur: Safety/medical concerns  Assist level: Supervision/Verbal cueing Assistive device: Walker-rolling    Walk 150 feet activity   Assist Walk 150 feet activity did not occur: Safety/medical concerns  Assist level: Supervision/Verbal cueing Assistive device: Walker-rolling    Walk 10 feet on uneven surface  activity   Assist Walk 10 feet on uneven surfaces activity did not occur: Safety/medical concerns   Assist level: Supervision/Verbal cueing Assistive device: Walker-rolling   Wheelchair     Assist Is the patient using a wheelchair?:  Yes Type of Wheelchair: Manual    Wheelchair assist level: Supervision/Verbal cueing Max wheelchair distance: 50    Wheelchair 50 feet with 2 turns activity    Assist    Wheelchair 50 feet with 2 turns activity did not occur: Safety/medical concerns   Assist Level: Supervision/Verbal cueing   Wheelchair 150 feet activity     Assist  Wheelchair 150 feet activity did not occur: Safety/medical concerns   Assist Level: Supervision/Verbal cueing   Blood pressure 136/68, pulse 73, temperature 98 F (36.7 C), resp. rate 16, height 5' 6 (1.676 m), weight 57.5 kg, SpO2 95%.  Medical Problem List and Plan: 1. Functional deficits secondary to ischemic nonhemorrhagic infarct involving the high left frontal lobe 12/18/2023 complicated by right displaced femoral neck fracture R THA 5/23 after a fall in the hospital             -patient may shower             -dc home today. F/u with HH, neurology, [facility], PCP                 2.  Antithrombotics: -DVT/anticoagulation:  Mechanical: Antiembolism stockings, thigh (TED hose) Bilateral lower extremities -12/30/23 DVT U/S yesterday showing age indeterminate DVTs of RLE mid to distal femoral vein, IVC filter placed Dr Marlena Sima 6/2 Will keep HOB elevated 30 deg x 2 hr, may resume therapy this afternoon  -antiplatelet therapy: Aspirin  81 mg daily and Plavix  75 mg daily x 3 weeks then Plavix  alone.started 5/31 3. Pain Management: Oxycodone  as needed 4. Mood/Behavior/Sleep/dementia: Namenda  5 mg nightly, melatonin 5 mg nightly as needed             -antipsychotic agents: N/A             -Continue Telesitter for poor safety awareness Now awake and alert 6/5 5. Neuropsych/cognition: This patient is not capable of making decisions on her own behalf. 6. Skin/Wound Care: Routine skin checks 7. Fluids/Electrolytes/Nutrition: pt has reasonable appetite it appears but cognition might interfere with adequate follow through during meals RD consult  8.   Right displaced femoral neck fracture.  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 Curtiss Dowdy.  WBAT RLE with hip precautions F/u with Dr Curtiss Dowdy as OP , no dressing needed  9.  Acute blood loss anemia/rectal bleeding.  Transfuse 1 unit packed red blood cells 5/26. 6/2 Hgb stable     Plavix  restarted 5/31  Mild drop in Hgb no overt rectal bleeding reported suspect small post procedure hematoma R internal jugular area 6/12 hgb up to 10.6    Latest Ref Rng & Units 01/10/2024    6:16 AM 01/07/2024    5:13 AM 01/03/2024    5:55 AM  CBC  WBC 4.0 - 10.5 K/uL 8.7  7.6  7.8   Hemoglobin 12.0 -  15.0 g/dL 16.1  9.9  9.6   Hematocrit 36.0 - 46.0 % 33.3  30.4  29.2   Platelets 150 - 400 K/uL 422  446  434     11.  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  Urine sodium 97,serum osmo 366 -Na+ stable  -tighten Fluid restriction to 1200 cc.   - continue P.o. sodium chloride  1g BID -6/12 na+ 140    Latest Ref Rng & Units 01/10/2024    6:16 AM 01/07/2024    5:13 AM 01/03/2024    5:55 AM  BMP  Glucose 70 - 99 mg/dL 99  096  045   BUN 8 - 23 mg/dL 17  21  21    Creatinine 0.44 - 1.00 mg/dL 4.09  8.11  9.14   Sodium 135 - 145 mmol/L 140  137  134   Potassium 3.5 - 5.1 mmol/L 4.0  3.8  3.8   Chloride 98 - 111 mmol/L 107  104  103   CO2 22 - 32 mmol/L 25  24  23    Calcium  8.9 - 10.3 mg/dL 9.2  9.0  8.8     12.  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  Follow-up cardiology services 13.  History of oral cancer with tongue resection.  Follow-up outpatient [facility] 14.  Glaucoma.  Continue eyedrops as indicated 15.  Hypertension.  Toprol -XL 100 mg daily.  Norvasc  5mg  daily. Monitor with increased mobility  -6/12 bp's controlled Vitals:   01/06/24 0349 01/06/24 1300 01/06/24 1959 01/07/24 0337  BP: 138/65 (!) 106/49 129/65 129/63   01/07/24 1323 01/08/24 0510 01/08/24 1409 01/08/24 1955  BP: (!) 120/55 128/63 129/66 (!) 126/57    01/09/24 0705 01/09/24 1359 01/09/24 1953 01/10/24 0551  BP: 131/66 135/64 125/64 136/68    16.  Hyperlipidemia.  Crestor  5mg  daily 17.  Hypothyroidism.  Synthroid  75mcg daily 18.  Constipation.  Senokot S1 tablet twice daily as well as Dulcolax suppository   -6/11 LBM LOS: 14 days A FACE TO FACE EVALUATION WAS PERFORMED  Rawland Caddy 01/10/2024, 9:24 AM

## 2024-01-10 NOTE — Progress Notes (Addendum)
 Patient ID: [name], female   DOB: [birth year], 83 y.o.   MRN: [id number]   [id number] left message for pt son [name] to follow-up about pt discharge today , pt medically ready for discharge, and [facility] -[facility]. SW requested return phone call to discuss discharge.   [id number]- SW left message for pt dtr [name] to inform on above, and requesting return phone call   [phone]- SW spoke with pt son [name] to inform on above. SW asked for assistance with confirming someone will pick up pt today,  *SW received returned phone call from pt son [name] reporting it sounds like things are together at home. He was confirming pt does not require physical assistance at home. Reports will ask his brother to call.   [phone]- SW returned phone call to pt son [name] to discuss discharge. He reports he has concerns about her discharge as things are not together at home as they do not have bed rails in, and nurse that is coming to the home cannot start until tomorrow. SW informed will relay to attending, and ask them to follow-up.   *Per attending, pt will now discharge to home tomorrow. SW updated [name]/[facility] on changes.   [name], MSW, LCSW Office: [phone] Cell: [phone] Fax: [phone]

## 2024-01-11 ENCOUNTER — Other Ambulatory Visit (HOSPITAL_COMMUNITY): Payer: Self-pay

## 2024-01-11 DIAGNOSIS — K5901 Slow transit constipation: Secondary | ICD-10-CM | POA: Diagnosis not present

## 2024-01-11 DIAGNOSIS — I10 Essential (primary) hypertension: Secondary | ICD-10-CM

## 2024-01-11 DIAGNOSIS — E871 Hypo-osmolality and hyponatremia: Secondary | ICD-10-CM

## 2024-01-11 DIAGNOSIS — I639 Cerebral infarction, unspecified: Secondary | ICD-10-CM | POA: Diagnosis not present

## 2024-01-11 NOTE — Progress Notes (Signed)
 Inpatient Rehabilitation Care Coordinator Discharge Note   Patient Details  Name: [name] MRN: [id number] Date of Birth: 05/12/1941   Discharge location: D/c to home with family and hired caregivers  Length of Stay: 14 days  Discharge activity level: Supervision  Home/community participation: limited  Patient response HY:QMVHQI Literacy - How often do you need to have someone help you when you read instructions, pamphlets, or other written material from your doctor or pharmacy?: Patient unable to respond  Patient response ON:GEXBMW Isolation - How often do you feel lonely or isolated from those around you?: Patient unable to respond  Services provided included: RD, MD, PT, OT, SLP, RN, CM, TR, Pharmacy, Neuropsych, SW  Financial Services:  Field seismologist Utilized: Private Insurance [employer]  Choices offered to/list presented to: patient family  Follow-up services arranged:  Home Health, Patient/Family request agency HH/DME, DME Home Health Agency: [facility] for HHPT/OT; no SLP offered    DME : [facility] for RW, w/c. TTB, [facility] HH/DME Requested Agency: [facility]  Patient response to transportation need: Is the patient able to respond to transportation needs?: Yes In the past 12 months, has lack of transportation kept you from medical appointments or from getting medications?: No In the past 12 months, has lack of transportation kept you from meetings, work, or from getting things needed for daily living?: No   Patient/Family verbalized understanding of follow-up arrangements:  Yes  Individual responsible for coordination of the follow-up plan: contact pt son Lavonia Powers (arrives from [place] on Monday 6/16)  Confirmed correct DME delivered: Rennis Case 01/11/2024    Comments (or additional information):fam edu completed  Summary of Stay    Date/Time Discharge Planning CSW  01/02/24 1004 D/c location in pending based on care needs at dischrge. SW  will confirm there are no barriers to discharge. AAC       Zulay Corrie A Brendolyn Callas

## 2024-01-11 NOTE — Progress Notes (Signed)
 PROGRESS NOTE   Subjective/Complaints:  No new complaints. In good spirits. Appears to have slept well  ROS: limited due to cognition   Objective:   No results found.   Recent Labs    01/10/24 0616  WBC 8.7  HGB 10.6*  HCT 33.3*  PLT 422*    Recent Labs    01/10/24 0616  NA 140  K 4.0  CL 107  CO2 25  GLUCOSE 99  BUN 17  CREATININE 0.90  CALCIUM  9.2     Intake/Output Summary (Last 24 hours) at 01/11/2024 0853 Last data filed at 01/10/2024 1319 Gross per 24 hour  Intake 236 ml  Output --  Net 236 ml         Physical Exam: Vital Signs Blood pressure 137/65, pulse 71, temperature 98 F (36.7 C), resp. rate 16, height 5' 6 (1.676 m), weight 57.5 kg, SpO2 98%.   Constitutional: No distress . Vital signs reviewed. HEENT: NCAT, EOMI, oral membranes moist Neck: supple Cardiovascular: RRR without murmur. No JVD    Respiratory/Chest: CTA Bilaterally without wheezes or rales. Normal effort    GI/Abdomen: BS +, non-tender, non-distended Ext: no clubbing, cyanosis, or edema Psych: pleasant and cooperative  Skin post op dressing removed, incision well healed with sub cut sutures   Neuro:  alert , speech with minimal dysarthria. Oriented to self and being in hospital. Follows directions. Normal language.  Musculoskeletal: Right thigh with swelling, post op dressing intact , no tenderness to palp. Moves all 4's at least 4/5.    Assessment/Plan: 1. Functional deficits which require 3+ hours per day of interdisciplinary therapy in a comprehensive inpatient rehab setting. Physiatrist is providing close team supervision and 24 hour management of active medical problems listed below. Physiatrist and rehab team continue to assess barriers to discharge/monitor patient progress toward functional and medical goals  Care Tool:  Bathing  Bathing activity did not occur: Refused Body parts bathed by patient:  Right arm, Left arm, Chest, Abdomen, Right upper leg, Left upper leg, Face, Front perineal area, Buttocks, Left lower leg, Right lower leg   Body parts bathed by helper: Right lower leg, Left lower leg     Bathing assist Assist Level: Supervision/Verbal cueing     Upper Body Dressing/Undressing Upper body dressing   What is the patient wearing?: Pull over shirt    Upper body assist Assist Level: Supervision/Verbal cueing    Lower Body Dressing/Undressing Lower body dressing      What is the patient wearing?: Underwear/pull up, Pants     Lower body assist Assist for lower body dressing: Minimal Assistance - Patient > 75%     Toileting Toileting    Toileting assist Assist for toileting: Supervision/Verbal cueing     Transfers Chair/bed transfer  Transfers assist     Chair/bed transfer assist level: Supervision/Verbal cueing     Locomotion Ambulation   Ambulation assist      Assist level: Supervision/Verbal cueing Assistive device: Walker-rolling Max distance: 200   Walk 10 feet activity   Assist     Assist level: Supervision/Verbal cueing Assistive device: Walker-rolling   Walk 50 feet activity   Assist Walk 50 feet with  2 turns activity did not occur: Safety/medical concerns  Assist level: Supervision/Verbal cueing Assistive device: Walker-rolling    Walk 150 feet activity   Assist Walk 150 feet activity did not occur: Safety/medical concerns  Assist level: Supervision/Verbal cueing Assistive device: Walker-rolling    Walk 10 feet on uneven surface  activity   Assist Walk 10 feet on uneven surfaces activity did not occur: Safety/medical concerns   Assist level: Supervision/Verbal cueing Assistive device: Walker-rolling   Wheelchair     Assist Is the patient using a wheelchair?: Yes Type of Wheelchair: Manual    Wheelchair assist level: Supervision/Verbal cueing Max wheelchair distance: 50    Wheelchair 50 feet with 2  turns activity    Assist    Wheelchair 50 feet with 2 turns activity did not occur: Safety/medical concerns   Assist Level: Supervision/Verbal cueing   Wheelchair 150 feet activity     Assist  Wheelchair 150 feet activity did not occur: Safety/medical concerns   Assist Level: Supervision/Verbal cueing   Blood pressure 137/65, pulse 71, temperature 98 F (36.7 C), resp. rate 16, height 5' 6 (1.676 m), weight 57.5 kg, SpO2 98%.  Medical Problem List and Plan: 1. 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  F/u with HH, neurology, [facility], PCP  -spoke to son Sylvester Evert yesterday about plan. Nurse to house today.                 2.  Antithrombotics: -DVT/anticoagulation:  Mechanical: Antiembolism stockings, thigh (TED hose) Bilateral lower extremities -12/30/23 DVT U/S yesterday showing age indeterminate DVTs of RLE mid to distal femoral vein, IVC filter placed Dr Marlena Sima 6/2 Will keep HOB elevated 30 deg x 2 hr, may resume therapy this afternoon  -antiplatelet therapy: Aspirin  81 mg daily and Plavix  75 mg daily x 3 weeks then Plavix  alone.started 5/31 3. Pain Management: Oxycodone  as needed 4. Mood/Behavior/Sleep/dementia: Namenda  5 mg nightly, melatonin 5 mg nightly as needed             -antipsychotic agents: N/A             -Continue Telesitter for poor safety awareness Now awake and alert 6/13 5. Neuropsych/cognition: This patient is not capable of making decisions on her own behalf. 6. Skin/Wound Care: Routine skin checks 7. Fluids/Electrolytes/Nutrition: pt has reasonable appetite it appears but cognition might interfere with adequate follow through during meals RD consult  6/13-pt will need cueing to ensure adequate intake at home 8.  Right displaced  femoral neck fracture.  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 Curtiss Dowdy.  WBAT RLE with hip precautions F/u with Dr Curtiss Dowdy as OP , no dressing needed  9.  Acute blood loss anemia/rectal bleeding.  Transfuse 1 unit packed red blood cells 5/26. 6/2 Hgb stable     Plavix  restarted 5/31  Mild drop in Hgb no overt rectal bleeding reported suspect small post procedure hematoma R internal jugular area 6/12 hgb up to 10.6    Latest Ref Rng & Units 01/10/2024    6:16 AM 01/07/2024    5:13 AM 01/03/2024    5:55 AM  CBC  WBC 4.0 - 10.5 K/uL 8.7  7.6  7.8  Hemoglobin 12.0 - 15.0 g/dL 21.3  9.9  9.6   Hematocrit 36.0 - 46.0 % 33.3  30.4  29.2   Platelets 150 - 400 K/uL 422  446  434     11.  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  Urine sodium 97,serum osmo 366 -Na+ stable  -tighten Fluid restriction to 1200 cc.   - continue P.o. sodium chloride  1g BID -6/12 na+ 140    Latest Ref Rng & Units 01/10/2024    6:16 AM 01/07/2024    5:13 AM 01/03/2024    5:55 AM  BMP  Glucose 70 - 99 mg/dL 99  086  578   BUN 8 - 23 mg/dL 17  21  21    Creatinine 0.44 - 1.00 mg/dL 4.69  6.29  5.28   Sodium 135 - 145 mmol/L 140  137  134   Potassium 3.5 - 5.1 mmol/L 4.0  3.8  3.8   Chloride 98 - 111 mmol/L 107  104  103   CO2 22 - 32 mmol/L 25  24  23    Calcium  8.9 - 10.3 mg/dL 9.2  9.0  8.8     12.  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  Follow-up cardiology services 13.  History of oral cancer with tongue resection.  Follow-up outpatient [facility] 14.  Glaucoma.  Continue eyedrops as indicated 15.  Hypertension.  Toprol -XL 100 mg daily.  Norvasc  5mg  daily. Monitor with increased mobility  -6/13 bp's controlled Vitals:   01/07/24 0337 01/07/24 1323 01/08/24 0510 01/08/24 1409  BP: 129/63 (!) 120/55 128/63 129/66   01/08/24 1955 01/09/24 0705 01/09/24 1359 01/09/24 1953  BP: (!) 126/57 131/66 135/64 125/64   01/10/24 0551  01/10/24 1317 01/10/24 1923 01/11/24 0555  BP: 136/68 131/63 (!) 117/59 137/65    16.  Hyperlipidemia.  Crestor  5mg  daily 17.  Hypothyroidism.  Synthroid  75mcg daily 18.  Constipation.  Senokot S1 tablet twice daily as well as Dulcolax suppository   -6/11 LBM  -family education re: maintaining regularity at home LOS: 15 days A FACE TO FACE EVALUATION WAS PERFORMED  Rawland Caddy 01/11/2024, 8:53 AM

## 2024-01-16 ENCOUNTER — Encounter: Payer: Self-pay | Admitting: *Deleted

## 2024-01-21 ENCOUNTER — Ambulatory Visit: Payer: Medicare HMO | Admitting: Adult Health

## 2024-01-25 ENCOUNTER — Encounter: Admitting: Registered Nurse

## 2024-01-29 ENCOUNTER — Encounter (HOSPITAL_COMMUNITY)

## 2024-01-30 ENCOUNTER — Telehealth: Payer: Self-pay | Admitting: Cardiovascular Disease

## 2024-01-30 NOTE — Telephone Encounter (Signed)
 Pts daughter calling requesting cb to discuss heart monitor that they just rec'vd and her concerns

## 2024-01-31 NOTE — Telephone Encounter (Signed)
 Returned call to patients daughter and answered all her questions. Patient has mild dementia and she is worried about her being able to keep the monitor on. I explained why the monitor was ordered and what they were looking for. Patient is going to talk to family and decide if they want to pursue the monitor.

## 2024-02-05 ENCOUNTER — Ambulatory Visit (INDEPENDENT_AMBULATORY_CARE_PROVIDER_SITE_OTHER): Admitting: Acute Care

## 2024-02-05 ENCOUNTER — Encounter: Admitting: Registered Nurse

## 2024-02-05 ENCOUNTER — Encounter: Payer: Self-pay | Admitting: Acute Care

## 2024-02-05 VITALS — BP 115/67 | HR 69 | Ht 66.0 in | Wt 125.6 lb

## 2024-02-05 DIAGNOSIS — Z85819 Personal history of malignant neoplasm of unspecified site of lip, oral cavity, and pharynx: Secondary | ICD-10-CM | POA: Diagnosis not present

## 2024-02-05 DIAGNOSIS — R911 Solitary pulmonary nodule: Secondary | ICD-10-CM

## 2024-02-05 DIAGNOSIS — Z87891 Personal history of nicotine dependence: Secondary | ICD-10-CM | POA: Diagnosis not present

## 2024-02-05 NOTE — Patient Instructions (Addendum)
 It is good to see you today. We have reviewed your scan results. There is a 6 mm lung nodule we need to keep a close watch on. We will do a follow up CT Chest in the next week or so. You will get a call to get this scheduled.  You will follow up with Lauraine NP 1-2 weeks after the scan has been done to review the results. Call for any weight loss or blood in sputum.  Call if you need us  sooner.  Please contact office for sooner follow up if symptoms do not improve or worsen or seek emergency care

## 2024-02-05 NOTE — Progress Notes (Signed)
 History of Present Illness [name] is a 83 y.o. female former smoker ( Quit 1996) referred for consult after an  incidental finding of a pulmonary nodule on Coronary Scoring scan. She will be followed by Dr. 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 Verlin & Dr. Maryjane   Scoliosis    Severe aortic stenosis    Wears glasses       02/05/2024 Pt. Presents for consult for incidentally found pulmonary nodule 09/2023 on a cardiac calcium  scoring scan. 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 She had an ischemic CVA and right femoral DVT presenting to the ED on 12/18/2023.She did not receive TNK, but is currently on Plavix .. She fell while in the hospital and broke her hip.  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 She appears to be doing remarkably well today considering her recent medical issues. She states she is tired.She does have a chronic non productive cough. She denies any weight loss or hemoptysis.  We have reviewed her CT Chest done 09/2023. There is notation of a 6 mm pulmonary nodule. She has a very remote history of smoking from 1994-1996, about 4 cigarettes a day. 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 Recommendation was for a 3-6 month follow up scan. We will do a scan now at 4 months to reassess the nodule.   She is retired. She lives with a care giver.She uses a walker.  She has a non productive cough, and she has some issues  with depression. She had TAVR for severe aortic stenosis 10/2023.  She has a history of oral cancer, with tongue resection that is managed at [facility]. .   She is here with her daughter. She has some memory issues, but no other obvious impairments or residual from her stroke.   Both patient and daughter are in agreement with follow up CT Chest, and review to assess lung nodule for stability.   Test Results: 10/05/2023 CT Coronary Morph w/ CTA Cor W/ Score with and without contrast Mild Tree-in-bud appearing infiltrates within the left lung base and superior segment of the right upper lobe, likely infectious or inflammatory in etiology. 2. Mild lingular atelectasis with an adjacent 6 mm pulmonary nodule. Non-contrast chest CT at 3-6 months is recommended.     Latest Ref Rng & Units 01/10/2024    6:16 AM 01/07/2024    5:13 AM 01/03/2024    5:55 AM  CBC  WBC 4.0 - 10.5 K/uL 8.7  7.6  7.8   Hemoglobin 12.0 - 15.0 g/dL 89.3  9.9  9.6   Hematocrit 36.0 - 46.0 % 33.3  30.4  29.2   Platelets 150 - 400 K/uL 422  446  434        Latest Ref Rng & Units 01/10/2024    6:16 AM 01/07/2024  5:13 AM 01/03/2024    5:55 AM  BMP  Glucose 70 - 99 mg/dL 99  898  889   BUN 8 - 23 mg/dL 17  21  21    Creatinine 0.44 - 1.00 mg/dL 9.09  9.10  8.95   Sodium 135 - 145 mmol/L 140  137  134   Potassium 3.5 - 5.1 mmol/L 4.0  3.8  3.8   Chloride 98 - 111 mmol/L 107  104  103   CO2 22 - 32 mmol/L 25  24  23    Calcium  8.9 - 10.3 mg/dL 9.2  9.0  8.8     BNP No results found for: BNP  ProBNP No results found for: PROBNP  PFT No results found for: FEV1PRE, FEV1POST, FVCPRE, FVCPOST, TLC, DLCOUNC, PREFEV1FVCRT, PSTFEV1FVCRT  No results found.   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 Verlin & Dr. Maryjane   Scoliosis    Severe aortic stenosis    Wears glasses      Social History   Tobacco Use   Smoking status: Former    Current packs/day: 0.00    Average packs/day: 0.2 packs/day for 2.0 years (0.4 ttl pk-yrs)    Types: Cigarettes    Start date: 07/31/1992    Quit date: 07/31/1994    Years since quitting: 29.5   Smokeless tobacco: Never   Tobacco comments:    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  Vaping Use   Vaping status: Never Used  Substance Use Topics   Alcohol  use: No   Drug use: No    Ms.Ralston reports that she quit smoking about 29 years ago. Her smoking use included cigarettes. She started smoking about 31 years ago. She has a 0.4 pack-year smoking history. She has never used smokeless tobacco. She reports that she does not drink alcohol  and does not use drugs.  Tobacco Cessation: Counseling given: Not Answered Tobacco comments: 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 Former very remote smoker  Past surgical hx, Family hx, Social hx all reviewed.  Current Outpatient Medications on File Prior to Visit  Medication Sig   amLODipine  (NORVASC ) 5 MG tablet Take 1 tablet (5 mg total) by mouth daily.   aspirin  EC 81 MG tablet Take 81 mg by mouth daily. Swallow whole.   Calcium  Carbonate (CALTRATE 600 PO) Take 1 tablet by mouth daily after lunch.   Cholecalciferol (VITAMIN D3) 10 MCG (400 UNIT) tablet Take 400 Units by mouth daily after lunch.   clopidogrel  (PLAVIX ) 75 MG tablet Take 1 tablet (75 mg total) by mouth daily.   dorzolamide -timolol  (COSOPT ) 22.3-6.8 MG/ML ophthalmic solution Place 1 drop into the left eye every 12 (twelve) hours.   levothyroxine  (SYNTHROID ) 75 MCG tablet Take 1 tablet (75 mcg total) by mouth daily after lunch.   LUMIGAN 0.01 % SOLN Place 1 drop into the left eye at bedtime.   memantine   (NAMENDA ) 5 MG tablet Take 1 tablet (5 mg total) by mouth at bedtime.   metoprolol  succinate (TOPROL -XL) 100 MG 24 hr  tablet Take 1 tablet (100 mg total) by mouth daily after lunch. Take with or immediately following a meal.   Multiple Vitamin (MULTIVITAMIN WITH MINERALS) TABS tablet Take 1 tablet by mouth daily after lunch. Centrum Silver   pantoprazole  (PROTONIX ) 40 MG tablet Take 1 tablet (40 mg total) by mouth 2 (two) times daily.   prednisoLONE  acetate (PREDNISOLONE  ACETATE P-F) 1 % ophthalmic suspension Place 1 drop into the right eye in the morning.   rosuvastatin  (CRESTOR ) 5 MG tablet Take 1 tablet (5 mg total) by mouth daily after lunch.   melatonin 5 MG TABS Take 1 tablet (5 mg total) by mouth at bedtime as needed. (Patient not taking: Reported on 02/05/2024)   polyvinyl alcohol  (ARTIFICIAL TEARS) 1.4 % ophthalmic solution Place 1 drop into both eyes as needed for dry eyes. (Patient not taking: Reported on 02/05/2024)   senna-docusate (SENOKOT-S) 8.6-50 MG tablet Take 1 tablet by mouth 2 (two) times daily between meals as needed for mild constipation. (Patient not taking: Reported on 02/05/2024)   No current facility-administered medications on file prior to visit.     Allergies  Allergen Reactions   Brimonidine Itching    Red eyes   Fluocinolone Acetonide Other (See Comments)    Unable to recall   Naphazoline-Polyethyl Glycol     THAT YOU USE TO DILATE YOUR EYES.SABRAMAKES BP GO UP, CAN ONLY USE HALF STRENGTH   Triamcinolone Acetonide Other (See Comments)    Unable to recall   Tropicamide Other (See Comments)    Elevates IOP OK to use 0.5%    Review Of Systems:  Constitutional:   No  weight loss, night sweats,  Fevers, chills, + fatigue, or  lassitude.  HEENT:   No headaches,  Difficulty swallowing,  Tooth/dental problems, or  Sore throat,                No sneezing, itching, ear ache, nasal congestion, post nasal drip,   CV:  No chest pain,  Orthopnea, PND, swelling in lower  extremities, anasarca, dizziness, palpitations, syncope.   GI  No heartburn, indigestion, abdominal pain, nausea, vomiting, diarrhea, change in bowel habits, loss of appetite, bloody stools.   Resp: No shortness of breath with exertion or at rest.  No excess mucus, no productive cough,  + non-productive cough,  No coughing up of blood.  No change in color of mucus.  No wheezing.  No chest wall deformity  Skin: no rash or lesions.  GU: no dysuria, change in color of urine, no urgency or frequency.  No flank pain, no hematuria   MS:  No joint pain or swelling.  No decreased range of motion.  No back pain.  Psych:  No change in mood or affect. No depression or anxiety.  No memory loss.   Vital Signs BP 115/67 (BP Location: Right Arm, Patient Position: Sitting, Cuff Size: Normal)   Pulse 69   Ht 5' 6 (1.676 m)   Wt 125 lb 9.6 oz (57 kg)   SpO2 95%   BMI 20.27 kg/m    Physical Exam:  General- No distress,  A&Ox3, pleasant, frail elderly female ENT: No sinus tenderness, TM clear, pale nasal mucosa, no oral exudate,no post nasal drip, no LAN Cardiac: S1, S2, regular rate and rhythm, no murmur Chest: No wheeze/ rales/ dullness; no accessory muscle use, no nasal flaring, no sternal retractions, diminished per bases Abd.: Soft Non-tender, ND, BS +, Body mass index is 20.27 kg/m.  Ext: No clubbing cyanosis, edema,  no obvious deformities Neuro: Physical deconditioning, moving all extremities x 4, alert and oriented x 2, left-sided facial droop most likely secondary to previous oral cancer surgery Skin: No rashes, warm and dry, no obvious skin lesions Psych: normal mood and behavior, some memory issues   Assessment/Plan Incidental pulmonary nodule noted on cardiac calcium  scoring scan April 2025 Very remote smoking history, with 0.2-pack-year smoking history Plan It is good to see you today. We have reviewed your scan results. There is a 6 mm lung nodule we need to keep a close watch  on. We will do a follow up CT Chest in the next week or so. You will get a call to get this scheduled.  You will follow up with Lauraine NP 1-2 weeks after the scan has been done to review the results. Call for any weight loss or blood in sputum.  Call if you need us  sooner.  Please contact office for sooner follow up if symptoms do not improve or worsen or seek emergency care    I spent 25 minutes dedicated to the care of this patient on the date of this encounter to include pre-visit review of records, face-to-face time with the patient discussing conditions above, post visit ordering of testing, clinical documentation with the electronic health record, making appropriate referrals as documented, and communicating necessary information to the patient's healthcare team.       Lauraine JULIANNA Lites, NP 02/05/2024  5:03 PM

## 2024-02-08 ENCOUNTER — Encounter: Attending: Registered Nurse | Admitting: Registered Nurse

## 2024-02-08 ENCOUNTER — Telehealth: Payer: Self-pay | Admitting: Cardiovascular Disease

## 2024-02-08 ENCOUNTER — Encounter: Payer: Self-pay | Admitting: Registered Nurse

## 2024-02-08 VITALS — BP 131/79 | HR 62 | Ht 66.0 in | Wt 125.0 lb

## 2024-02-08 DIAGNOSIS — I10 Essential (primary) hypertension: Secondary | ICD-10-CM | POA: Insufficient documentation

## 2024-02-08 DIAGNOSIS — I639 Cerebral infarction, unspecified: Secondary | ICD-10-CM | POA: Insufficient documentation

## 2024-02-08 NOTE — Telephone Encounter (Signed)
 Pt c/o medication issue:  1. Name of Medication: aspirin  EC 81 MG tablet   2. How are you currently taking this medication (dosage and times per day)?    3. Are you having a reaction (difficulty breathing--STAT)? no  4. What is your medication issue? Calling to make sure the patient is suppose to be taking. Stated on the discharger paper it wasn't on there. Please advise

## 2024-02-08 NOTE — Telephone Encounter (Signed)
 Spoke with the patient's daughter who states that the patient has continued to take ASA 81 mg daily. They just realized today that Aspirin  was not on her medication list when she was discharged from the hospital. Advised daughter that her ASA was discontinued. She confirms that the patient is still taking Plavix .

## 2024-02-08 NOTE — Progress Notes (Unsigned)
 Subjective:    Patient ID: [name], female    DOB: 1940-10-31, 83 y.o.   MRN: [id number]  HPI: [name] is a 83 y.o. female who is here or HFU appointment for follow up of her Ischemic Cerebrovascular accident and Essensial Hypertension. She was brought to [facility] on 12/18/2023, due to increased confusion for the past 5 days.   Dr. Shona H&P: HPI: [name] is a 83 y.o. 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  Associated with unsteady gait, which is not her baseline.  Family thought that she was having a urinary tract infection or dehydration and finally decided to bring her to the ER for further evaluation.  EMS was activated.   In the ER, hypertensive, UA negative for pyuria and euvolemic on exam.  Noncontrast head CT did not show any acute intracranial abnormality however MRI brain showed punctate acute ischemic nonhemorrhagic infarct involving the hide left frontal lobe.  Underlying age-related cerebral atrophy with moderately advanced chronic microvascular ischemic disease.   Neurology/stroke team was consulted and saw the patient.  Stroke workup was initiated.  Admitted by [facility], hospitalist service.  CT Head WO Contrast:  IMPRESSION: *No acute intracranial abnormality.  MR Brain: WO Contrast:  IMPRESSION: 1. 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 2. Underlying age-related cerebral atrophy with moderately advanced chronic microvascular ischemic disease.   CTA:  MPRESSION: Suboptimal CTA due to bolus timing.   No emergent finding.   Atherosclerosis without flow reducing stenosis or irregularity involving major arteries in the head and neck.   Neurology was consulted.    Ms. [name] was admitted to inpatient Rehabilitation on 12/27/2023 and discharged home on 01/11/2024. She is receiving Home Health Therapy with [facility]. She denies any pain. Also reports she has a good appetite.   Daughter in room.    Pain Inventory Average Pain no pain Pain Right Now no pain My pain is No pain  LOCATION OF PAIN  No pain  BOWEL Number of stools per week: 6  BLADDER Normal    Mobility walk without assistance use a walker ability to climb steps?  yes do you drive?  no use a wheelchair Do you have any goals in this area?  yes  Function retired I need assistance with the following:  meal prep, household duties, and shopping Do you have any goals in this area?  yes  Neuro/Psych trouble walking depression anxiety  Prior Studies Any changes since last visit?  no  Physicians involved in your care    Family History  Problem Relation Age of Onset   Thyroid  disease Mother    Dementia Mother    Thyroid  disease Sister    Social History   Socioeconomic History   Marital status: Widowed    Spouse name: Not on file   Number of children: 3   Years of education: Not on file   Highest education level: Not on file  Occupational History   Not on file  Tobacco Use   Smoking status: Former    Current packs/day: 0.00    Average packs/day: 0.2 packs/day for 2.0 years (0.4 ttl pk-yrs)    Types: Cigarettes    Start date: 07/31/1992    Quit date: 07/31/1994  Years since quitting: 29.5   Smokeless tobacco: Never   Tobacco comments:    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  Vaping Use   Vaping status: Never Used  Substance and Sexual Activity   Alcohol  use: No   Drug use: No   Sexual activity: Not on file  Other Topics Concern   Not on file  Social History Narrative   12/28/21 lives alone   Social Drivers of Health   Financial Resource Strain: Not on file  Food Insecurity: No Food Insecurity (12/27/2023)   Hunger Vital Sign    Worried About  Running Out of Food in the Last Year: Never true    Ran Out of Food in the Last Year: Never true  Transportation Needs: Patient Unable To Answer (12/27/2023)   PRAPARE - Transportation    Lack of Transportation (Medical): Patient unable to answer    Lack of Transportation (Non-Medical): Patient unable to answer  Physical Activity: Not on file  Stress: Not on file  Social Connections: Socially Isolated (12/27/2023)   Social Connection and Isolation Panel    Frequency of Communication with Friends and Family: Twice a week    Frequency of Social Gatherings with Friends and Family: More than three times a week    Attends Religious Services: Never    Database administrator or Organizations: No    Attends Banker Meetings: Never    Marital Status: Widowed   Past Surgical History:  Procedure Laterality Date   CESAREAN SECTION  07/31/1976   TWINS, ONE WAS STILLBORN   INTRAOPERATIVE TRANSTHORACIC ECHOCARDIOGRAM N/A 11/20/2023   Procedure: ECHOCARDIOGRAM, TRANSTHORACIC;  Surgeon: Verlin Lonni BIRCH, MD;  Location: [facility] INVASIVE CV LAB;  Service: Cardiovascular;  Laterality: N/A;   IR IVC FILTER PLMT / S&I /IMG GUID/MOD SED  12/31/2023   MOUTH SURGERY  07/31/1994   tongue resection   NODULE REMOVED     BENIGN, LOWER NECK   RIGHT HEART CATH AND CORONARY ANGIOGRAPHY N/A 09/21/2023   Procedure: RIGHT HEART CATH AND CORONARY ANGIOGRAPHY;  Surgeon: Verlin Lonni BIRCH, MD;  Location: [facility] INVASIVE CV LAB;  Service: Cardiovascular;  Laterality: N/A;   TOTAL HIP ARTHROPLASTY Right 12/21/2023   Procedure: ARTHROPLASTY, HIP, TOTAL, ANTERIOR APPROACH;  Surgeon: Kendal Franky SQUIBB, MD;  Location: [facility] OR;  Service: Orthopedics;  Laterality: Right;  HEMI HIP   Past Medical History:  Diagnosis Date   Cataract    COPD (chronic obstructive pulmonary disease) (HCC)    Glaucoma    Goiter    Hypertension 02/21/2018   Hypothyroid    Memory loss    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 Verlin & Dr. Maryjane   Scoliosis    Severe aortic stenosis    Wears glasses    Ht 5' 6 (1.676 m)   Wt 125 lb (56.7 kg)   BMI 20.18 kg/m   Opioid Risk Score:   Fall Risk Score:  `1  Depression screen [facility] 2/9     02/08/2024    2:38 PM  Depression screen PHQ 2/9  Decreased Interest 1  Down, Depressed, Hopeless 2  PHQ - 2 Score 3  Altered sleeping 0  Tired, decreased energy 1  Change in appetite 1  Feeling bad or failure about yourself  0  Trouble concentrating 0  Moving slowly or fidgety/restless 0  Suicidal thoughts 0  PHQ-9 Score 5    Review of Systems  Gastrointestinal:  Positive for vomiting.  Musculoskeletal:  Positive for gait problem.  Psychiatric/Behavioral:         Depression, Anxiety  All other systems reviewed and are negative.      Objective:   Physical Exam Vitals and nursing note reviewed.  Constitutional:      Appearance: Normal appearance.  Cardiovascular:     Rate and Rhythm: Normal rate and regular rhythm.     Pulses: Normal pulses.     Heart sounds: Normal heart sounds.  Pulmonary:     Effort: Pulmonary effort is normal.     Breath sounds: Normal breath sounds.  Musculoskeletal:     Comments: Normal Muscle Bulk and Muscle Testing Reveals:  Upper Extremities: Full ROM and Muscle Strength 5/5  Lower Extremities: Full ROM and Muscle Strength 5/5 Arises from Table slowly using walker for support Narrow Based  Gait     Skin:    General: Skin is warm and dry.  Neurological:     Mental Status: She is alert and oriented to person, place, and time.  Psychiatric:        Mood and Affect: Mood normal.        Behavior: Behavior normal.           Assessment & Plan:  Ischemic Cerebrovascular accident: Continue Home Health Therapy with [facility] and Continue current medication  regimen.  Essential Hypertension: Continue current medication regimen. PCP following. Continue to Monitor.   F/U with Dr Carilyn in 4- 6 weeks

## 2024-02-16 ENCOUNTER — Emergency Department (HOSPITAL_BASED_OUTPATIENT_CLINIC_OR_DEPARTMENT_OTHER)

## 2024-02-16 ENCOUNTER — Encounter (HOSPITAL_BASED_OUTPATIENT_CLINIC_OR_DEPARTMENT_OTHER): Payer: Self-pay

## 2024-02-16 ENCOUNTER — Emergency Department (HOSPITAL_BASED_OUTPATIENT_CLINIC_OR_DEPARTMENT_OTHER)
Admission: EM | Admit: 2024-02-16 | Discharge: 2024-02-16 | Disposition: A | Discharge status: Home / Self Care | Attending: Emergency Medicine | Admitting: Emergency Medicine

## 2024-02-16 ENCOUNTER — Other Ambulatory Visit: Payer: Self-pay

## 2024-02-16 ENCOUNTER — Emergency Department (HOSPITAL_BASED_OUTPATIENT_CLINIC_OR_DEPARTMENT_OTHER): Admitting: Radiology

## 2024-02-16 DIAGNOSIS — S40021A Contusion of right upper arm, initial encounter: Secondary | ICD-10-CM | POA: Insufficient documentation

## 2024-02-16 DIAGNOSIS — X58XXXA Exposure to other specified factors, initial encounter: Secondary | ICD-10-CM | POA: Diagnosis not present

## 2024-02-16 DIAGNOSIS — Z7982 Long term (current) use of aspirin: Secondary | ICD-10-CM | POA: Diagnosis not present

## 2024-02-16 DIAGNOSIS — F0390 Unspecified dementia without behavioral disturbance: Secondary | ICD-10-CM | POA: Insufficient documentation

## 2024-02-16 DIAGNOSIS — Z7901 Long term (current) use of anticoagulants: Secondary | ICD-10-CM | POA: Diagnosis not present

## 2024-02-16 DIAGNOSIS — S4991XA Unspecified injury of right shoulder and upper arm, initial encounter: Secondary | ICD-10-CM | POA: Diagnosis present

## 2024-02-16 LAB — CBC
HCT: 37.7 % (ref 36.0–46.0)
Hemoglobin: 12.1 g/dL (ref 12.0–15.0)
MCH: 30 pg (ref 26.0–34.0)
MCHC: 32.1 g/dL (ref 30.0–36.0)
MCV: 93.3 fL (ref 80.0–100.0)
Platelets: 270 K/uL (ref 150–400)
RBC: 4.04 MIL/uL (ref 3.87–5.11)
RDW: 14.1 % (ref 11.5–15.5)
WBC: 6.6 K/uL (ref 4.0–10.5)
nRBC: 0 % (ref 0.0–0.2)

## 2024-02-16 MED ORDER — ACETAMINOPHEN 325 MG PO TABS: 650.0000 mg | ORAL_TABLET | Freq: Once | ORAL | Status: DC

## 2024-02-16 NOTE — ED Triage Notes (Signed)
 She is here for non-traumatic hematoma of upper, outer right arm. She tells me she is on blood thinner. She is alert and in no distress. She tells me that she had a hospitalization this year at [facility] for CVA, and while in hospital, she fell and sustained a hip fracture, which was surgically repaired.

## 2024-02-16 NOTE — Discharge Instructions (Addendum)
 It was our pleasure to provide your ER care today - we hope that you feel better.  Your lab tests and imaging  results look good/normal.  Return to ER if worse, new symptoms, increased swelling, spreading redness, severe pain, or other concern.

## 2024-02-16 NOTE — ED Notes (Signed)
 Reviewed discharge instructions and home care with pt and daughter. Both verbalized understanding and had no further questions. Pt exited ED without complications.

## 2024-02-16 NOTE — ED Provider Notes (Signed)
  EMERGENCY DEPARTMENT AT [facility] Provider Note   CSN: [id number] Arrival date & time: 02/16/24  1255     Patient presents with: right arm hematoma   [name] is a 83 y.o. female.   Pt with hematoma ?contusion to right upper arm. No known trauma, but pt with hx dementia and is a limited historian - level 5 caveat. No other abnormal bruising or bleeding. Is on aspirin  and plavix . Mild soreness to area. No fevers.  No limited use or function of arm.   The history is provided by the patient, medical records and a relative. The history is limited by the condition of the patient.       Prior to Admission medications   Medication Sig Start Date End Date Taking? Authorizing Provider  amLODipine  (NORVASC ) 5 MG tablet Take 1 tablet (5 mg total) by mouth daily. 01/09/24   Angiulli, Toribio PARAS, PA-C  aspirin  EC 81 MG tablet Take 81 mg by mouth daily. Swallow whole.    [provider]  Calcium  Carbonate (CALTRATE 600 PO) Take 1 tablet by mouth daily after lunch.    [provider]  Cholecalciferol (VITAMIN D3) 10 MCG (400 UNIT) tablet Take 400 Units by mouth daily after lunch.    [provider]  clopidogrel  (PLAVIX ) 75 MG tablet Take 1 tablet (75 mg total) by mouth daily. 01/10/24   Angiulli, Toribio PARAS, PA-C  dorzolamide -timolol  (COSOPT ) 22.3-6.8 MG/ML ophthalmic solution Place 1 drop into the left eye every 12 (twelve) hours.    [provider]  levothyroxine  (SYNTHROID ) 75 MCG tablet Take 1 tablet (75 mcg total) by mouth daily after lunch. 01/09/24   Angiulli, Toribio PARAS, PA-C  LUMIGAN 0.01 % SOLN Place 1 drop into the left eye at bedtime. 04/13/20   [provider]  melatonin 5 MG TABS Take 1 tablet (5 mg total) by mouth at bedtime as needed. 01/09/24   Angiulli, Toribio PARAS, PA-C  memantine  (NAMENDA ) 5 MG tablet Take 1 tablet (5 mg total) by mouth at bedtime. 01/09/24   Angiulli, Toribio PARAS, PA-C  metoprolol  succinate (TOPROL -XL) 100  MG 24 hr tablet Take 1 tablet (100 mg total) by mouth daily after lunch. Take with or immediately following a meal. 01/09/24   Angiulli, Toribio PARAS, PA-C  Multiple Vitamin (MULTIVITAMIN WITH MINERALS) TABS tablet Take 1 tablet by mouth daily after lunch. Centrum Silver    [provider]  pantoprazole  (PROTONIX ) 40 MG tablet Take 1 tablet (40 mg total) by mouth 2 (two) times daily. Patient not taking: Reported on 02/08/2024 01/09/24   Angiulli, Toribio PARAS, PA-C  polyvinyl alcohol  (ARTIFICIAL TEARS) 1.4 % ophthalmic solution Place 1 drop into both eyes as needed for dry eyes.    [provider]  prednisoLONE  acetate (PREDNISOLONE  ACETATE P-F) 1 % ophthalmic suspension Place 1 drop into the right eye in the morning.    [provider]  rosuvastatin  (CRESTOR ) 5 MG tablet Take 1 tablet (5 mg total) by mouth daily after lunch. 01/09/24   Angiulli, Toribio PARAS, PA-C  senna-docusate (SENOKOT-S) 8.6-50 MG tablet Take 1 tablet by mouth 2 (two) times daily between meals as needed for mild constipation. Patient not taking: Reported on 02/08/2024 12/27/23   Gonfa, Taye T, MD    Allergies: Brimonidine, Fluocinolone acetonide, Naphazoline-polyethyl glycol, Triamcinolone acetonide, and Tropicamide    Review of Systems  Constitutional:  Negative for fever.  Skin:        Bruising right upper arm  Neurological:  Negative for weakness and numbness.    Updated Vital Signs BP 134/69   Pulse 64   Temp 98 F (36.7 C) (Oral)   SpO2 97%   Physical Exam Vitals and nursing note reviewed.  Constitutional:      Appearance: Normal appearance. She is well-developed.  HENT:     Head: Atraumatic.     Nose: Nose normal.  Eyes:     General: No scleral icterus.    Conjunctiva/sclera: Conjunctivae normal.  Neck:     Trachea: No tracheal deviation.  Cardiovascular:     Rate and Rhythm: Normal rate.     Pulses: Normal pulses.     Heart sounds:     No gallop.  Pulmonary:     Effort: Pulmonary  effort is normal. No respiratory distress.  Musculoskeletal:     Cervical back: Neck supple. No muscular tenderness.     Comments: Area of bruising to right upper arm, mid aspect of upper arm slightly medially, ~ 8 cm diameter, in different stages of bruising, some purple, some yellow. Mild induration ?contusion to central aspect. No pulsatile mass or lesion noted. No mass felt. No significant sts noted. Good rom RUE without pain. Radial pulse 2+.  Skin:    General: Skin is warm and dry.     Findings: No rash.  Neurological:     Mental Status: She is alert.     Comments: Alert, speech normal. RUE nvi.   Psychiatric:        Mood and Affect: Mood normal.     (all labs ordered are listed, but only abnormal results are displayed) Results for orders placed or performed during the hospital encounter of 02/16/24  CBC   Collection Time: 02/16/24  5:00 PM  Result Value Ref Range   WBC 6.6 4.0 - 10.5 K/uL   RBC 4.04 3.87 - 5.11 MIL/uL   Hemoglobin 12.1 12.0 - 15.0 g/dL   HCT 62.2 63.9 - 53.9 %   MCV 93.3 80.0 - 100.0 fL   MCH 30.0 26.0 - 34.0 pg   MCHC 32.1 30.0 - 36.0 g/dL   RDW 85.8 88.4 - 84.4 %   Platelets 270 150 - 400 K/uL   nRBC 0.0 0.0 - 0.2 %      EKG: None  Radiology: DG Humerus Right Result Date: 02/16/2024 CLINICAL DATA:  Bruising, question injury. EXAM: RIGHT HUMERUS - 2+ VIEW COMPARISON:  None Available. FINDINGS: There is no evidence of fracture or other focal bone lesions. Cortical margins of the humerus are intact. Shoulder and elbow alignment are maintained. No erosions or periostitis. Site of bruising is not well delineated by radiograph. IMPRESSION: Negative radiographs of the right humerus. Electronically Signed   By: Andrea Gasman M.D.   On: 02/16/2024 16:22     Procedures   Medications Ordered in the ED - No data to display                                  Medical Decision Making Problems Addressed: Arm bruise, right, initial encounter: acute  illness or injury  Amount and/or Complexity of Data Reviewed Independent Historian:     Details: Family, hx External Data Reviewed: notes. Labs: ordered. Decision-making details documented in ED Course. Radiology: ordered and independent interpretation performed. Decision-making details documented in ED Course.  Risk OTC drugs.   ILabs ordered/sent. Imaging ordered.  Reviewed nursing notes and prior charts for additional history.  External reports reviewed. Additional history from: daughter.  Labs reviewed/interpreted by me - hgb and plt normal.   Xrays reviewed/interpreted by me - no fx or radioopaque mass.  Acetaminophen  po.    Rec close pcp f/u.  Return precautions provided.       Final diagnoses:  Arm bruise, right, initial encounter    ED Discharge Orders     None          Bernard Drivers, MD 02/16/24 1737

## 2024-02-20 ENCOUNTER — Other Ambulatory Visit (HOSPITAL_COMMUNITY): Payer: Self-pay

## 2024-02-20 ENCOUNTER — Ambulatory Visit (HOSPITAL_BASED_OUTPATIENT_CLINIC_OR_DEPARTMENT_OTHER)
Admission: RE | Admit: 2024-02-20 | Discharge: 2024-02-20 | Disposition: A | Discharge status: Home / Self Care | Source: Ambulatory Visit | Attending: Acute Care | Admitting: Acute Care

## 2024-02-20 DIAGNOSIS — R911 Solitary pulmonary nodule: Secondary | ICD-10-CM | POA: Diagnosis present

## 2024-02-27 ENCOUNTER — Encounter: Payer: Self-pay | Admitting: Cardiovascular Disease

## 2024-02-27 ENCOUNTER — Ambulatory Visit: Attending: Cardiovascular Disease | Admitting: Cardiovascular Disease

## 2024-02-27 VITALS — BP 100/52 | HR 64 | Ht 66.0 in | Wt 124.8 lb

## 2024-02-27 DIAGNOSIS — I2510 Atherosclerotic heart disease of native coronary artery without angina pectoris: Secondary | ICD-10-CM | POA: Diagnosis not present

## 2024-02-27 DIAGNOSIS — Z952 Presence of prosthetic heart valve: Secondary | ICD-10-CM | POA: Diagnosis not present

## 2024-02-27 NOTE — Progress Notes (Signed)
 [facility] Note  Chief Complaint  Patient presents with   Aortic Stenosis   History of Present Illness: 83 yo female with history of oral cancer, COPD, HTN, HLD, hypothyroidism, memory loss, psoriasis and severe aortic stenosis s/p TAVR who is here today for follow up. I met her as a new consult in January 2025 for the  evaluation of aortic stenosis. Murmur was found in primary care. Echo was done in primary care and read by outside cardiology group. This was read as severe AS with normal LV function. Echo in our office 08/29/23 with LVEF=70-75%. Severe aortic stenosis with mean gradient of 46 mmHg, AVA 0.64 cm2, DI 0.25. Cardiac cath February 2025 with mild CAD. She underwent TAVR on 11/20/23 with placement of a 26 mm Evolut FX THV from the transfemoral approach. She was admitted to [facility] on 12/18/23 with a stroke. She was started on Plavix  along with ASA. Echo 12/19/23 with LVEF=65-70%. Normally functioning AVR. She fell out of bed while hospitalized and had a hip fracture requiring surgical repair. GI bleeding while hospitalized and ASA/Plavix  held. She has been restarted on Plavix  and tolerating.   She is here today for follow up. The patient denies any chest pain, dyspnea, palpitations, lower extremity edema, orthopnea, PND, dizziness, near syncope or syncope.   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 Verlin & Dr. Maryjane   Scoliosis    Severe aortic stenosis    Wears glasses     Past Surgical History:  Procedure Laterality Date   CESAREAN SECTION  07/31/1976   TWINS, ONE WAS  STILLBORN   INTRAOPERATIVE TRANSTHORACIC ECHOCARDIOGRAM N/A 11/20/2023   Procedure: ECHOCARDIOGRAM, TRANSTHORACIC;  Surgeon: Verlin Lonni BIRCH, MD;  Location: [facility];  Service: Cardiovascular;  Laterality: N/A;   IR IVC FILTER PLMT / S&I /IMG GUID/MOD SED  12/31/2023   MOUTH SURGERY  07/31/1994   tongue resection   NODULE REMOVED     BENIGN, LOWER NECK   RIGHT HEART CATH AND CORONARY ANGIOGRAPHY N/A 09/21/2023   Procedure: RIGHT HEART CATH AND CORONARY ANGIOGRAPHY;  Surgeon: Verlin Lonni BIRCH, MD;  Location: [facility] CV [facility];  Service: Cardiovascular;  Laterality: N/A;   TOTAL HIP ARTHROPLASTY Right 12/21/2023   Procedure: ARTHROPLASTY, HIP, TOTAL, ANTERIOR APPROACH;  Surgeon: Kendal Franky SQUIBB, MD;  Location: [facility] OR;  Service: Orthopedics;  Laterality: Right;  HEMI HIP    Current Outpatient Medications  Medication Sig Dispense Refill   amLODipine  (NORVASC ) 5 MG tablet Take 1 tablet (5 mg total) by mouth daily. 30 tablet 0   Calcium  Carbonate (CALTRATE 600 PO) Take 1 tablet by mouth daily after lunch.     Cholecalciferol (VITAMIN D3) 10 MCG (400 UNIT) tablet Take 400 Units by mouth daily after lunch.     clopidogrel  (PLAVIX ) 75 MG tablet Take 1 tablet (75 mg total) by mouth daily. 30 tablet 0   dorzolamide -timolol  (COSOPT ) 22.3-6.8 MG/ML ophthalmic solution Place 1 drop into the left eye every 12 (twelve) hours.  levothyroxine  (SYNTHROID ) 75 MCG tablet Take 1 tablet (75 mcg total) by mouth daily after lunch. 30 tablet 0   LUMIGAN 0.01 % SOLN Place 1 drop into the left eye at bedtime.     memantine  (NAMENDA ) 5 MG tablet Take 1 tablet (5 mg total) by mouth at bedtime. 30 tablet 0   metoprolol  succinate (TOPROL -XL) 100 MG 24 hr tablet Take 1 tablet (100 mg total) by mouth daily after lunch. Take with or immediately following a meal. 30 tablet 0   Multiple Vitamin (MULTIVITAMIN WITH MINERALS) TABS tablet Take 1 tablet by mouth daily after lunch. Centrum Silver      polyvinyl alcohol  (ARTIFICIAL TEARS) 1.4 % ophthalmic solution Place 1 drop into both eyes as needed for dry eyes.     prednisoLONE  acetate (PREDNISOLONE  ACETATE P-F) 1 % ophthalmic suspension Place 1 drop into the right eye in the morning.     rosuvastatin  (CRESTOR ) 5 MG tablet Take 1 tablet (5 mg total) by mouth daily after lunch. 30 tablet 0   senna-docusate (SENOKOT-S) 8.6-50 MG tablet Take 1 tablet by mouth 2 (two) times daily between meals as needed for mild constipation.     aspirin  EC 81 MG tablet Take 81 mg by mouth daily. Swallow whole. (Patient not taking: Reported on 02/27/2024)     melatonin 5 MG TABS Take 1 tablet (5 mg total) by mouth at bedtime as needed. (Patient not taking: Reported on 02/27/2024) 30 tablet 0   pantoprazole  (PROTONIX ) 40 MG tablet Take 1 tablet (40 mg total) by mouth 2 (two) times daily. (Patient not taking: Reported on 02/27/2024) 60 tablet 0   No current facility-administered medications for this visit.    Allergies  Allergen Reactions   Brimonidine Itching    Red eyes   Fluocinolone Acetonide Other (See Comments)    Unable to recall   Naphazoline-Polyethyl Glycol     THAT YOU USE TO DILATE YOUR EYES.SABRAMAKES BP GO UP, CAN ONLY USE HALF STRENGTH   Triamcinolone Acetonide Other (See Comments)    Unable to recall   Tropicamide Other (See Comments)    Elevates IOP OK to use 0.5%    Social History   Socioeconomic History   Marital status: Widowed    Spouse name: Not on file   Number of children: 3   Years of education: Not on file   Highest education level: Not on file  Occupational History   Not on file  Tobacco Use   Smoking status: Former    Current packs/day: 0.00    Average packs/day: 0.2 packs/day for 2.0 years (0.4 ttl pk-yrs)    Types: Cigarettes    Start date: 07/31/1992    Quit date: 07/31/1994    Years since quitting: 29.5   Smokeless tobacco: Never   Tobacco comments:    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  Vaping Use   Vaping status: Never  Used  Substance and Sexual Activity   Alcohol  use: No   Drug use: No   Sexual activity: Not on file  Other Topics Concern   Not on file  Social History Narrative   12/28/21 lives alone   Social Drivers of Health   Financial Resource Strain: Not on file  Food Insecurity: No Food Insecurity (12/27/2023)   Hunger Vital Sign    Worried About Running Out of Food in the Last Year: Never true    Ran Out of Food in the Last Year: Never true  Transportation Needs: Patient Unable To Answer (  12/27/2023)   PRAPARE - Transportation    Lack of Transportation (Medical): Patient unable to answer    Lack of Transportation (Non-Medical): Patient unable to answer  Physical Activity: Not on file  Stress: Not on file  Social Connections: Socially Isolated (12/27/2023)   Social Connection and Isolation Panel    Frequency of Communication with Friends and Family: Twice a week    Frequency of Social Gatherings with Friends and Family: More than three times a week    Attends Religious Services: Never    Database administrator or Organizations: No    Attends Banker Meetings: Never    Marital Status: Widowed  Intimate Partner Violence: Not At Risk (12/27/2023)   Humiliation, Afraid, Rape, and Kick questionnaire    Fear of Current or Ex-Partner: No    Emotionally Abused: No    Physically Abused: No    Sexually Abused: No    Family History  Problem Relation Age of Onset   Thyroid  disease Mother    Dementia Mother    Thyroid  disease Sister     Review of Systems:  As stated in the HPI and otherwise negative.   BP (!) 100/52   Pulse 64   Ht 5' 6 (1.676 m)   Wt 124 lb 12.8 oz (56.6 kg)   SpO2 97%   BMI 20.14 kg/m   Physical Examination:  General: Well developed, well nourished, NAD  HEENT: OP clear, mucus membranes moist  SKIN: warm, dry. No rashes. Neuro: No focal deficits  Musculoskeletal: Muscle strength 5/5 all ext  Psychiatric: Mood and affect normal  Neck: No JVD, no  carotid bruits, no thyromegaly, no lymphadenopathy.  Lungs:Clear bilaterally, no wheezes, rhonci, crackles Cardiovascular: Regular rate and rhythm. No murmurs, gallops or rubs. Abdomen:Soft. Bowel sounds present. Non-tender.  Extremities: No lower extremity edema. Pulses are 2 + in the bilateral DP/PT.  EKG:  EKG is not ordered today. The ekg ordered today demonstrates   Recent Labs: 12/24/2023: TSH 9.552 12/27/2023: Magnesium  2.1 12/28/2023: ALT 36 01/10/2024: BUN 17; Creatinine, Ser 0.90; Potassium 4.0; Sodium 140 02/16/2024: Hemoglobin 12.1; Platelets 270   Lipid Panel    Component Value Date/Time   CHOL 131 12/19/2023 0422   TRIG 46 12/19/2023 0422   HDL 77 12/19/2023 0422   CHOLHDL 1.7 12/19/2023 0422   VLDL 9 12/19/2023 0422   LDLCALC 45 12/19/2023 0422     Wt Readings from Last 3 Encounters:  02/27/24 124 lb 12.8 oz (56.6 kg)  02/08/24 125 lb (56.7 kg)  02/05/24 125 lb 9.6 oz (57 kg)    Assessment and Plan:   1. Severe Aortic Valve Stenosis: She is now s/p TAVR in May 2025 and is doing well. The valve is working well by post op day one echo in May 2025. Continue Plavix  (started post CVA). SBE prophylaxis as needed.   2. CAD without angina: Mild CAD by cath February 2025. Continue Plavix  and Crestor .   Labs/ tests ordered today include:   No orders of the defined types were placed in this encounter.  Disposition:   F/U with the structural heart team  Signed, Lonni Cash, MD, [facility] 02/27/2024 11:28 AM    [facility] [address] [place], [place], [place]  [address] Phone: [phone]; Fax: [phone]

## 2024-02-27 NOTE — Patient Instructions (Signed)
 Medication Instructions:  No changes *If you need a refill on your cardiac medications before your next appointment, please call your pharmacy*  Lab Work: none If you have labs (blood work) drawn today and your tests are completely normal, you will receive your results only by: MyChart Message (if you have MyChart) OR A paper copy in the mail If you have any lab test that is abnormal or we need to change your treatment, we will call you to review the results.  Testing/Procedures: none  Follow-Up: At [facility], you and your health needs are our priority.  As part of our continuing mission to provide you with exceptional heart care, our providers are all part of one team.  This team includes your primary Cardiologist (physician) and Advanced Practice Providers or APPs (Physician Assistants and Nurse Practitioners) who all work together to provide you with the care you need, when you need it.  Your next appointment:   12 month(s)  Provider:   Antoinette Batman, MD

## 2024-03-05 ENCOUNTER — Ambulatory Visit: Admitting: Acute Care

## 2024-03-11 ENCOUNTER — Encounter: Payer: Self-pay | Admitting: Acute Care

## 2024-03-11 ENCOUNTER — Ambulatory Visit (INDEPENDENT_AMBULATORY_CARE_PROVIDER_SITE_OTHER): Admitting: Acute Care

## 2024-03-11 VITALS — BP 142/64 | HR 68 | Temp 97.3°F | Ht 66.0 in | Wt 125.8 lb

## 2024-03-11 DIAGNOSIS — R918 Other nonspecific abnormal finding of lung field: Secondary | ICD-10-CM | POA: Diagnosis not present

## 2024-03-11 DIAGNOSIS — Z8673 Personal history of transient ischemic attack (TIA), and cerebral infarction without residual deficits: Secondary | ICD-10-CM

## 2024-03-11 DIAGNOSIS — J479 Bronchiectasis, uncomplicated: Secondary | ICD-10-CM | POA: Diagnosis not present

## 2024-03-11 DIAGNOSIS — T17500A Unspecified foreign body in bronchus causing asphyxiation, initial encounter: Secondary | ICD-10-CM

## 2024-03-11 DIAGNOSIS — Z87891 Personal history of nicotine dependence: Secondary | ICD-10-CM

## 2024-03-11 DIAGNOSIS — R9389 Abnormal findings on diagnostic imaging of other specified body structures: Secondary | ICD-10-CM

## 2024-03-11 DIAGNOSIS — R911 Solitary pulmonary nodule: Secondary | ICD-10-CM

## 2024-03-11 NOTE — Patient Instructions (Addendum)
 It is good to see you today. We have reviewed your most recent Ct chest. This shows the 6 mm nodule in the left  upper lobe is stable in size. There is also an 8 mm nodule in the right lower lobe that we will need to continue to monitor closely. We will do a 3 month follow up CT chest . This will be do around the end of October 2025. You will get a call to get this scheduled.  You also have many areas of mucus that are impacted into your airways.  We will add Mucinex  1200 mg daily to thin your mucus. Take with a full glass of water. We will also give you a flutter valve to use to help to dislodge the mucus from your lungs.  Blow into this 4 -6 times , and do this six times a day or more.  Follow up after CT Chest in 3 months . Call if you need us  sooner, for unintentional weight loss or blood in sputum. Please contact office for sooner follow up if symptoms do not improve or worsen or seek emergency care

## 2024-03-11 NOTE — Progress Notes (Signed)
 History of Present Illness [name] is a 83 y.o. 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 She will be followed by Dr. Shelah   Synopsis Referred for an  incidentally found pulmonary nodule 09/2023 on a cardiac calcium  scoring scan. 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 She had an ischemic CVA and right femoral DVT presenting to the ED on 12/18/2023.She did not receive TNK, but is currently on Plavix . She fell while in the hospital and broke her hip.  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 She appears to be doing remarkably well today considering her recent medical issues. She states she is tired, and that she has trouble keeping up with all that is wrong with her. She is overwhelmed.  .She does have a chronic non productive cough. She denies any weight loss or hemoptysis.    Pt.  has a very remote history of smoking from 1994-1996, about 4 cigarettes a day. 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 Recommendation was for a 3-6 month follow up scan. We opted to do a scan now at 4 months to reassess the nodule, as we had not had a dedicated CT of the Chest..    03/11/2024 Discussed the use of AI scribe software for clinical note transcription with the patient, who gave verbal consent to proceed.  History of Present Illness [name] is an 83 year old female who presents for follow up after dedicated CT chest to re-evaluate a pulmonary nodule initially identified on a Coronary Scoring CT scan.[name]  She was recently seen at Urgent Care and received IV fluids for dehydration. Her oral intake has been poor since this weekend,  with difficulty maintaining adequate fluid intake despite  encouragement to drink more.   We have reviewed her most recent CT chest. She has a history of pulmonary nodules , including a stable six millimeter nodule in the left upper lobe and scattered calcified nodules throughout the right lung related to mucoid impaction. An eight millimeter nodule in the right lower lobe was identified, which was not seen on previous scans , due to limitations of the  prior cardiac CT scan. This appears more solid.   She has scattered nodular changes are noted throughout the right lung which appear to be related to mucoid impaction bronchiectatic bronchial tree this is most noted in the lower lobe. These changes have increased in both size and multiplicity when compared with the prior exam. A more organized 8 mm nodule is noted within the right lower lobe . This previously had a less solid appearance.    She experiences thick secretions due to mucoid impaction in her lungs, which have been problematic. She has memory issues, which make her a difficult historian.No signs of pneumonia or hemoptysis.  The recommendation by radiology is for a 6 month follow up CT chest. I discussed this with the patient's daughter who is her primary care giver.I explained that I would prefer doing a 3 month follow up after treating with Mucinex as a mucolytic, and using flutter valve to see if we can mobilize her secretions.    Test Results: CT Chest 02/20/2024 Thoracic inlet is within normal limits. No hilar or  mediastinal adenopathy is noted. The esophagus is within normal limits.   Lungs/Pleura: Lungs are well aerated bilaterally. Some scarring is noted along the major fissure on the left. Apical pleural and parenchymal scarring is noted. The previously seen 6 mm nodule along the major fissure in the left upper lobe represents a Peri fissural lymph node and is stable from prior exams. Scattered calcified granulomas are noted.  Scattered nodular changes are noted throughout the right  lung which appear to be related to mucoid impaction bronchiectatic bronchial tree this is most noted in the lower lobe. These changes have increased in both size and multiplicity when compared with the prior exam. A more organized 8 mm nodule is noted within the right lower lobe which  previously had a less solid appearance. No effusions are seen.   Upper Abdomen: IVC filter is noted.  Small hiatal hernia is noted.   Musculoskeletal: Degenerative changes of the thoracic spine are seen. No acute rib abnormality is noted.   IMPRESSION: Increasing changes of mucoid impaction particularly within the right lower lobe involving areas of bronchiectasis.   An 8 mm nodule is noted within the right lower lobe. This may also represent changes of mucoid impaction but a more solid appearance is noted on the current exam. Short-term follow-up in 6 months is recommended to assess for stability.   Changes of interval TAVR   Aortic Atherosclerosis (ICD10-I70.0).  10/05/2023 CT Coronary Morph w/ CTA Cor W/ Score with and without contrast Mild Tree-in-bud appearing infiltrates within the left lung base and superior segment of the right upper lobe, likely infectious or inflammatory in etiology. 2. Mild lingular atelectasis with an adjacent 6 mm pulmonary nodule. Non-contrast chest CT at 3-6 months is recommended.    Latest Ref Rng & Units 02/16/2024    5:00 PM 01/10/2024    6:16 AM 01/07/2024    5:13 AM  CBC  WBC 4.0 - 10.5 K/uL 6.6  8.7  7.6   Hemoglobin 12.0 - 15.0 g/dL 87.8  89.3  9.9   Hematocrit 36.0 - 46.0 % 37.7  33.3  30.4   Platelets 150 - 400 K/uL 270  422  446        Latest Ref Rng & Units 01/10/2024    6:16 AM 01/07/2024    5:13 AM 01/03/2024    5:55 AM  BMP  Glucose 70 - 99 mg/dL 99  898  889   BUN 8 - 23 mg/dL 17  21  21    Creatinine 0.44 - 1.00 mg/dL 9.09  9.10  8.95   Sodium 135 - 145 mmol/L 140  137  134   Potassium 3.5 - 5.1 mmol/L 4.0  3.8  3.8   Chloride 98 - 111 mmol/L 107  104   103   CO2 22 - 32 mmol/L 25  24  23    Calcium  8.9 - 10.3 mg/dL 9.2  9.0  8.8     BNP No results found for: BNP  ProBNP No results found for: PROBNP  PFT No results found for: FEV1PRE, FEV1POST, FVCPRE, FVCPOST, TLC, DLCOUNC, PREFEV1FVCRT, PSTFEV1FVCRT  CT CHEST WO CONTRAST Result Date: 02/27/2024 CLINICAL DATA:  Follow-up lung nodule EXAM: CT CHEST WITHOUT CONTRAST TECHNIQUE: Multidetector CT imaging of the chest was performed following the standard protocol without IV contrast. RADIATION DOSE REDUCTION: This exam was performed according to the departmental dose-optimization program which includes automated exposure control, adjustment of the mA and/or kV according to patient size and/or use of iterative reconstruction technique.  COMPARISON:  10/05/2023 FINDINGS: Cardiovascular: Somewhat limited due to lack of IV contrast. Aberrant right subclavian artery is noted. Atherosclerotic calcifications of the aorta are seen without aneurysmal dilatation. Changes of prior TAVR are noted. Coronary calcifications are noted. Heart is stable in size. Mediastinum/Nodes: Thoracic inlet is within normal limits. No hilar or mediastinal adenopathy is noted. The esophagus is within normal limits. Lungs/Pleura: Lungs are well aerated bilaterally. Some scarring is noted along the major fissure on the left. Apical pleural and parenchymal scarring is noted. The previously seen 6 mm nodule along the major fissure in the left upper lobe represents a Peri fissural lymph node and is stable from prior exams. Scattered calcified granulomas are noted. Scattered nodular changes are noted throughout the right lung which appear to be related to mucoid impaction bronchiectatic bronchial tree this is most noted in the lower lobe. These changes have increased in both size and multiplicity when compared with the prior exam. A more organized 8 mm nodule is noted within the right lower lobe best seen on image number 78  of series 3. This previously had a left solid appearance. No effusions are seen. Upper Abdomen: IVC filter is noted.  Small hiatal hernia is noted. Musculoskeletal: Degenerative changes of the thoracic spine are seen. No acute rib abnormality is noted. IMPRESSION: Increasing changes of mucoid impaction particularly within the right lower lobe involving areas of bronchiectasis. An 8 mm nodule is noted within the right lower lobe. This may also represent changes of mucoid impaction but a more solid appearance is noted on the current exam. Short-term follow-up in 6 months is recommended to assess for stability. Changes of interval TAVR Aortic Atherosclerosis (ICD10-I70.0). Electronically Signed   By: Oneil Devonshire M.D.   On: 02/27/2024 19:24   DG Humerus Right Result Date: 02/16/2024 CLINICAL DATA:  Bruising, question injury. EXAM: RIGHT HUMERUS - 2+ VIEW COMPARISON:  None Available. FINDINGS: There is no evidence of fracture or other focal bone lesions. Cortical margins of the humerus are intact. Shoulder and elbow alignment are maintained. No erosions or periostitis. Site of bruising is not well delineated by radiograph. IMPRESSION: Negative radiographs of the right humerus. Electronically Signed   By: Andrea Gasman M.D.   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 Verlin & Dr. Maryjane   Scoliosis    Severe aortic stenosis    Wears glasses      Social History   Tobacco Use   Smoking status: Former    Current packs/day: 0.00    Average packs/day: 0.2 packs/day for 2.0 years (0.4 ttl pk-yrs)    Types:  Cigarettes    Start date: 07/31/1992    Quit date: 07/31/1994    Years since quitting: 29.6   Smokeless tobacco: Never   Tobacco comments:    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  Vaping Use   Vaping status: Never Used  Substance Use Topics   Alcohol  use: No   Drug use: No    Ms.[employer]  reports that she quit smoking about 29 years ago. Her smoking use included cigarettes. She started smoking about 31 years ago. She has a 0.4 pack-year smoking history. She has never used smokeless tobacco. She reports that she does not drink alcohol  and does not use drugs.  Tobacco Cessation: Counseling given: Not Answered Tobacco comments: 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 Former remote smoker ,  Past surgical hx, Family hx, Social hx all reviewed.  Current Outpatient Medications on File Prior to Visit  Medication Sig   amLODipine  (NORVASC ) 5 MG tablet Take 1 tablet (5 mg total) by mouth daily.   aspirin  EC 81 MG tablet Take 81 mg by mouth daily. Swallow whole.   Calcium  Carbonate (CALTRATE 600 PO) Take 1 tablet by mouth daily after lunch.   cefpodoxime (VANTIN) 200 MG tablet Take 200 mg by mouth 2 (two) times daily.   Cholecalciferol (VITAMIN D3) 10 MCG (400 UNIT) tablet Take 400 Units by mouth daily after lunch.   clopidogrel  (PLAVIX ) 75 MG tablet Take 1 tablet (75 mg total) by mouth daily.   dorzolamide -timolol  (COSOPT ) 22.3-6.8 MG/ML ophthalmic solution Place 1 drop into the left eye every 12 (twelve) hours.   levothyroxine  (SYNTHROID ) 75 MCG tablet Take 1 tablet (75 mcg total) by mouth daily after lunch.   LUMIGAN 0.01 % SOLN Place 1 drop into the left eye at bedtime.   melatonin 5 MG TABS Take 1 tablet (5 mg total) by mouth at bedtime as needed.   memantine  (NAMENDA ) 5 MG tablet Take 1 tablet (5 mg total) by mouth at bedtime.   metoprolol  succinate (TOPROL -XL) 100 MG 24 hr tablet Take 1 tablet (100 mg total) by mouth daily after lunch. Take with or immediately following a meal.   Multiple Vitamin (MULTIVITAMIN  WITH MINERALS) TABS tablet Take 1 tablet by mouth daily after lunch. Centrum Silver   pantoprazole  (PROTONIX ) 40 MG tablet Take 1 tablet (40 mg total) by mouth 2 (two) times daily.   prednisoLONE  acetate (PREDNISOLONE  ACETATE P-F) 1 % ophthalmic suspension Place 1 drop into the right eye in the morning.   rosuvastatin  (CRESTOR ) 5 MG tablet Take 1 tablet (5 mg total) by mouth daily after lunch.   senna-docusate (SENOKOT-S) 8.6-50 MG tablet Take 1 tablet by mouth 2 (two) times daily between meals as needed for mild constipation.   polyvinyl alcohol  (ARTIFICIAL TEARS) 1.4 % ophthalmic solution Place 1 drop into both eyes as needed for dry eyes. (Patient not taking: Reported on 03/11/2024)   No current facility-administered medications on file prior to visit.     Allergies  Allergen Reactions   Brimonidine Itching    Red eyes   Fluocinolone Acetonide Other (See Comments)    Unable to recall   Naphazoline-Polyethyl Glycol     THAT YOU USE TO DILATE YOUR EYES.SABRAMAKES BP GO UP, CAN ONLY USE HALF STRENGTH   Triamcinolone Acetonide Other (See Comments)    Unable to recall   Tropicamide Other (See Comments)    Elevates IOP OK to use 0.5%    Review Of Systems:  Constitutional:   No  weight loss, night sweats,  Fevers, chills, +fatigue, or  lassitude.  HEENT:   No headaches,  Difficulty swallowing,  Tooth/dental problems, or  Sore throat,                No sneezing, itching, ear ache, nasal congestion, post nasal drip,   CV:  No chest pain,  Orthopnea, PND, swelling in lower extremities, anasarca, dizziness, palpitations,  syncope.   GI  No heartburn, indigestion, abdominal pain, nausea, vomiting, diarrhea, change in bowel habits, loss of appetite, bloody stools.   Resp: No shortness of breath with exertion or at rest.  No excess mucus, no productive cough,  No non-productive cough,  No coughing up of blood.  No change in color of mucus.  No wheezing.  No chest wall deformity  Skin: no rash or  lesions.  GU: no dysuria, change in color of urine, no urgency or frequency.  No flank pain, no hematuria   MS:  No joint pain or swelling.  + decreased range of motion due to recent hip fracture.  + back pain.  Psych:  + change in mood or affect. No depression or anxiety.  + memory loss.   Vital Signs BP (!) 142/64   Pulse 68   Temp (!) 97.3 F (36.3 C)   Ht 5' 6 (1.676 m)   Wt 125 lb 12.8 oz (57.1 kg)   SpO2 98% Comment: RA  BMI 20.30 kg/m    Physical Exam:  General- No distress,  A&Ox3, pleasant, confused and overwhelmed ENT: No sinus tenderness, TM clear, pale nasal mucosa, no oral exudate,no post nasal drip, no LAN Cardiac: S1, S2, regular rate and rhythm, no murmur Chest: No wheeze/ rales/ dullness; no accessory muscle use, no nasal flaring, no sternal retractions, slightly diminished per bases Abd.: Soft Non-tender, ND, BS +, Body mass index is 20.3 kg/m.  Ext: No clubbing cyanosis, edema, no obvious deformities. Neuro: Physical deconditioning, moving all extremities x 4, alert to self and daughter, poor short-term memory.Right eye droop Skin: No rashes, warm and dry, no obvious skin lesions Psych: normal mood and behavior   Assessment/Plan  Assessment and Plan Assessment & Plan Pulmonary lung nodule  Mucoid impaction and bronchiectasis of the lungs Former remote smoker  Recent Stroke, on Plavix  Plan We have reviewed your most recent Ct chest. This shows the 6 mm nodule in the left  upper lobe is stable in size. There is also an 8 mm nodule in the right lower lobe that we will need to continue to monitor closely. We will do a 3 month follow up CT chest . This will be do around the end of October 2025. You will get a call to get this scheduled.  You also have many areas of mucus that are impacted into your airways.  We will add Mucinex  1200 mg daily to thin your mucus. Take with a full glass of water. We will also give you a flutter valve to use to help to  dislodge the mucus from your lungs.  Blow into this 4 -6 times , and do this six times a day or more.  Follow up after CT Chest in 3 months . Call if you need us  sooner, for unintentional weight loss or blood in sputum. Please contact office for sooner follow up if symptoms do not improve or worsen or seek emergency care     I spent 35 minutes dedicated to the care of this patient on the date of this encounter to include pre-visit review of records, face-to-face time with the patient discussing conditions above, post visit ordering of testing, clinical documentation with the electronic health record, making appropriate referrals as documented, and communicating necessary information to the patient's healthcare team.      Lauraine JULIANNA Lites, NP 03/11/2024  8:27 PM

## 2024-03-21 ENCOUNTER — Ambulatory Visit: Admitting: Adult Health

## 2024-03-21 NOTE — Progress Notes (Signed)
 I agree with the plans as outlined above.   Lamar Chris, MD, PhD 03/21/2024, 6:51 PM [facility] [phone] or if no answer before 7:00PM call [phone] For any issues after 7:00PM please call eLink [phone]

## 2024-03-27 ENCOUNTER — Ambulatory Visit: Admitting: Neurology

## 2024-03-27 ENCOUNTER — Encounter: Payer: Self-pay | Admitting: Neurology

## 2024-03-27 VITALS — BP 134/74 | Ht 66.0 in | Wt 111.0 lb

## 2024-03-27 DIAGNOSIS — F03B18 Unspecified dementia, moderate, with other behavioral disturbance: Secondary | ICD-10-CM

## 2024-03-27 DIAGNOSIS — I639 Cerebral infarction, unspecified: Secondary | ICD-10-CM

## 2024-03-27 DIAGNOSIS — F03B0 Unspecified dementia, moderate, without behavioral disturbance, psychotic disturbance, mood disturbance, and anxiety: Secondary | ICD-10-CM | POA: Insufficient documentation

## 2024-03-27 MED ORDER — CLOPIDOGREL BISULFATE 75 MG PO TABS: 75.0000 mg | ORAL_TABLET | Freq: Every day | ORAL | 0 refills | Status: AC

## 2024-03-27 MED ORDER — MEMANTINE HCL 5 MG PO TABS: 10.0000 mg | ORAL_TABLET | Freq: Two times a day (BID) | ORAL | 11 refills | Status: DC

## 2024-03-27 NOTE — Progress Notes (Addendum)
 Chief Complaint  Patient presents with   Hospitalization Follow-up    RM15- stroke      ASSESSMENT AND PLAN  [name] is a 83 y.o. 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 Rosemarie suggest 30 days cardiac monitoring, daughter declined, with her mother's confusion, she doubts she can comply with the monitor  Encourage moderate exercise increase water intake   DIAGNOSTIC DATA (LABS, IMAGING, TESTING) - I reviewed patient records, labs, notes, testing and imaging myself where available. Lab evaluation from primary care doctor [facility] June 2025, normal CBC hemoglobin of 13, TSH was elevated 14.5, creatinine slight elevation 1.05, vitamin D  45.8, B12 1038  MEDICAL HISTORY:  [name], is a 83 year old female, accompanied by her daughter seen in request by    Clarice Nottingham to follow-up hospital discharge for stroke, initial evaluation March 27, 2024  History is obtained from the patient and review of electronic medical records. I personally reviewed pertinent available imaging films in PACS.   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 Rosemarie at hospital, suspicious for  cardiac embolic event, recommended 30 days cardiac monitoring, she now lives with her daughter, has confusion, daughter doubt that she can complying with cardiac monitoring  She was seen by [facility] Dr. Rush since 2023 for slow worsening memory loss,  She worked warehouse manager job until age 41, gradual onset of memory loss over the past 5 years, now with increased confusion, daughter goes to work during the day, she can stay at home alone, does not wander, [facility]CA examination 21/30, mild gait abnormality, broke her hip, had a right hip replacement,   PHYSICAL EXAM:   Vitals:   03/27/24 1308  BP: 134/74  SpO2: 98%  Weight: 111 lb (50.3 kg)  Height: 5' 6 (1.676 m)   Body mass index is 17.92 kg/m.  PHYSICAL EXAMNIATION:  Gen: NAD, conversant, well nourised, well groomed                     Cardiovascular: Regular rate rhythm, no peripheral edema, warm, nontender. Eyes: Conjunctivae clear without exudates or hemorrhage Neck: Supple, no carotid bruits. Pulmonary: Clear to auscultation bilaterally   NEUROLOGICAL EXAM:  MENTAL STATUS: Speech/cognition: Awake, alert, oriented to history taking and casual conversation    03/27/2024    1:00 PM  MMSE - Mini Mental State Exam  Orientation to time 0  Orientation to Place 3  Registration 3  Attention/ Calculation 5  Recall 1  Language- name 2 objects 2  Language- repeat 1  Language- follow 3 step command 3  Language- read & follow direction 1  Write a sentence 1  Copy design 1  Total score 21    CRANIAL NERVES: CN II: Visual fields are full to  confrontation. 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 No ptosis. CN V: Facial sensation is intact to light touch CN VII: Face is symmetric with normal eye closure  CN VIII: Hearing is normal to causal conversation. CN IX, X: Phonation is normal. CN XI: Head turning and shoulder shrug are intact  MOTOR: There is no pronator drift of  out-stretched arms. Muscle bulk and tone are normal. Muscle strength is normal.  REFLEXES: Reflexes are 1 and symmetric at the biceps, triceps, knees, and ankles. Plantar responses are flexor.  SENSORY: Intact to light touch, pinprick and vibratory sensation are intact in fingers and toes.  COORDINATION: There is no trunk or limb dysmetria noted.  GAIT/STANCE: Push-up, mildly unsteady dragging right leg  REVIEW OF SYSTEMS:  Full 14 system review of systems performed and notable only for as above All other review of systems were negative.   ALLERGIES: Allergies  Allergen Reactions   Brimonidine Itching    Red eyes   Fluocinolone Acetonide Other (See Comments)    Unable to recall   Naphazoline-Polyethyl Glycol     THAT YOU USE TO DILATE YOUR EYES.SABRAMAKES BP GO UP, CAN ONLY USE HALF STRENGTH   Triamcinolone Acetonide Other (See Comments)    Unable to recall   Tropicamide Other (See Comments)    Elevates IOP OK to use 0.5%    HOME MEDICATIONS: Current Outpatient Medications  Medication Sig Dispense Refill   amLODipine  (NORVASC ) 5 MG tablet Take 1 tablet (5 mg total) by mouth daily. 30 tablet 0   aspirin  EC 81 MG tablet Take 81 mg by mouth daily. Swallow whole.     Calcium  Carbonate (CALTRATE 600 PO) Take 1 tablet by mouth daily after lunch.     Cholecalciferol (VITAMIN D3) 10 MCG (400 UNIT) tablet Take 400 Units by mouth daily after lunch.     clopidogrel  (PLAVIX ) 75 MG tablet Take 1 tablet (75 mg total) by mouth daily. 30 tablet 0   dorzolamide -timolol  (COSOPT ) 22.3-6.8 MG/ML ophthalmic solution Place 1 drop into the left eye every 12 (twelve) hours.     levothyroxine  (SYNTHROID ) 75 MCG tablet Take 1 tablet (75 mcg total) by mouth daily after lunch. 30 tablet 0   LUMIGAN 0.01 % SOLN Place 1 drop into the left eye at bedtime.     melatonin 5 MG TABS Take 1 tablet (5 mg total) by mouth at bedtime as needed. 30 tablet 0   memantine  (NAMENDA ) 5 MG tablet Take 1 tablet (5 mg  total) by mouth at bedtime. 30 tablet 0   metoprolol  succinate (TOPROL -XL) 100 MG 24 hr tablet Take 1 tablet (100 mg total) by mouth daily after lunch. Take with or immediately following a meal. 30 tablet 0   Multiple Vitamin (MULTIVITAMIN WITH MINERALS) TABS tablet Take 1 tablet by mouth daily after lunch. Centrum Silver     pantoprazole  (PROTONIX ) 40 MG tablet Take 1 tablet (40 mg total) by mouth 2 (two) times daily. 60 tablet 0   polyvinyl alcohol  (ARTIFICIAL TEARS) 1.4 % ophthalmic solution Place 1 drop into both eyes as needed for dry eyes.     prednisoLONE  acetate (PREDNISOLONE  ACETATE P-F) 1 % ophthalmic suspension Place 1 drop into the right eye in the morning.     rosuvastatin  (CRESTOR ) 5 MG tablet Take 1 tablet (5 mg total) by mouth daily after lunch. 30 tablet 0   senna-docusate (SENOKOT-S) 8.6-50 MG tablet Take 1 tablet by mouth 2 (  two) times daily between meals as needed for mild constipation.     No current facility-administered medications for this visit.    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 Verlin & Dr. Maryjane   Scoliosis    Severe aortic stenosis    Wears glasses     PAST SURGICAL HISTORY: Past Surgical History:  Procedure Laterality Date   CESAREAN SECTION  07/31/1976   TWINS, ONE WAS STILLBORN   INTRAOPERATIVE TRANSTHORACIC ECHOCARDIOGRAM N/A 11/20/2023   Procedure: ECHOCARDIOGRAM, TRANSTHORACIC;  Surgeon: Verlin Lonni BIRCH, MD;  [facility]: [facility];  Service: Cardiovascular;  Laterality: N/A;   IR IVC Grant PLMT / S&I /IMG GUID/MOD SED  12/31/2023   MOUTH SURGERY   07/31/1994   tongue resection   NODULE REMOVED     BENIGN, LOWER NECK   RIGHT HEART CATH AND CORONARY ANGIOGRAPHY N/A 09/21/2023   Procedure: RIGHT HEART CATH AND CORONARY ANGIOGRAPHY;  Surgeon: Verlin Lonni BIRCH, MD;  [facility]: [facility];  Service: Cardiovascular;  Laterality: N/A;   TOTAL HIP ARTHROPLASTY Right 12/21/2023   Procedure: ARTHROPLASTY, HIP, TOTAL, ANTERIOR APPROACH;  Surgeon: Kendal Franky SQUIBB, MD;  Location: [facility] OR;  Service: Orthopedics;  Laterality: Right;  HEMI HIP    FAMILY HISTORY: Family History  Problem Relation Age of Onset   Thyroid  disease Mother    Dementia Mother    Thyroid  disease Sister     SOCIAL HISTORY: Social History   Socioeconomic History   Marital status: Widowed    Spouse name: Not on file   Number of children: 3   Years of education: Not on file   Highest education level: Not on file  Occupational History   Not on file  Tobacco Use   Smoking status: Former    Current packs/day: 0.00    Average packs/day: 0.2 packs/day for 2.0 years (0.4 ttl pk-yrs)    Types: Cigarettes    Start date: 07/31/1992    Quit date: 07/31/1994    Years since quitting: 29.6   Smokeless tobacco: Never   Tobacco comments:    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  Vaping Use   Vaping status: Never Used  Substance and Sexual Activity   Alcohol  use: No   Drug use: No   Sexual activity: Not on file  Other Topics Concern   Not on file  Social History Narrative   12/28/21 lives alone   Social Drivers of Health   Financial Resource Strain: Not on file  Food Insecurity: No Food Insecurity (12/27/2023)   Hunger Vital Sign    Worried About Running Out of Food in the Last Year: Never true    Ran Out of Food in the Last Year: Never true  Transportation Needs: Patient Unable To Answer (12/27/2023)   PRAPARE - Transportation    Lack of Transportation (Medical): Patient unable to answer    Lack of Transportation (Non-Medical): Patient unable to answer  Physical  Activity: Not on file  Stress: Not on file  Social Connections: Socially Isolated (  12/27/2023)   Social Connection and Isolation Panel    Frequency of Communication with Friends and Family: Twice a week    Frequency of Social Gatherings with Friends and Family: More than three times a week    Attends Religious Services: Never    Database Administrator or Organizations: No    Attends Banker Meetings: Never    Marital Status: Widowed  Intimate Partner Violence: Not At Risk (12/27/2023)   Humiliation, Afraid, Rape, and Kick questionnaire    Fear of Current or Ex-Partner: No    Emotionally Abused: No    Physically Abused: No    Sexually Abused: No      Modena Callander, M.D. Ph.D.  [facility] [address], [address] [place], [place] [address] Ph: [phone] Fax: [phone]  CC:  Pegge Toribio PARAS, PA-C [address] [place],  [place] [address]  Clarice Nottingham, MD

## 2024-04-04 ENCOUNTER — Ambulatory Visit: Admitting: Physical Medicine & Rehabilitation

## 2024-04-06 ENCOUNTER — Other Ambulatory Visit: Payer: Self-pay | Admitting: Neurology

## 2024-05-02 ENCOUNTER — Telehealth: Payer: Self-pay | Admitting: Neurology

## 2024-05-02 NOTE — Telephone Encounter (Signed)
 Pt daughter Rosina called to speak to Nurse about PT PCP wants to  prescribe PT Wellbutrin/ Bupropion. However Pt daughter would like to speak to Neurologist to see if its safe for Pt to take this medication with all other medication

## 2024-05-05 NOTE — Telephone Encounter (Signed)
 I reviewed medications that we prescribe which is plavix  and namenda . Wellbutrin and plavix  has a possible interaction in that plavix  may increase wellbutrin. We usually just monitor but she can certainly check with her pharmacist as well.

## 2024-05-07 NOTE — Telephone Encounter (Signed)
 Called [name] and LVM with office number for her to call back.

## 2024-05-07 NOTE — Addendum Note (Signed)
 Addended by: HILLIARD HEATHER CROME on: 05/07/2024 02:14 PM   Modules accepted: Orders

## 2024-05-07 NOTE — Telephone Encounter (Signed)
 Rosina called me back. We discussed the message from [facility] below and she understood. She stated the Wellbutrin was started and pt is now up to 300 mg. I added this to her MAR. I encouraged her to have pt follow closely with PCP to monitor how she does with the Wellbutrin. Daughter also asked for a letter stating patient's use of a walker has nothing to do with her stroke. Daughter said pt fell in the hospital and broke her hip. Prior to, she was not using any assistive devices. She said she discussed with Dr Onita in the office visit but forgot to ask for a letter. States she needs it for personal reasons. I told her this may need to wait until Dr Onita is back in office but if I am able to get it addressed before then, I will let her know.

## 2024-05-08 ENCOUNTER — Encounter: Attending: Registered Nurse | Admitting: Physical Medicine & Rehabilitation

## 2024-05-08 ENCOUNTER — Encounter: Payer: Self-pay | Admitting: Physical Medicine & Rehabilitation

## 2024-05-08 VITALS — BP 144/72 | HR 63 | Ht 66.0 in | Wt 122.4 lb

## 2024-05-08 DIAGNOSIS — R269 Unspecified abnormalities of gait and mobility: Secondary | ICD-10-CM | POA: Insufficient documentation

## 2024-05-08 DIAGNOSIS — F01B0 Vascular dementia, moderate, without behavioral disturbance, psychotic disturbance, mood disturbance, and anxiety: Secondary | ICD-10-CM | POA: Diagnosis not present

## 2024-05-08 DIAGNOSIS — I69398 Other sequelae of cerebral infarction: Secondary | ICD-10-CM | POA: Diagnosis not present

## 2024-05-08 NOTE — Patient Instructions (Addendum)
 Recommend use of rolling walker whenever up and due to poor balance.  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  Recommend neurology and Ortho follow-up Recommend primary care follow-up I do not need to see [name] back in my office.

## 2024-05-08 NOTE — Progress Notes (Signed)
 Subjective:    Patient ID: [name], female    DOB: 1940-11-21, 83 y.o.   MRN: [id number] 83 y.o. right-handed female with history significant for COPD with pulmonary nodule quit smoking 29 years ago osteoporosis severe aortic stenosis status post TAVR 4/25, mild nonobstructive CAD followed by cardiology services, oral cancer tongue resection followed by [facility], diagnosed mild Alzheimer's disease and vascular dementia followed by [facility] neurology.  Hypertension hyperlipidemia and hypothyroidism.  Per chart review patient lives with daughter.  1 level home 3 steps to enter.  Daughter works during the day.  Independent mobility prior to admission with no assistive device will furniture surf and likes to walk to the mailbox to get the mail.  There has been reports of recent falls.  Typically she was able to do some cooking and cleaning.  Daughter manages medications and finances.  Presented 12/18/2023 due to increasing confusion over the past 5 days with associated unsteady gait.  Cranial CT scan negative for acute changes.  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  Underlying age-related cerebral atrophy with moderately advanced chronic microvascular ischemic disease.  CTA with no emergent findings.  Admission chemistries unremarkable except CO2 21 creatinine 1.05 GFR 53 ammonia levels unremarkable urinalysis negative.  Neurology follow-up.  Patient did not receive TNK.  Echocardiogram with ejection fraction of 65 to 70% grade 1 diastolic dysfunction no regional wall motion abnormalities.  Neurology follow-up maintained on aspirin  81 mg daily and Plavix  75 mg daily x 3 weeks then Plavix  alone.  Recommendations of 30-day heart monitor after discharge and order has been placed.  Hospital course complicated by unwitnessed fall while in the hospital 12/19/2023 with no loss of consciousness sustaining a right displaced  femoral neck fracture.  Patient was cleared for surgery by cardiology services.  Underwent right total hip arthroplasty 12/21/2023 per Dr. Kendal advised weightbearing as tolerated.  She did have some postoperative anemia 9.4 transfuse 1 unit 5/26.  Gastroenterology services consulted 12/25/2023 per Dr. Legrand for reported rectal bleeding.  CT angio negative for active bleeding felt bleeding likely secondary to stercoral colitis seen on imaging which was also showing a stool ball in the rectum.  Her Plavix  and aspirin  initially held with hemoglobin stable at 11.  Her aspirin  resumed 12/19/2023 and Plavix  in 3 days beginning 12/29/2023.  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  Urine sodium 97 fluid restriction 1500 cc maintain on sodium chloride  tablet.  Therapy evaluations completed due to patient decreased functional mobility was admitted for a comprehensive rehab programAdmit date: 12/27/2023 Discharge date:01/11/2024 HPI   Stays in her own home with 24/7 supervision  [facility] for PT, OT OT signed off 2 months ago Dresses self without assist Sits on Tub bench, caregiver assist for bathing  Amb with walker without physical assist Discussed reason for decline post hospital stay, both CVA /neuro issues as well as RIght hip fracture .  We also discussed 1 yr mortality of hip fractures in the elderly of ~30%,  Pain Inventory Average Pain 0 Pain Right Now 0 My pain is no pain  In the last 24 hours, has pain interfered with the following? General activity 7 Relation with others 9 Enjoyment of life 8 What TIME of day is your pain at its worst? No pain Sleep (in general) Good  Pain is worse with: no pain Pain improves with: no pain Relief from Meds: no pain  Family History  Problem Relation  Age of Onset   Thyroid  disease Mother    Dementia Mother    Thyroid  disease Sister    Social History   Socioeconomic History   Marital status: Widowed    Spouse name: Not on file    Number of children: 3   Years of education: Not on file   Highest education level: Not on file  Occupational History   Not on file  Tobacco Use   Smoking status: Former    Current packs/day: 0.00    Average packs/day: 0.2 packs/day for 2.0 years (0.4 ttl pk-yrs)    Types: Cigarettes    Start date: 07/31/1992    Quit date: 07/31/1994    Years since quitting: 29.7   Smokeless tobacco: Never   Tobacco comments:    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  Vaping Use   Vaping status: Never Used  Substance and Sexual Activity   Alcohol  use: No   Drug use: No   Sexual activity: Not on file  Other Topics Concern   Not on file  Social History Narrative   12/28/21 lives alone   Social Drivers of Health   Financial Resource Strain: Not on file  Food Insecurity: No Food Insecurity (12/27/2023)   Hunger Vital Sign    Worried About Running Out of Food in the Last Year: Never true    Ran Out of Food in the Last Year: Never true  Transportation Needs: Patient Unable To Answer (12/27/2023)   PRAPARE - Transportation    Lack of Transportation (Medical): Patient unable to answer    Lack of Transportation (Non-Medical): Patient unable to answer  Physical Activity: Not on file  Stress: Not on file  Social Connections: Socially Isolated (12/27/2023)   Social Connection and Isolation Panel    Frequency of Communication with Friends and Family: Twice a week    Frequency of Social Gatherings with Friends and Family: More than three times a week    Attends Religious Services: Never    Database administrator or Organizations: No    Attends Banker Meetings: Never    Marital Status: Widowed   Past Surgical History:  Procedure Laterality Date   CESAREAN SECTION  07/31/1976   TWINS, ONE WAS STILLBORN   INTRAOPERATIVE TRANSTHORACIC ECHOCARDIOGRAM N/A 11/20/2023   Procedure: ECHOCARDIOGRAM, TRANSTHORACIC;  Surgeon: Verlin Lonni BIRCH, MD;  [facility]: [facility];  Service: Cardiovascular;   Laterality: N/A;   IR IVC FILTER PLMT / S&I /IMG GUID/MOD SED  12/31/2023   MOUTH SURGERY  07/31/1994   tongue resection   NODULE REMOVED     BENIGN, LOWER NECK   RIGHT HEART CATH AND CORONARY ANGIOGRAPHY N/A 09/21/2023   Procedure: RIGHT HEART CATH AND CORONARY ANGIOGRAPHY;  Surgeon: Verlin Lonni BIRCH, MD;  [facility]: [facility];  Service: Cardiovascular;  Laterality: N/A;   TOTAL HIP ARTHROPLASTY Right 12/21/2023   Procedure: ARTHROPLASTY, HIP, TOTAL, ANTERIOR APPROACH;  Surgeon: Kendal Franky SQUIBB, MD;  Location: [facility] OR;  Service: Orthopedics;  Laterality: Right;  HEMI HIP   Past Surgical History:  Procedure Laterality Date   CESAREAN SECTION  07/31/1976   TWINS, ONE WAS STILLBORN   INTRAOPERATIVE TRANSTHORACIC ECHOCARDIOGRAM N/A 11/20/2023   Procedure: ECHOCARDIOGRAM, TRANSTHORACIC;  Surgeon: Verlin Lonni BIRCH, MD;  [facility];  Service: Cardiovascular;  Laterality: N/A;   IR IVC FILTER PLMT / S&I /IMG GUID/MOD SED  12/31/2023   MOUTH SURGERY  07/31/1994   tongue resection   NODULE REMOVED  BENIGN, LOWER NECK   RIGHT HEART CATH AND CORONARY ANGIOGRAPHY N/A 09/21/2023   Procedure: RIGHT HEART CATH AND CORONARY ANGIOGRAPHY;  Surgeon: Verlin Lonni BIRCH, MD;  [facility]: [facility];  Service: Cardiovascular;  Laterality: N/A;   TOTAL HIP ARTHROPLASTY Right 12/21/2023   Procedure: ARTHROPLASTY, HIP, TOTAL, ANTERIOR APPROACH;  Surgeon: Kendal Franky SQUIBB, MD;  Location: [facility];  Service: Orthopedics;  Laterality: Right;  HEMI HIP   Past Medical History:  Diagnosis Date   Cataract    COPD (chronic obstructive pulmonary disease) (HCC)    Glaucoma    Goiter    Hypertension 02/21/2018   Hypothyroid    Memory loss    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 Verlin & Dr. Maryjane    Scoliosis    Severe aortic stenosis    Wears glasses    BP (!) 144/72   Pulse 63   Ht 5' 6 (1.676 m)   Wt 122 lb 6.4 oz (55.5 kg)   SpO2 93%   BMI 19.76 kg/m   Opioid Risk Score:   Fall Risk Score:  `1  Depression screen [facility] 2/9     05/08/2024    3:19 PM 02/08/2024    2:38 PM  Depression screen PHQ 2/9  Decreased Interest 1 1  Down, Depressed, Hopeless 1 2  PHQ - 2 Score 2 3  Altered sleeping  0  Tired, decreased energy  1  Change in appetite  1  Feeling bad or failure about yourself   0  Trouble concentrating  0  Moving slowly or fidgety/restless  0  Suicidal thoughts  0  PHQ-9 Score  5     Review of Systems  Musculoskeletal:  Positive for gait problem.  Hematological:  Bruises/bleeds easily.       Plavix   Psychiatric/Behavioral:  Positive for dysphoric mood.        Started welbutrin  All other systems reviewed and are negative.      Objective:   Physical Exam Patient oriented to self and generally to doctors office, does not recall rehabilitation or acute care hospitalization.  Does not recall her hip fracture. Bradykinetic movements, follows simple commands Speech without dysarthria or aphasia Motor strength is 5 -/5 bilateral hip flexor knee extensor ankle dorsiflexor as well as deltoid bicep tricep grip Standing balance is fair, falls toward the left side with feet together eyes open Fine motor intact with finger thumb opposition No dysdiadochokinesis with rapid alternating supination pronation bilateral extremities Finger-nose-finger testing is intact bilaterally         Assessment & Plan:  1.  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  She had hip fracture while on acute care status post hip arthroplasty.  Doing well from that standpoint.  We discussed that her gait disorder is chronic and likely progressive and I recommend continued use of walker even  though she has healed in terms of her hip surgery.  From a functional standpoint she continues to require supervision I would anticipate this will be ongoing basis. I do not think she needs outpatient therapy once home health finishes up Patient will follow-up with Dr. Onita, neurology Patient will follow-up with Dr. Clarice internal medicine Patient will follow-up  with Dr. Kendal orthopedics Physical medicine rehab follow-up on as-needed basis

## 2024-05-12 ENCOUNTER — Ambulatory Visit (INDEPENDENT_AMBULATORY_CARE_PROVIDER_SITE_OTHER): Admitting: Podiatry

## 2024-05-12 ENCOUNTER — Encounter: Payer: Self-pay | Admitting: Podiatry

## 2024-05-12 DIAGNOSIS — M79674 Pain in right toe(s): Secondary | ICD-10-CM

## 2024-05-12 DIAGNOSIS — M79675 Pain in left toe(s): Secondary | ICD-10-CM | POA: Diagnosis not present

## 2024-05-12 DIAGNOSIS — Z7901 Long term (current) use of anticoagulants: Secondary | ICD-10-CM | POA: Diagnosis not present

## 2024-05-12 DIAGNOSIS — B351 Tinea unguium: Secondary | ICD-10-CM | POA: Diagnosis not present

## 2024-05-12 NOTE — Progress Notes (Signed)
  Subjective:  Patient ID: [name], female    DOB: 10/26/1940,   MRN: [id number]  Chief Complaint  Patient presents with   Diabetes Mellitus    diabetic nail care-referred to Dr. Catherin Hives, MD refer.  Bilateral great toe nails very thick and more yellow than other nails. A1c 5.0 date unsure.     83 y.o. female presents for concern of thickened elongated and painful nails that are difficult to trim. Requesting to have them trimmed today. She is on plavix  and at risk for foot care.   PCP:  Hives [place], MD    . Denies any other pedal complaints. Denies n/v/f/c.   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 Verlin & Dr. Maryjane   Scoliosis    Severe aortic stenosis    Wears glasses     Objective:  Physical Exam: Vascular: DP/PT pulses 2/4 bilateral. CFT <3 seconds. Feet cold to touch. Absent hair growth on digits. Edema noted to bilateral lower extremities. Xerosis noted bilaterally.  Skin. No lacerations or abrasions bilateral feet. Nails 1-5 bilateral  are thickened discolored and elongated with subungual debris.  Musculoskeletal: MMT 5/5 bilateral lower extremities in DF, PF, Inversion and Eversion. Deceased ROM in DF of ankle joint.  Neurological: Sensation intact to light touch. Protective sensation  intact  bilateral.    Assessment:   1. Pain due to onychomycosis of toenails of both feet   2. Chronic anticoagulation      Plan:  Patient was evaluated and treated and all questions answered. -Discussed supportive shoes at all times and checking feet regularly.  -Mechanically debrided all nails 1-5 bilateral using  sterile nail nipper and filed with dremel without incident  -Answered all patient questions -Patient to return  in 3 months for at risk foot care -Patient advised to call the office if any problems or questions arise in the meantime.   Asberry Failing, DPM

## 2024-06-02 ENCOUNTER — Encounter: Payer: Self-pay | Admitting: Radiology

## 2024-07-13 ENCOUNTER — Ambulatory Visit (HOSPITAL_BASED_OUTPATIENT_CLINIC_OR_DEPARTMENT_OTHER): Admission: RE | Admit: 2024-07-13

## 2024-07-17 ENCOUNTER — Ambulatory Visit (HOSPITAL_BASED_OUTPATIENT_CLINIC_OR_DEPARTMENT_OTHER)

## 2024-07-19 ENCOUNTER — Ambulatory Visit (HOSPITAL_BASED_OUTPATIENT_CLINIC_OR_DEPARTMENT_OTHER)
Admission: RE | Admit: 2024-07-19 | Discharge: 2024-07-19 | Disposition: A | Discharge status: Home / Self Care | Source: Ambulatory Visit | Attending: Acute Care | Admitting: Acute Care

## 2024-07-19 DIAGNOSIS — R918 Other nonspecific abnormal finding of lung field: Secondary | ICD-10-CM | POA: Insufficient documentation

## 2024-08-08 ENCOUNTER — Telehealth: Payer: Self-pay

## 2024-08-08 NOTE — Telephone Encounter (Signed)
 Copied from CRM #[id number]. Topic: Clinical - Lab/Test Results >> Aug 08, 2024 12:52 PM Rozanna MATSU wrote: Reason for CRM: pt daughter calling about CT results from December, please reach out to her at [phone].   Pt is requesting CT results from 07/19/2024. Please advise.

## 2024-08-12 ENCOUNTER — Encounter: Payer: Self-pay | Admitting: Podiatry

## 2024-08-12 ENCOUNTER — Ambulatory Visit (INDEPENDENT_AMBULATORY_CARE_PROVIDER_SITE_OTHER): Admitting: Podiatry

## 2024-08-12 DIAGNOSIS — B351 Tinea unguium: Secondary | ICD-10-CM | POA: Diagnosis not present

## 2024-08-12 DIAGNOSIS — M79674 Pain in right toe(s): Secondary | ICD-10-CM

## 2024-08-12 DIAGNOSIS — Z7901 Long term (current) use of anticoagulants: Secondary | ICD-10-CM

## 2024-08-12 DIAGNOSIS — M79675 Pain in left toe(s): Secondary | ICD-10-CM | POA: Diagnosis not present

## 2024-08-12 NOTE — Progress Notes (Signed)
 This patient returns to my office for at risk foot care.  This patient requires this care by a professional since this patient will be at risk due to having dementia, CVA and coagulation defect due to plavix .  This patient is unable to cut nails herself since the patient cannot reach her nails.These nails are painful walking and wearing shoes.  This patient presents for at risk foot care today.  General Appearance  Alert, conversant and in no acute stress.  Vascular  Dorsalis pedis and posterior tibial  pulses are palpable  bilaterally.  Capillary return is within normal limits  bilaterally. Temperature is within normal limits  bilaterally.  Neurologic  Senn-Weinstein monofilament wire test within normal limits  bilaterally. Muscle power within normal limits bilaterally.  Nails Thick disfigured discolored nails with subungual debris  from hallux to fifth toes bilaterally. No evidence of bacterial infection or drainage bilaterally.  Orthopedic  No limitations of motion  feet .  No crepitus or effusions noted.  No bony pathology or digital deformities noted.  Skin  normotropic skin with no porokeratosis noted bilaterally.  No signs of infections or ulcers noted.     Onychomycosis  Pain in right toes  Pain in left toes  Consent was obtained for treatment procedures.   Mechanical debridement of nails 1-5  bilaterally performed with a nail nipper.  Filed with dremel without incident.    Return office visit   3 months                  Told patient to return for periodic foot care and evaluation due to potential at risk complications.   Cordella Bold DPM

## 2024-08-15 ENCOUNTER — Other Ambulatory Visit: Payer: Self-pay | Admitting: Acute Care

## 2024-08-15 ENCOUNTER — Other Ambulatory Visit: Payer: Self-pay | Admitting: Adult Health

## 2024-08-15 DIAGNOSIS — R918 Other nonspecific abnormal finding of lung field: Secondary | ICD-10-CM

## 2024-08-15 NOTE — Telephone Encounter (Signed)
 Last refilled by patient on : 08/05/24 Last office visit : 03/27/24 Next office visit : Return if symptoms worsen or fail to improve.

## 2024-08-15 NOTE — Progress Notes (Signed)
 I have called the patient's daughter with the scan results. The scan shows a new focus of peripheral airway impaction and subpleural nodularity anterior right lung. Dominant nodular component measures up to 10 mm diameter. This is likely infectious/inflammatory. We will do a 3 month follow up scan due after 10/17/2024.  I have spoken with her daughter Rosina, and recommended restarting Guaifenesin liquid to help thin secretions, and for the patient to use her flutter valve to mobilize secretions. She has recently moved to an assisted living. Per her daughter she does not get choked while eating or drinking.   There is a new 5 mm nodule that we will reassess on 3 month follow up, and a stable 7 mm nodule.    CT chest has been ordered.    New focus of peripheral airway impaction and subpleural nodularity anterior right lung. Dominant nodular component measures up to 10 mm diameter. This is likely infectious/inflammatory. Consider one of the following in 3 months for both low-risk and high-risk individuals: (a) repeat chest CT, (b) follow-up PET-CT, or (c) tissue sampling. This recommendation follows the consensus statement: Guidelines for Management of Incidental Pulmonary Nodules Detected on CT Images: From the Fleischner Society 2017; Radiology 2017; 284:228-243. 2. New 5 mm left lower lobe pulmonary nodule. Attention on follow-up recommended. 3. Chronic atelectasis/scarring posterior left upper lobe along the fissure is similar with no substantial change in the associated 7 mm nodule. 4. Bronchial wall thickening with basilar predominant advanced bronchiectasis and associated areas of peripheral lower lobe airway impaction. 5. Emphysema (ICD10-J43.9) and Aortic Atherosclerosis (ICD10-170.0)

## 2024-08-20 NOTE — Telephone Encounter (Signed)
 Copied from CRM #[id number]. Topic: Clinical - Lab/Test Results >> Aug 18, 2024  4:41 PM [name] wrote: Reason for CRM: Patient's daughter, [name], would like patient's test results and recommended regimen (flutter valve and mucinex) to be added to MyChart in a message so she can show patient's assisted living facility.  Instructions from LOV with [name] (03/11/2025):  We have reviewed your most recent Ct chest. This shows the 6 mm nodule in the left  upper lobe is stable in size. There is also an 8 mm nodule in the right lower lobe that we will need to continue to monitor closely. We will do a 3 month follow up CT chest . This will be do around the end of October 2025.  You have many areas of mucus that are impacted into your airways.  We will add Mucinex  1200 mg daily to thin your mucus. Take with a full glass of water. We will also give you a flutter valve to use to help to dislodge the mucus from your lungs.  Blow into this 4 -6 times , and do this six times a day or more  Message sent to daughter [name] [facility]) on [address] with information requested.  Nothing further needed.

## 2024-11-11 ENCOUNTER — Ambulatory Visit: Admitting: Podiatry
